# Patient Record
Sex: Male | Born: 1945 | Race: White | Hispanic: No | Smoking: Never smoker
Health system: Southern US, Community
[De-identification: ages and names within clinical notes are randomized; demographics above are authoritative.]

## PROBLEM LIST (undated history)

## (undated) DIAGNOSIS — E782 Mixed hyperlipidemia: Secondary | ICD-10-CM

## (undated) DIAGNOSIS — I10 Essential (primary) hypertension: Secondary | ICD-10-CM

## (undated) DIAGNOSIS — I272 Pulmonary hypertension, unspecified: Secondary | ICD-10-CM

## (undated) DIAGNOSIS — J45909 Unspecified asthma, uncomplicated: Secondary | ICD-10-CM

## (undated) DIAGNOSIS — G4733 Obstructive sleep apnea (adult) (pediatric): Secondary | ICD-10-CM

## (undated) DIAGNOSIS — I209 Angina pectoris, unspecified: Secondary | ICD-10-CM

## (undated) DIAGNOSIS — D869 Sarcoidosis, unspecified: Secondary | ICD-10-CM

## (undated) DIAGNOSIS — K5792 Diverticulitis of intestine, part unspecified, without perforation or abscess without bleeding: Secondary | ICD-10-CM

## (undated) DIAGNOSIS — Z789 Other specified health status: Secondary | ICD-10-CM

## (undated) DIAGNOSIS — K219 Gastro-esophageal reflux disease without esophagitis: Secondary | ICD-10-CM

## (undated) DIAGNOSIS — I2510 Atherosclerotic heart disease of native coronary artery without angina pectoris: Secondary | ICD-10-CM

## (undated) DIAGNOSIS — K5190 Ulcerative colitis, unspecified, without complications: Secondary | ICD-10-CM

## (undated) DIAGNOSIS — G2581 Restless legs syndrome: Secondary | ICD-10-CM

## (undated) DIAGNOSIS — Z87442 Personal history of urinary calculi: Secondary | ICD-10-CM

## (undated) DIAGNOSIS — I219 Acute myocardial infarction, unspecified: Secondary | ICD-10-CM

## (undated) DIAGNOSIS — M1990 Unspecified osteoarthritis, unspecified site: Secondary | ICD-10-CM

## (undated) DIAGNOSIS — I2699 Other pulmonary embolism without acute cor pulmonale: Secondary | ICD-10-CM

## (undated) HISTORY — DX: Other specified health status: Z78.9

## (undated) HISTORY — DX: Ulcerative colitis, unspecified, without complications: K51.90

## (undated) HISTORY — DX: Other pulmonary embolism without acute cor pulmonale: I26.99

## (undated) HISTORY — PX: APPENDECTOMY: SHX54

## (undated) HISTORY — DX: Sarcoidosis, unspecified: D86.9

## (undated) HISTORY — DX: Restless legs syndrome: G25.81

## (undated) HISTORY — DX: Obstructive sleep apnea (adult) (pediatric): G47.33

## (undated) HISTORY — PX: JOINT REPLACEMENT: SHX530

## (undated) HISTORY — DX: Atherosclerotic heart disease of native coronary artery without angina pectoris: I25.10

## (undated) HISTORY — PX: CARDIAC CATHETERIZATION: SHX172

## (undated) HISTORY — DX: Mixed hyperlipidemia: E78.2

## (undated) HISTORY — DX: Pulmonary hypertension, unspecified: I27.20

---

## 1968-08-21 HISTORY — PX: CHOLECYSTECTOMY: SHX55

## 1983-08-22 HISTORY — PX: OTHER SURGICAL HISTORY: SHX169

## 2003-08-22 HISTORY — PX: OTHER SURGICAL HISTORY: SHX169

## 2011-08-22 HISTORY — PX: LAPAROSCOPIC APPENDECTOMY: SUR753

## 2012-09-20 ENCOUNTER — Encounter: Payer: Self-pay | Admitting: Cardiology

## 2012-10-04 ENCOUNTER — Ambulatory Visit: Payer: Self-pay | Admitting: Cardiology

## 2012-10-10 ENCOUNTER — Encounter: Payer: Self-pay | Admitting: Cardiology

## 2012-10-10 ENCOUNTER — Encounter: Payer: Self-pay | Admitting: *Deleted

## 2012-10-10 ENCOUNTER — Ambulatory Visit (INDEPENDENT_AMBULATORY_CARE_PROVIDER_SITE_OTHER): Payer: Medicare Other | Admitting: Cardiology

## 2012-10-10 VITALS — BP 134/84 | HR 58 | Ht 72.0 in | Wt 224.0 lb

## 2012-10-10 DIAGNOSIS — I2510 Atherosclerotic heart disease of native coronary artery without angina pectoris: Secondary | ICD-10-CM | POA: Insufficient documentation

## 2012-10-10 DIAGNOSIS — E782 Mixed hyperlipidemia: Secondary | ICD-10-CM

## 2012-10-10 NOTE — Assessment & Plan Note (Signed)
History reviewed. He reports interval symptoms suggestive of progressive angina. ECG reviewed. Plan an exercise Myoview for ischemic evaluation on medical therapy. Can then determine next step.

## 2012-10-10 NOTE — Progress Notes (Signed)
Clinical Summary [name] is a 67 y.o.male referred for cardiology evaluation by Richard Webb. He was perviously followed by Richard Webb in [place], last seen July 2013. Cardiac history reviewed and noted below. He reports compliance with his medications, no nitroglycerine use. Works actively as a Theme park manager.  He reports intermittent, but more frequent and pronounced than in the last few years, exertional fatigue and breathlessness, particularly with inclines. Had limiting symptoms while shoveling snow last week.  ECG today shows sinus bradycardia, no significant ST changes compared to prior tracing from 10/11. Lipids reviewed from July 2013 with LDL 83, HDL 54, TG 79 and cholesterol 153.  He has not undergone any interval stress testing.  No Known Allergies  Current Outpatient Prescriptions  Medication Sig Dispense Refill  . acetaminophen (TYLENOL) 500 MG tablet Take 500 mg by mouth every 6 (six) hours as needed for pain.      Marland Kitchen ALPRAZolam (XANAX) 0.5 MG tablet Take 0.5 mg by mouth at bedtime as needed.      Marland Kitchen aspirin 81 MG tablet Take 81 mg by mouth daily.      . balsalazide (COLAZAL) 750 MG capsule Take 750 mg by mouth 3 (three) times daily.       . carbidopa-levodopa (SINEMET IR) 10-100 MG per tablet Take 1 tablet by mouth daily.       . clopidogrel (PLAVIX) 75 MG tablet Take 75 mg by mouth daily.      Marland Kitchen dicyclomine (BENTYL) 10 MG capsule Take 1 capsule by mouth 4 (four) times daily.      . diphenoxylate-atropine (LOMOTIL) 2.5-0.025 MG per tablet Take 1 tablet by mouth 4 (four) times daily as needed.      Marland Kitchen EPINEPHrine (EPIPEN JR) 0.15 MG/0.3ML injection Inject 0.15 mg into the muscle as needed.      . fluticasone (FLONASE) 50 MCG/ACT nasal spray Place 2 sprays into the nose daily.      . Fluticasone-Salmeterol (ADVAIR) 250-50 MCG/DOSE AEPB Inhale 1 puff into the lungs every 12 (twelve) hours.      . folic acid (FOLVITE) 712 MCG tablet Take 400 mcg by mouth daily.      Marland Kitchen gabapentin  (NEURONTIN) 300 MG capsule Take 300 mg by mouth 3 (three) times daily.      Marland Kitchen HYDROcodone-acetaminophen (VICODIN) 5-500 MG per tablet Take 1 tablet by mouth daily as needed.      Marland Kitchen ipratropium-albuterol (DUONEB) 0.5-2.5 (3) MG/3ML SOLN Take 3 mLs by nebulization 2 (two) times daily.      Marland Kitchen lisinopril (PRINIVIL,ZESTRIL) 10 MG tablet Take 10 mg by mouth daily.      . montelukast (SINGULAIR) 10 MG tablet Take 10 mg by mouth at bedtime.      . NON FORMULARY CPAP UAD      . simvastatin (ZOCOR) 40 MG tablet Take 40 mg by mouth every evening.      . zolpidem (AMBIEN) 10 MG tablet Take 0.5-1 tablets by mouth at bedtime.       No current facility-administered medications for this visit.    Past Medical History  Diagnosis Date  . Coronary atherosclerosis of native coronary artery     DES x 3 RCA and DES OM 10/11 - [place]  . Mixed hyperlipidemia   . Ulcerative colitis   . Obstructive sleep apnea   . Sarcoidosis   . Restless leg syndrome   . Pulmonary hypertension     PASP 50 mmHg    Past Surgical History  Procedure Laterality  Date  . Right total knee replacement  2005  . Cholecystectomy  1970  . Laparoscopic appendectomy  2013  . Ankle fracture repair  1985    Family History  Problem Relation Age of Onset  . Colon cancer Mother   . CAD Father     Social History Mr. Webb reports that he has never smoked. He does not have any smokeless tobacco history on file. Richard Webb reports that he does not drink alcohol.  Review of Systems No palpitations or syncope. No claudication. Recent cough, no fevers or chills. Otherwise negative.  Physical Examination Filed Vitals:   10/10/12 1431  BP: 134/84  Pulse: 58   Filed Weights   10/10/12 1431  Weight: 224 lb (101.606 kg)   No acute distress. HEENT: Conjunctiva and lids normal, oropharynx clear with moist mucosa. Neck: Supple, no elevated JVP or carotid bruits, no thyromegaly. Lungs: Clear to auscultation, nonlabored  breathing at rest. Cardiac: Regular rate and rhythm, no S3, soft systolic murmur, no pericardial rub. Abdomen: Soft, nontender, bowel sounds present, no guarding or rebound. Extremities: No pitting edema, distal pulses 2+. Skin: Warm and dry. Musculoskeletal: No kyphosis. Neuropsychiatric: Alert and oriented x3, affect grossly appropriate.   Problem List and Plan   Coronary atherosclerosis of native coronary artery History reviewed. He reports interval symptoms suggestive of progressive angina. ECG reviewed. Plan an exercise Myoview for ischemic evaluation on medical therapy. Can then determine next step.  Mixed hyperlipidemia Looks to be well controlled on statin therapy.    Richard Webb, M.D., F.A.C.C.

## 2012-10-10 NOTE — Assessment & Plan Note (Addendum)
Looks to be well controlled on statin therapy.

## 2012-10-10 NOTE — Patient Instructions (Addendum)
Your physician recommends that you schedule a follow-up appointment in: 6 months. You will receive a reminder letter in the mail in about 4 months reminding you to call and schedule your appointment. If you don't receive this letter, please contact our office. Your physician recommends that you continue on your current medications as directed. Please refer to the Current Medication list given to you today. Your physician has requested that you have en exercise stress myoview. For further information please visit [email or web address]. Please follow instruction sheet, as given.

## 2012-10-16 ENCOUNTER — Ambulatory Visit (HOSPITAL_COMMUNITY): Payer: Medicare Other | Attending: Cardiology | Admitting: Radiology

## 2012-10-16 VITALS — BP 123/78 | Ht 72.0 in | Wt 221.0 lb

## 2012-10-16 DIAGNOSIS — J45909 Unspecified asthma, uncomplicated: Secondary | ICD-10-CM | POA: Insufficient documentation

## 2012-10-16 DIAGNOSIS — R0789 Other chest pain: Secondary | ICD-10-CM | POA: Insufficient documentation

## 2012-10-16 DIAGNOSIS — R0989 Other specified symptoms and signs involving the circulatory and respiratory systems: Secondary | ICD-10-CM | POA: Insufficient documentation

## 2012-10-16 DIAGNOSIS — D869 Sarcoidosis, unspecified: Secondary | ICD-10-CM | POA: Insufficient documentation

## 2012-10-16 DIAGNOSIS — R0602 Shortness of breath: Secondary | ICD-10-CM | POA: Insufficient documentation

## 2012-10-16 DIAGNOSIS — I2510 Atherosclerotic heart disease of native coronary artery without angina pectoris: Secondary | ICD-10-CM

## 2012-10-16 DIAGNOSIS — I2789 Other specified pulmonary heart diseases: Secondary | ICD-10-CM | POA: Insufficient documentation

## 2012-10-16 DIAGNOSIS — E785 Hyperlipidemia, unspecified: Secondary | ICD-10-CM | POA: Insufficient documentation

## 2012-10-16 DIAGNOSIS — R0609 Other forms of dyspnea: Secondary | ICD-10-CM | POA: Insufficient documentation

## 2012-10-16 MED ORDER — TECHNETIUM TC 99M SESTAMIBI GENERIC - CARDIOLITE
30.0000 | Freq: Once | INTRAVENOUS | Status: AC | PRN
  Administered 2012-10-16: 30 via INTRAVENOUS

## 2012-10-16 MED ORDER — TECHNETIUM TC 99M SESTAMIBI GENERIC - CARDIOLITE
10.0000 | Freq: Once | INTRAVENOUS | Status: AC | PRN
  Administered 2012-10-16: 10 via INTRAVENOUS

## 2012-10-16 NOTE — Progress Notes (Signed)
[facility] [address] [place], [place] [address] [phone]    Cardiology Nuclear Med Study  Richard Webb is a 67 y.o. male     MRN : [id number]     DOB: Apr 06, 1946  Procedure Date: 10/16/2012  Nuclear Med Background Indication for Stress Test:  Evaluation for Ischemia and Stent Patency History:  Asthma and '11 Heart Cath-Stent-RCAx3/OMx1, Sarcoidois, Pulmonary HTN Cardiac Risk Factors: Lipids  Symptoms:  Chest Pain, DOE, Fatigue and SOB   Nuclear Pre-Procedure Caffeine/Decaff Intake:  None NPO After: 6:00pm   Lungs:  clear O2 Sat: 98% on room air. IV 0.9% NS with Angio Cath:  20g  IV Site: R Hand  IV Started by:  Annye Rusk, CNMT  Chest Size (in):  48 Cup Size: n/a  Height: 6' (1.829 m)  Weight:  221 lb (100.245 kg)  BMI:  Body mass index is 29.97 kg/(m^2). Tech Comments:  Took all am meds    Nuclear Med Study 1 or 2 day study: 1 day  Stress Test Type:  Stress  Reading MD: Darlin Coco, MD  Order Authorizing Provider:  Myles Gip, MD  Resting Radionuclide: Technetium 79mSestamibi  Resting Radionuclide Dose: 11.0 mCi   Stress Radionuclide:  Technetium 975mestamibi  Stress Radionuclide Dose: 33.0 mCi           Stress Protocol Rest HR: 56 Stress HR: 141  Rest BP: 123/78 Stress BP: 197/70  Exercise Time (min): 5:45 METS: 7.0   Predicted Max HR: 154 bpm % Max HR: 91.56 bpm Rate Pressure Product: 27777   Dose of Adenosine (mg):  n/a Dose of Lexiscan: n/a mg  Dose of Atropine (mg): n/a Dose of Dobutamine: n/a mcg/kg/min (at max HR)  Stress Test Technologist: SaPerrin MalteseEMT-P  Nuclear Technologist:  StCharlton AmorCNMT     Rest Procedure:  Myocardial perfusion imaging was performed at rest 45 minutes following the intravenous administration of Technetium 9933mstamibi. Rest ECG: NSR - Normal EKG  Stress Procedure:  The patient exercised on the treadmill utilizing the Bruce Protocol for 5:45 minutes. The patient  stopped due to sob, fatigue, and denied any chest pain.  Technetium 22m12mtamibi was injected at peak exercise and myocardial perfusion imaging was performed after a brief delay. Stress ECG: No significant change from baseline ECG  QPS Raw Data Images:  Normal; no motion artifact; normal heart/lung ratio. Stress Images:  Normal homogeneous uptake in all areas of the myocardium. Rest Images:  Normal homogeneous uptake in all areas of the myocardium. Subtraction (SDS):  No evidence of ischemia. Transient Ischemic Dilatation (Normal <1.22):  0.98 Lung/Heart Ratio (Normal <0.45):  0.19  Quantitative Gated Spect Images QGS EDV:  100 ml QGS ESV:  42 ml  Impression Exercise Capacity:  Fair exercise capacity. BP Response:  Normal blood pressure response. Clinical Symptoms:  No chest pain. ECG Impression:  No significant ST segment change suggestive of ischemia. Comparison with Prior Nuclear Study: No images to compare  Overall Impression:  Normal stress nuclear study.  LV Ejection Fraction: 58%.  LV Wall Motion:  NL LV Function; NL Wall Motion  [employer]

## 2012-10-18 ENCOUNTER — Ambulatory Visit: Payer: Self-pay | Admitting: Cardiology

## 2012-10-18 ENCOUNTER — Telehealth: Payer: Self-pay | Admitting: *Deleted

## 2012-10-18 NOTE — Telephone Encounter (Signed)
Patient informed. 

## 2012-10-18 NOTE — Telephone Encounter (Signed)
Message copied by Merlene Laughter on Fri Oct 18, 2012  4:24 PM ------      Message from: MCDOWELL, Aloha Gell      Created: Thu Oct 17, 2012  9:19 AM       Conclusions of study copied below. No evidence of ischemia, argues against any major progression in underlying CAD. Please let him know. Can continue observation on medical therapy for now, although if his symptoms progress, we should see him back and discuss other options.            Impression      Exercise Capacity:  Fair exercise capacity.      BP Response:  Normal blood pressure response.      Clinical Symptoms:  No chest pain.      ECG Impression:  No significant ST segment change suggestive of ischemia.      Comparison with Prior Nuclear Study: No images to compare            Overall Impression:  Normal stress nuclear study.            LV Ejection Fraction: 58%.  LV Wall Motion:  NL LV Function; NL Wall Motion            Darlin Coco       ------

## 2012-10-21 ENCOUNTER — Encounter: Payer: Self-pay | Admitting: Cardiology

## 2012-12-30 ENCOUNTER — Telehealth: Payer: Self-pay | Admitting: Cardiology

## 2012-12-30 NOTE — Telephone Encounter (Signed)
Patient saw orthopedic MD whom was Dr. Case in [place] for joint pain. Dr. Case requested patient notify his cardiologist to see if it was okay for him to start celebrex. Please advise.

## 2012-12-30 NOTE — Telephone Encounter (Signed)
PATIENT would like to know if he can start on celebrex

## 2012-12-30 NOTE — Telephone Encounter (Signed)
Chart reviewed. Pt cleared to proceed with treatment with Celebrex.

## 2012-12-31 NOTE — Telephone Encounter (Signed)
Patient informed. 

## 2013-03-07 ENCOUNTER — Encounter: Payer: Self-pay | Admitting: Cardiology

## 2013-03-07 ENCOUNTER — Ambulatory Visit (INDEPENDENT_AMBULATORY_CARE_PROVIDER_SITE_OTHER): Payer: Medicare Other | Admitting: Cardiology

## 2013-03-07 VITALS — BP 116/79 | HR 78 | Ht 72.0 in | Wt 222.0 lb

## 2013-03-07 DIAGNOSIS — I2510 Atherosclerotic heart disease of native coronary artery without angina pectoris: Secondary | ICD-10-CM

## 2013-03-07 DIAGNOSIS — E782 Mixed hyperlipidemia: Secondary | ICD-10-CM

## 2013-03-07 MED ORDER — NITROGLYCERIN 0.4 MG SL SUBL: 0.4000 mg | SUBLINGUAL_TABLET | SUBLINGUAL | Status: DC | PRN

## 2013-03-07 NOTE — Assessment & Plan Note (Signed)
Has had intolerance to statins. He is trying red yeast rice and will be following up with Dr. Manuella Ghazi soon.

## 2013-03-07 NOTE — Assessment & Plan Note (Signed)
Continues to experience dyspnea on exertion, some apparent angina symptoms. His Myoview from last year was reassuring. We discussed options today, plan will be to continue observation with closer office followup, prescription for sublingual nitroglycerin also given. Certainly if he notices an escalating pattern in his symptoms, would consider a followup cardiac catheterization. He will continue on DAPT with history of multiple stents in 2011.

## 2013-03-07 NOTE — Progress Notes (Signed)
Clinical Summary Mr. Bedwell is a 67 y.o.male last seen in February. He reports compliance with his medications, still works as actively as a Company secretary. He continues to describe dyspnea on exertion, perhaps some angina with increased levels of activity. We discussed this quite a bit today, he does not relate any definite progression in symptoms however. He has not tried nitroglycerin.  Exercise Myoview from February of this year demonstrated no abnormal ST segment changes, normal perfusion, LVEF 58%. We reviewed this.  He has not been able to tolerate statins, now taking red yeast rice. Followup scheduled with Dr. Manuella Ghazi.  He continues CPAP at nighttime. Still has daytime somnolence.   Allergies  Allergen Reactions  . Crestor (Rosuvastatin) Other (See Comments)    myalgia  . Simvastatin Other (See Comments)    myalgia    Current Outpatient Prescriptions  Medication Sig Dispense Refill  . acetaminophen (TYLENOL) 500 MG tablet Take 500 mg by mouth every 6 (six) hours as needed for pain.      Marland Kitchen ALPRAZolam (XANAX) 0.5 MG tablet Take 0.5 mg by mouth at bedtime as needed.      Marland Kitchen aspirin 81 MG tablet Take 81 mg by mouth daily.      . balsalazide (COLAZAL) 750 MG capsule Take 2,250 mg by mouth 3 (three) times daily.       . carbidopa-levodopa (SINEMET IR) 10-100 MG per tablet Take 1 tablet by mouth daily.       . clopidogrel (PLAVIX) 75 MG tablet Take 75 mg by mouth daily.      Marland Kitchen dicyclomine (BENTYL) 10 MG capsule Take 1 capsule by mouth 4 (four) times daily as needed.       . diphenoxylate-atropine (LOMOTIL) 2.5-0.025 MG per tablet Take 1 tablet by mouth 4 (four) times daily as needed.      Marland Kitchen EPINEPHrine (EPIPEN JR) 0.15 MG/0.3ML injection Inject 0.15 mg into the muscle as needed.      . fluticasone (FLONASE) 50 MCG/ACT nasal spray Place 2 sprays into the nose daily.      . Fluticasone-Salmeterol (ADVAIR) 250-50 MCG/DOSE AEPB Inhale 1 puff into the lungs every 12 (twelve) hours.      .  folic acid (FOLVITE) 081 MCG tablet Take 400 mcg by mouth daily.      Marland Kitchen gabapentin (NEURONTIN) 300 MG capsule Take 300 mg by mouth 3 (three) times daily.      Marland Kitchen HYDROcodone-acetaminophen (VICODIN) 5-500 MG per tablet Take 1 tablet by mouth daily as needed.      Marland Kitchen ipratropium-albuterol (DUONEB) 0.5-2.5 (3) MG/3ML SOLN Take 3 mLs by nebulization 2 (two) times daily.      Marland Kitchen levalbuterol (XOPENEX HFA) 45 MCG/ACT inhaler Inhale 2 puffs into the lungs every 6 (six) hours as needed for wheezing.      Marland Kitchen lisinopril (PRINIVIL,ZESTRIL) 10 MG tablet Take 10 mg by mouth daily.      . methocarbamol (ROBAXIN) 500 MG tablet Take 1 tablet by mouth 2 (two) times daily.      . montelukast (SINGULAIR) 10 MG tablet Take 10 mg by mouth at bedtime.      . NON FORMULARY CPAP UAD      . Red Yeast Rice 600 MG CAPS Take 1,200 mg by mouth daily.      Marland Kitchen zolpidem (AMBIEN) 10 MG tablet Take 10 mg by mouth at bedtime as needed.       . nitroGLYCERIN (NITROSTAT) 0.4 MG SL tablet Place 1 tablet (0.4 mg total) under  the tongue every 5 (five) minutes as needed for chest pain.  90 tablet  3   No current facility-administered medications for this visit.    Past Medical History  Diagnosis Date  . Coronary atherosclerosis of native coronary artery     DES x 3 RCA and DES OM 10/11 - [place]  . Mixed hyperlipidemia   . Ulcerative colitis   . Obstructive sleep apnea   . Sarcoidosis   . Restless leg syndrome   . Pulmonary hypertension     PASP 50 mmHg    Social History Mr. Blyden reports that he has never smoked. He has never used smokeless tobacco. Mr. Hyson reports that he does not drink alcohol.  Review of Systems No palpitations, dizziness, syncope. Occasional bruising, no major bleeding problems. He has been trying to lose some weight through diet. States he has lost about 6 or 8 pounds. Otherwise negative.  Physical Examination Filed Vitals:   03/07/13 1302  BP: 116/79  Pulse: 78   Filed Weights    03/07/13 1302  Weight: 222 lb (100.699 kg)    Comfortable at rest. HEENT: Conjunctiva and lids normal, oropharynx clear with moist mucosa.  Neck: Supple, no elevated JVP or carotid bruits, no thyromegaly.  Lungs: Clear to auscultation, nonlabored breathing at rest.  Cardiac: Regular rate and rhythm, no S3, soft systolic murmur, no pericardial rub.  Abdomen: Soft, nontender, bowel sounds present, no guarding or rebound.  Extremities: No pitting edema, distal pulses 2+.  Skin: Warm and dry.  Musculoskeletal: No kyphosis.  Neuropsychiatric: Alert and oriented x3, affect grossly appropriate.   Problem List and Plan   Coronary atherosclerosis of native coronary artery Continues to experience dyspnea on exertion, some apparent angina symptoms. His Myoview from last year was reassuring. We discussed options today, plan will be to continue observation with closer office followup, prescription for sublingual nitroglycerin also given. Certainly if he notices an escalating pattern in his symptoms, would consider a followup cardiac catheterization. He will continue on DAPT with history of multiple stents in 2011.  Mixed hyperlipidemia Has had intolerance to statins. He is trying red yeast rice and will be following up with Dr. Manuella Ghazi soon.    Satira Sark, M.D., F.A.C.C.

## 2013-03-07 NOTE — Patient Instructions (Addendum)
Your physician recommends that you schedule a follow-up appointment in: 3 months. You will receive a reminder letter in the mail in about 1-2 months reminding you to call and schedule your appointment. If you don't receive this letter, please contact our office. Your physician recommends that you continue on your current medications as directed. Please refer to the Current Medication list given to you today. Dissolve one under tongue for chest pain every 5 minutes up to 3 doses. If no relief, proceed to ED.

## 2013-03-26 ENCOUNTER — Other Ambulatory Visit: Payer: Self-pay

## 2013-06-26 ENCOUNTER — Other Ambulatory Visit: Payer: Self-pay

## 2014-04-16 ENCOUNTER — Encounter: Payer: Self-pay | Admitting: Cardiology

## 2014-04-16 ENCOUNTER — Ambulatory Visit (INDEPENDENT_AMBULATORY_CARE_PROVIDER_SITE_OTHER): Payer: Medicare Other | Admitting: Cardiology

## 2014-04-16 VITALS — BP 125/75 | HR 72 | Ht 72.0 in | Wt 211.0 lb

## 2014-04-16 DIAGNOSIS — I2510 Atherosclerotic heart disease of native coronary artery without angina pectoris: Secondary | ICD-10-CM

## 2014-04-16 DIAGNOSIS — E782 Mixed hyperlipidemia: Secondary | ICD-10-CM

## 2014-04-16 NOTE — Patient Instructions (Signed)
Your physician recommends that you schedule a follow-up appointment in: 6 months. You will receive a reminder letter in the mail in about 4 months reminding you to call and schedule your appointment. If you don't receive this letter, please contact our office. Your physician has recommended you make the following change in your medication:  Stop plavix. Continue all other medications the same.

## 2014-04-16 NOTE — Progress Notes (Signed)
Clinical Summary Richard Webb is a 68 y.o.male recently seen in July. He presents today to discuss his Plavix. He has been on this long-term with history of multiple stents placed back in 2011 in [place]. He is reporting problems with easy bruising and bleeding when he scratches his skin, stopped the Plavix and the symptoms improved. We discussed the pros and cons of dual antiplatelet therapy, he is certainly over one year out, has not had any acute event since then.  He has also had some episodes of intermittent dizziness, usually when standing, told that he was noted to be mildly orthostatic by Dr. Manuella Ghazi, and the dose of lisinopril was cut in half. He is starting to feel better. He has also been watching his diet and his weight has come down nicely. He is actually 10 pounds down from his last visit in July. Recent lipid panel showed cholesterol 148, triglycerides 101, HDL 40, and LDL 88.  Exercise Myoview from February of this year demonstrated no abnormal ST segment changes, normal perfusion, LVEF 58%.   Allergies  Allergen Reactions  . Crestor [Rosuvastatin] Other (See Comments)    myalgia  . Simvastatin Other (See Comments)    myalgia    Current Outpatient Prescriptions  Medication Sig Dispense Refill  . acetaminophen (TYLENOL) 500 MG tablet Take 500 mg by mouth every 6 (six) hours as needed for pain.      Marland Kitchen aspirin 81 MG tablet Take 81 mg by mouth daily.      . carbidopa-levodopa (SINEMET IR) 10-100 MG per tablet Take 1 tablet by mouth as needed.       . dicyclomine (BENTYL) 10 MG capsule Take 1 capsule by mouth 4 (four) times daily as needed.       . diphenoxylate-atropine (LOMOTIL) 2.5-0.025 MG per tablet Take 1 tablet by mouth as needed.       Marland Kitchen EPINEPHrine (EPIPEN JR) 0.15 MG/0.3ML injection Inject 0.15 mg into the muscle as needed.      . Fluticasone-Salmeterol (ADVAIR) 250-50 MCG/DOSE AEPB Inhale 1 puff into the lungs every 12 (twelve) hours.      Marland Kitchen  HYDROcodone-acetaminophen (VICODIN) 5-500 MG per tablet Take 1 tablet by mouth daily as needed.      Marland Kitchen ipratropium-albuterol (DUONEB) 0.5-2.5 (3) MG/3ML SOLN Take 3 mLs by nebulization 2 (two) times daily.      Marland Kitchen lisinopril (PRINIVIL,ZESTRIL) 10 MG tablet Take 10 mg by mouth daily.      . mesalamine (LIALDA) 1.2 G EC tablet Take 1.2 g by mouth 2 (two) times daily.      . methocarbamol (ROBAXIN) 500 MG tablet Take 1 tablet by mouth 2 (two) times daily.      . montelukast (SINGULAIR) 10 MG tablet Take 10 mg by mouth at bedtime.      . nitroGLYCERIN (NITROSTAT) 0.4 MG SL tablet Place 1 tablet (0.4 mg total) under the tongue every 5 (five) minutes as needed for chest pain.  90 tablet  3  . NON FORMULARY CPAP UAD      . temazepam (RESTORIL) 15 MG capsule Take 15 mg by mouth daily.      . Vitamin D, Ergocalciferol, (DRISDOL) 50000 UNITS CAPS capsule Take 50,000 Units by mouth every 7 (seven) days.      Marland Kitchen levalbuterol (XOPENEX HFA) 45 MCG/ACT inhaler Inhale 2 puffs into the lungs every 6 (six) hours as needed for wheezing.       No current facility-administered medications for this visit.  Past Medical History  Diagnosis Date  . Coronary atherosclerosis of native coronary artery     DES x 3 RCA and DES OM 10/11 - [place]  . Mixed hyperlipidemia   . Ulcerative colitis   . Obstructive sleep apnea   . Sarcoidosis   . Restless leg syndrome   . Pulmonary hypertension     PASP 50 mmHg    Social History Richard Webb reports that he has never smoked. He has never used smokeless tobacco. Richard Webb reports that he does not drink alcohol.  Review of Systems Other systems reviewed and negative except as outlined.  Physical Examination Filed Vitals:   04/16/14 0940  BP: 125/75  Pulse: 72   Filed Weights   04/16/14 0940  Weight: 211 lb (95.709 kg)    Comfortable at rest.  HEENT: Conjunctiva and lids normal, oropharynx clear with moist mucosa.  Neck: Supple, no elevated JVP or  carotid bruits, no thyromegaly.  Lungs: Clear to auscultation, nonlabored breathing at rest.  Cardiac: Regular rate and rhythm, no S3, soft systolic murmur, no pericardial rub.  Abdomen: Soft, nontender, bowel sounds present, no guarding or rebound.  Extremities: No pitting edema, distal pulses 2+.  Skin: Warm and dry.  Musculoskeletal: No kyphosis.  Neuropsychiatric: Alert and oriented x3, affect grossly appropriate.   Problem List and Plan   Coronary atherosclerosis of native coronary artery Plan to continue medical therapy and observation, low risk Cardiolite done earlier in the year. He actually feels better after losing weight in terms of his shortness of breath. No angina symptoms. Will stop Plavix at this time continue aspirin. No other changes made.  Mixed hyperlipidemia Recent lipid numbers reviewed, LDL 88.    Satira Sark, M.D., F.A.C.C.

## 2014-04-16 NOTE — Assessment & Plan Note (Signed)
Plan to continue medical therapy and observation, low risk Cardiolite done earlier in the year. He actually feels better after losing weight in terms of his shortness of breath. No angina symptoms. Will stop Plavix at this time continue aspirin. No other changes made.

## 2014-04-16 NOTE — Assessment & Plan Note (Signed)
Recent lipid numbers reviewed, LDL 88.

## 2014-11-27 ENCOUNTER — Ambulatory Visit: Payer: Medicare Other | Admitting: Cardiology

## 2014-12-07 ENCOUNTER — Encounter: Payer: Self-pay | Admitting: Cardiology

## 2014-12-07 ENCOUNTER — Ambulatory Visit (INDEPENDENT_AMBULATORY_CARE_PROVIDER_SITE_OTHER): Payer: Medicare Other | Admitting: Cardiology

## 2014-12-07 VITALS — BP 122/62 | HR 76 | Ht 72.0 in | Wt 214.0 lb

## 2014-12-07 DIAGNOSIS — E782 Mixed hyperlipidemia: Secondary | ICD-10-CM | POA: Diagnosis not present

## 2014-12-07 DIAGNOSIS — I2510 Atherosclerotic heart disease of native coronary artery without angina pectoris: Secondary | ICD-10-CM | POA: Diagnosis not present

## 2014-12-07 NOTE — Patient Instructions (Signed)
Continue all current medications. Your physician wants you to follow up in: 6 months.  You will receive a reminder letter in the mail one-two months in advance.  If you don't receive a letter, please call our office to schedule the follow up appointment   

## 2014-12-07 NOTE — Progress Notes (Signed)
Cardiology Office Note  Date: 12/07/2014   ID: Richard Webb, DOB 12-10-1945, MRN [id number]  PCP: [name], MD  Primary Cardiologist: Rozann Lesches, MD   Chief Complaint  Patient presents with  . Coronary Artery Disease  . Hyperlipidemia    History of Present Illness: Richard Webb is a 69 y.o. male last seen in August 2015. He presents for a routine follow-up visit. Interval history includes left knee replacement toward the end of last year. He has been able to ambulate more easily, although still has trouble with chronic back pain. He does not report any angina symptoms or nitroglycerin use. We reviewed his medications which are unchanged from a cardiac perspective. Ischemic testing from last year is noted below. Will be seeing Dr. Manuella Ghazi for a physical soon   Past Medical History  Diagnosis Date  . Coronary atherosclerosis of native coronary artery     DES x 3 RCA and DES OM 10/11 - [place]  . Mixed hyperlipidemia   . Ulcerative colitis   . Obstructive sleep apnea   . Sarcoidosis   . Restless leg syndrome   . Pulmonary hypertension     PASP 50 mmHg    Current Outpatient Prescriptions  Medication Sig Dispense Refill  . aspirin 81 MG tablet Take 81 mg by mouth daily.    . carbidopa-levodopa (SINEMET IR) 10-100 MG per tablet Take 1 tablet by mouth as needed.     . dicyclomine (BENTYL) 10 MG capsule Take 1 capsule by mouth 4 (four) times daily as needed.     . diphenoxylate-atropine (LOMOTIL) 2.5-0.025 MG per tablet Take 1 tablet by mouth as needed.     Marland Kitchen EPINEPHrine (EPIPEN JR) 0.15 MG/0.3ML injection Inject 0.15 mg into the muscle as needed.    . Fluticasone-Salmeterol (ADVAIR) 250-50 MCG/DOSE AEPB Inhale 1 puff into the lungs every 12 (twelve) hours.    Marland Kitchen HYDROcodone-acetaminophen (VICODIN) 5-500 MG per tablet Take 1 tablet by mouth daily as needed.    . Ibuprofen (ADVIL PO) Take 800 mg by mouth daily.    Marland Kitchen ipratropium-albuterol (DUONEB) 0.5-2.5 (3) MG/3ML  SOLN Take 3 mLs by nebulization 2 (two) times daily.    Marland Kitchen levalbuterol (XOPENEX HFA) 45 MCG/ACT inhaler Inhale 2 puffs into the lungs every 6 (six) hours as needed for wheezing.    Marland Kitchen lisinopril (PRINIVIL,ZESTRIL) 10 MG tablet Take 10 mg by mouth daily.    . mesalamine (LIALDA) 1.2 G EC tablet Take 1.2 g by mouth 2 (two) times daily.    . montelukast (SINGULAIR) 10 MG tablet Take 10 mg by mouth at bedtime.    . nitroGLYCERIN (NITROSTAT) 0.4 MG SL tablet Place 1 tablet (0.4 mg total) under the tongue every 5 (five) minutes as needed for chest pain. 90 tablet 3  . NON FORMULARY CPAP UAD    . Sulfamethoxazole-Trimethoprim (BACTRIM PO) Take 800 mg by mouth 2 (two) times daily.     . Tamsulosin HCl (FLOMAX PO) Take 0.4 mg by mouth daily.     . temazepam (RESTORIL) 15 MG capsule Take 15 mg by mouth daily.    . Vitamin D, Ergocalciferol, (DRISDOL) 50000 UNITS CAPS capsule Take 50,000 Units by mouth every 7 (seven) days.     No current facility-administered medications for this visit.    Allergies:  Crestor and Simvastatin   Social History: The patient  reports that he has never smoked. He has never used smokeless tobacco. He reports that he does not drink alcohol or use illicit drugs.  ROS:  Please see the history of present illness. Otherwise, complete review of systems is positive for eczema.  All other systems are reviewed and negative.   Physical Exam: VS:  BP 122/62 mmHg  Pulse 76  Ht 6' (1.829 m)  Wt 214 lb (97.07 kg)  BMI 29.02 kg/m2  SpO2 97%, BMI Body mass index is 29.02 kg/(m^2).  Wt Readings from Last 3 Encounters:  12/07/14 214 lb (97.07 kg)  04/16/14 211 lb (95.709 kg)  03/07/13 222 lb (100.699 kg)     Comfortable at rest.  HEENT: Conjunctiva and lids normal, oropharynx clear with moist mucosa.  Neck: Supple, no elevated JVP or carotid bruits, no thyromegaly.  Lungs: Clear to auscultation, nonlabored breathing at rest.  Cardiac: Regular rate and rhythm, no S3, soft  systolic murmur, no pericardial rub.  Abdomen: Soft, nontender, bowel sounds present, no guarding or rebound.  Extremities: No pitting edema, distal pulses 2+.  Skin: Warm and dry. Scattered eczema lesions. Musculoskeletal: No kyphosis.  Neuropsychiatric: Alert and oriented x3, affect grossly appropriate.  ECG: ECG is not ordered today.   Recent Labwork:  Lab work from last year showed cholesterol 148, triglycerides 101, HDL 40, and LDL 88.  Other Studies Reviewed Today:  Exercise Myoview from February 2015 demonstrated no abnormal ST segment changes, normal perfusion, LVEF 58%.  Assessment and Plan:  1. Symptomatically stable CAD status post DES to the RCA and OM as outlined above. He will continue medical therapy and observation for now.  2. Mixed hyperlipidemia, LDL 88 last year. He will be following up with Dr. Manuella Ghazi soon for repeat assessment.  Current medicines were reviewed with the patient today.   Disposition: FU with me in 6 months.   Signed, Satira Sark, MD, [facility] 12/07/2014 2:46 PM    [place] at [place], [place], [place] [address] Phone: [phone]; Fax: [phone]

## 2015-07-12 ENCOUNTER — Encounter: Payer: Self-pay | Admitting: Cardiology

## 2015-07-12 ENCOUNTER — Encounter: Payer: Self-pay | Admitting: *Deleted

## 2015-07-12 ENCOUNTER — Ambulatory Visit (INDEPENDENT_AMBULATORY_CARE_PROVIDER_SITE_OTHER): Payer: Medicare Other | Admitting: Cardiology

## 2015-07-12 VITALS — BP 138/90 | HR 61 | Ht 72.0 in | Wt 225.0 lb

## 2015-07-12 DIAGNOSIS — E782 Mixed hyperlipidemia: Secondary | ICD-10-CM

## 2015-07-12 DIAGNOSIS — I2510 Atherosclerotic heart disease of native coronary artery without angina pectoris: Secondary | ICD-10-CM | POA: Diagnosis not present

## 2015-07-12 NOTE — Patient Instructions (Signed)
Your physician recommends that you continue on your current medications as directed. Please refer to the Current Medication list given to you today. Your physician recommends that you schedule a follow-up appointment in: 6 months. You will receive a reminder letter in the mail in about 4 months reminding you to call and schedule your appointment. If you don't receive this letter, please contact our office. 

## 2015-07-12 NOTE — Progress Notes (Signed)
Cardiology Office Note  Date: 07/12/2015   ID: Richard Webb, DOB 1945/11/02, MRN [id number]  PCP: [name], MD  Primary Cardiologist: Rozann Lesches, MD   Chief Complaint  Patient presents with  . Coronary Artery Disease    History of Present Illness: Richard Webb is a 69 y.o. male last seen in April. He presents for a routine cardiac visit. Since last evaluation he does not report any angina symptoms or nitroglycerin requirement. He has been more short of breath, but states that he has gained about 10-15 pounds which could be contributing.  ECG today shows sinus rhythm with possible old inferior infarct pattern.  He indicates that he had lab work done recently with Dr. Manuella Ghazi, results to be requested. He is not on statin therapy with prior intolerances to Crestor and simvastatin. Previous LDL had been down in the 80s.  Last ischemic evaluation was in 2015, overall low risk as detailed below.   Past Medical History  Diagnosis Date  . Coronary atherosclerosis of native coronary artery     DES x 3 RCA and DES OM 10/11 - [place]  . Mixed hyperlipidemia   . Ulcerative colitis ([place])   . Obstructive sleep apnea   . Sarcoidosis ([place])   . Restless leg syndrome   . Pulmonary hypertension (HCC)     PASP 50 mmHg    Current Outpatient Prescriptions  Medication Sig Dispense Refill  . aspirin 81 MG tablet Take 81 mg by mouth daily.    . carbidopa-levodopa (SINEMET IR) 10-100 MG per tablet Take 1 tablet by mouth as needed.     . dicyclomine (BENTYL) 10 MG capsule Take 1 capsule by mouth 4 (four) times daily as needed.     . diphenoxylate-atropine (LOMOTIL) 2.5-0.025 MG per tablet Take 1 tablet by mouth as needed.     Marland Kitchen EPINEPHrine (EPIPEN JR) 0.15 MG/0.3ML injection Inject 0.15 mg into the muscle as needed.    . Fluticasone-Salmeterol (ADVAIR) 250-50 MCG/DOSE AEPB Inhale 1 puff into the lungs every 12 (twelve) hours.    Marland Kitchen HYDROcodone-acetaminophen (VICODIN) 5-500 MG per  tablet Take 1 tablet by mouth daily as needed.    . Ibuprofen (ADVIL PO) Take 800 mg by mouth daily.    Marland Kitchen ipratropium-albuterol (DUONEB) 0.5-2.5 (3) MG/3ML SOLN Take 3 mLs by nebulization 2 (two) times daily.    Marland Kitchen levalbuterol (XOPENEX HFA) 45 MCG/ACT inhaler Inhale 2 puffs into the lungs every 6 (six) hours as needed for wheezing.    Marland Kitchen losartan (COZAAR) 25 MG tablet Take 25 mg by mouth daily.    . meloxicam (MOBIC) 15 MG tablet Take 15 mg by mouth daily.    . mesalamine (LIALDA) 1.2 G EC tablet Take 1.2 g by mouth 2 (two) times daily.    . montelukast (SINGULAIR) 10 MG tablet Take 10 mg by mouth at bedtime.    . nitroGLYCERIN (NITROSTAT) 0.4 MG SL tablet Place 1 tablet (0.4 mg total) under the tongue every 5 (five) minutes as needed for chest pain. 90 tablet 3  . NON FORMULARY CPAP UAD    . omeprazole (PRILOSEC) 20 MG capsule Take 20 mg by mouth daily.    . solifenacin (VESICARE) 5 MG tablet Take 5 mg by mouth daily.    . Tamsulosin HCl (FLOMAX PO) Take 0.4 mg by mouth daily.     . temazepam (RESTORIL) 15 MG capsule Take 30 mg by mouth at bedtime as needed.     . Vitamin D, Ergocalciferol, (DRISDOL) 50000 UNITS  CAPS capsule Take 50,000 Units by mouth every 7 (seven) days.     No current facility-administered medications for this visit.    Allergies:  Crestor and Simvastatin   Social History: The patient  reports that he has never smoked. He has never used smokeless tobacco. He reports that he does not drink alcohol or use illicit drugs.   ROS:  Please see the history of present illness. Otherwise, complete review of systems is positive for chronic back pain, has had some steroid injections.  All other systems are reviewed and negative.   Physical Exam: VS:  BP 138/90 mmHg  Pulse 61  Ht 6' (1.829 m)  Wt 225 lb (102.059 kg)  BMI 30.51 kg/m2  SpO2 98%, BMI Body mass index is 30.51 kg/(m^2).  Wt Readings from Last 3 Encounters:  07/12/15 225 lb (102.059 kg)  12/07/14 214 lb (97.07  kg)  04/16/14 211 lb (95.709 kg)     Comfortable at rest.  HEENT: Conjunctiva and lids normal, oropharynx clear with moist mucosa.  Neck: Supple, no elevated JVP or carotid bruits, no thyromegaly.  Lungs: Clear to auscultation, nonlabored breathing at rest.  Cardiac: Regular rate and rhythm, no S3, soft systolic murmur, no pericardial rub.  Abdomen: Soft, nontender, bowel sounds present, no guarding or rebound.  Extremities: No pitting edema, distal pulses 2+.    ECG: ECG is ordered today.  Other Studies Reviewed Today:  Exercise Myoview from February 2015 demonstrated no abnormal ST segment changes, normal perfusion, LVEF 58%.  Assessment and Plan:  1. CAD status post previous DES interventions to the RCA and OM back in 2011, low risk ischemic testing noted from 2015. He does not report any angina symptoms or nitroglycerin use. Has been somewhat more short of breath with activity, but has gained 10-15 pounds as well. He will work on weight loss, continue medical therapy. If symptoms do not improve, we can evaluate further.  2. History of hyperlipidemia with statin intolerance, specifically Crestor and Zocor. Requesting was recent lab work from Dr. Brigitte Pulse for review.  Current medicines were reviewed with the patient today.   Orders Placed This Encounter  Procedures  . EKG 12-Lead    Disposition: FU with me in 6 months.   Signed, Satira Sark, MD, [facility] 07/12/2015 8:32 AM    [facility] at [place], [place], [place] [address] Phone: [phone]; Fax: [phone]

## 2015-08-25 DIAGNOSIS — M5416 Radiculopathy, lumbar region: Secondary | ICD-10-CM | POA: Diagnosis not present

## 2015-08-25 DIAGNOSIS — R202 Paresthesia of skin: Secondary | ICD-10-CM | POA: Diagnosis not present

## 2015-08-26 DIAGNOSIS — J45909 Unspecified asthma, uncomplicated: Secondary | ICD-10-CM | POA: Diagnosis not present

## 2015-08-26 DIAGNOSIS — D869 Sarcoidosis, unspecified: Secondary | ICD-10-CM | POA: Diagnosis not present

## 2015-08-26 DIAGNOSIS — G4733 Obstructive sleep apnea (adult) (pediatric): Secondary | ICD-10-CM | POA: Diagnosis not present

## 2015-08-27 DIAGNOSIS — R3915 Urgency of urination: Secondary | ICD-10-CM | POA: Diagnosis not present

## 2015-09-02 DIAGNOSIS — K5150 Left sided colitis without complications: Secondary | ICD-10-CM | POA: Diagnosis not present

## 2015-09-02 DIAGNOSIS — R102 Pelvic and perineal pain: Secondary | ICD-10-CM | POA: Diagnosis not present

## 2015-09-09 DIAGNOSIS — Z789 Other specified health status: Secondary | ICD-10-CM | POA: Diagnosis not present

## 2015-09-09 DIAGNOSIS — Z6830 Body mass index (BMI) 30.0-30.9, adult: Secondary | ICD-10-CM | POA: Diagnosis not present

## 2015-09-09 DIAGNOSIS — L508 Other urticaria: Secondary | ICD-10-CM | POA: Diagnosis not present

## 2015-09-16 DIAGNOSIS — H524 Presbyopia: Secondary | ICD-10-CM | POA: Diagnosis not present

## 2015-09-16 DIAGNOSIS — H2513 Age-related nuclear cataract, bilateral: Secondary | ICD-10-CM | POA: Diagnosis not present

## 2015-09-16 DIAGNOSIS — H5203 Hypermetropia, bilateral: Secondary | ICD-10-CM | POA: Diagnosis not present

## 2015-09-27 DIAGNOSIS — R3915 Urgency of urination: Secondary | ICD-10-CM | POA: Diagnosis not present

## 2015-10-28 DIAGNOSIS — M5416 Radiculopathy, lumbar region: Secondary | ICD-10-CM | POA: Diagnosis not present

## 2015-11-01 DIAGNOSIS — L409 Psoriasis, unspecified: Secondary | ICD-10-CM | POA: Diagnosis not present

## 2015-11-01 DIAGNOSIS — L281 Prurigo nodularis: Secondary | ICD-10-CM | POA: Diagnosis not present

## 2015-11-01 DIAGNOSIS — Z888 Allergy status to other drugs, medicaments and biological substances status: Secondary | ICD-10-CM | POA: Diagnosis not present

## 2015-11-01 DIAGNOSIS — Z79899 Other long term (current) drug therapy: Secondary | ICD-10-CM | POA: Diagnosis not present

## 2015-11-10 DIAGNOSIS — L281 Prurigo nodularis: Secondary | ICD-10-CM | POA: Diagnosis not present

## 2015-11-10 DIAGNOSIS — L409 Psoriasis, unspecified: Secondary | ICD-10-CM | POA: Diagnosis not present

## 2015-11-17 DIAGNOSIS — Z79899 Other long term (current) drug therapy: Secondary | ICD-10-CM | POA: Diagnosis not present

## 2015-11-26 DIAGNOSIS — I10 Essential (primary) hypertension: Secondary | ICD-10-CM | POA: Diagnosis not present

## 2015-11-26 DIAGNOSIS — I2510 Atherosclerotic heart disease of native coronary artery without angina pectoris: Secondary | ICD-10-CM | POA: Diagnosis not present

## 2015-12-15 DIAGNOSIS — G4700 Insomnia, unspecified: Secondary | ICD-10-CM | POA: Diagnosis not present

## 2015-12-15 DIAGNOSIS — L409 Psoriasis, unspecified: Secondary | ICD-10-CM | POA: Diagnosis not present

## 2015-12-15 DIAGNOSIS — I10 Essential (primary) hypertension: Secondary | ICD-10-CM | POA: Diagnosis not present

## 2015-12-22 DIAGNOSIS — I2510 Atherosclerotic heart disease of native coronary artery without angina pectoris: Secondary | ICD-10-CM | POA: Diagnosis not present

## 2015-12-22 DIAGNOSIS — I10 Essential (primary) hypertension: Secondary | ICD-10-CM | POA: Diagnosis not present

## 2015-12-29 DIAGNOSIS — M5416 Radiculopathy, lumbar region: Secondary | ICD-10-CM | POA: Diagnosis not present

## 2016-01-03 DIAGNOSIS — L281 Prurigo nodularis: Secondary | ICD-10-CM | POA: Diagnosis not present

## 2016-01-03 DIAGNOSIS — Z888 Allergy status to other drugs, medicaments and biological substances status: Secondary | ICD-10-CM | POA: Diagnosis not present

## 2016-01-03 DIAGNOSIS — L409 Psoriasis, unspecified: Secondary | ICD-10-CM | POA: Diagnosis not present

## 2016-01-03 DIAGNOSIS — Z79899 Other long term (current) drug therapy: Secondary | ICD-10-CM | POA: Diagnosis not present

## 2016-01-18 ENCOUNTER — Encounter: Payer: Self-pay | Admitting: Cardiology

## 2016-01-18 ENCOUNTER — Ambulatory Visit (INDEPENDENT_AMBULATORY_CARE_PROVIDER_SITE_OTHER): Payer: Medicare Other | Admitting: Cardiology

## 2016-01-18 VITALS — BP 132/82 | HR 63 | Ht 71.0 in | Wt 222.0 lb

## 2016-01-18 DIAGNOSIS — I25119 Atherosclerotic heart disease of native coronary artery with unspecified angina pectoris: Secondary | ICD-10-CM

## 2016-01-18 DIAGNOSIS — Z889 Allergy status to unspecified drugs, medicaments and biological substances status: Secondary | ICD-10-CM

## 2016-01-18 DIAGNOSIS — Z789 Other specified health status: Secondary | ICD-10-CM

## 2016-01-18 DIAGNOSIS — E785 Hyperlipidemia, unspecified: Secondary | ICD-10-CM | POA: Diagnosis not present

## 2016-01-18 DIAGNOSIS — I10 Essential (primary) hypertension: Secondary | ICD-10-CM | POA: Diagnosis not present

## 2016-01-18 DIAGNOSIS — I272 Other secondary pulmonary hypertension: Secondary | ICD-10-CM

## 2016-01-18 DIAGNOSIS — IMO0002 Reserved for concepts with insufficient information to code with codable children: Secondary | ICD-10-CM

## 2016-01-18 NOTE — Patient Instructions (Signed)
Your physician recommends that you continue on your current medications as directed. Please refer to the Current Medication list given to you today. Your physician recommends that you schedule a follow-up appointment in: 6 months. You will receive a reminder letter in the mail in about 4 months reminding you to call and schedule your appointment. If you don't receive this letter, please contact our office. 

## 2016-01-18 NOTE — Progress Notes (Signed)
Cardiology Office Note  Date: 01/18/2016   ID: Emrick Hensch, DOB [birth year], MRN [id number]  PCP: [name], MD  Primary Cardiologist: Rozann Lesches, MD   Chief Complaint  Patient presents with  . Coronary Artery Disease    History of Present Illness: Jefferey Lippmann is a 70 y.o. male last seen in November 2016. He presents for a routine follow-up visit. 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 Enjoys staying busy, does not plan to retire anytime soon. He reports stable exertional dyspnea, particularly in the heat and high humidity, sometimes going up steps briskly. He has had no chest tightness or nitroglycerin use.  I reviewed his recent lab work, outlined below.  He states that he has been diagnosed with psoriasis. Is now on methotrexate. He has also had trouble with back pain.  He has a history of statin intolerance, specifically Crestor and simvastatin previously. He has been able to tolerate Pravachol.  Blood pressure control is reasonable today. He has noted somewhat of an increased trend. Now on Cozaar instead of lisinopril. We discussed weight loss and salt restriction.  Last ischemic evaluation was within 2 years.  Past Medical History  Diagnosis Date  . Coronary atherosclerosis of native coronary artery     DES x 3 RCA and DES OM 10/11 - [place]  . Mixed hyperlipidemia   . Ulcerative colitis ([place])   . Obstructive sleep apnea   . Sarcoidosis ([place])   . Restless leg syndrome   . Pulmonary hypertension (HCC)     PASP 50 mmHg    Past Surgical History  Procedure Laterality Date  . Right total knee replacement  2005  . Cholecystectomy  1970  . Laparoscopic appendectomy  2013  . Ankle fracture repair  1985    Current Outpatient Prescriptions  Medication Sig Dispense Refill  . aspirin 81 MG tablet Take 81 mg by mouth daily.    . carbidopa-levodopa (SINEMET IR) 10-100 MG per tablet Take 1 tablet by mouth as needed.     . dicyclomine (BENTYL) 10 MG  capsule Take 1 capsule by mouth 4 (four) times daily as needed.     . diphenoxylate-atropine (LOMOTIL) 2.5-0.025 MG per tablet Take 1 tablet by mouth as needed.     Marland Kitchen EPINEPHrine (EPIPEN JR) 0.15 MG/0.3ML injection Inject 0.15 mg into the muscle as needed.    . Fluticasone-Salmeterol (ADVAIR) 250-50 MCG/DOSE AEPB Inhale 1 puff into the lungs every 12 (twelve) hours.    Marland Kitchen HYDROcodone-acetaminophen (VICODIN) 5-500 MG per tablet Take 1 tablet by mouth daily as needed.    . Ibuprofen (ADVIL PO) Take 800 mg by mouth daily.    Marland Kitchen ipratropium-albuterol (DUONEB) 0.5-2.5 (3) MG/3ML SOLN Take 3 mLs by nebulization 2 (two) times daily.    Marland Kitchen levalbuterol (XOPENEX HFA) 45 MCG/ACT inhaler Inhale 2 puffs into the lungs every 6 (six) hours as needed for wheezing.    Marland Kitchen losartan (COZAAR) 25 MG tablet Take 25 mg by mouth daily.    . meloxicam (MOBIC) 15 MG tablet Take 15 mg by mouth daily.    . mesalamine (LIALDA) 1.2 G EC tablet Take 1.2 g by mouth daily with breakfast.     . Methotrexate, PF, 25 MG/0.4ML SOAJ Inject 0.7 mLs into the skin.    . montelukast (SINGULAIR) 10 MG tablet Take 10 mg by mouth at bedtime.    . nitroGLYCERIN (NITROSTAT) 0.4 MG SL tablet Place 1 tablet (0.4 mg total) under the tongue every 5 (five) minutes as needed for  chest pain. 90 tablet 3  . NON FORMULARY CPAP UAD    . omeprazole (PRILOSEC) 20 MG capsule Take 20 mg by mouth daily.    . pravastatin (PRAVACHOL) 20 MG tablet Take 20 mg by mouth daily.    . solifenacin (VESICARE) 5 MG tablet Take 5 mg by mouth daily.    . Tamsulosin HCl (FLOMAX PO) Take 0.4 mg by mouth daily.     . temazepam (RESTORIL) 15 MG capsule Take 30 mg by mouth at bedtime as needed.     . Vitamin D, Ergocalciferol, (DRISDOL) 50000 UNITS CAPS capsule Take 50,000 Units by mouth every 7 (seven) days.     No current facility-administered medications for this visit.   Allergies:  Crestor and Simvastatin   Social History: The patient  reports that he has never  smoked. He has never used smokeless tobacco. He reports that he does not drink alcohol or use illicit drugs.   ROS:  Please see the history of present illness. Otherwise, complete review of systems is positive for back pain.  All other systems are reviewed and negative.   Physical Exam: VS:  BP 132/82 mmHg  Pulse 63  Ht 5' 11"  (1.803 m)  Wt 222 lb (100.699 kg)  BMI 30.98 kg/m2  SpO2 94%, BMI Body mass index is 30.98 kg/(m^2).  Wt Readings from Last 3 Encounters:  01/18/16 222 lb (100.699 kg)  07/12/15 225 lb (102.059 kg)  12/07/14 214 lb (97.07 kg)    Overweight male, appears comfortable at rest.  HEENT: Conjunctiva and lids normal, oropharynx clear.  Neck: Supple, no elevated JVP or carotid bruits, no thyromegaly.  Lungs: Clear to auscultation, nonlabored breathing at rest.  Cardiac: Regular rate and rhythm, no S3, soft systolic murmur, no pericardial rub.  Abdomen: Soft, nontender, bowel sounds present, no guarding or rebound.  Extremities: No pitting edema, distal pulses 2+.  Skin: Warm and dry. No active psoriatic lesions. Musculoskeletal: No kyphosis. Neuropsychiatric: Alert and oriented 3, affect appropriate.  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  Recent Labwork:  October 2016: BUN 18, creatinine 1.0, potassium 4.1, AST 14, ALT 14, TSH 1.29, cholesterol 163, triglycerides 110, HDL 45, LDL 96, hemoglobin 13.9, platelets 272 May 2017: BUN 20, creatinine 0.97, AST 15, ALT 18, Hgb 12, platelets 287  Other Studies Reviewed Today:  Exercise Myoview February 2015: No abnormal ST segment changes, normal perfusion, LVEF 58%.  Assessment and Plan:  1. Symptomatically stable CAD status post prior DES interventions to the RCA and obtuse marginal in 2011. Ischemic testing from February 2015 was low risk. He reports stable dyspnea on exertion, no angina to require nitroglycerin at this time.  We will continue observation. I did recommend weight loss.  2. Essential hypertension, no changes made present regimen. Cozaar could be further advanced if needed. We discussed salt restriction.  3. History of statin intolerance although has been able to stay on Pravachol. Continue management of hyperlipidemia.  4. History of moderate pulmonary hypertension based on prior assessment. Concurrent history of sleep apnea as well as sarcoidosis.  Current medicines were reviewed with the patient today.  Disposition: FU with me in 6 months.   Signed, Satira Sark, MD, [facility] 01/18/2016 8:57 AM    [facility] at [place], [place], [place] [address] Phone: [phone]; Fax: [phone]

## 2016-02-25 DIAGNOSIS — N411 Chronic prostatitis: Secondary | ICD-10-CM | POA: Diagnosis not present

## 2016-03-02 DIAGNOSIS — K5150 Left sided colitis without complications: Secondary | ICD-10-CM | POA: Diagnosis not present

## 2016-03-02 DIAGNOSIS — R1013 Epigastric pain: Secondary | ICD-10-CM | POA: Diagnosis not present

## 2016-03-06 DIAGNOSIS — K5150 Left sided colitis without complications: Secondary | ICD-10-CM | POA: Diagnosis not present

## 2016-03-13 DIAGNOSIS — Z79899 Other long term (current) drug therapy: Secondary | ICD-10-CM | POA: Diagnosis not present

## 2016-03-13 DIAGNOSIS — Z888 Allergy status to other drugs, medicaments and biological substances status: Secondary | ICD-10-CM | POA: Diagnosis not present

## 2016-03-13 DIAGNOSIS — Z5181 Encounter for therapeutic drug level monitoring: Secondary | ICD-10-CM | POA: Diagnosis not present

## 2016-03-13 DIAGNOSIS — L281 Prurigo nodularis: Secondary | ICD-10-CM | POA: Diagnosis not present

## 2016-03-13 DIAGNOSIS — L089 Local infection of the skin and subcutaneous tissue, unspecified: Secondary | ICD-10-CM | POA: Diagnosis not present

## 2016-03-13 DIAGNOSIS — L409 Psoriasis, unspecified: Secondary | ICD-10-CM | POA: Diagnosis not present

## 2016-03-20 DIAGNOSIS — L409 Psoriasis, unspecified: Secondary | ICD-10-CM | POA: Diagnosis not present

## 2016-03-20 DIAGNOSIS — K5190 Ulcerative colitis, unspecified, without complications: Secondary | ICD-10-CM | POA: Diagnosis not present

## 2016-03-20 DIAGNOSIS — L509 Urticaria, unspecified: Secondary | ICD-10-CM | POA: Diagnosis not present

## 2016-03-20 DIAGNOSIS — I10 Essential (primary) hypertension: Secondary | ICD-10-CM | POA: Diagnosis not present

## 2016-03-23 DIAGNOSIS — M5416 Radiculopathy, lumbar region: Secondary | ICD-10-CM | POA: Diagnosis not present

## 2016-03-27 DIAGNOSIS — N5203 Combined arterial insufficiency and corporo-venous occlusive erectile dysfunction: Secondary | ICD-10-CM | POA: Diagnosis not present

## 2016-03-27 DIAGNOSIS — R5383 Other fatigue: Secondary | ICD-10-CM | POA: Diagnosis not present

## 2016-03-27 DIAGNOSIS — R3915 Urgency of urination: Secondary | ICD-10-CM | POA: Diagnosis not present

## 2016-03-29 DIAGNOSIS — I2510 Atherosclerotic heart disease of native coronary artery without angina pectoris: Secondary | ICD-10-CM | POA: Diagnosis not present

## 2016-03-29 DIAGNOSIS — I10 Essential (primary) hypertension: Secondary | ICD-10-CM | POA: Diagnosis not present

## 2016-04-06 DIAGNOSIS — M5126 Other intervertebral disc displacement, lumbar region: Secondary | ICD-10-CM | POA: Diagnosis not present

## 2016-04-06 DIAGNOSIS — M4806 Spinal stenosis, lumbar region: Secondary | ICD-10-CM | POA: Diagnosis not present

## 2016-04-06 DIAGNOSIS — M5416 Radiculopathy, lumbar region: Secondary | ICD-10-CM | POA: Diagnosis not present

## 2016-04-06 DIAGNOSIS — M47817 Spondylosis without myelopathy or radiculopathy, lumbosacral region: Secondary | ICD-10-CM | POA: Diagnosis not present

## 2016-04-10 DIAGNOSIS — Z888 Allergy status to other drugs, medicaments and biological substances status: Secondary | ICD-10-CM | POA: Diagnosis not present

## 2016-04-10 DIAGNOSIS — L281 Prurigo nodularis: Secondary | ICD-10-CM | POA: Diagnosis not present

## 2016-04-10 DIAGNOSIS — L409 Psoriasis, unspecified: Secondary | ICD-10-CM | POA: Diagnosis not present

## 2016-04-10 DIAGNOSIS — Z79899 Other long term (current) drug therapy: Secondary | ICD-10-CM | POA: Diagnosis not present

## 2016-04-21 DIAGNOSIS — E7800 Pure hypercholesterolemia, unspecified: Secondary | ICD-10-CM | POA: Diagnosis not present

## 2016-04-21 DIAGNOSIS — I10 Essential (primary) hypertension: Secondary | ICD-10-CM | POA: Diagnosis not present

## 2016-04-21 DIAGNOSIS — G4700 Insomnia, unspecified: Secondary | ICD-10-CM | POA: Diagnosis not present

## 2016-05-01 DIAGNOSIS — I2510 Atherosclerotic heart disease of native coronary artery without angina pectoris: Secondary | ICD-10-CM | POA: Diagnosis not present

## 2016-05-01 DIAGNOSIS — I10 Essential (primary) hypertension: Secondary | ICD-10-CM | POA: Diagnosis not present

## 2016-05-04 DIAGNOSIS — R3915 Urgency of urination: Secondary | ICD-10-CM | POA: Diagnosis not present

## 2016-05-04 DIAGNOSIS — R3989 Other symptoms and signs involving the genitourinary system: Secondary | ICD-10-CM | POA: Diagnosis not present

## 2016-05-08 DIAGNOSIS — L409 Psoriasis, unspecified: Secondary | ICD-10-CM | POA: Diagnosis not present

## 2016-05-08 DIAGNOSIS — L281 Prurigo nodularis: Secondary | ICD-10-CM | POA: Diagnosis not present

## 2016-05-08 DIAGNOSIS — Z5181 Encounter for therapeutic drug level monitoring: Secondary | ICD-10-CM | POA: Diagnosis not present

## 2016-05-08 DIAGNOSIS — Z79899 Other long term (current) drug therapy: Secondary | ICD-10-CM | POA: Diagnosis not present

## 2016-05-08 DIAGNOSIS — Z888 Allergy status to other drugs, medicaments and biological substances status: Secondary | ICD-10-CM | POA: Diagnosis not present

## 2016-05-25 DIAGNOSIS — M5416 Radiculopathy, lumbar region: Secondary | ICD-10-CM | POA: Diagnosis not present

## 2016-05-29 DIAGNOSIS — R109 Unspecified abdominal pain: Secondary | ICD-10-CM | POA: Diagnosis not present

## 2016-05-29 DIAGNOSIS — R110 Nausea: Secondary | ICD-10-CM | POA: Diagnosis not present

## 2016-05-29 DIAGNOSIS — K5190 Ulcerative colitis, unspecified, without complications: Secondary | ICD-10-CM | POA: Diagnosis not present

## 2016-05-29 DIAGNOSIS — N200 Calculus of kidney: Secondary | ICD-10-CM | POA: Diagnosis not present

## 2016-05-29 DIAGNOSIS — K5730 Diverticulosis of large intestine without perforation or abscess without bleeding: Secondary | ICD-10-CM | POA: Diagnosis not present

## 2016-05-29 DIAGNOSIS — R42 Dizziness and giddiness: Secondary | ICD-10-CM | POA: Diagnosis not present

## 2016-05-29 DIAGNOSIS — N281 Cyst of kidney, acquired: Secondary | ICD-10-CM | POA: Diagnosis not present

## 2016-05-29 DIAGNOSIS — Z299 Encounter for prophylactic measures, unspecified: Secondary | ICD-10-CM | POA: Diagnosis not present

## 2016-05-29 DIAGNOSIS — I10 Essential (primary) hypertension: Secondary | ICD-10-CM | POA: Diagnosis not present

## 2016-05-29 DIAGNOSIS — R140 Abdominal distension (gaseous): Secondary | ICD-10-CM | POA: Diagnosis not present

## 2016-05-29 DIAGNOSIS — I2510 Atherosclerotic heart disease of native coronary artery without angina pectoris: Secondary | ICD-10-CM | POA: Diagnosis not present

## 2016-05-29 DIAGNOSIS — R1012 Left upper quadrant pain: Secondary | ICD-10-CM | POA: Diagnosis not present

## 2016-05-30 DIAGNOSIS — K5792 Diverticulitis of intestine, part unspecified, without perforation or abscess without bleeding: Secondary | ICD-10-CM | POA: Diagnosis not present

## 2016-05-30 DIAGNOSIS — I2510 Atherosclerotic heart disease of native coronary artery without angina pectoris: Secondary | ICD-10-CM | POA: Diagnosis not present

## 2016-05-30 DIAGNOSIS — L299 Pruritus, unspecified: Secondary | ICD-10-CM | POA: Diagnosis not present

## 2016-05-30 DIAGNOSIS — I10 Essential (primary) hypertension: Secondary | ICD-10-CM | POA: Diagnosis not present

## 2016-06-19 DIAGNOSIS — I2510 Atherosclerotic heart disease of native coronary artery without angina pectoris: Secondary | ICD-10-CM | POA: Diagnosis not present

## 2016-06-19 DIAGNOSIS — I10 Essential (primary) hypertension: Secondary | ICD-10-CM | POA: Diagnosis not present

## 2016-06-20 DIAGNOSIS — M5416 Radiculopathy, lumbar region: Secondary | ICD-10-CM | POA: Diagnosis not present

## 2016-06-20 DIAGNOSIS — M545 Low back pain: Secondary | ICD-10-CM | POA: Diagnosis not present

## 2016-06-23 DIAGNOSIS — Z6830 Body mass index (BMI) 30.0-30.9, adult: Secondary | ICD-10-CM | POA: Diagnosis not present

## 2016-06-23 DIAGNOSIS — Z0000 Encounter for general adult medical examination without abnormal findings: Secondary | ICD-10-CM | POA: Diagnosis not present

## 2016-06-23 DIAGNOSIS — Z125 Encounter for screening for malignant neoplasm of prostate: Secondary | ICD-10-CM | POA: Diagnosis not present

## 2016-06-23 DIAGNOSIS — Z7189 Other specified counseling: Secondary | ICD-10-CM | POA: Diagnosis not present

## 2016-06-23 DIAGNOSIS — Z79899 Other long term (current) drug therapy: Secondary | ICD-10-CM | POA: Diagnosis not present

## 2016-06-23 DIAGNOSIS — R5383 Other fatigue: Secondary | ICD-10-CM | POA: Diagnosis not present

## 2016-06-23 DIAGNOSIS — Z299 Encounter for prophylactic measures, unspecified: Secondary | ICD-10-CM | POA: Diagnosis not present

## 2016-06-23 DIAGNOSIS — E7800 Pure hypercholesterolemia, unspecified: Secondary | ICD-10-CM | POA: Diagnosis not present

## 2016-06-23 DIAGNOSIS — Z1389 Encounter for screening for other disorder: Secondary | ICD-10-CM | POA: Diagnosis not present

## 2016-06-23 DIAGNOSIS — Z1211 Encounter for screening for malignant neoplasm of colon: Secondary | ICD-10-CM | POA: Diagnosis not present

## 2016-06-30 DIAGNOSIS — Z23 Encounter for immunization: Secondary | ICD-10-CM | POA: Diagnosis not present

## 2016-08-09 DIAGNOSIS — I10 Essential (primary) hypertension: Secondary | ICD-10-CM | POA: Diagnosis not present

## 2016-08-09 DIAGNOSIS — Z789 Other specified health status: Secondary | ICD-10-CM | POA: Diagnosis not present

## 2016-08-09 DIAGNOSIS — G4700 Insomnia, unspecified: Secondary | ICD-10-CM | POA: Diagnosis not present

## 2016-08-09 DIAGNOSIS — J0100 Acute maxillary sinusitis, unspecified: Secondary | ICD-10-CM | POA: Diagnosis not present

## 2016-08-09 DIAGNOSIS — Z299 Encounter for prophylactic measures, unspecified: Secondary | ICD-10-CM | POA: Diagnosis not present

## 2016-08-09 DIAGNOSIS — Z6831 Body mass index (BMI) 31.0-31.9, adult: Secondary | ICD-10-CM | POA: Diagnosis not present

## 2016-08-31 DIAGNOSIS — G4733 Obstructive sleep apnea (adult) (pediatric): Secondary | ICD-10-CM | POA: Diagnosis not present

## 2016-08-31 DIAGNOSIS — J45909 Unspecified asthma, uncomplicated: Secondary | ICD-10-CM | POA: Diagnosis not present

## 2016-08-31 DIAGNOSIS — D869 Sarcoidosis, unspecified: Secondary | ICD-10-CM | POA: Diagnosis not present

## 2016-08-31 DIAGNOSIS — J309 Allergic rhinitis, unspecified: Secondary | ICD-10-CM | POA: Diagnosis not present

## 2016-09-11 DIAGNOSIS — R140 Abdominal distension (gaseous): Secondary | ICD-10-CM | POA: Diagnosis not present

## 2016-09-11 DIAGNOSIS — K5150 Left sided colitis without complications: Secondary | ICD-10-CM | POA: Diagnosis not present

## 2016-09-11 DIAGNOSIS — R1310 Dysphagia, unspecified: Secondary | ICD-10-CM | POA: Diagnosis not present

## 2016-09-18 DIAGNOSIS — M5416 Radiculopathy, lumbar region: Secondary | ICD-10-CM | POA: Diagnosis not present

## 2016-09-19 DIAGNOSIS — J45909 Unspecified asthma, uncomplicated: Secondary | ICD-10-CM | POA: Diagnosis not present

## 2016-09-19 DIAGNOSIS — D869 Sarcoidosis, unspecified: Secondary | ICD-10-CM | POA: Diagnosis not present

## 2016-09-19 DIAGNOSIS — R0602 Shortness of breath: Secondary | ICD-10-CM | POA: Diagnosis not present

## 2016-09-27 DIAGNOSIS — G4733 Obstructive sleep apnea (adult) (pediatric): Secondary | ICD-10-CM | POA: Diagnosis not present

## 2016-09-27 DIAGNOSIS — K621 Rectal polyp: Secondary | ICD-10-CM | POA: Diagnosis not present

## 2016-09-27 DIAGNOSIS — K529 Noninfective gastroenteritis and colitis, unspecified: Secondary | ICD-10-CM | POA: Diagnosis not present

## 2016-09-27 DIAGNOSIS — K635 Polyp of colon: Secondary | ICD-10-CM | POA: Diagnosis not present

## 2016-09-27 DIAGNOSIS — R140 Abdominal distension (gaseous): Secondary | ICD-10-CM | POA: Diagnosis not present

## 2016-09-27 DIAGNOSIS — K5150 Left sided colitis without complications: Secondary | ICD-10-CM | POA: Diagnosis not present

## 2016-09-27 DIAGNOSIS — M1990 Unspecified osteoarthritis, unspecified site: Secondary | ICD-10-CM | POA: Diagnosis not present

## 2016-09-27 DIAGNOSIS — G2581 Restless legs syndrome: Secondary | ICD-10-CM | POA: Diagnosis not present

## 2016-09-27 DIAGNOSIS — I10 Essential (primary) hypertension: Secondary | ICD-10-CM | POA: Diagnosis not present

## 2016-09-27 DIAGNOSIS — R1310 Dysphagia, unspecified: Secondary | ICD-10-CM | POA: Diagnosis not present

## 2016-09-27 DIAGNOSIS — Z955 Presence of coronary angioplasty implant and graft: Secondary | ICD-10-CM | POA: Diagnosis not present

## 2016-09-27 DIAGNOSIS — K209 Esophagitis, unspecified: Secondary | ICD-10-CM | POA: Diagnosis not present

## 2016-09-27 DIAGNOSIS — I2510 Atherosclerotic heart disease of native coronary artery without angina pectoris: Secondary | ICD-10-CM | POA: Diagnosis not present

## 2016-09-27 DIAGNOSIS — Z79899 Other long term (current) drug therapy: Secondary | ICD-10-CM | POA: Diagnosis not present

## 2016-09-27 DIAGNOSIS — K5730 Diverticulosis of large intestine without perforation or abscess without bleeding: Secondary | ICD-10-CM | POA: Diagnosis not present

## 2016-09-27 DIAGNOSIS — Z7982 Long term (current) use of aspirin: Secondary | ICD-10-CM | POA: Diagnosis not present

## 2016-09-27 DIAGNOSIS — K222 Esophageal obstruction: Secondary | ICD-10-CM | POA: Diagnosis not present

## 2016-09-27 DIAGNOSIS — K449 Diaphragmatic hernia without obstruction or gangrene: Secondary | ICD-10-CM | POA: Diagnosis not present

## 2016-09-27 DIAGNOSIS — J45909 Unspecified asthma, uncomplicated: Secondary | ICD-10-CM | POA: Diagnosis not present

## 2016-10-02 DIAGNOSIS — Z79899 Other long term (current) drug therapy: Secondary | ICD-10-CM | POA: Diagnosis not present

## 2016-10-02 DIAGNOSIS — Z5181 Encounter for therapeutic drug level monitoring: Secondary | ICD-10-CM | POA: Diagnosis not present

## 2016-10-02 DIAGNOSIS — L281 Prurigo nodularis: Secondary | ICD-10-CM | POA: Diagnosis not present

## 2016-10-02 DIAGNOSIS — L089 Local infection of the skin and subcutaneous tissue, unspecified: Secondary | ICD-10-CM | POA: Diagnosis not present

## 2016-10-02 DIAGNOSIS — L409 Psoriasis, unspecified: Secondary | ICD-10-CM | POA: Diagnosis not present

## 2016-10-09 DIAGNOSIS — M5416 Radiculopathy, lumbar region: Secondary | ICD-10-CM | POA: Diagnosis not present

## 2016-10-09 DIAGNOSIS — M5410 Radiculopathy, site unspecified: Secondary | ICD-10-CM | POA: Diagnosis not present

## 2016-10-09 DIAGNOSIS — M545 Low back pain: Secondary | ICD-10-CM | POA: Diagnosis not present

## 2016-10-12 DIAGNOSIS — I10 Essential (primary) hypertension: Secondary | ICD-10-CM | POA: Diagnosis not present

## 2016-10-12 DIAGNOSIS — I2510 Atherosclerotic heart disease of native coronary artery without angina pectoris: Secondary | ICD-10-CM | POA: Diagnosis not present

## 2016-11-06 DIAGNOSIS — H5203 Hypermetropia, bilateral: Secondary | ICD-10-CM | POA: Diagnosis not present

## 2016-11-06 DIAGNOSIS — H2513 Age-related nuclear cataract, bilateral: Secondary | ICD-10-CM | POA: Diagnosis not present

## 2016-11-06 DIAGNOSIS — H524 Presbyopia: Secondary | ICD-10-CM | POA: Diagnosis not present

## 2016-11-08 DIAGNOSIS — I2510 Atherosclerotic heart disease of native coronary artery without angina pectoris: Secondary | ICD-10-CM | POA: Diagnosis not present

## 2016-11-08 DIAGNOSIS — K449 Diaphragmatic hernia without obstruction or gangrene: Secondary | ICD-10-CM | POA: Diagnosis not present

## 2016-11-08 DIAGNOSIS — G4700 Insomnia, unspecified: Secondary | ICD-10-CM | POA: Diagnosis not present

## 2016-11-08 DIAGNOSIS — H269 Unspecified cataract: Secondary | ICD-10-CM | POA: Diagnosis not present

## 2016-11-08 DIAGNOSIS — Z789 Other specified health status: Secondary | ICD-10-CM | POA: Diagnosis not present

## 2016-11-08 DIAGNOSIS — Z299 Encounter for prophylactic measures, unspecified: Secondary | ICD-10-CM | POA: Diagnosis not present

## 2016-11-08 DIAGNOSIS — K5190 Ulcerative colitis, unspecified, without complications: Secondary | ICD-10-CM | POA: Diagnosis not present

## 2016-11-08 DIAGNOSIS — Z6831 Body mass index (BMI) 31.0-31.9, adult: Secondary | ICD-10-CM | POA: Diagnosis not present

## 2016-11-08 DIAGNOSIS — E7800 Pure hypercholesterolemia, unspecified: Secondary | ICD-10-CM | POA: Diagnosis not present

## 2016-11-08 DIAGNOSIS — J45909 Unspecified asthma, uncomplicated: Secondary | ICD-10-CM | POA: Diagnosis not present

## 2016-11-08 DIAGNOSIS — R350 Frequency of micturition: Secondary | ICD-10-CM | POA: Diagnosis not present

## 2016-11-08 DIAGNOSIS — I10 Essential (primary) hypertension: Secondary | ICD-10-CM | POA: Diagnosis not present

## 2016-11-30 ENCOUNTER — Encounter: Payer: Self-pay | Admitting: *Deleted

## 2016-11-30 NOTE — Progress Notes (Signed)
Cardiology Office Note  Date: 12/01/2016   ID: Richard Webb, DOB Apr 13, 1946, MRN [id number]  PCP: [name], MD  Primary Cardiologist: Rozann Lesches, MD   Chief Complaint  Patient presents with  . Coronary Artery Disease    History of Present Illness: Richard Webb is a 71 y.o. male last seen in May 2017. He presents for a routine follow-up visit. 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 He does state that he gets tired more than he used to. Also describes exertional angina mainly with lifting and other more strenuous outdoor activity. He does go to the gym a few days a week, does not report angina on the treadmill. He has not had use any nitroglycerin.  I reviewed his current cardiac medications. Regimen includes aspirin, Cozaar, Pravachol, and as needed nitroglycerin.  He had lab work done back in November, results outlined below.  His last ischemic evaluation was in 2015. We discussed obtaining a follow-up study in light of his angina symptoms. I reviewed his ECG today which showed sinus bradycardia.  Past Medical History:  Diagnosis Date  . Coronary atherosclerosis of native coronary artery    DES x 3 RCA and DES OM 10/11 - [place]  . Mixed hyperlipidemia   . Obstructive sleep apnea   . Pulmonary hypertension    PASP 50 mmHg  . Restless leg syndrome   . Sarcoidosis ([place])   . Ulcerative colitis [facility])     Past Surgical History:  Procedure Laterality Date  . Ankle fracture repair  1985  . CHOLECYSTECTOMY  1970  . LAPAROSCOPIC APPENDECTOMY  2013  . Right total knee replacement  2005    Current Outpatient Prescriptions  Medication Sig Dispense Refill  . aspirin 81 MG tablet Take 81 mg by mouth daily.    . budesonide-formoterol (SYMBICORT) 160-4.5 MCG/ACT inhaler Inhale 2 puffs into the lungs 2 (two) times daily.    . carbidopa-levodopa (SINEMET IR) 10-100 MG  per tablet Take 1 tablet by mouth as needed.     . dicyclomine (BENTYL) 10 MG capsule Take 1 capsule by mouth 4 (four) times daily as needed.     . diphenoxylate-atropine (LOMOTIL) 2.5-0.025 MG per tablet Take 1 tablet by mouth as needed.     Marland Kitchen EPINEPHrine (EPIPEN JR) 0.15 MG/0.3ML injection Inject 0.15 mg into the muscle as needed.    Marland Kitchen HYDROcodone-acetaminophen (VICODIN) 5-500 MG per tablet Take 1 tablet by mouth daily as needed.    . Ibuprofen (ADVIL PO) Take 800 mg by mouth daily.    Marland Kitchen ipratropium-albuterol (DUONEB) 0.5-2.5 (3) MG/3ML SOLN Take 3 mLs by nebulization 2 (two) times daily.    Marland Kitchen levalbuterol (XOPENEX HFA) 45 MCG/ACT inhaler Inhale 2 puffs into the lungs every 6 (six) hours as needed for wheezing.    Marland Kitchen losartan (COZAAR) 25 MG tablet Take 25 mg by mouth daily.    . meloxicam (MOBIC) 15 MG tablet Take 15 mg by mouth daily.    . mesalamine (LIALDA) 1.2 G EC tablet Take 1.2 g by mouth daily with breakfast.     . montelukast (SINGULAIR) 10 MG tablet Take 10 mg by mouth at bedtime.    . nitroGLYCERIN (NITROSTAT) 0.4 MG SL tablet Place 1 tablet (0.4 mg total) under the tongue every 5 (five) minutes as needed for chest pain. 90 tablet 3  . NON FORMULARY CPAP UAD    . omeprazole (  PRILOSEC) 20 MG capsule Take 20 mg by mouth daily.    . pravastatin (PRAVACHOL) 20 MG tablet Take 20 mg by mouth daily.    . solifenacin (VESICARE) 5 MG tablet Take 5 mg by mouth daily.    . Tamsulosin HCl (FLOMAX PO) Take 0.4 mg by mouth daily.     . temazepam (RESTORIL) 15 MG capsule Take 30 mg by mouth at bedtime as needed.     . Vitamin D, Ergocalciferol, (DRISDOL) 50000 UNITS CAPS capsule Take 50,000 Units by mouth every 7 (seven) days.     No current facility-administered medications for this visit.    Allergies:  Crestor [rosuvastatin] and Simvastatin   Social History: The patient  reports that he has never smoked. He has never used smokeless tobacco. He reports that he does not drink alcohol or use  drugs.   ROS:  Please see the history of present illness. Otherwise, complete review of systems is positive for lower back pain.  All other systems are reviewed and negative.   Physical Exam: VS:  BP 122/80   Pulse 64   Ht 5' 11"  (1.803 m)   Wt 222 lb (100.7 kg)   SpO2 95%   BMI 30.96 kg/m , BMI Body mass index is 30.96 kg/m.  Wt Readings from Last 3 Encounters:  12/01/16 222 lb (100.7 kg)  01/18/16 222 lb (100.7 kg)  07/12/15 225 lb (102.1 kg)    Overweight male, appears comfortable at rest.  HEENT: Conjunctiva and lids normal, oropharynx clear.  Neck: Supple, no elevated JVP or carotid bruits, no thyromegaly.  Lungs: Clear to auscultation, nonlabored breathing at rest.  Cardiac: Regular rate and rhythm, no S3, soft systolic murmur, no pericardial rub.  Abdomen: Soft, nontender, bowel sounds present, no guarding or rebound.  Extremities: No pitting edema, distal pulses 2+.  Skin: Warm and dry. Musculoskeletal: No kyphosis. Neuropsychiatric: Alert and oriented 3, affect appropriate.  ECG: I personally reviewed the tracing from 07/12/2015 which showed sinus rhythm with old inferior infarct pattern.  Recent Labwork:  May 2017: BUN 20, creatinine 0.97, AST 15, ALT 18, Hgb 12, platelets 287 November 2017: Hemoglobin 13.2, platelets 299, cholesterol 178, triglycerides 104, HDL 49, LDL 108  Other Studies Reviewed Today:  Exercise Myoview February 2015: No abnormal ST segment changes, normal perfusion, LVEF 58%.  Assessment and Plan:  1. CAD with exertional angina symptoms on medical therapy. He is status post DES 3 to the RCA and DES to the OM in 2011 in [place]. Heart rate and blood pressure well controlled today. We will obtain a follow-up Lexiscan Myoview to reassess ischemic burden.  2. Hyperlipidemia, on Pravachol. Recent LDL 108.  3. Obstructive sleep apnea and restless leg syndrome. He continues to follow with Dr. Manuella Ghazi.  4. Essential hypertension, on  Cozaar. Systolic blood pressure 532Y today.  Current medicines were reviewed with the patient today.   Orders Placed This Encounter  Procedures  . NM Myocar Multi W/Spect W/Wall Motion / EF  . Myocardial Perfusion Imaging  . EKG 12-Lead    Disposition: Call with test results.  Signed, Satira Sark, MD, [facility] 12/01/2016 9:57 AM    [facility] at [place], [place], [place] [address] Phone: [phone]; Fax: [phone]

## 2016-12-01 ENCOUNTER — Ambulatory Visit (INDEPENDENT_AMBULATORY_CARE_PROVIDER_SITE_OTHER): Payer: Medicare Other | Admitting: Cardiology

## 2016-12-01 ENCOUNTER — Encounter: Payer: Self-pay | Admitting: *Deleted

## 2016-12-01 ENCOUNTER — Encounter: Payer: Self-pay | Admitting: Cardiology

## 2016-12-01 VITALS — BP 122/80 | HR 64 | Ht 71.0 in | Wt 222.0 lb

## 2016-12-01 DIAGNOSIS — I25119 Atherosclerotic heart disease of native coronary artery with unspecified angina pectoris: Secondary | ICD-10-CM | POA: Diagnosis not present

## 2016-12-01 DIAGNOSIS — E782 Mixed hyperlipidemia: Secondary | ICD-10-CM | POA: Diagnosis not present

## 2016-12-01 DIAGNOSIS — I209 Angina pectoris, unspecified: Secondary | ICD-10-CM | POA: Diagnosis not present

## 2016-12-01 DIAGNOSIS — G4733 Obstructive sleep apnea (adult) (pediatric): Secondary | ICD-10-CM | POA: Diagnosis not present

## 2016-12-01 DIAGNOSIS — I10 Essential (primary) hypertension: Secondary | ICD-10-CM

## 2016-12-01 NOTE — Patient Instructions (Signed)
Your physician wants you to follow-up in: [place] will receive a reminder letter in the mail two months in advance. If you don't receive a letter, please call our office to schedule the follow-up appointment.  Your physician recommends that you continue on your current medications as directed. Please refer to the Current Medication list given to you today.  Your physician has requested that you have en exercise stress myoview. For further information please visit [email or web address]. Please follow instruction sheet, as given.  Thank you for choosing [facility]!!

## 2016-12-05 DIAGNOSIS — G4700 Insomnia, unspecified: Secondary | ICD-10-CM | POA: Diagnosis not present

## 2016-12-05 DIAGNOSIS — I10 Essential (primary) hypertension: Secondary | ICD-10-CM | POA: Diagnosis not present

## 2016-12-05 DIAGNOSIS — K5190 Ulcerative colitis, unspecified, without complications: Secondary | ICD-10-CM | POA: Diagnosis not present

## 2016-12-05 DIAGNOSIS — D869 Sarcoidosis, unspecified: Secondary | ICD-10-CM | POA: Diagnosis not present

## 2016-12-05 DIAGNOSIS — Z299 Encounter for prophylactic measures, unspecified: Secondary | ICD-10-CM | POA: Diagnosis not present

## 2016-12-05 DIAGNOSIS — K449 Diaphragmatic hernia without obstruction or gangrene: Secondary | ICD-10-CM | POA: Diagnosis not present

## 2016-12-05 DIAGNOSIS — E7800 Pure hypercholesterolemia, unspecified: Secondary | ICD-10-CM | POA: Diagnosis not present

## 2016-12-05 DIAGNOSIS — M67441 Ganglion, right hand: Secondary | ICD-10-CM | POA: Diagnosis not present

## 2016-12-05 DIAGNOSIS — Z6830 Body mass index (BMI) 30.0-30.9, adult: Secondary | ICD-10-CM | POA: Diagnosis not present

## 2016-12-05 DIAGNOSIS — L409 Psoriasis, unspecified: Secondary | ICD-10-CM | POA: Diagnosis not present

## 2016-12-05 DIAGNOSIS — N400 Enlarged prostate without lower urinary tract symptoms: Secondary | ICD-10-CM | POA: Diagnosis not present

## 2016-12-05 DIAGNOSIS — J45909 Unspecified asthma, uncomplicated: Secondary | ICD-10-CM | POA: Diagnosis not present

## 2016-12-11 DIAGNOSIS — M5416 Radiculopathy, lumbar region: Secondary | ICD-10-CM | POA: Diagnosis not present

## 2016-12-11 DIAGNOSIS — M79604 Pain in right leg: Secondary | ICD-10-CM | POA: Diagnosis not present

## 2016-12-14 ENCOUNTER — Encounter (HOSPITAL_COMMUNITY): Payer: Medicare Other

## 2016-12-14 ENCOUNTER — Other Ambulatory Visit (HOSPITAL_COMMUNITY): Payer: Medicare Other

## 2016-12-18 ENCOUNTER — Encounter (HOSPITAL_COMMUNITY): Payer: Self-pay

## 2016-12-18 ENCOUNTER — Encounter (HOSPITAL_BASED_OUTPATIENT_CLINIC_OR_DEPARTMENT_OTHER)
Admission: RE | Admit: 2016-12-18 | Discharge: 2016-12-18 | Disposition: A | Discharge status: Home / Self Care | Payer: Medicare Other | Source: Ambulatory Visit | Attending: Cardiology | Admitting: Cardiology

## 2016-12-18 ENCOUNTER — Encounter (HOSPITAL_COMMUNITY)
Admission: RE | Admit: 2016-12-18 | Discharge: 2016-12-18 | Disposition: A | Discharge status: Home / Self Care | Payer: Medicare Other | Source: Ambulatory Visit | Attending: Cardiology | Admitting: Cardiology

## 2016-12-18 DIAGNOSIS — I25119 Atherosclerotic heart disease of native coronary artery with unspecified angina pectoris: Secondary | ICD-10-CM | POA: Insufficient documentation

## 2016-12-18 LAB — NM MYOCAR MULTI W/SPECT W/WALL MOTION / EF
CHL CUP MPHR: 150 {beats}/min
CHL CUP NUCLEAR SSS: 0
CSEPED: 5 min
CSEPHR: 100 %
CSEPPHR: 150 {beats}/min
Estimated workload: 7 METS
Exercise duration (sec): 27 s
LV sys vol: 39 mL
LVDIAVOL: 96 mL (ref 62–150)
RATE: 0.39
RPE: 17
Rest HR: 61 {beats}/min
SDS: 0
SRS: 0
TID: 1.04

## 2016-12-18 MED ORDER — SODIUM CHLORIDE 0.9% FLUSH
INTRAVENOUS | Status: AC
  Filled 2016-12-18: qty 10

## 2016-12-18 MED ORDER — TECHNETIUM TC 99M TETROFOSMIN IV KIT
30.0000 | PACK | Freq: Once | INTRAVENOUS | Status: AC | PRN
  Administered 2016-12-18: 32 via INTRAVENOUS

## 2016-12-18 MED ORDER — TECHNETIUM TC 99M TETROFOSMIN IV KIT
10.0000 | PACK | Freq: Once | INTRAVENOUS | Status: AC | PRN
  Administered 2016-12-18: 10.8 via INTRAVENOUS

## 2016-12-18 MED ORDER — REGADENOSON 0.4 MG/5ML IV SOLN
INTRAVENOUS | Status: AC
  Filled 2016-12-18: qty 5

## 2016-12-19 ENCOUNTER — Telehealth: Payer: Self-pay

## 2016-12-19 NOTE — Telephone Encounter (Signed)
-----   Message from Satira Sark, MD sent at 12/18/2016  4:07 PM EDT ----- Results reviewed. Stress testing was low risk without significant ischemic burden. Suggest that we continue medical therapy and observation for now. A copy of this test should be forwarded to [facility], MD.

## 2016-12-19 NOTE — Telephone Encounter (Signed)
-----   Message from Satira Sark, MD sent at 12/18/2016  4:07 PM EDT ----- Results reviewed. Stress testing was low risk without significant ischemic burden. Suggest that we continue medical therapy and observation for now. A copy of this test should be forwarded to [facility], MD.

## 2016-12-19 NOTE — Telephone Encounter (Signed)
Patient notified. Routed to PCP and to patient

## 2016-12-20 DIAGNOSIS — M5416 Radiculopathy, lumbar region: Secondary | ICD-10-CM | POA: Diagnosis not present

## 2017-01-09 DIAGNOSIS — R140 Abdominal distension (gaseous): Secondary | ICD-10-CM | POA: Diagnosis not present

## 2017-01-09 DIAGNOSIS — K5150 Left sided colitis without complications: Secondary | ICD-10-CM | POA: Diagnosis not present

## 2017-01-09 DIAGNOSIS — E785 Hyperlipidemia, unspecified: Secondary | ICD-10-CM | POA: Diagnosis not present

## 2017-01-09 DIAGNOSIS — R12 Heartburn: Secondary | ICD-10-CM | POA: Diagnosis not present

## 2017-01-10 DIAGNOSIS — H35373 Puckering of macula, bilateral: Secondary | ICD-10-CM | POA: Diagnosis not present

## 2017-01-10 DIAGNOSIS — H524 Presbyopia: Secondary | ICD-10-CM | POA: Diagnosis not present

## 2017-01-10 DIAGNOSIS — H2513 Age-related nuclear cataract, bilateral: Secondary | ICD-10-CM | POA: Diagnosis not present

## 2017-01-10 DIAGNOSIS — H5203 Hypermetropia, bilateral: Secondary | ICD-10-CM | POA: Diagnosis not present

## 2017-01-16 DIAGNOSIS — M545 Low back pain: Secondary | ICD-10-CM | POA: Diagnosis not present

## 2017-01-17 DIAGNOSIS — G4733 Obstructive sleep apnea (adult) (pediatric): Secondary | ICD-10-CM | POA: Diagnosis not present

## 2017-01-17 DIAGNOSIS — D869 Sarcoidosis, unspecified: Secondary | ICD-10-CM | POA: Diagnosis not present

## 2017-01-17 DIAGNOSIS — J45909 Unspecified asthma, uncomplicated: Secondary | ICD-10-CM | POA: Diagnosis not present

## 2017-01-17 DIAGNOSIS — Z9181 History of falling: Secondary | ICD-10-CM | POA: Diagnosis not present

## 2017-01-17 DIAGNOSIS — J309 Allergic rhinitis, unspecified: Secondary | ICD-10-CM | POA: Diagnosis not present

## 2017-01-17 DIAGNOSIS — R911 Solitary pulmonary nodule: Secondary | ICD-10-CM | POA: Diagnosis not present

## 2017-01-24 DIAGNOSIS — E785 Hyperlipidemia, unspecified: Secondary | ICD-10-CM | POA: Diagnosis not present

## 2017-01-24 DIAGNOSIS — K5150 Left sided colitis without complications: Secondary | ICD-10-CM | POA: Diagnosis not present

## 2017-01-26 DIAGNOSIS — H35411 Lattice degeneration of retina, right eye: Secondary | ICD-10-CM | POA: Diagnosis not present

## 2017-01-26 DIAGNOSIS — H35373 Puckering of macula, bilateral: Secondary | ICD-10-CM | POA: Diagnosis not present

## 2017-01-26 DIAGNOSIS — H35422 Microcystoid degeneration of retina, left eye: Secondary | ICD-10-CM | POA: Diagnosis not present

## 2017-01-26 DIAGNOSIS — H43813 Vitreous degeneration, bilateral: Secondary | ICD-10-CM | POA: Diagnosis not present

## 2017-01-29 DIAGNOSIS — Z955 Presence of coronary angioplasty implant and graft: Secondary | ICD-10-CM | POA: Diagnosis not present

## 2017-01-29 DIAGNOSIS — I10 Essential (primary) hypertension: Secondary | ICD-10-CM | POA: Diagnosis not present

## 2017-01-29 DIAGNOSIS — E538 Deficiency of other specified B group vitamins: Secondary | ICD-10-CM | POA: Diagnosis not present

## 2017-01-29 DIAGNOSIS — R0602 Shortness of breath: Secondary | ICD-10-CM | POA: Diagnosis not present

## 2017-01-29 DIAGNOSIS — Z5181 Encounter for therapeutic drug level monitoring: Secondary | ICD-10-CM | POA: Diagnosis not present

## 2017-01-29 DIAGNOSIS — L281 Prurigo nodularis: Secondary | ICD-10-CM | POA: Diagnosis not present

## 2017-01-29 DIAGNOSIS — J479 Bronchiectasis, uncomplicated: Secondary | ICD-10-CM | POA: Diagnosis not present

## 2017-01-29 DIAGNOSIS — Z6829 Body mass index (BMI) 29.0-29.9, adult: Secondary | ICD-10-CM | POA: Diagnosis not present

## 2017-01-29 DIAGNOSIS — Z881 Allergy status to other antibiotic agents status: Secondary | ICD-10-CM | POA: Diagnosis not present

## 2017-01-29 DIAGNOSIS — I2510 Atherosclerotic heart disease of native coronary artery without angina pectoris: Secondary | ICD-10-CM | POA: Diagnosis not present

## 2017-01-29 DIAGNOSIS — Z79899 Other long term (current) drug therapy: Secondary | ICD-10-CM | POA: Diagnosis not present

## 2017-01-29 DIAGNOSIS — Z299 Encounter for prophylactic measures, unspecified: Secondary | ICD-10-CM | POA: Diagnosis not present

## 2017-01-29 DIAGNOSIS — E7800 Pure hypercholesterolemia, unspecified: Secondary | ICD-10-CM | POA: Diagnosis not present

## 2017-01-29 DIAGNOSIS — N400 Enlarged prostate without lower urinary tract symptoms: Secondary | ICD-10-CM | POA: Diagnosis not present

## 2017-01-29 DIAGNOSIS — G2581 Restless legs syndrome: Secondary | ICD-10-CM | POA: Diagnosis not present

## 2017-01-29 DIAGNOSIS — L409 Psoriasis, unspecified: Secondary | ICD-10-CM | POA: Diagnosis not present

## 2017-01-29 DIAGNOSIS — K5190 Ulcerative colitis, unspecified, without complications: Secondary | ICD-10-CM | POA: Diagnosis not present

## 2017-02-23 ENCOUNTER — Ambulatory Visit (INDEPENDENT_AMBULATORY_CARE_PROVIDER_SITE_OTHER): Payer: Medicare Other | Admitting: Urology

## 2017-02-23 DIAGNOSIS — N401 Enlarged prostate with lower urinary tract symptoms: Secondary | ICD-10-CM | POA: Diagnosis not present

## 2017-02-23 DIAGNOSIS — R350 Frequency of micturition: Secondary | ICD-10-CM | POA: Diagnosis not present

## 2017-02-23 DIAGNOSIS — N3941 Urge incontinence: Secondary | ICD-10-CM

## 2017-02-26 DIAGNOSIS — D869 Sarcoidosis, unspecified: Secondary | ICD-10-CM | POA: Diagnosis not present

## 2017-03-06 DIAGNOSIS — N419 Inflammatory disease of prostate, unspecified: Secondary | ICD-10-CM | POA: Diagnosis not present

## 2017-03-06 DIAGNOSIS — J479 Bronchiectasis, uncomplicated: Secondary | ICD-10-CM | POA: Diagnosis not present

## 2017-03-06 DIAGNOSIS — Z299 Encounter for prophylactic measures, unspecified: Secondary | ICD-10-CM | POA: Diagnosis not present

## 2017-03-06 DIAGNOSIS — I10 Essential (primary) hypertension: Secondary | ICD-10-CM | POA: Diagnosis not present

## 2017-03-06 DIAGNOSIS — G4700 Insomnia, unspecified: Secondary | ICD-10-CM | POA: Diagnosis not present

## 2017-03-06 DIAGNOSIS — R350 Frequency of micturition: Secondary | ICD-10-CM | POA: Diagnosis not present

## 2017-03-06 DIAGNOSIS — Z6829 Body mass index (BMI) 29.0-29.9, adult: Secondary | ICD-10-CM | POA: Diagnosis not present

## 2017-03-06 DIAGNOSIS — E7800 Pure hypercholesterolemia, unspecified: Secondary | ICD-10-CM | POA: Diagnosis not present

## 2017-03-06 DIAGNOSIS — Z713 Dietary counseling and surveillance: Secondary | ICD-10-CM | POA: Diagnosis not present

## 2017-03-22 DIAGNOSIS — J479 Bronchiectasis, uncomplicated: Secondary | ICD-10-CM | POA: Diagnosis not present

## 2017-03-22 DIAGNOSIS — J45909 Unspecified asthma, uncomplicated: Secondary | ICD-10-CM | POA: Diagnosis not present

## 2017-03-22 DIAGNOSIS — I2510 Atherosclerotic heart disease of native coronary artery without angina pectoris: Secondary | ICD-10-CM | POA: Diagnosis not present

## 2017-03-22 DIAGNOSIS — Z299 Encounter for prophylactic measures, unspecified: Secondary | ICD-10-CM | POA: Diagnosis not present

## 2017-03-22 DIAGNOSIS — E538 Deficiency of other specified B group vitamins: Secondary | ICD-10-CM | POA: Diagnosis not present

## 2017-03-22 DIAGNOSIS — M25512 Pain in left shoulder: Secondary | ICD-10-CM | POA: Diagnosis not present

## 2017-03-22 DIAGNOSIS — L409 Psoriasis, unspecified: Secondary | ICD-10-CM | POA: Diagnosis not present

## 2017-03-22 DIAGNOSIS — I10 Essential (primary) hypertension: Secondary | ICD-10-CM | POA: Diagnosis not present

## 2017-03-22 DIAGNOSIS — N400 Enlarged prostate without lower urinary tract symptoms: Secondary | ICD-10-CM | POA: Diagnosis not present

## 2017-03-22 DIAGNOSIS — E7800 Pure hypercholesterolemia, unspecified: Secondary | ICD-10-CM | POA: Diagnosis not present

## 2017-03-22 DIAGNOSIS — K5190 Ulcerative colitis, unspecified, without complications: Secondary | ICD-10-CM | POA: Diagnosis not present

## 2017-03-22 DIAGNOSIS — Z6829 Body mass index (BMI) 29.0-29.9, adult: Secondary | ICD-10-CM | POA: Diagnosis not present

## 2017-03-27 DIAGNOSIS — I10 Essential (primary) hypertension: Secondary | ICD-10-CM | POA: Diagnosis not present

## 2017-03-27 DIAGNOSIS — I2510 Atherosclerotic heart disease of native coronary artery without angina pectoris: Secondary | ICD-10-CM | POA: Diagnosis not present

## 2017-04-06 ENCOUNTER — Ambulatory Visit (INDEPENDENT_AMBULATORY_CARE_PROVIDER_SITE_OTHER): Payer: Medicare Other | Admitting: Urology

## 2017-04-06 DIAGNOSIS — N3941 Urge incontinence: Secondary | ICD-10-CM

## 2017-04-06 DIAGNOSIS — N401 Enlarged prostate with lower urinary tract symptoms: Secondary | ICD-10-CM | POA: Diagnosis not present

## 2017-04-06 DIAGNOSIS — R350 Frequency of micturition: Secondary | ICD-10-CM | POA: Diagnosis not present

## 2017-04-12 DIAGNOSIS — H2513 Age-related nuclear cataract, bilateral: Secondary | ICD-10-CM | POA: Diagnosis not present

## 2017-04-12 DIAGNOSIS — H35373 Puckering of macula, bilateral: Secondary | ICD-10-CM | POA: Diagnosis not present

## 2017-04-12 DIAGNOSIS — H524 Presbyopia: Secondary | ICD-10-CM | POA: Diagnosis not present

## 2017-04-24 DIAGNOSIS — L400 Psoriasis vulgaris: Secondary | ICD-10-CM | POA: Diagnosis not present

## 2017-04-24 DIAGNOSIS — L281 Prurigo nodularis: Secondary | ICD-10-CM | POA: Diagnosis not present

## 2017-04-24 DIAGNOSIS — L089 Local infection of the skin and subcutaneous tissue, unspecified: Secondary | ICD-10-CM | POA: Diagnosis not present

## 2017-05-01 DIAGNOSIS — M5417 Radiculopathy, lumbosacral region: Secondary | ICD-10-CM | POA: Diagnosis not present

## 2017-05-01 DIAGNOSIS — M5136 Other intervertebral disc degeneration, lumbar region: Secondary | ICD-10-CM | POA: Diagnosis not present

## 2017-05-01 DIAGNOSIS — M545 Low back pain: Secondary | ICD-10-CM | POA: Diagnosis not present

## 2017-05-03 DIAGNOSIS — M5417 Radiculopathy, lumbosacral region: Secondary | ICD-10-CM | POA: Diagnosis not present

## 2017-05-03 DIAGNOSIS — M5136 Other intervertebral disc degeneration, lumbar region: Secondary | ICD-10-CM | POA: Diagnosis not present

## 2017-05-06 DIAGNOSIS — M4696 Unspecified inflammatory spondylopathy, lumbar region: Secondary | ICD-10-CM | POA: Diagnosis not present

## 2017-05-06 DIAGNOSIS — M5136 Other intervertebral disc degeneration, lumbar region: Secondary | ICD-10-CM | POA: Diagnosis not present

## 2017-05-06 DIAGNOSIS — M48061 Spinal stenosis, lumbar region without neurogenic claudication: Secondary | ICD-10-CM | POA: Diagnosis not present

## 2017-05-06 DIAGNOSIS — M4686 Other specified inflammatory spondylopathies, lumbar region: Secondary | ICD-10-CM | POA: Diagnosis not present

## 2017-05-06 DIAGNOSIS — M4807 Spinal stenosis, lumbosacral region: Secondary | ICD-10-CM | POA: Diagnosis not present

## 2017-05-06 DIAGNOSIS — M9983 Other biomechanical lesions of lumbar region: Secondary | ICD-10-CM | POA: Diagnosis not present

## 2017-05-06 DIAGNOSIS — M5126 Other intervertebral disc displacement, lumbar region: Secondary | ICD-10-CM | POA: Diagnosis not present

## 2017-05-06 DIAGNOSIS — M9982 Other biomechanical lesions of thoracic region: Secondary | ICD-10-CM | POA: Diagnosis not present

## 2017-05-06 DIAGNOSIS — M2578 Osteophyte, vertebrae: Secondary | ICD-10-CM | POA: Diagnosis not present

## 2017-05-06 DIAGNOSIS — M2548 Effusion, other site: Secondary | ICD-10-CM | POA: Diagnosis not present

## 2017-05-07 DIAGNOSIS — M5416 Radiculopathy, lumbar region: Secondary | ICD-10-CM | POA: Diagnosis not present

## 2017-05-10 DIAGNOSIS — M5416 Radiculopathy, lumbar region: Secondary | ICD-10-CM | POA: Diagnosis not present

## 2017-05-10 DIAGNOSIS — M545 Low back pain: Secondary | ICD-10-CM | POA: Diagnosis not present

## 2017-05-10 DIAGNOSIS — M5410 Radiculopathy, site unspecified: Secondary | ICD-10-CM | POA: Diagnosis not present

## 2017-05-15 DIAGNOSIS — M79644 Pain in right finger(s): Secondary | ICD-10-CM | POA: Diagnosis not present

## 2017-05-15 DIAGNOSIS — M20012 Mallet finger of left finger(s): Secondary | ICD-10-CM | POA: Diagnosis not present

## 2017-05-15 DIAGNOSIS — M25649 Stiffness of unspecified hand, not elsewhere classified: Secondary | ICD-10-CM | POA: Diagnosis not present

## 2017-05-15 DIAGNOSIS — M25642 Stiffness of left hand, not elsewhere classified: Secondary | ICD-10-CM | POA: Diagnosis not present

## 2017-05-22 DIAGNOSIS — M20012 Mallet finger of left finger(s): Secondary | ICD-10-CM | POA: Diagnosis not present

## 2017-05-28 DIAGNOSIS — N3941 Urge incontinence: Secondary | ICD-10-CM | POA: Diagnosis not present

## 2017-05-28 DIAGNOSIS — N401 Enlarged prostate with lower urinary tract symptoms: Secondary | ICD-10-CM | POA: Diagnosis not present

## 2017-05-28 DIAGNOSIS — R350 Frequency of micturition: Secondary | ICD-10-CM | POA: Diagnosis not present

## 2017-05-29 DIAGNOSIS — Z23 Encounter for immunization: Secondary | ICD-10-CM | POA: Diagnosis not present

## 2017-05-30 DIAGNOSIS — M519 Unspecified thoracic, thoracolumbar and lumbosacral intervertebral disc disorder: Secondary | ICD-10-CM | POA: Diagnosis not present

## 2017-05-30 DIAGNOSIS — M545 Low back pain: Secondary | ICD-10-CM | POA: Diagnosis not present

## 2017-05-30 DIAGNOSIS — M79606 Pain in leg, unspecified: Secondary | ICD-10-CM | POA: Diagnosis not present

## 2017-06-05 DIAGNOSIS — M25642 Stiffness of left hand, not elsewhere classified: Secondary | ICD-10-CM | POA: Diagnosis not present

## 2017-06-05 DIAGNOSIS — M5137 Other intervertebral disc degeneration, lumbosacral region: Secondary | ICD-10-CM | POA: Diagnosis not present

## 2017-06-05 DIAGNOSIS — M20012 Mallet finger of left finger(s): Secondary | ICD-10-CM | POA: Diagnosis not present

## 2017-06-05 DIAGNOSIS — M9905 Segmental and somatic dysfunction of pelvic region: Secondary | ICD-10-CM | POA: Diagnosis not present

## 2017-06-06 DIAGNOSIS — M5137 Other intervertebral disc degeneration, lumbosacral region: Secondary | ICD-10-CM | POA: Diagnosis not present

## 2017-06-06 DIAGNOSIS — M9905 Segmental and somatic dysfunction of pelvic region: Secondary | ICD-10-CM | POA: Diagnosis not present

## 2017-06-07 DIAGNOSIS — M5137 Other intervertebral disc degeneration, lumbosacral region: Secondary | ICD-10-CM | POA: Diagnosis not present

## 2017-06-07 DIAGNOSIS — M9905 Segmental and somatic dysfunction of pelvic region: Secondary | ICD-10-CM | POA: Diagnosis not present

## 2017-06-11 DIAGNOSIS — J309 Allergic rhinitis, unspecified: Secondary | ICD-10-CM | POA: Diagnosis not present

## 2017-06-11 DIAGNOSIS — G4733 Obstructive sleep apnea (adult) (pediatric): Secondary | ICD-10-CM | POA: Diagnosis not present

## 2017-06-11 DIAGNOSIS — K5150 Left sided colitis without complications: Secondary | ICD-10-CM | POA: Diagnosis not present

## 2017-06-11 DIAGNOSIS — D869 Sarcoidosis, unspecified: Secondary | ICD-10-CM | POA: Diagnosis not present

## 2017-06-11 DIAGNOSIS — M9905 Segmental and somatic dysfunction of pelvic region: Secondary | ICD-10-CM | POA: Diagnosis not present

## 2017-06-11 DIAGNOSIS — J479 Bronchiectasis, uncomplicated: Secondary | ICD-10-CM | POA: Diagnosis not present

## 2017-06-11 DIAGNOSIS — R9389 Abnormal findings on diagnostic imaging of other specified body structures: Secondary | ICD-10-CM | POA: Diagnosis not present

## 2017-06-11 DIAGNOSIS — I25811 Atherosclerosis of native coronary artery of transplanted heart without angina pectoris: Secondary | ICD-10-CM | POA: Diagnosis not present

## 2017-06-11 DIAGNOSIS — R0602 Shortness of breath: Secondary | ICD-10-CM | POA: Diagnosis not present

## 2017-06-11 DIAGNOSIS — J449 Chronic obstructive pulmonary disease, unspecified: Secondary | ICD-10-CM | POA: Diagnosis not present

## 2017-06-11 DIAGNOSIS — M5137 Other intervertebral disc degeneration, lumbosacral region: Secondary | ICD-10-CM | POA: Diagnosis not present

## 2017-06-13 DIAGNOSIS — M9905 Segmental and somatic dysfunction of pelvic region: Secondary | ICD-10-CM | POA: Diagnosis not present

## 2017-06-13 DIAGNOSIS — M5137 Other intervertebral disc degeneration, lumbosacral region: Secondary | ICD-10-CM | POA: Diagnosis not present

## 2017-06-14 DIAGNOSIS — M9905 Segmental and somatic dysfunction of pelvic region: Secondary | ICD-10-CM | POA: Diagnosis not present

## 2017-06-14 DIAGNOSIS — M5137 Other intervertebral disc degeneration, lumbosacral region: Secondary | ICD-10-CM | POA: Diagnosis not present

## 2017-06-18 DIAGNOSIS — M9905 Segmental and somatic dysfunction of pelvic region: Secondary | ICD-10-CM | POA: Diagnosis not present

## 2017-06-18 DIAGNOSIS — M5137 Other intervertebral disc degeneration, lumbosacral region: Secondary | ICD-10-CM | POA: Diagnosis not present

## 2017-06-20 DIAGNOSIS — M9905 Segmental and somatic dysfunction of pelvic region: Secondary | ICD-10-CM | POA: Diagnosis not present

## 2017-06-20 DIAGNOSIS — M5137 Other intervertebral disc degeneration, lumbosacral region: Secondary | ICD-10-CM | POA: Diagnosis not present

## 2017-06-21 DIAGNOSIS — M545 Low back pain: Secondary | ICD-10-CM | POA: Diagnosis not present

## 2017-06-21 DIAGNOSIS — M5416 Radiculopathy, lumbar region: Secondary | ICD-10-CM | POA: Diagnosis not present

## 2017-06-25 DIAGNOSIS — M5137 Other intervertebral disc degeneration, lumbosacral region: Secondary | ICD-10-CM | POA: Diagnosis not present

## 2017-06-25 DIAGNOSIS — M9905 Segmental and somatic dysfunction of pelvic region: Secondary | ICD-10-CM | POA: Diagnosis not present

## 2017-06-26 DIAGNOSIS — Z6829 Body mass index (BMI) 29.0-29.9, adult: Secondary | ICD-10-CM | POA: Diagnosis not present

## 2017-06-26 DIAGNOSIS — Z79899 Other long term (current) drug therapy: Secondary | ICD-10-CM | POA: Diagnosis not present

## 2017-06-26 DIAGNOSIS — K5190 Ulcerative colitis, unspecified, without complications: Secondary | ICD-10-CM | POA: Diagnosis not present

## 2017-06-26 DIAGNOSIS — I10 Essential (primary) hypertension: Secondary | ICD-10-CM | POA: Diagnosis not present

## 2017-06-26 DIAGNOSIS — E7800 Pure hypercholesterolemia, unspecified: Secondary | ICD-10-CM | POA: Diagnosis not present

## 2017-06-26 DIAGNOSIS — Z1331 Encounter for screening for depression: Secondary | ICD-10-CM | POA: Diagnosis not present

## 2017-06-26 DIAGNOSIS — Z125 Encounter for screening for malignant neoplasm of prostate: Secondary | ICD-10-CM | POA: Diagnosis not present

## 2017-06-26 DIAGNOSIS — Z1339 Encounter for screening examination for other mental health and behavioral disorders: Secondary | ICD-10-CM | POA: Diagnosis not present

## 2017-06-26 DIAGNOSIS — Z299 Encounter for prophylactic measures, unspecified: Secondary | ICD-10-CM | POA: Diagnosis not present

## 2017-06-26 DIAGNOSIS — Z0000 Encounter for general adult medical examination without abnormal findings: Secondary | ICD-10-CM | POA: Diagnosis not present

## 2017-06-26 DIAGNOSIS — Z7189 Other specified counseling: Secondary | ICD-10-CM | POA: Diagnosis not present

## 2017-06-26 DIAGNOSIS — R5383 Other fatigue: Secondary | ICD-10-CM | POA: Diagnosis not present

## 2017-06-26 DIAGNOSIS — I2510 Atherosclerotic heart disease of native coronary artery without angina pectoris: Secondary | ICD-10-CM | POA: Diagnosis not present

## 2017-06-26 DIAGNOSIS — Z1211 Encounter for screening for malignant neoplasm of colon: Secondary | ICD-10-CM | POA: Diagnosis not present

## 2017-06-27 DIAGNOSIS — M5137 Other intervertebral disc degeneration, lumbosacral region: Secondary | ICD-10-CM | POA: Diagnosis not present

## 2017-06-27 DIAGNOSIS — M9905 Segmental and somatic dysfunction of pelvic region: Secondary | ICD-10-CM | POA: Diagnosis not present

## 2017-06-29 DIAGNOSIS — I10 Essential (primary) hypertension: Secondary | ICD-10-CM | POA: Diagnosis not present

## 2017-06-29 DIAGNOSIS — I2510 Atherosclerotic heart disease of native coronary artery without angina pectoris: Secondary | ICD-10-CM | POA: Diagnosis not present

## 2017-07-05 DIAGNOSIS — M5416 Radiculopathy, lumbar region: Secondary | ICD-10-CM | POA: Diagnosis not present

## 2017-07-10 DIAGNOSIS — M20012 Mallet finger of left finger(s): Secondary | ICD-10-CM | POA: Diagnosis not present

## 2017-07-20 ENCOUNTER — Ambulatory Visit (INDEPENDENT_AMBULATORY_CARE_PROVIDER_SITE_OTHER): Payer: Medicare Other | Admitting: Urology

## 2017-07-20 DIAGNOSIS — N401 Enlarged prostate with lower urinary tract symptoms: Secondary | ICD-10-CM | POA: Diagnosis not present

## 2017-07-24 ENCOUNTER — Other Ambulatory Visit: Payer: Self-pay | Admitting: Urology

## 2017-07-24 DIAGNOSIS — N401 Enlarged prostate with lower urinary tract symptoms: Secondary | ICD-10-CM

## 2017-07-27 ENCOUNTER — Other Ambulatory Visit: Payer: Self-pay | Admitting: Urology

## 2017-08-01 ENCOUNTER — Ambulatory Visit (HOSPITAL_COMMUNITY)
Admission: RE | Admit: 2017-08-01 | Discharge: 2017-08-01 | Disposition: A | Discharge status: Home / Self Care | Payer: Medicare Other | Source: Ambulatory Visit | Attending: Urology | Admitting: Urology

## 2017-08-01 DIAGNOSIS — N401 Enlarged prostate with lower urinary tract symptoms: Secondary | ICD-10-CM

## 2017-08-01 DIAGNOSIS — Z029 Encounter for administrative examinations, unspecified: Secondary | ICD-10-CM | POA: Diagnosis present

## 2017-08-01 MED ORDER — GENTAMICIN SULFATE 40 MG/ML IJ SOLN
INTRAMUSCULAR | Status: AC
  Filled 2017-08-01: qty 2

## 2017-08-01 MED ORDER — GENTAMICIN SULFATE 40 MG/ML IJ SOLN: 80.0000 mg | Freq: Once | INTRAMUSCULAR | Status: DC

## 2017-08-01 MED ORDER — LIDOCAINE HCL (PF) 2 % IJ SOLN
INTRAMUSCULAR | Status: AC
  Filled 2017-08-01: qty 10

## 2017-08-16 NOTE — Patient Instructions (Signed)
Richard Webb  08/16/2017     @PREFPERIOPPHARMACY @   Your procedure is scheduled on 08/27/2017   Report to [facility] at  700   A.M.  Call this number if you have problems the morning of surgery:  [phone]   Remember:  Do not eat food or drink liquids after midnight.  Take these medicines the morning of surgery with A SIP OF WATER  Bentyl, hydrocodone, losartan, mobic, prilosec, sulfasalazine. Use your inhaler before you come and bring your rescue inhaler if you have one.   Do not wear jewelry, make-up or nail polish.  Do not wear lotions, powders, or perfumes, or deodorant.  Do not shave 48 hours prior to surgery.  Men may shave face and neck.  Do not bring valuables to the hospital.  [facility] is not responsible for any belongings or valuables.  Contacts, dentures or bridgework may not be worn into surgery.  Leave your suitcase in the car.  After surgery it may be brought to your room.  For patients admitted to the hospital, discharge time will be determined by your treatment team.  Patients discharged the day of surgery will not be allowed to drive home.   Name and phone number of your driver:   family Special instructions:  None  Please read over the following fact sheets that you were given. Anesthesia Post-op Instructions and Care and Recovery After Surgery       Cystoscopy Cystoscopy is a procedure that is used to help diagnose and sometimes treat conditions that affect that lower urinary tract. The lower urinary tract includes the bladder and the tube that drains urine from the bladder out of the body (urethra). Cystoscopy is performed with a thin, tube-shaped instrument with a light and camera at the end (cystoscope). The cystoscope may be hard (rigid) or flexible, depending on the goal of the procedure.The cystoscope is inserted through the urethra, into the bladder. Cystoscopy may be recommended if you have:  Urinary tractinfections that  keep coming back (recurring).  Blood in the urine (hematuria).  Loss of bladder control (urinary incontinence) or an overactive bladder.  Unusual cells found in a urine sample.  A blockage in the urethra.  Painful urination.  An abnormality in the bladder found during an intravenous pyelogram (IVP) or CT scan.  Cystoscopy may also be done to remove a sample of tissue to be examined under a microscope (biopsy). Tell a health care provider about:  Any allergies you have.  All medicines you are taking, including vitamins, herbs, eye drops, creams, and over-the-counter medicines.  Any problems you or family members have had with anesthetic medicines.  Any blood disorders you have.  Any surgeries you have had.  Any medical conditions you have.  Whether you are pregnant or may be pregnant. What are the risks? Generally, this is a safe procedure. However, problems may occur, including:  Infection.  Bleeding.  Allergic reactions to medicines.  Damage to other structures or organs.  What happens before the procedure?  Ask your health care provider about: ? Changing or stopping your regular medicines. This is especially important if you are taking diabetes medicines or blood thinners. ? Taking medicines such as aspirin and ibuprofen. These medicines can thin your blood. Do not take these medicines before your procedure if your health care provider instructs you not to.  Follow instructions from your health care provider about eating or drinking restrictions.  You may  be given antibiotic medicine to help prevent infection.  You may have an exam or testing, such as X-rays of the bladder, urethra, or kidneys.  You may have urine tests to check for signs of infection.  Plan to have someone take you home after the procedure. What happens during the procedure?  To reduce your risk of infection,your health care team will wash or sanitize their hands.  You will be given one  or more of the following: ? A medicine to help you relax (sedative). ? A medicine to numb the area (local anesthetic).  The area around the opening of your urethra will be cleaned.  The cystoscope will be passed through your urethra into your bladder.  Germ-free (sterile)fluid will flow through the cystoscope to fill your bladder. The fluid will stretch your bladder so that your surgeon can clearly examine your bladder walls.  The cystoscope will be removed and your bladder will be emptied. The procedure may vary among health care providers and hospitals. What happens after the procedure?  You may have some soreness or pain in your abdomen and urethra. Medicines will be available to help you.  You may have some blood in your urine.  Do not drive for 24 hours if you received a sedative. This information is not intended to replace advice given to you by your health care provider. Make sure you discuss any questions you have with your health care provider. Document Released: 08/04/2000 Document Revised: 12/16/2015 Document Reviewed: 06/24/2015 Elsevier Interactive Patient Education  2018 [employer].  Cystoscopy, Care After Refer to this sheet in the next few weeks. These instructions provide you with information about caring for yourself after your procedure. Your health care provider may also give you more specific instructions. Your treatment has been planned according to current medical practices, but problems sometimes occur. Call your health care provider if you have any problems or questions after your procedure. What can I expect after the procedure? After the procedure, it is common to have:  Mild pain when you urinate. Pain should stop within a few minutes after you urinate. This may last for up to 1 week.  A small amount of blood in your urine for several days.  Feeling like you need to urinate but producing only a small amount of urine.  Follow these instructions at  home:  Medicines  Take over-the-counter and prescription medicines only as told by your health care provider.  If you were prescribed an antibiotic medicine, take it as told by your health care provider. Do not stop taking the antibiotic even if you start to feel better. General instructions   Return to your normal activities as told by your health care provider. Ask your health care provider what activities are safe for you.  Do not drive for 24 hours if you received a sedative.  Watch for any blood in your urine. If the amount of blood in your urine increases, call your health care provider.  Follow instructions from your health care provider about eating or drinking restrictions.  If a tissue sample was removed for testing (biopsy) during your procedure, it is your responsibility to get your test results. Ask your health care provider or the department performing the test when your results will be ready.  Drink enough fluid to keep your urine clear or pale yellow.  Keep all follow-up visits as told by your health care provider. This is important. Contact a health care provider if:  You have pain that  gets worse or does not get better with medicine, especially pain when you urinate.  You have difficulty urinating. Get help right away if:  You have more blood in your urine.  You have blood clots in your urine.  You have abdominal pain.  You have a fever or chills.  You are unable to urinate. This information is not intended to replace advice given to you by your health care provider. Make sure you discuss any questions you have with your health care provider. Document Released: 02/24/2005 Document Revised: 01/13/2016 Document Reviewed: 06/24/2015 Elsevier Interactive Patient Education  2018 [employer] Anesthesia, Adult General anesthesia is the use of medicines to make a person "go to sleep" (be unconscious) for a medical procedure. General anesthesia is  often recommended when a procedure:  Is long.  Requires you to be still or in an unusual position.  Is major and can cause you to lose blood.  Is impossible to do without general anesthesia.  The medicines used for general anesthesia are called general anesthetics. In addition to making you sleep, the medicines:  Prevent pain.  Control your blood pressure.  Relax your muscles.  Tell a health care provider about:  Any allergies you have.  All medicines you are taking, including vitamins, herbs, eye drops, creams, and over-the-counter medicines.  Any problems you or family members have had with anesthetic medicines.  Types of anesthetics you have had in the past.  Any bleeding disorders you have.  Any surgeries you have had.  Any medical conditions you have.  Any history of heart or lung conditions, such as heart failure, sleep apnea, or chronic obstructive pulmonary disease (COPD).  Whether you are pregnant or may be pregnant.  Whether you use tobacco, alcohol, marijuana, or street drugs.  Any history of Armed forces logistics/support/administrative officer.  Any history of depression or anxiety. What are the risks? Generally, this is a safe procedure. However, problems may occur, including:  Allergic reaction to anesthetics.  Lung and heart problems.  Inhaling food or liquids from your stomach into your lungs (aspiration).  Injury to nerves.  Waking up during your procedure and being unable to move (rare).  Extreme agitation or a state of mental confusion (delirium) when you wake up from the anesthetic.  Air in the bloodstream, which can lead to stroke.  These problems are more likely to develop if you are having a major surgery or if you have an advanced medical condition. You can prevent some of these complications by answering all of your health care provider's questions thoroughly and by following all pre-procedure instructions. General anesthesia can cause side effects,  including:  Nausea or vomiting  A sore throat from the breathing tube.  Feeling cold or shivery.  Feeling tired, washed out, or achy.  Sleepiness or drowsiness.  Confusion or agitation.  What happens before the procedure? Staying hydrated Follow instructions from your health care provider about hydration, which may include:  Up to 2 hours before the procedure - you may continue to drink clear liquids, such as water, clear fruit juice, black coffee, and plain tea.  Eating and drinking restrictions Follow instructions from your health care provider about eating and drinking, which may include:  8 hours before the procedure - stop eating heavy meals or foods such as meat, fried foods, or fatty foods.  6 hours before the procedure - stop eating light meals or foods, such as toast or cereal.  6 hours before the procedure - stop drinking milk  or drinks that contain milk.  2 hours before the procedure - stop drinking clear liquids.  Medicines  Ask your health care provider about: ? Changing or stopping your regular medicines. This is especially important if you are taking diabetes medicines or blood thinners. ? Taking medicines such as aspirin and ibuprofen. These medicines can thin your blood. Do not take these medicines before your procedure if your health care provider instructs you not to. ? Taking new dietary supplements or medicines. Do not take these during the week before your procedure unless your health care provider approves them.  If you are told to take a medicine or to continue taking a medicine on the day of the procedure, take the medicine with sips of water. General instructions   Ask if you will be going home the same day, the following day, or after a longer hospital stay. ? Plan to have someone take you home. ? Plan to have someone stay with you for the first 24 hours after you leave the hospital or clinic.  For 3-6 weeks before the procedure, try not to use  any tobacco products, such as cigarettes, chewing tobacco, and e-cigarettes.  You may brush your teeth on the morning of the procedure, but make sure to spit out the toothpaste. What happens during the procedure?  You will be given anesthetics through a mask and through an IV tube in one of your veins.  You may receive medicine to help you relax (sedative).  As soon as you are asleep, a breathing tube may be used to help you breathe.  An anesthesia specialist will stay with you throughout the procedure. He or she will help keep you comfortable and safe by continuing to give you medicines and adjusting the amount of medicine that you get. He or she will also watch your blood pressure, pulse, and oxygen levels to make sure that the anesthetics do not cause any problems.  If a breathing tube was used to help you breathe, it will be removed before you wake up. The procedure may vary among health care providers and hospitals. What happens after the procedure?  You will wake up, often slowly, after the procedure is complete, usually in a recovery area.  Your blood pressure, heart rate, breathing rate, and blood oxygen level will be monitored until the medicines you were given have worn off.  You may be given medicine to help you calm down if you feel anxious or agitated.  If you will be going home the same day, your health care provider may check to make sure you can stand, drink, and urinate.  Your health care providers will treat your pain and side effects before you go home.  Do not drive for 24 hours if you received a sedative.  You may: ? Feel nauseous and vomit. ? Have a sore throat. ? Have mental slowness. ? Feel cold or shivery. ? Feel sleepy. ? Feel tired. ? Feel sore or achy, even in parts of your body where you did not have surgery. This information is not intended to replace advice given to you by your health care provider. Make sure you discuss any questions you have with  your health care provider. Document Released: 11/14/2007 Document Revised: 01/18/2016 Document Reviewed: 07/22/2015 Elsevier Interactive Patient Education  2018 [place] Anesthesia, Adult, Care After These instructions provide you with information about caring for yourself after your procedure. Your health care provider may also give you more specific instructions. Your treatment  has been planned according to current medical practices, but problems sometimes occur. Call your health care provider if you have any problems or questions after your procedure. What can I expect after the procedure? After the procedure, it is common to have:  Vomiting.  A sore throat.  Mental slowness.  It is common to feel:  Nauseous.  Cold or shivery.  Sleepy.  Tired.  Sore or achy, even in parts of your body where you did not have surgery.  Follow these instructions at home: For at least 24 hours after the procedure:  Do not: ? Participate in activities where you could fall or become injured. ? Drive. ? Use heavy machinery. ? Drink alcohol. ? Take sleeping pills or medicines that cause drowsiness. ? Make important decisions or sign legal documents. ? Take care of children on your own.  Rest. Eating and drinking  If you vomit, drink water, juice, or soup when you can drink without vomiting.  Drink enough fluid to keep your urine clear or pale yellow.  Make sure you have little or no nausea before eating solid foods.  Follow the diet recommended by your health care provider. General instructions  Have a responsible adult stay with you until you are awake and alert.  Return to your normal activities as told by your health care provider. Ask your health care provider what activities are safe for you.  Take over-the-counter and prescription medicines only as told by your health care provider.  If you smoke, do not smoke without supervision.  Keep all follow-up visits as  told by your health care provider. This is important. Contact a health care provider if:  You continue to have nausea or vomiting at home, and medicines are not helpful.  You cannot drink fluids or start eating again.  You cannot urinate after 8-12 hours.  You develop a skin rash.  You have fever.  You have increasing redness at the site of your procedure. Get help right away if:  You have difficulty breathing.  You have chest pain.  You have unexpected bleeding.  You feel that you are having a life-threatening or urgent problem. This information is not intended to replace advice given to you by your health care provider. Make sure you discuss any questions you have with your health care provider. Document Released: 11/13/2000 Document Revised: 01/10/2016 Document Reviewed: 07/22/2015 Elsevier Interactive Patient Education  [employer].

## 2017-08-20 DIAGNOSIS — K5150 Left sided colitis without complications: Secondary | ICD-10-CM | POA: Diagnosis not present

## 2017-08-20 DIAGNOSIS — R140 Abdominal distension (gaseous): Secondary | ICD-10-CM | POA: Diagnosis not present

## 2017-08-20 DIAGNOSIS — R142 Eructation: Secondary | ICD-10-CM | POA: Diagnosis not present

## 2017-08-22 DIAGNOSIS — M20012 Mallet finger of left finger(s): Secondary | ICD-10-CM | POA: Diagnosis not present

## 2017-08-23 ENCOUNTER — Encounter (HOSPITAL_COMMUNITY)
Admission: RE | Admit: 2017-08-23 | Discharge: 2017-08-23 | Disposition: A | Discharge status: Home / Self Care | Payer: Medicare Other | Source: Ambulatory Visit | Attending: Urology | Admitting: Urology

## 2017-08-23 ENCOUNTER — Other Ambulatory Visit: Payer: Self-pay

## 2017-08-23 ENCOUNTER — Encounter (HOSPITAL_COMMUNITY): Payer: Self-pay

## 2017-08-23 DIAGNOSIS — Z01812 Encounter for preprocedural laboratory examination: Secondary | ICD-10-CM | POA: Insufficient documentation

## 2017-08-23 HISTORY — DX: Gastro-esophageal reflux disease without esophagitis: K21.9

## 2017-08-23 HISTORY — DX: Unspecified osteoarthritis, unspecified site: M19.90

## 2017-08-23 HISTORY — DX: Personal history of urinary calculi: Z87.442

## 2017-08-23 HISTORY — DX: Unspecified asthma, uncomplicated: J45.909

## 2017-08-23 LAB — CBC WITH DIFFERENTIAL/PLATELET
Basophils Absolute: 0 10*3/uL (ref 0.0–0.1)
Basophils Relative: 0 %
EOS ABS: 0.3 10*3/uL (ref 0.0–0.7)
EOS PCT: 4 %
HCT: 41.2 % (ref 39.0–52.0)
HEMOGLOBIN: 13 g/dL (ref 13.0–17.0)
LYMPHS ABS: 1.4 10*3/uL (ref 0.7–4.0)
Lymphocytes Relative: 19 %
MCH: 27.4 pg (ref 26.0–34.0)
MCHC: 31.6 g/dL (ref 30.0–36.0)
MCV: 86.9 fL (ref 78.0–100.0)
MONO ABS: 0.8 10*3/uL (ref 0.1–1.0)
MONOS PCT: 10 %
NEUTROS PCT: 67 %
Neutro Abs: 4.9 10*3/uL (ref 1.7–7.7)
Platelets: 242 10*3/uL (ref 150–400)
RBC: 4.74 MIL/uL (ref 4.22–5.81)
RDW: 15.9 % — AB (ref 11.5–15.5)
WBC: 7.4 10*3/uL (ref 4.0–10.5)

## 2017-08-23 LAB — BASIC METABOLIC PANEL
ANION GAP: 10 (ref 5–15)
BUN: 9 mg/dL (ref 6–20)
CO2: 24 mmol/L (ref 22–32)
Calcium: 9.3 mg/dL (ref 8.9–10.3)
Chloride: 108 mmol/L (ref 101–111)
Creatinine, Ser: 0.93 mg/dL (ref 0.61–1.24)
GLUCOSE: 109 mg/dL — AB (ref 65–99)
POTASSIUM: 3.6 mmol/L (ref 3.5–5.1)
Sodium: 142 mmol/L (ref 135–145)

## 2017-08-27 ENCOUNTER — Ambulatory Visit (HOSPITAL_COMMUNITY)
Admission: RE | Admit: 2017-08-27 | Discharge: 2017-08-27 | Disposition: A | Discharge status: Home / Self Care | Payer: Medicare Other | Source: Ambulatory Visit | Attending: Urology | Admitting: Urology

## 2017-08-27 ENCOUNTER — Encounter (HOSPITAL_COMMUNITY): Payer: Self-pay

## 2017-08-27 ENCOUNTER — Encounter (HOSPITAL_COMMUNITY)
Admission: RE | Disposition: A | Discharge status: Home / Self Care | Payer: Self-pay | Source: Ambulatory Visit | Attending: Urology

## 2017-08-27 ENCOUNTER — Ambulatory Visit (HOSPITAL_COMMUNITY): Payer: Medicare Other | Admitting: Anesthesiology

## 2017-08-27 DIAGNOSIS — K279 Peptic ulcer, site unspecified, unspecified as acute or chronic, without hemorrhage or perforation: Secondary | ICD-10-CM | POA: Diagnosis not present

## 2017-08-27 DIAGNOSIS — Z96651 Presence of right artificial knee joint: Secondary | ICD-10-CM | POA: Insufficient documentation

## 2017-08-27 DIAGNOSIS — G4730 Sleep apnea, unspecified: Secondary | ICD-10-CM | POA: Insufficient documentation

## 2017-08-27 DIAGNOSIS — N138 Other obstructive and reflux uropathy: Secondary | ICD-10-CM | POA: Diagnosis not present

## 2017-08-27 DIAGNOSIS — N401 Enlarged prostate with lower urinary tract symptoms: Secondary | ICD-10-CM | POA: Diagnosis not present

## 2017-08-27 DIAGNOSIS — G4733 Obstructive sleep apnea (adult) (pediatric): Secondary | ICD-10-CM | POA: Diagnosis not present

## 2017-08-27 DIAGNOSIS — G2581 Restless legs syndrome: Secondary | ICD-10-CM | POA: Diagnosis not present

## 2017-08-27 DIAGNOSIS — Z8719 Personal history of other diseases of the digestive system: Secondary | ICD-10-CM | POA: Diagnosis not present

## 2017-08-27 DIAGNOSIS — Z888 Allergy status to other drugs, medicaments and biological substances status: Secondary | ICD-10-CM | POA: Diagnosis not present

## 2017-08-27 DIAGNOSIS — I2510 Atherosclerotic heart disease of native coronary artery without angina pectoris: Secondary | ICD-10-CM | POA: Diagnosis not present

## 2017-08-27 DIAGNOSIS — K219 Gastro-esophageal reflux disease without esophagitis: Secondary | ICD-10-CM | POA: Diagnosis not present

## 2017-08-27 DIAGNOSIS — E782 Mixed hyperlipidemia: Secondary | ICD-10-CM | POA: Diagnosis not present

## 2017-08-27 DIAGNOSIS — D869 Sarcoidosis, unspecified: Secondary | ICD-10-CM | POA: Diagnosis not present

## 2017-08-27 DIAGNOSIS — Z955 Presence of coronary angioplasty implant and graft: Secondary | ICD-10-CM | POA: Diagnosis not present

## 2017-08-27 DIAGNOSIS — N400 Enlarged prostate without lower urinary tract symptoms: Secondary | ICD-10-CM | POA: Diagnosis not present

## 2017-08-27 HISTORY — PX: CYSTOSCOPY WITH INSERTION OF UROLIFT: SHX6678

## 2017-08-27 SURGERY — CYSTOSCOPY WITH INSERTION OF UROLIFT
Anesthesia: General

## 2017-08-27 MED ORDER — ONDANSETRON HCL 4 MG/2ML IJ SOLN
INTRAMUSCULAR | Status: AC
  Filled 2017-08-27: qty 2

## 2017-08-27 MED ORDER — LIDOCAINE HCL (CARDIAC) 10 MG/ML IV SOLN
INTRAVENOUS | Status: DC | PRN
  Administered 2017-08-27: 40 mg via INTRAVENOUS

## 2017-08-27 MED ORDER — PROPOFOL 10 MG/ML IV BOLUS
INTRAVENOUS | Status: DC | PRN
  Administered 2017-08-27: 30 mg via INTRAVENOUS
  Administered 2017-08-27: 160 mg via INTRAVENOUS

## 2017-08-27 MED ORDER — LACTATED RINGERS IV SOLN
INTRAVENOUS | Status: DC
  Administered 2017-08-27: 08:00:00 via INTRAVENOUS

## 2017-08-27 MED ORDER — ONDANSETRON HCL 4 MG/2ML IJ SOLN
4.0000 mg | Freq: Once | INTRAMUSCULAR | Status: AC
  Administered 2017-08-27: 4 mg via INTRAVENOUS

## 2017-08-27 MED ORDER — CEFAZOLIN SODIUM-DEXTROSE 2-4 GM/100ML-% IV SOLN
INTRAVENOUS | Status: AC
  Filled 2017-08-27: qty 100

## 2017-08-27 MED ORDER — MIDAZOLAM HCL 2 MG/2ML IJ SOLN
1.0000 mg | INTRAMUSCULAR | Status: AC
  Administered 2017-08-27 (×2): 2 mg via INTRAVENOUS
  Filled 2017-08-27: qty 2

## 2017-08-27 MED ORDER — FENTANYL CITRATE (PF) 250 MCG/5ML IJ SOLN
INTRAMUSCULAR | Status: AC
  Filled 2017-08-27: qty 5

## 2017-08-27 MED ORDER — FENTANYL CITRATE (PF) 100 MCG/2ML IJ SOLN
INTRAMUSCULAR | Status: DC | PRN
  Administered 2017-08-27: 50 ug via INTRAVENOUS
  Administered 2017-08-27: 25 ug via INTRAVENOUS
  Administered 2017-08-27 (×3): 50 ug via INTRAVENOUS

## 2017-08-27 MED ORDER — SUCCINYLCHOLINE CHLORIDE 20 MG/ML IJ SOLN
INTRAMUSCULAR | Status: AC
  Filled 2017-08-27: qty 2

## 2017-08-27 MED ORDER — CEFAZOLIN SODIUM-DEXTROSE 2-4 GM/100ML-% IV SOLN
2.0000 g | INTRAVENOUS | Status: AC
  Administered 2017-08-27: 2 g via INTRAVENOUS

## 2017-08-27 MED ORDER — MIDAZOLAM HCL 2 MG/2ML IJ SOLN
INTRAMUSCULAR | Status: AC
  Filled 2017-08-27: qty 2

## 2017-08-27 MED ORDER — TRAMADOL HCL 50 MG PO TABS: 50.0000 mg | ORAL_TABLET | Freq: Four times a day (QID) | ORAL | 0 refills | Status: DC | PRN

## 2017-08-27 MED ORDER — STERILE WATER FOR IRRIGATION IR SOLN
Status: DC | PRN
  Administered 2017-08-27: 3000 mL
  Administered 2017-08-27: 500 mL

## 2017-08-27 MED ORDER — FENTANYL CITRATE (PF) 100 MCG/2ML IJ SOLN: 25.0000 ug | INTRAMUSCULAR | Status: DC | PRN

## 2017-08-27 SURGICAL SUPPLY — 17 items
BAG DRAIN URO TABLE W/ADPT NS (DRAPE) ×3 IMPLANT
BAG HAMPER (MISCELLANEOUS) ×3 IMPLANT
BAG URINE DRAINAGE (UROLOGICAL SUPPLIES) ×3 IMPLANT
CLOTH BEACON ORANGE TIMEOUT ST (SAFETY) ×3 IMPLANT
GLOVE BIO SURGEON STRL SZ8 (GLOVE) ×3 IMPLANT
GLOVE BIOGEL PI IND STRL 7.0 (GLOVE) ×2 IMPLANT
GLOVE BIOGEL PI INDICATOR 7.0 (GLOVE) ×4
GLOVE ECLIPSE 6.5 STRL STRAW (GLOVE) ×3 IMPLANT
GOWN STRL REUS W/TWL LRG LVL3 (GOWN DISPOSABLE) ×3 IMPLANT
GOWN STRL REUS W/TWL XL LVL3 (GOWN DISPOSABLE) ×3 IMPLANT
IV NS IRRIG 3000ML ARTHROMATIC (IV SOLUTION) ×3 IMPLANT
KIT ROOM TURNOVER AP CYSTO (KITS) ×3 IMPLANT
MANIFOLD NEPTUNE II (INSTRUMENTS) ×3 IMPLANT
PACK CYSTO (CUSTOM PROCEDURE TRAY) ×3 IMPLANT
PAD ARMBOARD 7.5X6 YLW CONV (MISCELLANEOUS) ×3 IMPLANT
SYSTEM UROLIFT (Male Continence) ×15 IMPLANT
TRAY FOLEY CATH SILVER 16FR (SET/KITS/TRAYS/PACK) ×3 IMPLANT

## 2017-08-27 NOTE — Transfer of Care (Signed)
Immediate Anesthesia Transfer of Care Note  Patient: Richard Webb  Procedure(s) Performed: CYSTOSCOPY WITH INSERTION OF UROLIFT (N/A )  Patient Location: PACU  Anesthesia Type:General  Level of Consciousness: awake and alert   Airway & Oxygen Therapy: Patient Spontanous Breathing and Patient connected to nasal cannula oxygen  Post-op Assessment: Report given to RN  Post vital signs: Reviewed and stable  Last Vitals:  Vitals:   08/27/17 0900 08/27/17 0915  BP: (!) 141/87 (!) 132/93  Pulse:    Resp: (!) 21 17  Temp:    SpO2: 91% 94%    Last Pain:  Vitals:   08/27/17 0734  TempSrc: Oral         Complications: No apparent anesthesia complications

## 2017-08-27 NOTE — Anesthesia Postprocedure Evaluation (Signed)
Anesthesia Post Note  Patient: Richard Webb  Procedure(s) Performed: CYSTOSCOPY WITH INSERTION OF UROLIFT (N/A )  Patient location during evaluation: PACU Anesthesia Type: General Level of consciousness: awake and alert and oriented Pain management: pain level controlled Vital Signs Assessment: post-procedure vital signs reviewed and stable Respiratory status: spontaneous breathing Cardiovascular status: blood pressure returned to baseline Postop Assessment: no apparent nausea or vomiting Anesthetic complications: no Comments: Late entry     Last Vitals:  Vitals:   08/27/17 1100 08/27/17 1113  BP: (!) 145/90 139/77  Pulse: 62 63  Resp: 11   Temp:  36.4 C  SpO2: 97% 94%    Last Pain:  Vitals:   08/27/17 1113  TempSrc: Oral  PainSc: 6                  Treylan Mcclintock

## 2017-08-27 NOTE — Discharge Instructions (Signed)
Indwelling Urinary Catheter Care, Adult  Take good care of your catheter to keep it working and to prevent problems.  How to wear your catheter  Attach your catheter to your leg with tape (adhesive tape) or a leg strap. Make sure it is not too tight. If you use tape, remove any bits of tape that are already on the catheter.  How to wear a drainage bag  You should have:   A large overnight bag.   A small leg bag.    Overnight Bag  You may wear the overnight bag at any time. Always keep the bag below the level of your bladder but off the floor. When you sleep, put a clean plastic bag in a wastebasket. Then hang the bag inside the wastebasket.  Leg Bag  Never wear the leg bag at night. Always wear the leg bag below your knee. Keep the leg bag secure with a leg strap or tape.  How to care for your skin   Clean the skin around the catheter at least once every day.   Shower every day. Do not take baths.   Put creams, lotions, or ointments on your genital area only as told by your doctor.   Do not use powders, sprays, or lotions on your genital area.  How to clean your catheter and your skin  1. Wash your hands with soap and water.  2. Wet a washcloth in warm water and gentle (mild) soap.  3. Use the washcloth to clean the skin where the catheter enters your body. Clean downward and wipe away from the catheter in small circles. Do not wipe toward the catheter.  4. Pat the area dry with a clean towel. Make sure to clean off all soap.  How to care for your drainage bags  Empty your drainage bag when it is ?- full or at least 2-3 times a day. Replace your drainage bag once a month or sooner if it starts to smell bad or look dirty. Do not clean your drainage bag unless told by your doctor.  Emptying a drainage bag    Supplies Needed   Rubbing alcohol.   Gauze pad or cotton ball.   Tape or a leg strap.    Steps  1. Wash your hands with soap and water.  2. Separate (detach) the bag from your leg.  3. Hold the bag over  the toilet or a clean container. Keep the bag below your hips and bladder. This stops pee (urine) from going back into the tube.  4. Open the pour spout at the bottom of the bag.  5. Empty the pee into the toilet or container. Do not let the pour spout touch any surface.  6. Put rubbing alcohol on a gauze pad or cotton ball.  7. Use the gauze pad or cotton ball to clean the pour spout.  8. Close the pour spout.  9. Attach the bag to your leg with tape or a leg strap.  10. Wash your hands.    Changing a drainage bag  Supplies Needed   Alcohol wipes.   A clean drainage bag.   Adhesive tape or a leg strap.    Steps  1. Wash your hands with soap and water.  2. Separate the dirty bag from your leg.  3. Pinch the rubber catheter with your fingers so that pee does not spill out.  4. Separate the catheter tube from the drainage tube where these tubes connect (at the   connection valve). Do not let the tubes touch any surface.  5. Clean the end of the catheter tube with an alcohol wipe. Use a different alcohol wipe to clean the end of the drainage tube.  6. Connect the catheter tube to the drainage tube of the clean bag.  7. Attach the new bag to the leg with adhesive tape or a leg strap.  8. Wash your hands.    How to prevent infection and other problems   Never pull on your catheter or try to remove it. Pulling can damage tissue in your body.   Always wash your hands before and after touching your catheter.   If a leg strap gets wet, replace it with a dry one.   Drink enough fluids to keep your pee clear or pale yellow, or as told by your doctor.   Do not let the drainage bag or tubing touch the floor.   Wear cotton underwear.   If you are male, wipe from front to back after you poop (have a bowel movement).   Check on the catheter often to make sure it works and the tubing is not twisted.  Get help if:   Your pee is cloudy.   Your pee smells unusually bad.   Your pee is not draining into the bag.   Your  tube gets clogged.   Your catheter starts to leak.   Your bladder feels full.  Get help right away if:   You have redness, swelling, or pain where the catheter enters your body.   You have fluid, pus, or a bad smell coming from the area where the catheter enters your body.   The area where the catheter enters your body feels warm.   You have a fever.   You have pain in your:  ? Stomach (abdomen).  ? Legs.  ? Lower back.  ? Bladder.   You see blood fill the catheter.   Your pee is pink or red.   You feel sick to your stomach (nauseous).   You throw up (vomit).   You have chills.   Your catheter gets pulled out.  This information is not intended to replace advice given to you by your health care provider. Make sure you discuss any questions you have with your health care provider.  Document Released: 12/02/2012 Document Revised: 07/05/2016 Document Reviewed: 01/20/2014  Elsevier Interactive Patient Education  2018 [employer]

## 2017-08-27 NOTE — Op Note (Signed)
   PREOPERATIVE DIAGNOSIS: Benign prostatic hypertrophy with bladder outlet obstruction.  POSTOPERATIVE DIAGNOSIS: Benign prostatic hypertrophy with bladder outlet obstruction.  PROCEDURE: Cystoscopy with implantation of UroLift devices, 4 implants.  SURGEON: Nicolette Bang, M.D.  ANESTHESIA: General  ANTIBIOTICS: ancef  SPECIMEN: None.  DRAINS: A 16-French Foley catheter.  BLOOD LOSS: Minimal.  COMPLICATIONS: None.  INDICATIONS:The Patient is an 72 year old male with BPH and bladder outlet obstruction. He has failed medical therapy and has elected UroLift for definitive treatment.  FINDINGS OF PROCEDURE: He was taken to the operating room where a genral anesthetic was induced. He was placed in lithotomy position and was fitted with PAS hose. His perineum and genitalia were prepped with chlorhexidine, and he was draped in usual sterile fashion.  Cystoscopy was performed using the UroLift scope and 0 degree lens. Examination revealed a normal urethra. The external sphincter was intact. Prostatic urethra was approximately 4 cm in length with lateral lobe enlargement. There was also little bit of bladder neck elevation. Inspection of bladder revealed mild-to-moderate trabeculation with no tumors, stones, or inflammation. No cellules or diverticula were noted. Ureteral orifices were in their normal anatomic position effluxing clear urine.  After initial cystoscopy, the visual obturator was replaced with the first UroLift device. This was turned to the 9 o'clock position and pulled back to the veru and then slightly advanced. Pressure was then applied to the right lateral lobe and the UroLift device was deployed.  The second UroLift device was then inserted and applied to the left lateral lobe at 3 o'clock and deployed in the mid prostatic urethra. After this, there was still some apparent obstruction closer to the bladder neck. So a second level of  UroLift your left device was applied between the mid urethra and the proximal urethra providing further patency to the prostatic urethra. At this point, there was mild bleeding but the patient did have a spinal anesthetic. So it was thought that a Foley catheter was indicated. The scope was removed and a 16-French Foley catheter was inserted without difficulty. The balloon was filled with 10 mL sterile fluid, and the catheter was placed to straight drainage.  COMPLICATIONS: None   CONDITION: Stable, extubated, transferred to PACU  PLAN: The patient will be discharged home and followup in 2 days for a voiding trial.

## 2017-08-27 NOTE — Anesthesia Preprocedure Evaluation (Signed)
Anesthesia Evaluation  Patient identified by MRN, date of birth, ID band Patient awake    Reviewed: Allergy & Precautions, NPO status , Patient's Chart, lab work & pertinent test results  Airway Mallampati: I  TM Distance: >3 FB Neck ROM: Full    Dental  (+) Teeth Intact   Pulmonary asthma , sleep apnea ,    breath sounds clear to auscultation       Cardiovascular + CAD and + Cardiac Stents   Rhythm:Regular Rate:Normal     Neuro/Psych    GI/Hepatic PUD, GERD  ,  Endo/Other    Renal/GU      Musculoskeletal   Abdominal   Peds  Hematology   Anesthesia Other Findings   Reproductive/Obstetrics                             Anesthesia Physical Anesthesia Plan  ASA: III  Anesthesia Plan: General   Post-op Pain Management:    Induction: Intravenous  PONV Risk Score and Plan:   Airway Management Planned: LMA  Additional Equipment:   Intra-op Plan:   Post-operative Plan: Extubation in OR  Informed Consent: I have reviewed the patients History and Physical, chart, labs and discussed the procedure including the risks, benefits and alternatives for the proposed anesthesia with the patient or authorized representative who has indicated his/her understanding and acceptance.     Plan Discussed with:   Anesthesia Plan Comments:         Anesthesia Quick Evaluation

## 2017-08-27 NOTE — Anesthesia Procedure Notes (Signed)
Procedure Name: LMA Insertion Date/Time: 08/27/2017 9:31 AM Performed by: Ollen Bowl, CRNA Pre-anesthesia Checklist: Patient identified, Patient being monitored, Emergency Drugs available, Timeout performed and Suction available Patient Re-evaluated:Patient Re-evaluated prior to induction Oxygen Delivery Method: Circle System Utilized Preoxygenation: Pre-oxygenation with 100% oxygen Induction Type: IV induction Ventilation: Mask ventilation without difficulty LMA: LMA inserted LMA Size: 4.0 Number of attempts: 1 Placement Confirmation: positive ETCO2 and breath sounds checked- equal and bilateral

## 2017-08-27 NOTE — H&P (Signed)
Urology Admission H&P  Chief Complaint: Urinary urgency  History of Present Illness: Richard Webb is a 72yo with a hx of BPH who has failed medical therapy. He underwent UDS which showed an obstructive pattern. He has refractory urinary urgency and frequency.  Past Medical History:  Diagnosis Date  . Arthritis   . Asthma   . Coronary atherosclerosis of native coronary artery    DES x 3 RCA and DES OM 10/11 - [place]  . GERD (gastroesophageal reflux disease)   . History of kidney stones   . Mixed hyperlipidemia   . Obstructive sleep apnea   . Pulmonary hypertension (HCC)    PASP 50 mmHg  . Restless leg syndrome   . Sarcoidosis   . Ulcerative colitis [facility])    Past Surgical History:  Procedure Laterality Date  . Ankle fracture repair Left 1985  . CARDIAC CATHETERIZATION     stents x4  . CHOLECYSTECTOMY  1970  . LAPAROSCOPIC APPENDECTOMY  2013  . Right total knee replacement Bilateral 2005    Home Medications:  Current Facility-Administered Medications  Medication Dose Route Frequency Provider Last Rate Last Dose  . ceFAZolin (ANCEF) IVPB 2g/100 mL premix  2 g Intravenous 30 min Pre-Op McKenzie, Candee Furbish, MD      . lactated ringers infusion   Intravenous Continuous Lerry Liner, MD 75 mL/hr at 08/27/17 8921     Allergies:  Allergies  Allergen Reactions  . Crestor [Rosuvastatin] Other (See Comments)    myalgia  . Simvastatin Other (See Comments)    myalgia    Family History  Problem Relation Age of Onset  . Colon cancer Mother   . CAD Father    Social History:  reports that  has never smoked. he has never used smokeless tobacco. He reports that he does not drink alcohol or use drugs.  Review of Systems  Genitourinary: Positive for frequency and urgency.  All other systems reviewed and are negative.   Physical Exam:  Vital signs in last 24 hours: Temp:  [98.1 F (36.7 C)] 98.1 F (36.7 C) (01/07 0734) Pulse Rate:  [72] 72 (01/07 0734) Resp:  [12-22] 16  (01/07 0845) BP: (129-145)/(79-95) 138/79 (01/07 0845) SpO2:  [93 %-98 %] 97 % (01/07 0845) Physical Exam  Constitutional: He is oriented to person, place, and time. He appears well-developed and well-nourished.  HENT:  Head: Normocephalic and atraumatic.  Eyes: EOM are normal. Pupils are equal, round, and reactive to light.  Neck: Normal range of motion. No thyromegaly present.  Cardiovascular: Normal rate and regular rhythm.  Respiratory: Effort normal. No respiratory distress.  GI: Soft. He exhibits no distension.  Musculoskeletal: Normal range of motion. He exhibits no edema.  Neurological: He is alert and oriented to person, place, and time.  Skin: Skin is warm and dry.  Psychiatric: He has a normal mood and affect. His behavior is normal. Judgment and thought content normal.    Laboratory Data:  No results found for this or any previous visit (from the past 24 hour(s)). No results found for this or any previous visit (from the past 240 hour(s)). Creatinine: Recent Labs    08/23/17 1440  CREATININE 0.93   Baseline Creatinine: 0.9  Impression/Assessment:  71yo with BPH refractory to medical therapy  Plan:  The risks/benefits/alternatives to Urolift implantation was explained to the patient and he understands and wishes to proceed with surgery  Richard Webb 08/27/2017, 8:57 AM

## 2017-08-28 ENCOUNTER — Encounter (HOSPITAL_COMMUNITY): Payer: Self-pay | Admitting: Urology

## 2017-08-29 ENCOUNTER — Encounter (HOSPITAL_COMMUNITY): Payer: Self-pay | Admitting: Urology

## 2017-08-29 ENCOUNTER — Ambulatory Visit (INDEPENDENT_AMBULATORY_CARE_PROVIDER_SITE_OTHER): Payer: Medicare Other | Admitting: Urology

## 2017-08-29 DIAGNOSIS — N401 Enlarged prostate with lower urinary tract symptoms: Secondary | ICD-10-CM | POA: Diagnosis not present

## 2017-08-30 ENCOUNTER — Emergency Department (HOSPITAL_COMMUNITY)
Admission: EM | Admit: 2017-08-30 | Discharge: 2017-08-31 | Disposition: A | Discharge status: Home / Self Care | Payer: Medicare Other | Attending: Emergency Medicine | Admitting: Emergency Medicine

## 2017-08-30 ENCOUNTER — Other Ambulatory Visit: Payer: Self-pay

## 2017-08-30 ENCOUNTER — Encounter (HOSPITAL_COMMUNITY): Payer: Self-pay | Admitting: Adult Health

## 2017-08-30 DIAGNOSIS — R3914 Feeling of incomplete bladder emptying: Secondary | ICD-10-CM | POA: Diagnosis not present

## 2017-08-30 DIAGNOSIS — R319 Hematuria, unspecified: Secondary | ICD-10-CM

## 2017-08-30 DIAGNOSIS — R339 Retention of urine, unspecified: Secondary | ICD-10-CM | POA: Diagnosis not present

## 2017-08-30 DIAGNOSIS — I270 Primary pulmonary hypertension: Secondary | ICD-10-CM | POA: Diagnosis not present

## 2017-08-30 DIAGNOSIS — Z79899 Other long term (current) drug therapy: Secondary | ICD-10-CM | POA: Insufficient documentation

## 2017-08-30 DIAGNOSIS — R350 Frequency of micturition: Secondary | ICD-10-CM | POA: Diagnosis not present

## 2017-08-30 DIAGNOSIS — I2510 Atherosclerotic heart disease of native coronary artery without angina pectoris: Secondary | ICD-10-CM | POA: Insufficient documentation

## 2017-08-30 DIAGNOSIS — R300 Dysuria: Secondary | ICD-10-CM | POA: Diagnosis not present

## 2017-08-30 DIAGNOSIS — Z7982 Long term (current) use of aspirin: Secondary | ICD-10-CM | POA: Diagnosis not present

## 2017-08-30 DIAGNOSIS — J45909 Unspecified asthma, uncomplicated: Secondary | ICD-10-CM | POA: Insufficient documentation

## 2017-08-30 NOTE — ED Provider Notes (Signed)
[facility] EMERGENCY DEPARTMENT Provider Note   CSN: [id number] Arrival date & time: 08/30/17  2231     History   Chief Complaint Chief Complaint  Patient presents with  . Hematuria    HPI Richard Webb is a 72 y.o. male.  Patient presents with hematuria, clots from his urethra, dysuria since this evening.  Patient underwent cystoscopy with urolift placement on January 7 by Dr. Alyson Ingles.  Catheter was removed today in the urology office.  Patient was given a shot of antibiotics and started on Bactrim for possible infection.  He states since his office visit he has been having difficulty with urination only able to get out a few clots and having significant pain when he tries to urinate.  He called the urologist and was told to come to the ED.  Denies fever, chills, nausea or vomiting.  No testicular pain.  He is on aspirin but no other anticoagulants.   The history is provided by the patient.  Hematuria  Pertinent negatives include no chest pain, no abdominal pain, no headaches and no shortness of breath.    Past Medical History:  Diagnosis Date  . Arthritis   . Asthma   . Coronary atherosclerosis of native coronary artery    DES x 3 RCA and DES OM 10/11 - [place]  . GERD (gastroesophageal reflux disease)   . History of kidney stones   . Mixed hyperlipidemia   . Obstructive sleep apnea   . Pulmonary hypertension (HCC)    PASP 50 mmHg  . Restless leg syndrome   . Sarcoidosis   . Ulcerative colitis [facility])     Patient Active Problem List   Diagnosis Date Noted  . Coronary atherosclerosis of native coronary artery 10/10/2012  . Mixed hyperlipidemia 10/10/2012    Past Surgical History:  Procedure Laterality Date  . Ankle fracture repair Left 1985  . CARDIAC CATHETERIZATION     stents x4  . CHOLECYSTECTOMY  1970  . CYSTOSCOPY WITH INSERTION OF UROLIFT N/A 08/27/2017   Procedure: CYSTOSCOPY WITH INSERTION OF UROLIFT;  Surgeon: Cleon Gustin, MD;  Location: [facility];  Service: Urology;  Laterality: N/A;  . LAPAROSCOPIC APPENDECTOMY  2013  . Right total knee replacement Bilateral 2005       Home Medications    Prior to Admission medications   Medication Sig Start Date End Date Taking? Authorizing Provider  aspirin 81 MG tablet Take 81 mg by mouth daily.    [provider]  budesonide-formoterol (SYMBICORT) 160-4.5 MCG/ACT inhaler Inhale 2 puffs into the lungs 2 (two) times daily.    [provider]  carbidopa-levodopa (SINEMET IR) 10-100 MG per tablet Take 1-2 tablets by mouth at bedtime.     [provider]  Cyanocobalamin (B-12) 1000 MCG/ML KIT Inject 1,000 mcg as directed every 30 (thirty) days.    [provider]  dicyclomine (BENTYL) 10 MG capsule Take 1 capsule by mouth 4 (four) times daily as needed.  10/04/12   [provider]  EPINEPHrine (EPIPEN JR) 0.15 MG/0.3ML injection Inject 0.15 mg into the muscle as needed.    [provider]  HYDROcodone-acetaminophen (NORCO/VICODIN) 5-325 MG tablet Take 1 tablet by mouth every 8 (eight) hours as needed for pain.  07/04/12   [provider]  losartan (COZAAR) 25 MG tablet Take 25 mg by mouth daily.    [provider]  meloxicam (MOBIC) 7.5 MG tablet Take 7.5 mg by mouth daily.     [provider]  montelukast (SINGULAIR) 10 MG tablet Take 10 mg by mouth at bedtime.    [provider]  nitroGLYCERIN (NITROSTAT) 0.4 MG SL tablet Place 1 tablet (0.4 mg total) under the tongue every 5 (five) minutes as needed for chest pain. 03/07/13   Satira Sark, MD  NON FORMULARY CPAP UAD    [provider]  omeprazole (PRILOSEC) 20 MG capsule Take 20 mg by mouth daily.    [provider]  sulfaSALAzine (AZULFIDINE) 500 MG tablet Take 1,000 mg by mouth 2 (two) times daily.    [provider]  temazepam (RESTORIL) 30 MG capsule Take 30 mg by mouth at bedtime as needed for sleep.     [provider]  traMADol (ULTRAM) 50 MG tablet Take 1 tablet (50 mg total) by mouth every 6 (six) hours as needed. 08/27/17 08/27/18  Cleon Gustin, MD  Vitamin D, Ergocalciferol, (DRISDOL) 50000 UNITS CAPS capsule Take 50,000 Units by mouth every 7 (seven) days. fridays    [provider]    Family History Family History  Problem Relation Age of Onset  . Colon cancer Mother   . CAD Father     Social History Social History   Tobacco Use  . Smoking status: Never Smoker  . Smokeless tobacco: Never Used  Substance Use Topics  . Alcohol use: No  . Drug use: No     Allergies   Crestor [rosuvastatin] and Simvastatin   Review of Systems Review of Systems  Constitutional: Negative for activity change, appetite change and fever.  Respiratory: Negative for cough, chest tightness and shortness of breath.   Cardiovascular: Negative for chest pain.  Gastrointestinal: Negative for abdominal pain, nausea and vomiting.  Genitourinary: Positive for decreased urine volume, difficulty urinating, dysuria, frequency, hematuria and urgency. Negative for flank pain, penile pain, penile swelling, scrotal swelling and testicular pain.  Musculoskeletal: Negative for arthralgias and myalgias.  Skin: Negative for rash.  Neurological: Negative for dizziness, tremors, light-headedness and headaches.   all other systems are negative except as noted in the HPI and PMH.     Physical Exam Updated Vital Signs BP (!) 171/82   Pulse 81   Resp (!) 22   SpO2 98%   Physical Exam  Constitutional: He is oriented to person, place, and time. He appears well-developed and well-nourished. No distress.  HENT:  Head: Normocephalic and atraumatic.  Mouth/Throat: Oropharynx is clear and moist. No oropharyngeal exudate.  Eyes: Conjunctivae and EOM are normal. Pupils are equal, round, and reactive to light.  Neck: Normal range of motion. Neck supple.  No meningismus.  Cardiovascular: Normal rate,  regular rhythm, normal heart sounds and intact distal pulses.  No murmur heard. Pulmonary/Chest: Effort normal and breath sounds normal. No respiratory distress.  Abdominal: Soft. There is no tenderness. There is no rebound and no guarding.  Genitourinary:  Genitourinary Comments: Blood at urethral meatus. No testicular tenderness  Musculoskeletal: Normal range of motion. He exhibits no edema or tenderness.  Neurological: He is alert and oriented to person, place, and time. No cranial nerve deficit. He exhibits normal muscle tone. Coordination normal.   5/5 strength throughout. CN 2-12 intact.Equal grip strength.   Skin: Skin is warm.  Psychiatric: He has a normal mood and affect. His behavior is normal.  Nursing note and vitals reviewed.    ED Treatments / Results  Labs (all labs ordered are listed, but only abnormal results are displayed) Labs Reviewed  URINALYSIS, ROUTINE W REFLEX MICROSCOPIC - Abnormal;  Notable for the following components:      Result Value   Color, Urine AMBER (*)    APPearance CLOUDY (*)    Hgb urine dipstick LARGE (*)    Protein, ur 100 (*)    All other components within normal limits  URINE CULTURE    EKG  EKG Interpretation None       Radiology No results found.  Procedures Procedures (including critical care time)  Medications Ordered in ED Medications - No data to display   Initial Impression / Assessment and Plan / ED Course  I have reviewed the triage vital signs and the nursing notes.  Pertinent labs & imaging results that were available during my care of the patient were reviewed by me and considered in my medical decision making (see chart for details).    Patient presents with difficulty urinating, hematuria and dysuria since this afternoon.  Status post cystoscopy with urolift procedure on January 7 by Dr. Alyson Ingles  Bladder scan 285 mL.  Patient able to void minimal bloody urine.   Discussed with Dr. Karsten Ro of urology who  agrees with replacement of Foley catheter.  No concerns given recent procedure.  Foley placed by nursing staff with drainage of ~300 cc of dark urine. Irrigated clear.  UA and culture sent.   UA with hematuria.  Patient is already taking Bactrim from his urologist.  Will discharge with Foley catheter in place.  Follow-up with urology.  Return precautions discussed. Final Clinical Impressions(s) / ED Diagnoses   Final diagnoses:  Urinary retention  Hematuria, unspecified type    ED Discharge Orders    None       Hang Ammon, Annie Main, MD 08/31/17 [phone]

## 2017-08-30 NOTE — ED Triage Notes (Signed)
Presents post urolift by Dr. Alyson Ingles on Monday, had catheter removed yesterday. Last night began having urinary retention and hematuria, went back to urology and was told he had an infection. This evening passing very large clots at a time and called his uroiogist. Was told to come here for further evaluation

## 2017-08-30 NOTE — ED Notes (Signed)
ED Provider at bedside. 

## 2017-08-30 NOTE — ED Notes (Signed)
Bladder Scan performed after Pt attempted to void. 258m residual urine in the Pt's bladder was measured.

## 2017-08-30 NOTE — ED Notes (Signed)
Pt is pain free at the moment but reports pain becomes sever when he attempts to urinate and describes it as "the head of my penis feels like its about to explode when I try to push and urinate."

## 2017-08-31 DIAGNOSIS — R339 Retention of urine, unspecified: Secondary | ICD-10-CM | POA: Diagnosis not present

## 2017-08-31 LAB — URINALYSIS, ROUTINE W REFLEX MICROSCOPIC
Bacteria, UA: NONE SEEN
Bilirubin Urine: NEGATIVE
Glucose, UA: NEGATIVE mg/dL
Ketones, ur: NEGATIVE mg/dL
Leukocytes, UA: NEGATIVE
Nitrite: NEGATIVE
Protein, ur: 100 mg/dL — AB
Specific Gravity, Urine: 1.018 (ref 1.005–1.030)
Squamous Epithelial / LPF: NONE SEEN
pH: 5 (ref 5.0–8.0)

## 2017-08-31 NOTE — Discharge Instructions (Signed)
Keep the Foley in place until you follow-up with the urologist.  Continue to take the antibiotics as prescribed.  Return to the ED if you develop worsening symptoms.

## 2017-09-01 LAB — URINE CULTURE: Culture: NO GROWTH

## 2017-09-05 ENCOUNTER — Ambulatory Visit (INDEPENDENT_AMBULATORY_CARE_PROVIDER_SITE_OTHER): Payer: Medicare Other | Admitting: Urology

## 2017-09-05 DIAGNOSIS — N401 Enlarged prostate with lower urinary tract symptoms: Secondary | ICD-10-CM | POA: Diagnosis not present

## 2017-09-06 DIAGNOSIS — K5150 Left sided colitis without complications: Secondary | ICD-10-CM | POA: Diagnosis not present

## 2017-09-07 ENCOUNTER — Ambulatory Visit (INDEPENDENT_AMBULATORY_CARE_PROVIDER_SITE_OTHER): Payer: Medicare Other | Admitting: Urology

## 2017-09-07 DIAGNOSIS — N401 Enlarged prostate with lower urinary tract symptoms: Secondary | ICD-10-CM

## 2017-09-12 DIAGNOSIS — M5416 Radiculopathy, lumbar region: Secondary | ICD-10-CM | POA: Diagnosis not present

## 2017-09-14 DIAGNOSIS — K5150 Left sided colitis without complications: Secondary | ICD-10-CM | POA: Diagnosis not present

## 2017-09-19 DIAGNOSIS — M545 Low back pain: Secondary | ICD-10-CM | POA: Diagnosis not present

## 2017-09-19 DIAGNOSIS — M5410 Radiculopathy, site unspecified: Secondary | ICD-10-CM | POA: Diagnosis not present

## 2017-09-19 DIAGNOSIS — M5416 Radiculopathy, lumbar region: Secondary | ICD-10-CM | POA: Diagnosis not present

## 2017-09-26 DIAGNOSIS — J479 Bronchiectasis, uncomplicated: Secondary | ICD-10-CM | POA: Diagnosis not present

## 2017-09-26 DIAGNOSIS — E559 Vitamin D deficiency, unspecified: Secondary | ICD-10-CM | POA: Diagnosis not present

## 2017-09-26 DIAGNOSIS — G4700 Insomnia, unspecified: Secondary | ICD-10-CM | POA: Diagnosis not present

## 2017-09-26 DIAGNOSIS — E538 Deficiency of other specified B group vitamins: Secondary | ICD-10-CM | POA: Diagnosis not present

## 2017-09-26 DIAGNOSIS — Z6829 Body mass index (BMI) 29.0-29.9, adult: Secondary | ICD-10-CM | POA: Diagnosis not present

## 2017-09-26 DIAGNOSIS — Z789 Other specified health status: Secondary | ICD-10-CM | POA: Diagnosis not present

## 2017-09-26 DIAGNOSIS — I10 Essential (primary) hypertension: Secondary | ICD-10-CM | POA: Diagnosis not present

## 2017-09-26 DIAGNOSIS — Z299 Encounter for prophylactic measures, unspecified: Secondary | ICD-10-CM | POA: Diagnosis not present

## 2017-09-26 DIAGNOSIS — K5190 Ulcerative colitis, unspecified, without complications: Secondary | ICD-10-CM | POA: Diagnosis not present

## 2017-10-11 DIAGNOSIS — Z6829 Body mass index (BMI) 29.0-29.9, adult: Secondary | ICD-10-CM | POA: Diagnosis not present

## 2017-10-11 DIAGNOSIS — I10 Essential (primary) hypertension: Secondary | ICD-10-CM | POA: Diagnosis not present

## 2017-10-11 DIAGNOSIS — K5190 Ulcerative colitis, unspecified, without complications: Secondary | ICD-10-CM | POA: Diagnosis not present

## 2017-10-11 DIAGNOSIS — J479 Bronchiectasis, uncomplicated: Secondary | ICD-10-CM | POA: Diagnosis not present

## 2017-10-11 DIAGNOSIS — M722 Plantar fascial fibromatosis: Secondary | ICD-10-CM | POA: Diagnosis not present

## 2017-10-11 DIAGNOSIS — I2510 Atherosclerotic heart disease of native coronary artery without angina pectoris: Secondary | ICD-10-CM | POA: Diagnosis not present

## 2017-10-11 DIAGNOSIS — Z299 Encounter for prophylactic measures, unspecified: Secondary | ICD-10-CM | POA: Diagnosis not present

## 2017-10-12 ENCOUNTER — Ambulatory Visit (INDEPENDENT_AMBULATORY_CARE_PROVIDER_SITE_OTHER): Payer: Medicare Other | Admitting: Urology

## 2017-10-12 DIAGNOSIS — N401 Enlarged prostate with lower urinary tract symptoms: Secondary | ICD-10-CM

## 2017-10-17 ENCOUNTER — Encounter: Payer: Self-pay | Admitting: Physician Assistant

## 2017-10-17 ENCOUNTER — Telehealth: Payer: Self-pay | Admitting: *Deleted

## 2017-10-17 DIAGNOSIS — I2510 Atherosclerotic heart disease of native coronary artery without angina pectoris: Secondary | ICD-10-CM | POA: Diagnosis not present

## 2017-10-17 DIAGNOSIS — Z6830 Body mass index (BMI) 30.0-30.9, adult: Secondary | ICD-10-CM | POA: Diagnosis not present

## 2017-10-17 DIAGNOSIS — M79671 Pain in right foot: Secondary | ICD-10-CM | POA: Diagnosis not present

## 2017-10-17 DIAGNOSIS — M5410 Radiculopathy, site unspecified: Secondary | ICD-10-CM | POA: Diagnosis not present

## 2017-10-17 DIAGNOSIS — I10 Essential (primary) hypertension: Secondary | ICD-10-CM | POA: Diagnosis not present

## 2017-10-17 DIAGNOSIS — E7800 Pure hypercholesterolemia, unspecified: Secondary | ICD-10-CM | POA: Diagnosis not present

## 2017-10-17 DIAGNOSIS — Z299 Encounter for prophylactic measures, unspecified: Secondary | ICD-10-CM | POA: Diagnosis not present

## 2017-10-17 NOTE — Telephone Encounter (Signed)
Patient calling with c/o SOB, not new but is worsening.  Noticing more difficultly with walking up stairs.  BP also elevated 180's/90.  Stated that he also feels disoriented.  Does already have OV with his pmd this evening.  Patient is overdue to see Dr. Domenic Polite, last seen April of last year - was due in October.  OV scheduled for tomorrow in [place] office with Melina Copa.  Encouraged to still keep the OV with pmd in light of him feeling disoriented, may need to look at non cardiac issues as well.  Patient verbalized understanding.

## 2017-10-17 NOTE — Progress Notes (Signed)
Cardiology Office Note    Date:  10/18/2017  ID:  Richard Webb, DOB 04/26/1946, MRN [id number] PCP:  [name], MD  Cardiologist:  Dr. 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   Cath in 2011 resulted in PCI as above, with residual 50-70% stenosis in the distal Cx in AV groove, 70% distal LAD beyond apex, 70% small D1. Last echo in 2014 was technically difficult; mild LVH, EF 50%, possible diastolic dysfunction, AV sclerosis without stenosis. He has history of exertional angina with higher levels of activity. Nuclear stress test 11/2016 was normal, continued medical therapy was recommended. Last labs 08/2017 showed K 3.6, Cr 0.93, glucose 109, Hgb 13.0, Plt 242. Last lipids in 2017 showed Tchol 178, HDL 49, LDL 108. He believes he is on Pravastatin 52m now but had prior myalgias with Crestor and simvastatin.  For the last several months he has noticed a sensation of fatigue and decreased exercise tolerance with dyspnea on exertion. He has history of asthma and prior "lung damage" from sarcoidosis and follows with pulm as well. He's been told he has a decreased lung capacity. If he does normal levels of activity, he feels fine. But if he pushes himself (as was the case in 2018 prompting nuc), he develops exertional dyspnea. He and his wife feel this is a little bit more noticeable recently with less strenuous activity. He has started taking the elevator at church instead of the stairs. Coinciding with this he has also noticed his BP starting to run higher, in the 150 range in the doctors office and some readings up to 190 at home. He  saw his family doctor yesterday and losartan was increased to 510mdaily which he took yesterday. His BP at home was in the 150 range this AM. It was 124/86 by nurse check and 140/95 by my personal recheck. He denies any chest pain like prior angina. He is compliant with CPAP but has not had his information downloaded recently. Wife (nurse) thinks his ankles are swollen but he doesn't think so.   Past Medical History:  Diagnosis Date  . Arthritis   . Asthma   . Coronary atherosclerosis of native coronary artery    a. DES x 3 RCA and DES mCx 10/11 - [place] - ith residual 50-70% stenosis in the distal Cx in AV groove, 70% distal LAD beyond apex, 70% small D1.  . Marland KitchenERD (gastroesophageal reflux disease)   . History of kidney stones   . Mixed hyperlipidemia   . Obstructive sleep apnea   . Pulmonary hypertension (HCC)    PASP 50 mmHg  . Restless leg syndrome   . Sarcoidosis   . Statin intolerance   . [facility]    Past Surgical History:  Procedure Laterality Date  . Ankle fracture repair Left 1985  . CARDIAC CATHETERIZATION     stents x4  . CHOLECYSTECTOMY  1970  . CYSTOSCOPY WITH INSERTION OF UROLIFT N/A 08/27/2017   Procedure: CYSTOSCOPY WITH INSERTION OF UROLIFT;  Surgeon: McCleon GustinMD;  Location: [facility];  Service: Urology;  Laterality: N/A;  . LAPAROSCOPIC APPENDECTOMY  2013  .  Right total knee replacement Bilateral 2005    Current Medications: Current Meds  Medication Sig  . aspirin 81 MG tablet Take 81 mg by mouth daily.  . carbidopa-levodopa (SINEMET IR) 10-100 MG per tablet Take 1-2 tablets by mouth at bedtime.   . Cyanocobalamin (B-12) 1000 MCG/ML KIT Inject 1,000 mcg as directed every 30 (thirty) days.  Marland Kitchen dicyclomine (BENTYL) 10 MG capsule Take 1 capsule by mouth 4 (four) times daily as needed.   Marland Kitchen EPINEPHrine (EPIPEN JR) 0.15 MG/0.3ML injection Inject 0.15 mg into the muscle as needed.  Marland Kitchen HYDROcodone-acetaminophen (NORCO/VICODIN) 5-325 MG tablet  Take 1 tablet by mouth every 8 (eight) hours as needed for pain.   Marland Kitchen losartan (COZAAR) 25 MG tablet Take 25 mg by mouth daily. - now taking 70m  . meloxicam (MOBIC) 7.5 MG tablet Take 7.5 mg by mouth daily.   . montelukast (SINGULAIR) 10 MG tablet Take 10 mg by mouth at bedtime.  . nitroGLYCERIN (NITROSTAT) 0.4 MG SL tablet Place 1 tablet (0.4 mg total) under the tongue every 5 (five) minutes as needed for chest pain.  . NON FORMULARY CPAP UAD  . omeprazole (PRILOSEC) 20 MG capsule Take 20 mg by mouth daily.  .Marland KitchensulfaSALAzine (AZULFIDINE) 500 MG tablet Take 1,000 mg by mouth 2 (two) times daily.  . temazepam (RESTORIL) 30 MG capsule Take 30 mg by mouth at bedtime as needed for sleep.   . Vitamin D, Ergocalciferol, (DRISDOL) 50000 UNITS CAPS capsule Take 50,000 Units by mouth every 7 (seven) days. fridays  + Pravastatin - they think 187m Allergies:   Crestor [rosuvastatin] and Simvastatin   Social History   Socioeconomic History  . Marital status: Married    Spouse name: None  . Number of children: None  . Years of education: None  . Highest education level: None  Social Needs  . Financial resource strain: None  . Food insecurity - worry: None  . Food insecurity - inability: None  . Transportation needs - medical: None  . Transportation needs - non-medical: None  Occupational History  . Occupation: Pastor  Tobacco Use  . Smoking status: Never Smoker  . Smokeless tobacco: Never Used  Substance and Sexual Activity  . Alcohol use: No  . Drug use: No  . Sexual activity: Yes    Birth control/protection: None  Other Topics Concern  . None  Social History Narrative  . None     Family History:  Family History  Problem Relation Age of Onset  . Colon cancer Mother   . CAD Father     ROS:   Please see the history of present illness. No orthopnea, PND. + generalized fatigue. All other systems are reviewed and otherwise negative.    PHYSICAL EXAM:   VS:  BP 124/86    Pulse 74   Ht 5' 11.5" (1.816 m)   Wt 219 lb (99.3 kg)   SpO2 97%   BMI 30.12 kg/m   BMI: Body mass index is 30.12 kg/m. GEN: Well nourished, well developed overweight WM, in no acute distress  HEENT: normocephalic, atraumatic Neck: no JVD, carotid bruits, or masses Cardiac: RRR; no murmurs, rubs, or gallops, no edema  Respiratory:  clear to auscultation bilaterally, normal work of breathing GI: soft, nontender, nondistended, + BS MS: no deformity or atrophy  Skin: warm and dry, no rash Neuro:  Alert and Oriented x 3, Strength and sensation are intact, follows commands Psych: euthymic mood, full affect  Wt Readings from Last 3  Encounters:  10/18/17 219 lb (99.3 kg)  08/23/17 220 lb (99.8 kg)  12/01/16 222 lb (100.7 kg)      Studies/Labs Reviewed:   EKG:  EKG was ordered today and personally reviewed by me and demonstrates NSR 74bpm no acute ST-T changes  Recent Labs: 08/23/2017: BUN 9; Creatinine, Ser 0.93; Hemoglobin 13.0; Platelets 242; Potassium 3.6; Sodium 142   Lipid Panel No results found for: CHOL, TRIG, HDL, CHOLHDL, VLDL, LDLCALC, LDLDIRECT  Additional studies/ records that were reviewed today include: Summarized above.    ASSESSMENT & PLAN:   1. Shortness of breath - etiology not totally clear, may be related to higher BP superimposed on known diffuse CAD and decreased pulmonary reserve. Would prefer to avoid beta blockers for now given his generalized fatigue and h/o asthma. Will try addition of low dose amlodipine. Will arrange 2D echo given cardiac history as well as reported history of pulmonary HTN. Update his labs including BNP, CBC, BMET. ER precautions reviewed. Continue ASA. 2. HTN - add amlodipine and follow. Also discussed following up for his CPAP given his recent fatigue and BP spikes. Check TSH. Continue losartan 21m daily. 3. CAD - as above, update echo. If symptoms persist despite BP control and workup otherwise unrevealing, may need to consider  updating ischemic workup. 4. Hyperlipidemia - he is now on pravastatin 116mwhich I do not suspect is strong enough for his CAD. Last LDL in 2017 was too high. Will have him get fasting lipid profile the day he comes for his echo. If LDL remains poorly controlled, will refer to pharmD clinic for PCSK9 therapy. He would be amenable to this contingent on cost.  Disposition: F/u with Dr. McDowell/[facility] in 2-3 weeks.  Medication Adjustments/Labs and Tests Ordered: Current medicines are reviewed at length with the patient today.  Concerns regarding medicines are outlined above. Medication changes, Labs and Tests ordered today are summarized above and listed in the Patient Instructions accessible in Encounters.   Signed, DaCharlie PitterPA-C  10/18/2017 3:36 PM    [place] [id number]: [phone] [phone]

## 2017-10-18 ENCOUNTER — Encounter: Payer: Self-pay | Admitting: Physician Assistant

## 2017-10-18 ENCOUNTER — Ambulatory Visit (INDEPENDENT_AMBULATORY_CARE_PROVIDER_SITE_OTHER): Payer: Medicare Other | Admitting: Physician Assistant

## 2017-10-18 VITALS — BP 140/95 | HR 74 | Ht 71.5 in | Wt 219.0 lb

## 2017-10-18 DIAGNOSIS — I10 Essential (primary) hypertension: Secondary | ICD-10-CM

## 2017-10-18 DIAGNOSIS — E782 Mixed hyperlipidemia: Secondary | ICD-10-CM | POA: Diagnosis not present

## 2017-10-18 DIAGNOSIS — H524 Presbyopia: Secondary | ICD-10-CM | POA: Diagnosis not present

## 2017-10-18 DIAGNOSIS — H5203 Hypermetropia, bilateral: Secondary | ICD-10-CM | POA: Diagnosis not present

## 2017-10-18 DIAGNOSIS — R0602 Shortness of breath: Secondary | ICD-10-CM | POA: Diagnosis not present

## 2017-10-18 DIAGNOSIS — H2513 Age-related nuclear cataract, bilateral: Secondary | ICD-10-CM | POA: Diagnosis not present

## 2017-10-18 DIAGNOSIS — I2510 Atherosclerotic heart disease of native coronary artery without angina pectoris: Secondary | ICD-10-CM

## 2017-10-18 DIAGNOSIS — H35373 Puckering of macula, bilateral: Secondary | ICD-10-CM | POA: Diagnosis not present

## 2017-10-18 MED ORDER — AMLODIPINE BESYLATE 2.5 MG PO TABS: 2.5000 mg | ORAL_TABLET | Freq: Every day | ORAL | 3 refills | Status: DC

## 2017-10-18 NOTE — Patient Instructions (Signed)
Medication Instructions:  Your physician has recommended you make the following change in your medication: Start Amlodipine 2.5 mg Daily    Labwork: Your physician recommends that you return for lab work Today  Your physician recommends that you return for lab work in: Fasting Day of Echo    Testing/Procedures: Your physician has requested that you have an echocardiogram. Echocardiography is a painless test that uses sound waves to create images of your heart. It provides your doctor with information about the size and shape of your heart and how well your heart's chambers and valves are working. This procedure takes approximately one hour. There are no restrictions for this procedure.    Follow-Up: Your physician recommends that you schedule a follow-up appointment with Dr. Domenic Polite   Any Other Special Instructions Will Be Listed Below (If Applicable).     If you need a refill on your cardiac medications before your next appointment, please call your pharmacy.  Thank you for choosing [facility]!

## 2017-10-19 LAB — CBC WITH DIFFERENTIAL/PLATELET
BASOS PCT: 0.5 %
Basophils Absolute: 42 cells/uL (ref 0–200)
EOS PCT: 4 %
Eosinophils Absolute: 332 cells/uL (ref 15–500)
HCT: 41.5 % (ref 38.5–50.0)
Hemoglobin: 14.1 g/dL (ref 13.2–17.1)
Lymphs Abs: 1710 cells/uL (ref 850–3900)
MCH: 27.9 pg (ref 27.0–33.0)
MCHC: 34 g/dL (ref 32.0–36.0)
MCV: 82.2 fL (ref 80.0–100.0)
MPV: 9.9 fL (ref 7.5–12.5)
Monocytes Relative: 9.1 %
Neutro Abs: 5461 cells/uL (ref 1500–7800)
Neutrophils Relative %: 65.8 %
Platelets: 282 10*3/uL (ref 140–400)
RBC: 5.05 10*6/uL (ref 4.20–5.80)
RDW: 14.9 % (ref 11.0–15.0)
Total Lymphocyte: 20.6 %
WBC: 8.3 10*3/uL (ref 3.8–10.8)
WBCMIX: 755 {cells}/uL (ref 200–950)

## 2017-10-19 LAB — BASIC METABOLIC PANEL
BUN: 13 mg/dL (ref 7–25)
CO2: 28 mmol/L (ref 20–32)
Calcium: 9.9 mg/dL (ref 8.6–10.3)
Chloride: 104 mmol/L (ref 98–110)
Creat: 0.86 mg/dL (ref 0.70–1.18)
Glucose, Bld: 98 mg/dL (ref 65–139)
POTASSIUM: 4.4 mmol/L (ref 3.5–5.3)
Sodium: 140 mmol/L (ref 135–146)

## 2017-10-19 LAB — TSH: TSH: 1.38 m[IU]/L (ref 0.40–4.50)

## 2017-10-19 LAB — BRAIN NATRIURETIC PEPTIDE: BRAIN NATRIURETIC PEPTIDE: 10 pg/mL (ref <100)

## 2017-10-23 ENCOUNTER — Telehealth: Payer: Self-pay

## 2017-10-23 ENCOUNTER — Other Ambulatory Visit (HOSPITAL_COMMUNITY)
Admission: RE | Admit: 2017-10-23 | Discharge: 2017-10-23 | Disposition: A | Discharge status: Home / Self Care | Payer: Medicare Other | Source: Ambulatory Visit | Attending: Physician Assistant | Admitting: Physician Assistant

## 2017-10-23 ENCOUNTER — Ambulatory Visit (HOSPITAL_COMMUNITY)
Admission: RE | Admit: 2017-10-23 | Discharge: 2017-10-23 | Disposition: A | Discharge status: Home / Self Care | Payer: Medicare Other | Source: Ambulatory Visit | Attending: Physician Assistant | Admitting: Physician Assistant

## 2017-10-23 DIAGNOSIS — E785 Hyperlipidemia, unspecified: Secondary | ICD-10-CM | POA: Insufficient documentation

## 2017-10-23 DIAGNOSIS — I2510 Atherosclerotic heart disease of native coronary artery without angina pectoris: Secondary | ICD-10-CM | POA: Insufficient documentation

## 2017-10-23 DIAGNOSIS — I272 Pulmonary hypertension, unspecified: Secondary | ICD-10-CM | POA: Insufficient documentation

## 2017-10-23 DIAGNOSIS — E782 Mixed hyperlipidemia: Secondary | ICD-10-CM | POA: Diagnosis not present

## 2017-10-23 DIAGNOSIS — R0602 Shortness of breath: Secondary | ICD-10-CM | POA: Diagnosis not present

## 2017-10-23 DIAGNOSIS — K219 Gastro-esophageal reflux disease without esophagitis: Secondary | ICD-10-CM | POA: Diagnosis not present

## 2017-10-23 LAB — LIPID PANEL
CHOL/HDL RATIO: 3.6 ratio
Cholesterol: 167 mg/dL (ref 0–200)
HDL: 46 mg/dL (ref >40)
LDL Cholesterol: 104 mg/dL — ABNORMAL HIGH (ref 0–99)
Triglycerides: 87 mg/dL (ref <150)
VLDL: 17 mg/dL (ref 0–40)

## 2017-10-23 NOTE — Telephone Encounter (Signed)
Pt has been scheduled in lipid clinic tomorrow 3/6.

## 2017-10-23 NOTE — Progress Notes (Signed)
*  PRELIMINARY RESULTS* Echocardiogram 2D Echocardiogram has been performed.  Leavy Cella 10/23/2017, 10:05 AM

## 2017-10-23 NOTE — Telephone Encounter (Signed)
-----   Message from Charlie Pitter, PA-C sent at 10/23/2017  9:47 AM EST ----- LDL is too high given his history of CAD. Should be <70. May qualify for PCSK 9 inhibitors given his prior history of statin intolerance - he is tolerating pravastatin but it is not strong enough to lower his LDL to goal. We had talked about referring him to pharmD lipid clinic at last [place] and he and his wife were very agreeable - please refer to pharmacy clinic in [place] to discuss. The pharmD may try to titrate pravastatin first, but will be able to outline a plan for whether or not the PCSK9's need to be considered. Await echo. Dayna Dunn PA-C

## 2017-10-23 NOTE — Telephone Encounter (Signed)
I sent staff message to Megan supple at Lipid clinic with [facility] message. I left message for patient to call back

## 2017-10-24 ENCOUNTER — Other Ambulatory Visit: Payer: Self-pay

## 2017-10-24 ENCOUNTER — Ambulatory Visit (INDEPENDENT_AMBULATORY_CARE_PROVIDER_SITE_OTHER): Payer: Medicare Other | Admitting: Pharmacist

## 2017-10-24 DIAGNOSIS — I2510 Atherosclerotic heart disease of native coronary artery without angina pectoris: Secondary | ICD-10-CM

## 2017-10-24 DIAGNOSIS — E782 Mixed hyperlipidemia: Secondary | ICD-10-CM

## 2017-10-24 MED ORDER — EZETIMIBE 10 MG PO TABS: 10.0000 mg | ORAL_TABLET | Freq: Every day | ORAL | 3 refills | Status: DC

## 2017-10-24 NOTE — Patient Instructions (Signed)
START ezetimibe (Zetia) 40m daily  Call [phone] you have any issues  We will check your cholesterol panel in 2-3 months    Cholesterol Cholesterol is a fat. Your body needs a small amount of cholesterol. Cholesterol (plaque) may build up in your blood vessels (arteries). That makes you more likely to have a heart attack or stroke. You cannot feel your cholesterol level. Having a blood test is the only way to find out if your level is high. Keep your test results. Work with your doctor to keep your cholesterol at a good level. What do the results mean?  Total cholesterol is how much cholesterol is in your blood.  LDL is bad cholesterol. This is the type that can build up. Try to have low LDL.  HDL is good cholesterol. It cleans your blood vessels and carries LDL away. Try to have high HDL.  Triglycerides are fat that the body can store or burn for energy. What are good levels of cholesterol?  Total cholesterol below 200.  LDL below 100 is good for people who have health risks. LDL below 70 is good for people who have very high risks.  HDL above 40 is good. It is best to have HDL of 60 or higher.  Triglycerides below 150. How can I lower my cholesterol? Diet Follow your diet program as told by your doctor.  Choose fish, white meat chicken, or t[place]that is roasted or baked. Try not to eat red meat, fried foods, sausage, or lunch meats.  Eat lots of fresh fruits and vegetables.  Choose whole grains, beans, pasta, potatoes, and cereals.  Choose olive oil, corn oil, or canola oil. Only use small amounts.  Try not to eat butter, mayonnaise, shortening, or palm kernel oils.  Try not to eat foods with trans fats.  Choose low-fat or nonfat dairy foods. ? Drink skim or nonfat milk. ? Eat low-fat or nonfat yogurt and cheeses. ? Try not to drink whole milk or cream. ? Try not to eat ice cream, egg yolks, or full-fat cheeses.  Healthy desserts include angel food cake,  ginger snaps, animal crackers, hard candy, popsicles, and low-fat or nonfat frozen yogurt. Try not to eat pastries, cakes, pies, and cookies.  Exercise Follow your exercise program as told by your doctor.  Be more active. Try gardening, walking, and taking the stairs.  Ask your doctor about ways that you can be more active.  Medicine  Take over-the-counter and prescription medicines only as told by your doctor. This information is not intended to replace advice given to you by your health care provider. Make sure you discuss any questions you have with your health care provider. Document Released: 11/03/2008 Document Revised: 03/08/2016 Document Reviewed: 02/17/2016 Elsevier Interactive Patient Education  2[employer]

## 2017-10-24 NOTE — Progress Notes (Signed)
Patient ID: Richard Webb                 DOB: [birth year]                    MRN: [id number]     HPI: Richard Webb is a 72 y.o. male patient of Dr. Domenic Webb, referred by Richard Copa, PA that presents today for lipid evaluation.  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   He presents today for cholesterol discussion. He occasionally does skip doses of his pravastatin due to muscle aching. He states that he takes it for a few days then will miss a day or two then go back to taking it. He states that this has kept the muscle aching at [place] and has done better for his cholesterol than before.   Risk Factors: CAD LDL Goal: <70  Current Medications: pravastatin 62m daily  Intolerances: rosuvastatin (myalgia), simvastatin 450mdaily (myalgia), niaspan (burning)  Diet: Mostly eats from home. Eats mostly baked and does eats a lot of vegetables. Does have a sweet tooth. Drinks mostly crystal light tea and water. Does have 3-4 cups of coffee with creamer and sweetnlow.   Exercise: No formal exercise. Off and on recently. He is active though around the house.   Family History: father with CAD may have had XOL, mother with colon cancer  Social History: denies tobacco products and alcohol  Labs: 10/23/17: TC 167, TG 87, HDL 46, LDL 104 (pravastatin 2010most days)  Past Medical History:  Diagnosis Date  . Arthritis   . Asthma   . Coronary atherosclerosis of native coronary artery    a. DES x 3 RCA and DES mCx 10/11 - [place] - ith residual 50-70% stenosis in the distal Cx in AV groove, 70% distal LAD beyond apex, 70% small D1.  . GMarland KitchenRD (gastroesophageal reflux disease)   . History of kidney stones   . Mixed hyperlipidemia   . Obstructive sleep apnea   . Pulmonary hypertension (HCC)    PASP 50  mmHg  . Restless leg syndrome   . Sarcoidosis   . Statin intolerance   . Ulcerative colitis ([facility]   Current Outpatient Medications on File Prior to Visit  Medication Sig Dispense Refill  . amLODipine (NORVASC) 2.5 MG tablet Take 1 tablet (2.5 mg total) by mouth daily. 90 tablet 3  . aspirin 81 MG tablet Take 81 mg by mouth daily.    . carbidopa-levodopa (SINEMET IR) 10-100 MG per tablet Take 1-2 tablets by mouth at bedtime.     . Cyanocobalamin (B-12) 1000 MCG/ML KIT Inject 1,000 mcg as directed every 30 (thirty) days.    . dMarland Kitchencyclomine (BENTYL) 10 MG capsule Take 1 capsule by mouth 4 (four) times daily as needed.     . EMarland KitchenINEPHrine (EPIPEN JR) 0.15 MG/0.3ML injection Inject 0.15 mg into the muscle as needed.    . HMarland KitchenDROcodone-acetaminophen (NORCO/VICODIN) 5-325 MG tablet Take 1 tablet by mouth every 8 (eight) hours as needed for pain.     . lMarland Kitchensartan (COZAAR) 50 MG tablet Take 50 mg by mouth daily.    . meloxicam (MOBIC) 7.5 MG tablet Take 7.5 mg by mouth daily.     . montelukast (SINGULAIR) 10 MG tablet Take 10 mg by mouth at bedtime.    .Marland Kitchen  nitroGLYCERIN (NITROSTAT) 0.4 MG SL tablet Place 1 tablet (0.4 mg total) under the tongue every 5 (five) minutes as needed for chest pain. 90 tablet 3  . NON FORMULARY CPAP UAD    . omeprazole (PRILOSEC) 20 MG capsule Take 20 mg by mouth daily.    . pravastatin (PRAVACHOL) 20 MG tablet every day    . sulfaSALAzine (AZULFIDINE) 500 MG tablet Take 1,000 mg by mouth 2 (two) times daily.    . temazepam (RESTORIL) 30 MG capsule Take 30 mg by mouth at bedtime as needed for sleep.     . Vitamin D, Ergocalciferol, (DRISDOL) 50000 UNITS CAPS capsule Take 50,000 Units by mouth every 7 (seven) days. fridays     No current facility-administered medications on file prior to visit.     Allergies  Allergen Reactions  . Statins     Myalgias  . Crestor [Rosuvastatin] Other (See Comments)    myalgia  . Simvastatin Other (See Comments)    myalgia     Assessment/Plan: Hyperlipidemia: LDL not at goal <70. Discussed increasing pravastatin, but will defer this at this time due to muscle aches. He will continue to take pravastatin 94m as much as tolerated. Discussed zetia vs. PCSK9i therapy. He would like to try Zetia 180mdaily first. If not able to tolerate or LDL not at goal will pursue PCSK9i therapy. Will repeat labs in 2-3 months. [place] results fax[place] to our office. Lab order mailed to patient.   Thank you,  KeLelan PonsAuPatterson HammersmithPhBisbeeroup Webb  10/24/2017 7:22 AM

## 2017-11-05 DIAGNOSIS — H2513 Age-related nuclear cataract, bilateral: Secondary | ICD-10-CM | POA: Diagnosis not present

## 2017-11-05 DIAGNOSIS — I10 Essential (primary) hypertension: Secondary | ICD-10-CM | POA: Diagnosis not present

## 2017-11-05 DIAGNOSIS — H2511 Age-related nuclear cataract, right eye: Secondary | ICD-10-CM | POA: Diagnosis not present

## 2017-11-05 DIAGNOSIS — I2510 Atherosclerotic heart disease of native coronary artery without angina pectoris: Secondary | ICD-10-CM | POA: Diagnosis not present

## 2017-11-14 NOTE — Progress Notes (Signed)
Cardiology Office Note    Date:  11/15/2017   ID:  Richard Webb, DOB 03/22/1946, MRN [id number]  PCP:  [name], MD  Cardiologist: Rozann Lesches, MD    Chief Complaint  Patient presents with  . Follow-up    1 month visit    History of Present Illness:    Richard Webb is a 72 y.o. male with past medical history of CAD (s/p DESx3 to RCA and DES to mid-LCx in 05/2010, low-risk NST in 11/2016), HTN, HLD, OSA, Pulmonary HTN, Pulmonary sarcoidosis, and asthma who presents to the office today for one-month follow-up.  He was recently examined by Richard Ku, PA-C on 10/18/2017 and reported worsening fatigue and decreased exercise intolerance over the past several months. At the time of his visit, he noted Losartan was recently increased to 50 mg daily by his PCP due to elevated blood pressure. Labs were checked at that time and showed BNP of 10, TSH 1.38, and Hgb at 14.1. Repeat FLP was obtained and showed total cholesterol of 167, HDL 46, and LDL 104. Was referred to the [facility] due to his statin intolerances and it was recommended he try Zetia 52m daily initially and if not at goal within 2-3 months, plan for initiation of PCSK9 inhibitor therapy. A repeat echocardiogram was obtained on 10/23/2017 and showed a preserved EF of 544-01% grade 1 diastolic dysfunction, and no regional wall motion abnormalities. No significant valve abnormalities were identified.  In talking with the patient today, he reports overall significant improvement in his fatigue and dyspnea since his last office visit. He has been going to the gym multiple times per week and working out for an hour at a time without any anginal symptoms. No recent chest pain. Denies any orthopnea, PND, lower extremity edema, or palpitations.  He has been following his blood pressure closely at home and reports it has been in the 130's to 140's/80's.  Initially checked and at 128/84 during today's visit, rechecked personally and  at 132/84.   Past Medical History:  Diagnosis Date  . Arthritis   . Asthma   . Coronary atherosclerosis of native coronary artery    a. DES x 3 RCA and DES mCx 10/11 - [place] - with residual 50-70% stenosis in the distal Cx in AV groove, 70% distal LAD beyond apex, 70% small D1.  .Marland KitchenGERD (gastroesophageal reflux disease)   . History of kidney stones   . Mixed hyperlipidemia   . Obstructive sleep apnea   . Pulmonary hypertension (HCC)    PASP 50 mmHg  . Restless leg syndrome   . Sarcoidosis   . Statin intolerance   . Ulcerative colitis ([facility]     Past Surgical History:  Procedure Laterality Date  . Ankle fracture repair Left 1985  . CARDIAC CATHETERIZATION     stents x4  . CHOLECYSTECTOMY  1970  . CYSTOSCOPY WITH INSERTION OF UROLIFT N/A 08/27/2017   Procedure: CYSTOSCOPY WITH INSERTION OF UROLIFT;  Surgeon: Richard Gustin MD;  Location: [facility];  Service: Urology;  Laterality: N/A;  . LAPAROSCOPIC APPENDECTOMY  2013  . Right total knee replacement Bilateral 2005    Current Medications: Outpatient Medications Prior to Visit  Medication Sig Dispense Refill  . amLODipine (NORVASC) 2.5 MG tablet Take 1 tablet (2.5 mg total) by mouth daily. 90 tablet 3  . aspirin 81 MG tablet Take 81 mg by mouth daily.    . carbidopa-levodopa (SINEMET IR) 10-100 MG per tablet Take 1-2 tablets by  mouth at bedtime.     . Cyanocobalamin (B-12) 1000 MCG/ML KIT Inject 1,000 mcg as directed every 30 (thirty) days.    Marland Kitchen dicyclomine (BENTYL) 10 MG capsule Take 1 capsule by mouth 4 (four) times daily as needed.     Marland Kitchen EPINEPHrine (EPIPEN JR) 0.15 MG/0.3ML injection Inject 0.15 mg into the muscle as needed.    . ezetimibe (ZETIA) 10 MG tablet Take 1 tablet (10 mg total) by mouth daily. 90 tablet 3  . HYDROcodone-acetaminophen (NORCO/VICODIN) 5-325 MG tablet Take 1 tablet by mouth every 8 (eight) hours as needed for pain.     Marland Kitchen losartan (COZAAR) 50 MG tablet Take 50 mg by mouth daily.    . meloxicam  (MOBIC) 7.5 MG tablet Take 7.5 mg by mouth daily.     . montelukast (SINGULAIR) 10 MG tablet Take 10 mg by mouth at bedtime.    . nitroGLYCERIN (NITROSTAT) 0.4 MG SL tablet Place 1 tablet (0.4 mg total) under the tongue every 5 (five) minutes as needed for chest pain. 90 tablet 3  . NON FORMULARY CPAP UAD    . pravastatin (PRAVACHOL) 20 MG tablet every day    . sulfaSALAzine (AZULFIDINE) 500 MG tablet Take 1,000 mg by mouth 2 (two) times daily.    . temazepam (RESTORIL) 30 MG capsule Take 30 mg by mouth at bedtime as needed for sleep.     . Vitamin D, Ergocalciferol, (DRISDOL) 50000 UNITS CAPS capsule Take 50,000 Units by mouth every 7 (seven) days. fridays    . omeprazole (PRILOSEC) 20 MG capsule Take 20 mg by mouth daily.     No facility-administered medications prior to visit.      Allergies:   Statins; Crestor [rosuvastatin]; and Simvastatin   Social History   Socioeconomic History  . Marital status: Married    Spouse name: Not on file  . Number of children: Not on file  . Years of education: Not on file  . Highest education level: Not on file  Occupational History  . Occupation: Theme park manager  Social Needs  . Financial resource strain: Not on file  . Food insecurity:    Worry: Not on file    Inability: Not on file  . Transportation needs:    Medical: Not on file    Non-medical: Not on file  Tobacco Use  . Smoking status: Never Smoker  . Smokeless tobacco: Never Used  Substance and Sexual Activity  . Alcohol use: No  . Drug use: No  . Sexual activity: Yes    Birth control/protection: None  Lifestyle  . Physical activity:    Days per week: Not on file    Minutes per session: Not on file  . Stress: Not on file  Relationships  . Social connections:    Talks on phone: Not on file    Gets together: Not on file    Attends religious service: Not on file    Active member of club or organization: Not on file    Attends meetings of clubs or organizations: Not on file     Relationship status: Not on file  Other Topics Concern  . Not on file  Social History Narrative  . Not on file     Family History:  The patient's family history includes CAD in his father; Colon cancer in his mother.   Review of Systems:   Please see the history of present illness.     General:  No chills, fever, night sweats or weight changes.  Cardiovascular:  No chest pain, edema, orthopnea, palpitations, paroxysmal nocturnal dyspnea. Positive for dyspnea on exertion (improved).  Dermatological: No rash, lesions/masses Respiratory: No cough, dyspnea Urologic: No hematuria, dysuria Abdominal:   No nausea, vomiting, diarrhea, bright red blood per rectum, melena, or hematemesis Neurologic:  No visual changes, wkns, changes in mental status. All other systems reviewed and are otherwise negative except as noted above.   Physical Exam:    VS:  BP 128/84   Pulse 83   Ht 5' 11.5" (1.816 m)   Wt 217 lb (98.4 kg)   SpO2 98%   BMI 29.84 kg/m    General: Well developed, well nourished Caucasian male appearing in no acute distress. Head: Normocephalic, atraumatic, sclera non-icteric, no xanthomas, nares are without discharge.  Neck: No carotid bruits. JVD not elevated.  Lungs: Respirations regular and unlabored, without wheezes or rales.  Heart: Regular rate and rhythm. No S3 or S4.  No murmur, no rubs, or gallops appreciated. Abdomen: Soft, non-tender, non-distended with normoactive bowel sounds. No hepatomegaly. No rebound/guarding. No obvious abdominal masses. Msk:  Strength and tone appear normal for age. No joint deformities or effusions. Extremities: No clubbing or cyanosis. No lower extremity edema.  Distal pedal pulses are 2+ bilaterally. Neuro: Alert and oriented X 3. Moves all extremities spontaneously. No focal deficits noted. Psych:  Responds to questions appropriately with a normal affect. Skin: No rashes or lesions noted  Wt Readings from Last 3 Encounters:  11/15/17  217 lb (98.4 kg)  10/18/17 219 lb (99.3 kg)  08/23/17 220 lb (99.8 kg)     Studies/Labs Reviewed:   EKG:  EKG is not ordered today.   Recent Labs: 10/18/2017: Brain Natriuretic Peptide 10; BUN 13; Creat 0.86; Hemoglobin 14.1; Platelets 282; Potassium 4.4; Sodium 140; TSH 1.38   Lipid Panel    Component Value Date/Time   CHOL 167 10/23/2017 0857   TRIG 87 10/23/2017 0857   HDL 46 10/23/2017 0857   CHOLHDL 3.6 10/23/2017 0857   VLDL 17 10/23/2017 0857   LDLCALC 104 (H) 10/23/2017 0857    Additional studies/ records that were reviewed today include:   Echocardiogram: 10/23/2017 Study Conclusions  - Left ventricle: The cavity size was normal. Wall thickness was   increased increased in a pattern of mild to moderate LVH.   Systolic function was normal. The estimated ejection fraction was   in the range of 55% to 60%. Wall motion was normal; there were no   regional wall motion abnormalities. Doppler parameters are   consistent with abnormal left ventricular relaxation (grade 1   diastolic dysfunction). - Aortic valve: Mildly calcified annulus. Trileaflet; mildly   thickened leaflets. Valve area (VTI): 2.54 cm^2. Valve area   (Vmax): 2.26 cm^2. - Mitral valve: Mildly calcified annulus. Normal thickness leaflets   . - Technically adequate study.  NST: 11/2016  Blood pressure demonstrated a hypertensive response to exercise.  The study is normal. No myocardial ischemia or scar.  This is a low risk study.  Nuclear stress EF: 59%.  Assessment:    1. Coronary artery disease involving native coronary artery of native heart with other form of angina pectoris ([place])   2. Essential hypertension   3. Mixed hyperlipidemia   4. Statin intolerance   5. OSA (obstructive sleep apnea)      Plan:   In order of problems listed above:  1. CAD - s/p DESx3 to RCA and DES to mid-LCx in 05/2010 with low-risk NST in 11/2016. Was recently evaluated  for worsening fatigue and decreased  exercise intolerance in the setting of elevated BP. Labs showed BNP of 10, TSH 1.38, and Hgb at 14.1. Repeat echo showed a preserved EF of 55-60%, mild to moderate LVH, grade 1 diastolic dysfunction, and no regional wall motion abnormalities.  - We reviewed his echocardiogram report in detail today. Since being started on Amlodipine and having improvement in his blood pressure readings, his respiratory status has significantly improved.  He has been exercising multiple times per week without any recurrent symptoms.  - Will continue with current medical therapy at this time including ASA, Amlodipine, and statin therapy. Not on BB given history of asthma. If he develops recurrent symptoms, would have a low-threshold to pursue a repeat stress test.   2. HTN - BP initially at 128/84 during today's visit, rechecked and at 132/84. - would continue Amlodipine 2.40m daily and Losartan 527mdaily. I encouraged him to follow BP at home and if consistently > 140/90, should call with recent numbers as we can further titrate Amlodipine to 7m34maily.   3. HLD/Statin Intolerance -Recent FLP on 10/23/2017 showed total cholesterol 167, triglycerides 87, HDL 46, and LDL 104.  Not at goal of LDL less than 70 and he was referred to the Lipid Clinic as he had been intolerant to higher-intensity statins in the past. He has been continued on Pravastatin 20 mg daily and started on Zetia 10 mg daily.  Will plan for a repeat FLP and LFT's in 2 months and consideration of PCSK9 inhibitor if not at goal.   4. OSA - Continued compliance with CPAP encouraged.    Medication Adjustments/Labs and Tests Ordered: Current medicines are reviewed at length with the patient today.  Concerns regarding medicines are outlined above.  Medication changes, Labs and Tests ordered today are listed in the Patient Instructions below. Patient Instructions  Medication Instructions:  Your physician recommends that you continue on your current  medications as directed. Please refer to the Current Medication list given to you today.  Labwork: NONE   Testing/Procedures: NONE   Follow-Up: Your physician recommends that you schedule a follow-up appointment in: May or June with Dr. McDDomenic Polite Any Other Special Instructions Will Be Listed Below (If Applicable).  Your physician has requested that you regularly monitor and record your blood pressure readings at home. Please use the same machine at the same time of day to check your readings and record them to bring to your follow-up visit.  Call if Blood Pressure is consistently above 140/90   If you need a refill on your cardiac medications before your next appointment, please call your pharmacy. Thank you for choosing [facility] .  Weston BrassriErma HeritageA-C  11/15/2017 2:06 PM    [id number] [id number]: [phone]

## 2017-11-15 ENCOUNTER — Ambulatory Visit (INDEPENDENT_AMBULATORY_CARE_PROVIDER_SITE_OTHER): Payer: Medicare Other | Admitting: Student

## 2017-11-15 ENCOUNTER — Encounter: Payer: Self-pay | Admitting: Student

## 2017-11-15 VITALS — BP 128/84 | HR 83 | Ht 71.5 in | Wt 217.0 lb

## 2017-11-15 DIAGNOSIS — I2510 Atherosclerotic heart disease of native coronary artery without angina pectoris: Secondary | ICD-10-CM

## 2017-11-15 DIAGNOSIS — Z789 Other specified health status: Secondary | ICD-10-CM | POA: Diagnosis not present

## 2017-11-15 DIAGNOSIS — I10 Essential (primary) hypertension: Secondary | ICD-10-CM | POA: Diagnosis not present

## 2017-11-15 DIAGNOSIS — I25118 Atherosclerotic heart disease of native coronary artery with other forms of angina pectoris: Secondary | ICD-10-CM

## 2017-11-15 DIAGNOSIS — E782 Mixed hyperlipidemia: Secondary | ICD-10-CM | POA: Diagnosis not present

## 2017-11-15 DIAGNOSIS — G4733 Obstructive sleep apnea (adult) (pediatric): Secondary | ICD-10-CM

## 2017-11-15 NOTE — Patient Instructions (Signed)
Medication Instructions:  Your physician recommends that you continue on your current medications as directed. Please refer to the Current Medication list given to you today.   Labwork: NONE   Testing/Procedures: NONE   Follow-Up: Your physician recommends that you schedule a follow-up appointment in: May or June with Dr. Domenic Polite.    Any Other Special Instructions Will Be Listed Below (If Applicable).  Your physician has requested that you regularly monitor and record your blood pressure readings at home. Please use the same machine at the same time of day to check your readings and record them to bring to your follow-up visit.  Call if Blood Pressure is above 140/90    If you need a refill on your cardiac medications before your next appointment, please call your pharmacy. Thank you for choosing [facility]!   Marland Kitchen

## 2017-11-19 DIAGNOSIS — H2511 Age-related nuclear cataract, right eye: Secondary | ICD-10-CM | POA: Diagnosis not present

## 2017-11-19 DIAGNOSIS — H25811 Combined forms of age-related cataract, right eye: Secondary | ICD-10-CM | POA: Diagnosis not present

## 2017-11-23 DIAGNOSIS — L409 Psoriasis, unspecified: Secondary | ICD-10-CM | POA: Diagnosis not present

## 2017-11-23 DIAGNOSIS — M542 Cervicalgia: Secondary | ICD-10-CM | POA: Diagnosis not present

## 2017-11-23 DIAGNOSIS — Z299 Encounter for prophylactic measures, unspecified: Secondary | ICD-10-CM | POA: Diagnosis not present

## 2017-11-23 DIAGNOSIS — M1990 Unspecified osteoarthritis, unspecified site: Secondary | ICD-10-CM | POA: Diagnosis not present

## 2017-11-23 DIAGNOSIS — K5190 Ulcerative colitis, unspecified, without complications: Secondary | ICD-10-CM | POA: Diagnosis not present

## 2017-11-23 DIAGNOSIS — I10 Essential (primary) hypertension: Secondary | ICD-10-CM | POA: Diagnosis not present

## 2017-11-23 DIAGNOSIS — Z6829 Body mass index (BMI) 29.0-29.9, adult: Secondary | ICD-10-CM | POA: Diagnosis not present

## 2017-11-26 DIAGNOSIS — L281 Prurigo nodularis: Secondary | ICD-10-CM | POA: Diagnosis not present

## 2017-11-26 DIAGNOSIS — H2512 Age-related nuclear cataract, left eye: Secondary | ICD-10-CM | POA: Diagnosis not present

## 2017-11-26 DIAGNOSIS — L400 Psoriasis vulgaris: Secondary | ICD-10-CM | POA: Diagnosis not present

## 2017-11-26 DIAGNOSIS — L089 Local infection of the skin and subcutaneous tissue, unspecified: Secondary | ICD-10-CM | POA: Diagnosis not present

## 2017-12-10 DIAGNOSIS — H2512 Age-related nuclear cataract, left eye: Secondary | ICD-10-CM | POA: Diagnosis not present

## 2017-12-10 DIAGNOSIS — H25812 Combined forms of age-related cataract, left eye: Secondary | ICD-10-CM | POA: Diagnosis not present

## 2017-12-18 DIAGNOSIS — H35373 Puckering of macula, bilateral: Secondary | ICD-10-CM | POA: Diagnosis not present

## 2017-12-18 DIAGNOSIS — H35411 Lattice degeneration of retina, right eye: Secondary | ICD-10-CM | POA: Diagnosis not present

## 2017-12-18 DIAGNOSIS — H43813 Vitreous degeneration, bilateral: Secondary | ICD-10-CM | POA: Diagnosis not present

## 2017-12-18 DIAGNOSIS — H35422 Microcystoid degeneration of retina, left eye: Secondary | ICD-10-CM | POA: Diagnosis not present

## 2017-12-20 DIAGNOSIS — E782 Mixed hyperlipidemia: Secondary | ICD-10-CM | POA: Diagnosis not present

## 2017-12-24 ENCOUNTER — Ambulatory Visit: Payer: Medicare Other

## 2017-12-24 DIAGNOSIS — M5416 Radiculopathy, lumbar region: Secondary | ICD-10-CM | POA: Diagnosis not present

## 2017-12-25 NOTE — H&P (View-Only) (Signed)
Cardiology Office Note  Date: 12/27/2017   ID: Richard Webb, DOB June 12, 1946, MRN [id number]  PCP: [name], MD  Primary Cardiologist: Rozann Lesches, MD   Chief Complaint  Patient presents with  . Coronary Artery Disease  . Dyspnea on exertion    History of Present Illness: Richard Webb is a 72 y.o. male last seen by Ms. Strader PA-C in March.  He presents with his wife today for a follow-up visit.  I reviewed his interval medication adjustments and testing.  He states that he has had progressive dyspnea on exertion over a period of months, also exertional chest tightness.  His wife mentions that she has noticed him having limitations with exertion as well.  He reports compliance with his medications.  He tries to stay active, still works as a Theme park manager.  Follow-up echocardiogram in March revealed LVEF 55 to 60% with grade 1 diastolic dysfunction.  Myoview from last April was overall low risk without significant ischemia.  Cardiac history is reviewed below.  He underwent placement of DES x3 to the RCA and DES to the circumflex in 2011 in [place].  He did have residual left system disease at that time that was managed medically.  Today we discussed proceeding to a diagnostic cardiac catheterization in light of his progressive symptoms on medical therapy.  After reviewing the risks and benefits, he is in agreement to proceed.  Past Medical History:  Diagnosis Date  . Arthritis   . Asthma   . Coronary atherosclerosis of native coronary artery    a. DES x 3 RCA and DES mCx 10/11 - [place] - with residual 50-70% stenosis in the distal Cx in AV groove, 70% distal LAD beyond apex, 70% small D1.  Marland Kitchen GERD (gastroesophageal reflux disease)   . History of kidney stones   . Mixed hyperlipidemia   . Obstructive sleep apnea   . Pulmonary hypertension (HCC)    PASP 50 mmHg  . Restless leg syndrome   . Sarcoidosis   . Statin intolerance   . Ulcerative colitis [facility])     Past  Surgical History:  Procedure Laterality Date  . Ankle fracture repair Left 1985  . CARDIAC CATHETERIZATION     stents x4  . CHOLECYSTECTOMY  1970  . CYSTOSCOPY WITH INSERTION OF UROLIFT N/A 08/27/2017   Procedure: CYSTOSCOPY WITH INSERTION OF UROLIFT;  Surgeon: Cleon Gustin, MD;  Location: [facility];  Service: Urology;  Laterality: N/A;  . LAPAROSCOPIC APPENDECTOMY  2013  . Right total knee replacement Bilateral 2005    Current Outpatient Medications  Medication Sig Dispense Refill  . amLODipine (NORVASC) 2.5 MG tablet Take 1 tablet (2.5 mg total) by mouth daily. 90 tablet 3  . aspirin 81 MG tablet Take 81 mg by mouth daily.    Marland Kitchen BREO ELLIPTA 200-25 MCG/INH AEPB Inhale 1 Inhaler into the lungs daily.  3  . carbidopa-levodopa (SINEMET IR) 10-100 MG per tablet Take 1-2 tablets by mouth at bedtime.     . cetirizine (ZYRTEC) 10 MG tablet Take 10 mg by mouth daily.    . Cyanocobalamin (B-12) 1000 MCG/ML KIT Inject 1,000 mcg as directed every 30 (thirty) days.    Marland Kitchen dicyclomine (BENTYL) 10 MG capsule Take 1 capsule by mouth 4 (four) times daily as needed.     Marland Kitchen EPINEPHrine (EPIPEN JR) 0.15 MG/0.3ML injection Inject 0.15 mg into the muscle as needed.    . ezetimibe (ZETIA) 10 MG tablet Take 1 tablet (10 mg total) by mouth  daily. 90 tablet 3  . gabapentin (NEURONTIN) 300 MG capsule Take 1 capsule by mouth 3 (three) times daily.    Marland Kitchen HYDROcodone-acetaminophen (NORCO/VICODIN) 5-325 MG tablet Take 1 tablet by mouth every 8 (eight) hours as needed for pain.     Marland Kitchen losartan (COZAAR) 50 MG tablet Take 50 mg by mouth daily.    . meloxicam (MOBIC) 7.5 MG tablet Take 7.5 mg by mouth daily.     . montelukast (SINGULAIR) 10 MG tablet Take 10 mg by mouth at bedtime.    . nitroGLYCERIN (NITROSTAT) 0.4 MG SL tablet Place 1 tablet (0.4 mg total) under the tongue every 5 (five) minutes as needed for chest pain. 90 tablet 3  . NON FORMULARY CPAP UAD    . pravastatin (PRAVACHOL) 20 MG tablet Take 20 mg by mouth  daily.    Marland Kitchen sulfaSALAzine (AZULFIDINE) 500 MG tablet Take 1,000 mg by mouth 2 (two) times daily.    . temazepam (RESTORIL) 30 MG capsule Take 30 mg by mouth at bedtime as needed for sleep.     . Vitamin D, Ergocalciferol, (DRISDOL) 50000 UNITS CAPS capsule Take 50,000 Units by mouth every 7 (seven) days. fridays     No current facility-administered medications for this visit.    Allergies:  Statins; Crestor [rosuvastatin]; and Simvastatin   Social History: The patient  reports that he has never smoked. He has never used smokeless tobacco. He reports that he does not drink alcohol or use drugs.   Family History: The patient's family history includes CAD in his father; Colon cancer in his mother.   ROS:  Please see the history of present illness. Otherwise, complete review of systems is positive for dyspnea on exertion and fatigue.  All other systems are reviewed and negative.   Physical Exam: VS:  BP 138/77   Pulse 67   Ht 5' 11"  (1.803 m)   Wt 222 lb (100.7 kg)   BMI 30.96 kg/m , BMI Body mass index is 30.96 kg/m.  Wt Readings from Last 3 Encounters:  12/27/17 222 lb (100.7 kg)  11/15/17 217 lb (98.4 kg)  10/18/17 219 lb (99.3 kg)    General: Patient appears comfortable at rest. HEENT: Conjunctiva and lids normal, oropharynx clear. Neck: Supple, no elevated JVP or carotid bruits, no thyromegaly. Lungs: Clear to auscultation, nonlabored breathing at rest. Cardiac: Regular rate and rhythm, no S3, soft systolic murmur, no pericardial rub. Abdomen: Soft, nontender, bowel sounds present, no guarding or rebound. Extremities: No pitting edema, distal pulses 2+. Skin: Warm and dry. Musculoskeletal: No kyphosis. Neuropsychiatric: Alert and oriented x3, affect grossly appropriate.  ECG: I personally reviewed the tracing from 10/10/2017 which showed normal sinus rhythm.  Recent Labwork: 10/18/2017: Brain Natriuretic Peptide 10; BUN 13; Creat 0.86; Hemoglobin 14.1; Platelets 282;  Potassium 4.4; Sodium 140; TSH 1.38     Component Value Date/Time   CHOL 167 10/23/2017 0857   TRIG 87 10/23/2017 0857   HDL 46 10/23/2017 0857   CHOLHDL 3.6 10/23/2017 0857   VLDL 17 10/23/2017 0857   LDLCALC 104 (H) 10/23/2017 0857    Other Studies Reviewed Today:  Echocardiogram 10/23/2017: Study Conclusions  - Left ventricle: The cavity size was normal. Wall thickness was   increased increased in a pattern of mild to moderate LVH.   Systolic function was normal. The estimated ejection fraction was   in the range of 55% to 60%. Wall motion was normal; there were no   regional wall motion abnormalities. Doppler parameters  are   consistent with abnormal left ventricular relaxation (grade 1   diastolic dysfunction). - Aortic valve: Mildly calcified annulus. Trileaflet; mildly   thickened leaflets. Valve area (VTI): 2.54 cm^2. Valve area   (Vmax): 2.26 cm^2. - Mitral valve: Mildly calcified annulus. Normal thickness leaflets. - Technically adequate study.  Exercise Myoview 12/18/2016:  Blood pressure demonstrated a hypertensive response to exercise.  The study is normal. No myocardial ischemia or scar.  This is a low risk study.  Nuclear stress EF: 59%.  Assessment and Plan:  1.  Dyspnea on exertion and accelerating angina symptoms on medical therapy.  Myoview from April of last year demonstrated no active ischemia.  We have discussed proceeding to a diagnostic cardiac catheterization in light of his progressive symptoms.  After reviewing the risks and benefits, he is in agreement to proceed.  2.  CAD status post DES x3 to the RCA and DES to the obtuse marginal in 2011 in [place].  He did have moderate residual left system disease that was managed medically.  No changes made to current medical regimen pending angiography.  3.  Essential hypertension, continue with current regimen.  4.  Mixed hyperlipidemia, on Zetia and Pravachol.  Recent LDL 104.  If not able to tolerate  higher dose Pravachol, may need to consider PCSK9 inhibitor.  He has had other statin intolerances in the past.  Current medicines were reviewed with the patient today.  Disposition: Follow-up after procedure.  Signed, Satira Sark, MD, [facility] 12/27/2017 12:38 PM    [facility] [place], [place], [place] [address] Phone: [phone]; Fax: [phone]

## 2017-12-25 NOTE — Progress Notes (Signed)
Cardiology Office Note  Date: 12/27/2017   ID: Richard Webb, DOB 1945-11-20, MRN [id number]  PCP: [name], MD  Primary Cardiologist: Rozann Lesches, MD   Chief Complaint  Patient presents with  . Coronary Artery Disease  . Dyspnea on exertion    History of Present Illness: Richard Webb is a 72 y.o. male last seen by Ms. Strader PA-C in March.  He presents with his wife today for a follow-up visit.  I reviewed his interval medication adjustments and testing.  He states that he has had progressive dyspnea on exertion over a period of months, also exertional chest tightness.  His wife mentions that she has noticed him having limitations with exertion as well.  He reports compliance with his medications.  He tries to stay active, still works as a Theme park manager.  Follow-up echocardiogram in March revealed LVEF 55 to 60% with grade 1 diastolic dysfunction.  Myoview from last April was overall low risk without significant ischemia.  Cardiac history is reviewed below.  He underwent placement of DES x3 to the RCA and DES to the circumflex in 2011 in [place].  He did have residual left system disease at that time that was managed medically.  Today we discussed proceeding to a diagnostic cardiac catheterization in light of his progressive symptoms on medical therapy.  After reviewing the risks and benefits, he is in agreement to proceed.  Past Medical History:  Diagnosis Date  . Arthritis   . Asthma   . Coronary atherosclerosis of native coronary artery    a. DES x 3 RCA and DES mCx 10/11 - [place] - with residual 50-70% stenosis in the distal Cx in AV groove, 70% distal LAD beyond apex, 70% small D1.  Marland Kitchen GERD (gastroesophageal reflux disease)   . History of kidney stones   . Mixed hyperlipidemia   . Obstructive sleep apnea   . Pulmonary hypertension (HCC)    PASP 50 mmHg  . Restless leg syndrome   . Sarcoidosis   . Statin intolerance   . Ulcerative colitis [facility])     Past  Surgical History:  Procedure Laterality Date  . Ankle fracture repair Left 1985  . CARDIAC CATHETERIZATION     stents x4  . CHOLECYSTECTOMY  1970  . CYSTOSCOPY WITH INSERTION OF UROLIFT N/A 08/27/2017   Procedure: CYSTOSCOPY WITH INSERTION OF UROLIFT;  Surgeon: Cleon Gustin, MD;  Location: [facility];  Service: Urology;  Laterality: N/A;  . LAPAROSCOPIC APPENDECTOMY  2013  . Right total knee replacement Bilateral 2005    Current Outpatient Medications  Medication Sig Dispense Refill  . amLODipine (NORVASC) 2.5 MG tablet Take 1 tablet (2.5 mg total) by mouth daily. 90 tablet 3  . aspirin 81 MG tablet Take 81 mg by mouth daily.    Marland Kitchen BREO ELLIPTA 200-25 MCG/INH AEPB Inhale 1 Inhaler into the lungs daily.  3  . carbidopa-levodopa (SINEMET IR) 10-100 MG per tablet Take 1-2 tablets by mouth at bedtime.     . cetirizine (ZYRTEC) 10 MG tablet Take 10 mg by mouth daily.    . Cyanocobalamin (B-12) 1000 MCG/ML KIT Inject 1,000 mcg as directed every 30 (thirty) days.    Marland Kitchen dicyclomine (BENTYL) 10 MG capsule Take 1 capsule by mouth 4 (four) times daily as needed.     Marland Kitchen EPINEPHrine (EPIPEN JR) 0.15 MG/0.3ML injection Inject 0.15 mg into the muscle as needed.    . ezetimibe (ZETIA) 10 MG tablet Take 1 tablet (10 mg total) by mouth  daily. 90 tablet 3  . gabapentin (NEURONTIN) 300 MG capsule Take 1 capsule by mouth 3 (three) times daily.    Marland Kitchen HYDROcodone-acetaminophen (NORCO/VICODIN) 5-325 MG tablet Take 1 tablet by mouth every 8 (eight) hours as needed for pain.     Marland Kitchen losartan (COZAAR) 50 MG tablet Take 50 mg by mouth daily.    . meloxicam (MOBIC) 7.5 MG tablet Take 7.5 mg by mouth daily.     . montelukast (SINGULAIR) 10 MG tablet Take 10 mg by mouth at bedtime.    . nitroGLYCERIN (NITROSTAT) 0.4 MG SL tablet Place 1 tablet (0.4 mg total) under the tongue every 5 (five) minutes as needed for chest pain. 90 tablet 3  . NON FORMULARY CPAP UAD    . pravastatin (PRAVACHOL) 20 MG tablet Take 20 mg by mouth  daily.    Marland Kitchen sulfaSALAzine (AZULFIDINE) 500 MG tablet Take 1,000 mg by mouth 2 (two) times daily.    . temazepam (RESTORIL) 30 MG capsule Take 30 mg by mouth at bedtime as needed for sleep.     . Vitamin D, Ergocalciferol, (DRISDOL) 50000 UNITS CAPS capsule Take 50,000 Units by mouth every 7 (seven) days. fridays     No current facility-administered medications for this visit.    Allergies:  Statins; Crestor [rosuvastatin]; and Simvastatin   Social History: The patient  reports that he has never smoked. He has never used smokeless tobacco. He reports that he does not drink alcohol or use drugs.   Family History: The patient's family history includes CAD in his father; Colon cancer in his mother.   ROS:  Please see the history of present illness. Otherwise, complete review of systems is positive for dyspnea on exertion and fatigue.  All other systems are reviewed and negative.   Physical Exam: VS:  BP 138/77   Pulse 67   Ht 5' 11"  (1.803 m)   Wt 222 lb (100.7 kg)   BMI 30.96 kg/m , BMI Body mass index is 30.96 kg/m.  Wt Readings from Last 3 Encounters:  12/27/17 222 lb (100.7 kg)  11/15/17 217 lb (98.4 kg)  10/18/17 219 lb (99.3 kg)    General: Patient appears comfortable at rest. HEENT: Conjunctiva and lids normal, oropharynx clear. Neck: Supple, no elevated JVP or carotid bruits, no thyromegaly. Lungs: Clear to auscultation, nonlabored breathing at rest. Cardiac: Regular rate and rhythm, no S3, soft systolic murmur, no pericardial rub. Abdomen: Soft, nontender, bowel sounds present, no guarding or rebound. Extremities: No pitting edema, distal pulses 2+. Skin: Warm and dry. Musculoskeletal: No kyphosis. Neuropsychiatric: Alert and oriented x3, affect grossly appropriate.  ECG: I personally reviewed the tracing from 10/10/2017 which showed normal sinus rhythm.  Recent Labwork: 10/18/2017: Brain Natriuretic Peptide 10; BUN 13; Creat 0.86; Hemoglobin 14.1; Platelets 282;  Potassium 4.4; Sodium 140; TSH 1.38     Component Value Date/Time   CHOL 167 10/23/2017 0857   TRIG 87 10/23/2017 0857   HDL 46 10/23/2017 0857   CHOLHDL 3.6 10/23/2017 0857   VLDL 17 10/23/2017 0857   LDLCALC 104 (H) 10/23/2017 0857    Other Studies Reviewed Today:  Echocardiogram 10/23/2017: Study Conclusions  - Left ventricle: The cavity size was normal. Wall thickness was   increased increased in a pattern of mild to moderate LVH.   Systolic function was normal. The estimated ejection fraction was   in the range of 55% to 60%. Wall motion was normal; there were no   regional wall motion abnormalities. Doppler parameters  are   consistent with abnormal left ventricular relaxation (grade 1   diastolic dysfunction). - Aortic valve: Mildly calcified annulus. Trileaflet; mildly   thickened leaflets. Valve area (VTI): 2.54 cm^2. Valve area   (Vmax): 2.26 cm^2. - Mitral valve: Mildly calcified annulus. Normal thickness leaflets. - Technically adequate study.  Exercise Myoview 12/18/2016:  Blood pressure demonstrated a hypertensive response to exercise.  The study is normal. No myocardial ischemia or scar.  This is a low risk study.  Nuclear stress EF: 59%.  Assessment and Plan:  1.  Dyspnea on exertion and accelerating angina symptoms on medical therapy.  Myoview from April of last year demonstrated no active ischemia.  We have discussed proceeding to a diagnostic cardiac catheterization in light of his progressive symptoms.  After reviewing the risks and benefits, he is in agreement to proceed.  2.  CAD status post DES x3 to the RCA and DES to the obtuse marginal in 2011 in [place].  He did have moderate residual left system disease that was managed medically.  No changes made to current medical regimen pending angiography.  3.  Essential hypertension, continue with current regimen.  4.  Mixed hyperlipidemia, on Zetia and Pravachol.  Recent LDL 104.  If not able to tolerate  higher dose Pravachol, may need to consider PCSK9 inhibitor.  He has had other statin intolerances in the past.  Current medicines were reviewed with the patient today.  Disposition: Follow-up after procedure.  Signed, Satira Sark, MD, [facility] 12/27/2017 12:38 PM    [place] at [place], [place], [place] [address] Phone: [phone]; Fax: [phone]

## 2017-12-27 ENCOUNTER — Other Ambulatory Visit: Payer: Self-pay | Admitting: Cardiology

## 2017-12-27 ENCOUNTER — Encounter: Payer: Self-pay | Admitting: Cardiology

## 2017-12-27 ENCOUNTER — Ambulatory Visit (INDEPENDENT_AMBULATORY_CARE_PROVIDER_SITE_OTHER): Payer: Medicare Other | Admitting: Cardiology

## 2017-12-27 ENCOUNTER — Telehealth: Payer: Self-pay | Admitting: Cardiology

## 2017-12-27 VITALS — BP 138/77 | HR 67 | Ht 71.0 in | Wt 222.0 lb

## 2017-12-27 DIAGNOSIS — I10 Essential (primary) hypertension: Secondary | ICD-10-CM | POA: Diagnosis not present

## 2017-12-27 DIAGNOSIS — R0609 Other forms of dyspnea: Secondary | ICD-10-CM | POA: Diagnosis not present

## 2017-12-27 DIAGNOSIS — I25119 Atherosclerotic heart disease of native coronary artery with unspecified angina pectoris: Secondary | ICD-10-CM

## 2017-12-27 DIAGNOSIS — E782 Mixed hyperlipidemia: Secondary | ICD-10-CM

## 2017-12-27 DIAGNOSIS — I200 Unstable angina: Secondary | ICD-10-CM | POA: Diagnosis not present

## 2017-12-27 DIAGNOSIS — I2510 Atherosclerotic heart disease of native coronary artery without angina pectoris: Secondary | ICD-10-CM | POA: Diagnosis not present

## 2017-12-27 MED ORDER — NITROGLYCERIN 0.4 MG SL SUBL: 0.4000 mg | SUBLINGUAL_TABLET | SUBLINGUAL | 3 refills | Status: DC | PRN

## 2017-12-27 NOTE — Patient Instructions (Signed)
Medication Instructions:  Your physician recommends that you continue on your current medications as directed. Please refer to the Current Medication list given to you today.  Labwork: NONE  Testing/Procedures:   [place] [address] Dept: [phone] Loc: [phone]  [place]  12/27/2017  You are scheduled for a Cardiac Catheterization on Wednesday, May 15 with Dr. [name].  1. Please arrive at the [facility] (Main Entrance A) at [facility] [address]: [address] [place], [place] [address] at 11:00 AM (two hours before your procedure to ensure your preparation). Free valet parking service is available.   Special note: Every effort is made to have your procedure done on time. Please understand that emergencies sometimes delay scheduled procedures.  2. Diet: Do not eat or drink anything after midnight prior to your procedure except sips of water to take medications.  3. Labs: Your labs will be performed at the hospital after you arrive for your procedure.  4. Medication instructions in preparation for your procedure:   On the morning of your procedure, take your Aspirin and any morning medicines NOT listed above.  You may use sips of water.  5. Plan for one night stay--bring personal belongings. 6. Bring a current list of your medications and current insurance cards. 7. You MUST have a responsible person to drive you home. 8. Someone MUST be with you the first 24 hours after you arrive home or your discharge will be delayed. 9. Please wear clothes that are easy to get on and off and wear slip-on shoes.  Thank you for allowing [place] to care for you!   -- [facility]  Follow-Up: Your physician recommends that you schedule a follow-up appointment in: [place]  Any Other Special Instructions  Will Be Listed Below (If Applicable).  If you need a refill on your cardiac medications before your next appointment, please call your pharmacy.

## 2017-12-27 NOTE — Telephone Encounter (Signed)
Patient forgot to ask about if appointment is still need with pharmacist on Jan 17, 2018 at [facility]

## 2017-12-27 NOTE — Telephone Encounter (Signed)
Patient notified that he needed to keep this appointment.

## 2017-12-27 NOTE — Telephone Encounter (Signed)
L heart cath Richard Webb 5/15 at 1:00

## 2017-12-27 NOTE — Telephone Encounter (Signed)
LMTCB

## 2017-12-31 ENCOUNTER — Ambulatory Visit: Payer: Medicare Other

## 2018-01-01 ENCOUNTER — Telehealth: Payer: Self-pay | Admitting: *Deleted

## 2018-01-01 NOTE — Telephone Encounter (Signed)
Pt contacted pre-catheterization scheduled at [facility] for: Wednesday Jan 02, 2018 1 PM Verified arrival time and place: [place] Entrance A at: 11 AM  No solid food after midnight prior to cath, clear liquids until 5 AM day of procedure. Verified allergies in Epic Verified no diabetes medications.  AM meds can be  taken pre-cath with sip of water including: ASA 81 mg  Confirmed patient has responsible person to drive home post procedure and observe patient for 24 hours: yes

## 2018-01-02 ENCOUNTER — Encounter (HOSPITAL_COMMUNITY): Payer: Self-pay | Admitting: Cardiovascular Disease

## 2018-01-02 ENCOUNTER — Ambulatory Visit (HOSPITAL_COMMUNITY)
Admission: RE | Admit: 2018-01-02 | Discharge: 2018-01-02 | Disposition: A | Discharge status: Home / Self Care | Payer: Medicare Other | Source: Ambulatory Visit | Attending: Cardiovascular Disease | Admitting: Cardiovascular Disease

## 2018-01-02 ENCOUNTER — Encounter (HOSPITAL_COMMUNITY)
Admission: RE | Disposition: A | Discharge status: Home / Self Care | Payer: Self-pay | Source: Ambulatory Visit | Attending: Cardiovascular Disease

## 2018-01-02 DIAGNOSIS — E782 Mixed hyperlipidemia: Secondary | ICD-10-CM | POA: Diagnosis not present

## 2018-01-02 DIAGNOSIS — D869 Sarcoidosis, unspecified: Secondary | ICD-10-CM | POA: Diagnosis not present

## 2018-01-02 DIAGNOSIS — Z96653 Presence of artificial knee joint, bilateral: Secondary | ICD-10-CM | POA: Insufficient documentation

## 2018-01-02 DIAGNOSIS — Z888 Allergy status to other drugs, medicaments and biological substances status: Secondary | ICD-10-CM | POA: Insufficient documentation

## 2018-01-02 DIAGNOSIS — Z9049 Acquired absence of other specified parts of digestive tract: Secondary | ICD-10-CM | POA: Diagnosis not present

## 2018-01-02 DIAGNOSIS — G2581 Restless legs syndrome: Secondary | ICD-10-CM | POA: Diagnosis not present

## 2018-01-02 DIAGNOSIS — I10 Essential (primary) hypertension: Secondary | ICD-10-CM | POA: Insufficient documentation

## 2018-01-02 DIAGNOSIS — G4733 Obstructive sleep apnea (adult) (pediatric): Secondary | ICD-10-CM | POA: Insufficient documentation

## 2018-01-02 DIAGNOSIS — Z955 Presence of coronary angioplasty implant and graft: Secondary | ICD-10-CM | POA: Diagnosis not present

## 2018-01-02 DIAGNOSIS — K219 Gastro-esophageal reflux disease without esophagitis: Secondary | ICD-10-CM | POA: Diagnosis not present

## 2018-01-02 DIAGNOSIS — Z9889 Other specified postprocedural states: Secondary | ICD-10-CM | POA: Diagnosis not present

## 2018-01-02 DIAGNOSIS — R0609 Other forms of dyspnea: Secondary | ICD-10-CM | POA: Diagnosis not present

## 2018-01-02 DIAGNOSIS — I200 Unstable angina: Secondary | ICD-10-CM

## 2018-01-02 DIAGNOSIS — Z79899 Other long term (current) drug therapy: Secondary | ICD-10-CM | POA: Diagnosis not present

## 2018-01-02 DIAGNOSIS — Z8249 Family history of ischemic heart disease and other diseases of the circulatory system: Secondary | ICD-10-CM | POA: Diagnosis not present

## 2018-01-02 DIAGNOSIS — J45909 Unspecified asthma, uncomplicated: Secondary | ICD-10-CM | POA: Diagnosis not present

## 2018-01-02 DIAGNOSIS — M1990 Unspecified osteoarthritis, unspecified site: Secondary | ICD-10-CM | POA: Insufficient documentation

## 2018-01-02 DIAGNOSIS — I25110 Atherosclerotic heart disease of native coronary artery with unstable angina pectoris: Secondary | ICD-10-CM

## 2018-01-02 DIAGNOSIS — Z7982 Long term (current) use of aspirin: Secondary | ICD-10-CM | POA: Diagnosis not present

## 2018-01-02 DIAGNOSIS — I272 Pulmonary hypertension, unspecified: Secondary | ICD-10-CM | POA: Diagnosis not present

## 2018-01-02 DIAGNOSIS — Z87442 Personal history of urinary calculi: Secondary | ICD-10-CM | POA: Insufficient documentation

## 2018-01-02 HISTORY — PX: LEFT HEART CATH AND CORONARY ANGIOGRAPHY: CATH118249

## 2018-01-02 LAB — BASIC METABOLIC PANEL
ANION GAP: 8 (ref 5–15)
BUN: 9 mg/dL (ref 6–20)
CALCIUM: 9.3 mg/dL (ref 8.9–10.3)
CO2: 26 mmol/L (ref 22–32)
Chloride: 106 mmol/L (ref 101–111)
Creatinine, Ser: 0.83 mg/dL (ref 0.61–1.24)
GFR calc Af Amer: >60 mL/min (ref >60)
GLUCOSE: 99 mg/dL (ref 65–99)
Potassium: 3.9 mmol/L (ref 3.5–5.1)
SODIUM: 140 mmol/L (ref 135–145)

## 2018-01-02 LAB — CBC
HCT: 41.8 % (ref 39.0–52.0)
HEMOGLOBIN: 13 g/dL (ref 13.0–17.0)
MCH: 27 pg (ref 26.0–34.0)
MCHC: 31.1 g/dL (ref 30.0–36.0)
MCV: 86.9 fL (ref 78.0–100.0)
Platelets: 287 10*3/uL (ref 150–400)
RBC: 4.81 MIL/uL (ref 4.22–5.81)
RDW: 15.8 % — ABNORMAL HIGH (ref 11.5–15.5)
WBC: 8.1 10*3/uL (ref 4.0–10.5)

## 2018-01-02 SURGERY — LEFT HEART CATH AND CORONARY ANGIOGRAPHY
Anesthesia: LOCAL

## 2018-01-02 MED ORDER — HEPARIN (PORCINE) IN NACL 1000-0.9 UT/500ML-% IV SOLN
INTRAVENOUS | Status: AC
  Filled 2018-01-02: qty 1000

## 2018-01-02 MED ORDER — PRAVASTATIN SODIUM 40 MG PO TABS: 40.0000 mg | ORAL_TABLET | Freq: Every day | ORAL | 2 refills | Status: DC

## 2018-01-02 MED ORDER — ASPIRIN 81 MG PO CHEW: 81.0000 mg | CHEWABLE_TABLET | Freq: Every day | ORAL | Status: DC

## 2018-01-02 MED ORDER — PRAVASTATIN SODIUM 40 MG PO TABS: 40.0000 mg | ORAL_TABLET | Freq: Every day | ORAL | Status: DC

## 2018-01-02 MED ORDER — ASPIRIN 81 MG PO CHEW: 81.0000 mg | CHEWABLE_TABLET | ORAL | Status: DC

## 2018-01-02 MED ORDER — SODIUM CHLORIDE 0.9 % WEIGHT BASED INFUSION
3.0000 mL/kg/h | INTRAVENOUS | Status: AC
  Administered 2018-01-02: 3 mL/kg/h via INTRAVENOUS

## 2018-01-02 MED ORDER — IOPAMIDOL (ISOVUE-370) INJECTION 76%
INTRAVENOUS | Status: DC | PRN
  Administered 2018-01-02: 100 mL via INTRA_ARTERIAL

## 2018-01-02 MED ORDER — SODIUM CHLORIDE 0.9 % IV SOLN: INTRAVENOUS | Status: DC

## 2018-01-02 MED ORDER — HEPARIN SODIUM (PORCINE) 1000 UNIT/ML IJ SOLN
INTRAMUSCULAR | Status: AC
  Filled 2018-01-02: qty 1

## 2018-01-02 MED ORDER — HEPARIN (PORCINE) IN NACL 2-0.9 UNITS/ML
INTRAMUSCULAR | Status: AC | PRN
  Administered 2018-01-02 (×2): 500 mL

## 2018-01-02 MED ORDER — SODIUM CHLORIDE 0.9% FLUSH: 3.0000 mL | INTRAVENOUS | Status: DC | PRN

## 2018-01-02 MED ORDER — VERAPAMIL HCL 2.5 MG/ML IV SOLN
INTRAVENOUS | Status: AC
  Filled 2018-01-02: qty 2

## 2018-01-02 MED ORDER — MIDAZOLAM HCL 2 MG/2ML IJ SOLN
INTRAMUSCULAR | Status: AC
  Filled 2018-01-02: qty 2

## 2018-01-02 MED ORDER — FENTANYL CITRATE (PF) 100 MCG/2ML IJ SOLN
INTRAMUSCULAR | Status: DC | PRN
  Administered 2018-01-02: 50 ug via INTRAVENOUS

## 2018-01-02 MED ORDER — HEPARIN SODIUM (PORCINE) 1000 UNIT/ML IJ SOLN
INTRAMUSCULAR | Status: DC | PRN
  Administered 2018-01-02: 5000 [IU] via INTRAVENOUS

## 2018-01-02 MED ORDER — SODIUM CHLORIDE 0.9 % IV SOLN: 250.0000 mL | INTRAVENOUS | Status: DC | PRN

## 2018-01-02 MED ORDER — SODIUM CHLORIDE 0.9% FLUSH: 3.0000 mL | Freq: Two times a day (BID) | INTRAVENOUS | Status: DC

## 2018-01-02 MED ORDER — METOPROLOL TARTRATE 25 MG PO TABS: 25.0000 mg | ORAL_TABLET | Freq: Two times a day (BID) | ORAL | 2 refills | Status: DC

## 2018-01-02 MED ORDER — IOPAMIDOL (ISOVUE-370) INJECTION 76%
INTRAVENOUS | Status: AC
  Filled 2018-01-02: qty 150

## 2018-01-02 MED ORDER — FENTANYL CITRATE (PF) 100 MCG/2ML IJ SOLN
INTRAMUSCULAR | Status: AC
  Filled 2018-01-02: qty 2

## 2018-01-02 MED ORDER — IOPAMIDOL (ISOVUE-370) INJECTION 76%
INTRAVENOUS | Status: AC
  Filled 2018-01-02: qty 100

## 2018-01-02 MED ORDER — METOPROLOL TARTRATE 25 MG PO TABS: 25.0000 mg | ORAL_TABLET | Freq: Two times a day (BID) | ORAL | Status: DC

## 2018-01-02 MED ORDER — DIAZEPAM 5 MG PO TABS: 5.0000 mg | ORAL_TABLET | ORAL | Status: DC | PRN

## 2018-01-02 MED ORDER — MIDAZOLAM HCL 2 MG/2ML IJ SOLN
INTRAMUSCULAR | Status: DC | PRN
  Administered 2018-01-02: 2 mg via INTRAVENOUS

## 2018-01-02 MED ORDER — ONDANSETRON HCL 4 MG/2ML IJ SOLN: 4.0000 mg | Freq: Four times a day (QID) | INTRAMUSCULAR | Status: DC | PRN

## 2018-01-02 MED ORDER — SODIUM CHLORIDE 0.9 % WEIGHT BASED INFUSION: 1.0000 mL/kg/h | INTRAVENOUS | Status: DC

## 2018-01-02 MED ORDER — ISOSORBIDE MONONITRATE ER 30 MG PO TB24: 30.0000 mg | ORAL_TABLET | Freq: Every day | ORAL | Status: DC

## 2018-01-02 MED ORDER — LIDOCAINE HCL (PF) 1 % IJ SOLN
INTRAMUSCULAR | Status: AC
  Filled 2018-01-02: qty 30

## 2018-01-02 MED ORDER — ISOSORBIDE MONONITRATE ER 30 MG PO TB24: 30.0000 mg | ORAL_TABLET | Freq: Every day | ORAL | 2 refills | Status: DC

## 2018-01-02 MED ORDER — IOPAMIDOL (ISOVUE-370) INJECTION 76%
INTRAVENOUS | Status: AC
  Filled 2018-01-02: qty 50

## 2018-01-02 MED ORDER — ACETAMINOPHEN 325 MG PO TABS: 650.0000 mg | ORAL_TABLET | ORAL | Status: DC | PRN

## 2018-01-02 MED ORDER — VERAPAMIL HCL 2.5 MG/ML IV SOLN
INTRAVENOUS | Status: DC | PRN
  Administered 2018-01-02: 10 mL via INTRA_ARTERIAL

## 2018-01-02 MED ORDER — LIDOCAINE HCL (PF) 1 % IJ SOLN
INTRAMUSCULAR | Status: DC | PRN
  Administered 2018-01-02: 2 mL

## 2018-01-02 SURGICAL SUPPLY — 12 items
CATH INFINITI 5FR ANG PIGTAIL (CATHETERS) ×2 IMPLANT
CATH OPTITORQUE TIG 4.0 5F (CATHETERS) ×2 IMPLANT
DEVICE RAD COMP TR BAND LRG (VASCULAR PRODUCTS) ×2 IMPLANT
GUIDEWIRE INQWIRE 1.5J.035X260 (WIRE) ×1 IMPLANT
INQWIRE 1.5J .035X260CM (WIRE) ×2
KIT HEART LEFT (KITS) ×2 IMPLANT
NEEDLE PERC 21GX4CM (NEEDLE) ×2 IMPLANT
PACK CARDIAC CATHETERIZATION (CUSTOM PROCEDURE TRAY) ×2 IMPLANT
SHEATH RAIN RADIAL 21G 6FR (SHEATH) ×2 IMPLANT
SYR MEDRAD MARK V 150ML (SYRINGE) ×2 IMPLANT
TRANSDUCER W/STOPCOCK (MISCELLANEOUS) ×2 IMPLANT
TUBING CIL FLEX 10 FLL-RA (TUBING) ×2 IMPLANT

## 2018-01-02 NOTE — Interval H&P Note (Signed)
Cath Lab Visit (complete for each Cath Lab visit)  Clinical Evaluation Leading to the Procedure:   ACS: No.  Non-ACS:    Anginal Classification: CCS III  Anti-ischemic medical therapy: Minimal Therapy (1 class of medications)  Non-Invasive Test Results: No non-invasive testing performed  Prior CABG: No previous CABG      History and Physical Interval Note:  01/02/2018 12:42 PM  Richard Webb  has presented today for surgery, with the diagnosis of accelerating angina  The various methods of treatment have been discussed with the patient and family. After consideration of risks, benefits and other options for treatment, the patient has consented to  Procedure(s): LEFT HEART CATH AND CORONARY ANGIOGRAPHY (N/A) as a surgical intervention .  The patient's history has been reviewed, patient examined, no change in status, stable for surgery.  I have reviewed the patient's chart and labs.  Questions were answered to the patient's satisfaction.     [name]

## 2018-01-02 NOTE — Discharge Instructions (Signed)
°  DRINK PLENTY OF FLUIDS FOR THE NEXT 2-3 DAYS TO KEEP HYDRATED.   Radial Site Care Refer to this sheet in the next few weeks. These instructions provide you with information about caring for yourself after your procedure. Your health care provider may also give you more specific instructions. Your treatment has been planned according to current medical practices, but problems sometimes occur. Call your health care provider if you have any problems or questions after your procedure. What can I expect after the procedure? After your procedure, it is typical to have the following:  Bruising at the radial site that usually fades within 1-2 weeks.  Blood collecting in the tissue (hematoma) that may be painful to the touch. It should usually decrease in size and tenderness within 1-2 weeks.  Follow these instructions at home:  Take medicines only as directed by your health care provider.  You may shower 24-48 hours after the procedure or as directed by your health care provider. Remove the bandage (dressing) and gently wash the site with plain soap and water. Pat the area dry with a clean towel. Do not rub the site, because this may cause bleeding.  Do not take baths, swim, or use a hot tub until your health care provider approves.  Check your insertion site every day for redness, swelling, or drainage.  Do not apply powder or lotion to the site.  Do not flex or bend the affected arm for 24 hours or as directed by your health care provider.  Do not push or pull heavy objects with the affected arm for 24 hours or as directed by your health care provider.  Do not lift over 10 lb (4.5 kg) for 5 days after your procedure or as directed by your health care provider.  Ask your health care provider when it is okay to: ? Return to work or school. ? Resume usual physical activities or sports. ? Resume sexual activity.  Do not drive home if you are discharged the same day as the procedure. Have  someone else drive you.  You may drive 24 hours after the procedure unless otherwise instructed by your health care provider.  Do not operate machinery or power tools for 24 hours after the procedure.  If your procedure was done as an outpatient procedure, which means that you went home the same day as your procedure, a responsible adult should be with you for the first 24 hours after you arrive home.  Keep all follow-up visits as directed by your health care provider. This is important. Contact a health care provider if:  You have a fever.  You have chills.  You have increased bleeding from the radial site. Hold pressure on the site. Get help right away if:  You have unusual pain at the radial site.  You have redness, warmth, or swelling at the radial site.  You have drainage (other than a small amount of blood on the dressing) from the radial site.  The radial site is bleeding, and the bleeding does not stop after 30 minutes of holding steady pressure on the site.  Your arm or hand becomes pale, cool, tingly, or numb. This information is not intended to replace advice given to you by your health care provider. Make sure you discuss any questions you have with your health care provider. Document Released: 09/09/2010 Document Revised: 01/13/2016 Document Reviewed: 02/23/2014 Elsevier Interactive Patient Education  2018 [employer].

## 2018-01-03 MED FILL — Heparin Sod (Porcine)-NaCl IV Soln 1000 Unit/500ML-0.9%: INTRAVENOUS | Qty: 1000 | Status: AC

## 2018-01-04 ENCOUNTER — Telehealth: Payer: Self-pay | Admitting: Cardiology

## 2018-01-04 DIAGNOSIS — Z6830 Body mass index (BMI) 30.0-30.9, adult: Secondary | ICD-10-CM | POA: Diagnosis not present

## 2018-01-04 DIAGNOSIS — J479 Bronchiectasis, uncomplicated: Secondary | ICD-10-CM | POA: Diagnosis not present

## 2018-01-04 DIAGNOSIS — E7800 Pure hypercholesterolemia, unspecified: Secondary | ICD-10-CM | POA: Diagnosis not present

## 2018-01-04 DIAGNOSIS — K5190 Ulcerative colitis, unspecified, without complications: Secondary | ICD-10-CM | POA: Diagnosis not present

## 2018-01-04 DIAGNOSIS — I10 Essential (primary) hypertension: Secondary | ICD-10-CM | POA: Diagnosis not present

## 2018-01-04 DIAGNOSIS — I2510 Atherosclerotic heart disease of native coronary artery without angina pectoris: Secondary | ICD-10-CM | POA: Diagnosis not present

## 2018-01-04 DIAGNOSIS — L409 Psoriasis, unspecified: Secondary | ICD-10-CM | POA: Diagnosis not present

## 2018-01-04 DIAGNOSIS — Z299 Encounter for prophylactic measures, unspecified: Secondary | ICD-10-CM | POA: Diagnosis not present

## 2018-01-04 DIAGNOSIS — G4700 Insomnia, unspecified: Secondary | ICD-10-CM | POA: Diagnosis not present

## 2018-01-04 NOTE — Telephone Encounter (Signed)
Discussed cath results with patient.  No new medications were started upon discharge from heart cath.  Post cath visit has been scheduled for 01/24/2018 with Bernerd Pho, [place] in our [place] office.  Patient concerned that his appointment is not with MD.  Recommendations were made, but nothing new given per patient.  Please review cath note to see if you feel anything new needs to be started prior to his follow up.

## 2018-01-04 NOTE — Telephone Encounter (Signed)
Patient asking for someone to go over his Cath Results.

## 2018-01-06 NOTE — Telephone Encounter (Signed)
I reviewed the cardiac catheterization report, Dr. Claiborne Billings did his procedure but made no medication changes. Would suggest increasing Norvasc to 5 mg daily and also see if he can increase Pravachol to 40 mg daily. Next could consider addition of low dose Imdur if needed. [place] is excellent and would suggest he keep visit scheduled on June 6.

## 2018-01-07 MED ORDER — AMLODIPINE BESYLATE 5 MG PO TABS: 5.0000 mg | ORAL_TABLET | Freq: Every day | ORAL | 3 refills | Status: DC

## 2018-01-07 MED ORDER — PRAVASTATIN SODIUM 40 MG PO TABS: 40.0000 mg | ORAL_TABLET | Freq: Every day | ORAL | 2 refills | Status: DC

## 2018-01-07 NOTE — Telephone Encounter (Signed)
Wife Vaughan Basta) notified.  Please clarify the Pravastatin dose.  We already have him doing 9m daily & confirmed that dose with wife as well.

## 2018-01-07 NOTE — Telephone Encounter (Signed)
Wife Vaughan Basta) notified.  She did clarify that he in fact was on the 44m, was mistaken earlier and did double check on his bottle.  Will send new prescriptions for the Norvasc 543mdaily & the Pravastatin 4037maily to [facility]

## 2018-01-07 NOTE — Telephone Encounter (Signed)
Sorry, my last note indicated Pravachol at 20 mg daily.  If he is actually taking it at 40 mg daily, would increase to 80 mg daily.

## 2018-01-16 NOTE — Progress Notes (Deleted)
Patient ID: Richard Webb                 DOB: 08-10-1946                    MRN: [id number]     HPI: Richard Webb is a 72 y.o. male patient of Dr. Domenic Polite, referred by Melina Copa, PA that presents today for lipid evaluation.  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 At his most recent OV pravastatin 67m was continued due to muscle aches at increased dose. Ezetimibe was added to regimen for additional LDL lowering.   He presents today for cholesterol follow up.   Risk Factors: CAD Goal: LDL <70, non HDL <100  Current Medications: pravastatin 278mdaily, ezetimibe 1024maily  Intolerances: rosuvastatin (myalgia), simvastatin 16m29mily (myalgia), niaspan (burning)  Diet: Mostly eats from home. Eats mostly baked and does eats a lot of vegetables. Does have a sweet tooth. Drinks mostly crystal light tea and water. Does have 3-4 cups of coffee with creamer and sweetnlow.   Exercise: No formal exercise. Off and on recently. He is active though around the house.   Family History: father with CAD may have had XOL, mother with colon cancer  Social History: denies tobacco products and alcohol  Labs: 12/20/17:  TC 162, TG 81, HDL 48, LDL 98, nonHDL 114 (pravastatin 20mg65mt days, ezetimibe 10mg 39my) 10/23/17: TC 167, TG 87, HDL 46, LDL 104 (pravastatin 20mg m70mdays)  Past Medical History:  Diagnosis Date  . Arthritis   . Asthma   . Coronary atherosclerosis of native coronary artery    a. DES x 3 RCA and DES mCx 10/11 - [place] - with residual 50-70% stenosis in the distal Cx in AV groove, 70% distal LAD beyond apex, 70% small D1.  . GERD Marland Kitchengastroesophageal reflux disease)   . History of kidney stones   . Mixed hyperlipidemia   . Obstructive sleep apnea   . Pulmonary hypertension (HCC)      PASP 50 mmHg  . Restless leg syndrome   . Sarcoidosis   . Statin intolerance   . Ulcerative colitis (HCC)   [place] on [place] Prior to Visit  Medication Sig Dispense Refill  . amLODipine (NORVASC) 5 MG tablet Take 1 tablet (5 mg total) by mouth daily. 90 tablet 3  . aspirin 81 MG tablet Take 81 mg by mouth daily.    . BREO Marland KitchenLLIPTA 200-25 MCG/INH AEPB Inhale 1 Inhaler into the lungs daily.  3  . carbidopa-levodopa (SINEMET IR) 10-100 MG per tablet Take 1 tablet by mouth at bedtime as needed (restless legs).     . cetirizine (ZYRTEC) 10 MG tablet Take 10 mg by mouth daily.    . Cyanocobalamin (B-12) 1000 MCG/ML KIT Inject 1,000 mcg as directed every 30 (thirty) days.    . dicycMarland Kitchenomine (BENTYL) 10 MG capsule Take 10 mg by mouth 2 (two) times daily as needed for spasms.     . EPINEMarland KitchenHrine (EPIPEN JR) 0.15 MG/0.3ML injection Inject 0.15 mg into the muscle as needed for anaphylaxis.     . ezetiMarland Kitchenibe (ZETIA) 10 MG tablet Take 1 tablet (10 mg total) by mouth daily. 90 tablet 3  . HYDROcodone-acetaminophen (NORCO/VICODIN) 5-325 MG tablet Take 1 tablet by mouth every 8 (eight)  hours as needed for moderate pain.     . hydrocortisone cream 1 % Apply 1 application topically 2 (two) times daily as needed for itching (psoriasis).    . isosorbide mononitrate (IMDUR) 30 MG 24 hr tablet Take 1 tablet (30 mg total) by mouth daily. 30 tablet 2  . losartan (COZAAR) 50 MG tablet Take 50 mg by mouth daily.    . meloxicam (MOBIC) 7.5 MG tablet Take 7.5 mg by mouth daily.     . metoprolol tartrate (LOPRESSOR) 25 MG tablet Take 1 tablet (25 mg total) by mouth 2 (two) times daily. 60 tablet 2  . montelukast (SINGULAIR) 10 MG tablet Take 10 mg by mouth at bedtime.    . nitroGLYCERIN (NITROSTAT) 0.4 MG SL tablet Place 1 tablet (0.4 mg total) under the tongue every 5 (five) minutes as needed for chest pain. 90 tablet 3  . NON FORMULARY CPAP UAD    . oxymetazoline (AFRIN) 0.05 % nasal spray Place  1 spray into both nostrils at bedtime.    . pravastatin (PRAVACHOL) 40 MG tablet Take 1 tablet (40 mg total) by mouth daily at 6 PM. 30 tablet 2  . sulfaSALAzine (AZULFIDINE) 500 MG tablet Take 1,000 mg by mouth 2 (two) times daily.    . temazepam (RESTORIL) 30 MG capsule Take 30 mg by mouth at bedtime as needed for sleep.     . Vitamin D, Ergocalciferol, (DRISDOL) 50000 UNITS CAPS capsule Take 50,000 Units by mouth every 7 (seven) days. fridays     No current facility-administered medications on file prior to visit.     Allergies  Allergen Reactions  . Bee Venom Anaphylaxis  . Statins     Myalgias - Simvastatin and Rosuvastatin    Assessment/Plan: Hyperlipidemia: LDL not at goal <70 and only slight improved with addition of ezetimibe. Add PCSK9i therapy.   Thank you,  Lelan Pons. Patterson Hammersmith, [facility]  01/16/2018 7:54 AM

## 2018-01-17 ENCOUNTER — Ambulatory Visit: Payer: Medicare Other

## 2018-01-18 DIAGNOSIS — H3581 Retinal edema: Secondary | ICD-10-CM | POA: Diagnosis not present

## 2018-01-18 DIAGNOSIS — H35411 Lattice degeneration of retina, right eye: Secondary | ICD-10-CM | POA: Diagnosis not present

## 2018-01-18 DIAGNOSIS — H35373 Puckering of macula, bilateral: Secondary | ICD-10-CM | POA: Diagnosis not present

## 2018-01-18 DIAGNOSIS — H59032 Cystoid macular edema following cataract surgery, left eye: Secondary | ICD-10-CM | POA: Diagnosis not present

## 2018-01-22 NOTE — Progress Notes (Signed)
Cardiology Office Note    Date:  01/24/2018   ID:  Richard Webb, DOB [birth year], MRN [id number]  PCP:  [name], MD  Cardiologist: Rozann Lesches, MD    Chief Complaint  Patient presents with  . Follow-up    s/p cardiac catheterization    History of Present Illness:    Richard Webb is a 72 y.o. male with past medical history of CAD (s/p DESx3 to RCA and DES to mid-LCx in 05/2010, low-risk NST in 11/2016), HTN, HLD, OSA, Pulmonary HTN, Pulmonary sarcoidosis, and asthma  presents to the office today for follow-up from his recent cardiac catheterization.  He was last examined by Dr. Domenic Polite on 12/27/2017 and reported progressive dyspnea on exertion over the past several months with associated chest discomfort.  It was recommended he undergo a repeat cardiac catheterization for definitive evaluation.  This was performed on 01/02/2018 and showed multivessel CAD with 55% proximal LAD stenosis, 70% mid LAD stenosis, 30% distal LAD stenosis, 70% mid RA CA, 95% acute marginal, 60% post Atrio stenosis, 50% Ost 3rd Mrg, and 70% 2nd Diag stenosis. With his diffuse multivessel CAD, aggressive medical therapy was recommended. Lopressor 37m BID was added to his medication regimen at that time of cath. He called the office several days later and Amlodipine was increased to 5 mg daily and Pravachol was increased to 80 mg daily.  In talking with the patient today, he reports having episodes of dyspnea on exertion but does feel like this has improved since his recent medication adjustments. However, he did experience worsening fatigue with Lopressor. Denies any specific episodes of exertional chest discomfort. No recent orthopnea, PND, lower extremity edema, or palpitations.  He thought that he had previously been on Pravachol 40 mg daily but had actually been on 20 mg daily and this was increased to 40 mg daily approximately 2 weeks ago. He reports overall tolerating this well and denies any noted  side effects.  Past Medical History:  Diagnosis Date  . Arthritis   . Asthma   . Coronary atherosclerosis of native coronary artery    a. DES x 3 RCA and DES mCx 10/11 - [place] - with residual 50-70% stenosis in the distal Cx in AV groove, 70% distal LAD beyond apex, 70% small D1. b. 12/2017: similar results with multivessel CAD. Patent stents. Medical management recommended.   .Marland KitchenGERD (gastroesophageal reflux disease)   . History of kidney stones   . Mixed hyperlipidemia   . Obstructive sleep apnea   . Pulmonary hypertension (HCC)    PASP 50 mmHg  . Restless leg syndrome   . Sarcoidosis   . Statin intolerance   . Ulcerative colitis ([facility]     Past Surgical History:  Procedure Laterality Date  . Ankle fracture repair Left 1985  . CARDIAC CATHETERIZATION     stents x4  . CHOLECYSTECTOMY  1970  . CYSTOSCOPY WITH INSERTION OF UROLIFT N/A 08/27/2017   Procedure: CYSTOSCOPY WITH INSERTION OF UROLIFT;  Surgeon: MCleon Gustin MD;  Location: [facility];  Service: Urology;  Laterality: N/A;  . LAPAROSCOPIC APPENDECTOMY  2013  . LEFT HEART CATH AND CORONARY ANGIOGRAPHY N/A 01/02/2018   Procedure: LEFT HEART CATH AND CORONARY ANGIOGRAPHY;  Surgeon: KTroy Sine MD;  [facility];  Service: Cardiovascular;  Laterality: N/A;  . Right total knee replacement Bilateral 2005    Current Medications: Outpatient Medications Prior to Visit  Medication Sig Dispense Refill  . amLODipine (NORVASC) 5 MG tablet Take  1 tablet (5 mg total) by mouth daily. 90 tablet 3  . aspirin 81 MG tablet Take 81 mg by mouth daily.    Marland Kitchen BREO ELLIPTA 200-25 MCG/INH AEPB Inhale 1 Inhaler into the lungs daily.  3  . carbidopa-levodopa (SINEMET IR) 10-100 MG per tablet Take 1 tablet by mouth at bedtime as needed (restless legs).     . cetirizine (ZYRTEC) 10 MG tablet Take 10 mg by mouth daily.    . Cyanocobalamin (B-12) 1000 MCG/ML KIT Inject 1,000 mcg as directed every 30 (thirty) days.    Marland Kitchen  dicyclomine (BENTYL) 10 MG capsule Take 10 mg by mouth 2 (two) times daily as needed for spasms.     Marland Kitchen EPINEPHrine (EPIPEN JR) 0.15 MG/0.3ML injection Inject 0.15 mg into the muscle as needed for anaphylaxis.     Marland Kitchen ezetimibe (ZETIA) 10 MG tablet Take 1 tablet (10 mg total) by mouth daily. 90 tablet 3  . HYDROcodone-acetaminophen (NORCO/VICODIN) 5-325 MG tablet Take 1 tablet by mouth every 8 (eight) hours as needed for moderate pain.     . hydrocortisone cream 1 % Apply 1 application topically 2 (two) times daily as needed for itching (psoriasis).    . isosorbide mononitrate (IMDUR) 30 MG 24 hr tablet Take 1 tablet (30 mg total) by mouth daily. 30 tablet 2  . losartan (COZAAR) 50 MG tablet Take 50 mg by mouth daily.    . meloxicam (MOBIC) 7.5 MG tablet Take 7.5 mg by mouth daily.     . metoprolol tartrate (LOPRESSOR) 25 MG tablet Take 12.5 mg by mouth 2 (two) times daily.    . montelukast (SINGULAIR) 10 MG tablet Take 10 mg by mouth at bedtime.    . nitroGLYCERIN (NITROSTAT) 0.4 MG SL tablet Place 1 tablet (0.4 mg total) under the tongue every 5 (five) minutes as needed for chest pain. 90 tablet 3  . NON FORMULARY CPAP UAD    . oxymetazoline (AFRIN) 0.05 % nasal spray Place 1 spray into both nostrils at bedtime.    . pravastatin (PRAVACHOL) 40 MG tablet Take 1 tablet (40 mg total) by mouth daily at 6 PM. 30 tablet 2  . sulfaSALAzine (AZULFIDINE) 500 MG tablet Take 1,000 mg by mouth 2 (two) times daily.    . temazepam (RESTORIL) 30 MG capsule Take 30 mg by mouth at bedtime as needed for sleep.     . Vitamin D, Ergocalciferol, (DRISDOL) 50000 UNITS CAPS capsule Take 50,000 Units by mouth every 7 (seven) days. fridays    . metoprolol tartrate (LOPRESSOR) 25 MG tablet Take 1 tablet (25 mg total) by mouth 2 (two) times daily. 60 tablet 2   No facility-administered medications prior to visit.      Allergies:   Bee venom and Statins   Social History   Socioeconomic History  . Marital status:  Married    Spouse name: Not on file  . Number of children: Not on file  . Years of education: Not on file  . Highest education level: Not on file  Occupational History  . Occupation: Theme park manager  Social Needs  . Financial resource strain: Not on file  . Food insecurity:    Worry: Not on file    Inability: Not on file  . Transportation needs:    Medical: Not on file    Non-medical: Not on file  Tobacco Use  . Smoking status: Never Smoker  . Smokeless tobacco: Never Used  Substance and Sexual Activity  . Alcohol use: No  .  Drug use: No  . Sexual activity: Yes    Birth control/protection: None  Lifestyle  . Physical activity:    Days per week: Not on file    Minutes per session: Not on file  . Stress: Not on file  Relationships  . Social connections:    Talks on phone: Not on file    Gets together: Not on file    Attends religious service: Not on file    Active member of club or organization: Not on file    Attends meetings of clubs or organizations: Not on file    Relationship status: Not on file  Other Topics Concern  . Not on file  Social History Narrative  . Not on file     Family History:  The patient's family history includes CAD in his father; Colon cancer in his mother.   Review of Systems:   Please see the history of present illness.     General:  No chills, fever, night sweats or weight changes. Positive for fatigue.  Cardiovascular:  No chest pain, edema, orthopnea, palpitations, paroxysmal nocturnal dyspnea. Positive for dyspnea on exertion.  Dermatological: No rash, lesions/masses Respiratory: No cough, dyspnea Urologic: No hematuria, dysuria Abdominal:   No nausea, vomiting, diarrhea, bright red blood per rectum, melena, or hematemesis Neurologic:  No visual changes, wkns, changes in mental status. All other systems reviewed and are otherwise negative except as noted above.   Physical Exam:    VS:  BP 124/72   Pulse 62   Ht 5' 11.5" (1.816 m)   Wt  219 lb (99.3 kg)   SpO2 97%   BMI 30.12 kg/m    General: Well developed, well nourished Caucasian male appearing in no acute distress. Head: Normocephalic, atraumatic, sclera non-icteric, no xanthomas, nares are without discharge.  Neck: No carotid bruits. JVD not elevated.  Lungs: Respirations regular and unlabored, without wheezes or rales.  Heart: Regular rate and rhythm. No S3 or S4.  No murmur, no rubs, or gallops appreciated. Abdomen: Soft, non-tender, non-distended with normoactive bowel sounds. No hepatomegaly. No rebound/guarding. No obvious abdominal masses. Msk:  Strength and tone appear normal for age. No joint deformities or effusions. Extremities: No clubbing or cyanosis. No lower extremity edema.  Distal pedal pulses are 2+ bilaterally. Radial cath site stable without ecchymosis or evidence of a hematoma.  Neuro: Alert and oriented X 3. Moves all extremities spontaneously. No focal deficits noted. Psych:  Responds to questions appropriately with a normal affect. Skin: No rashes or lesions noted  Wt Readings from Last 3 Encounters:  01/24/18 219 lb (99.3 kg)  01/02/18 220 lb (99.8 kg)  12/27/17 222 lb (100.7 kg)     Studies/Labs Reviewed:   EKG:  EKG is not ordered today.   Recent Labs: 10/18/2017: Brain Natriuretic Peptide 10; TSH 1.38 01/02/2018: BUN 9; Creatinine, Ser 0.83; Hemoglobin 13.0; Platelets 287; Potassium 3.9; Sodium 140   Lipid Panel    Component Value Date/Time   CHOL 167 10/23/2017 0857   TRIG 87 10/23/2017 0857   HDL 46 10/23/2017 0857   CHOLHDL 3.6 10/23/2017 0857   VLDL 17 10/23/2017 0857   LDLCALC 104 (H) 10/23/2017 0857    Additional studies/ records that were reviewed today include:   Echocardiogram: 10/23/2017 Study Conclusions  - Left ventricle: The cavity size was normal. Wall thickness was   increased increased in a pattern of mild to moderate LVH.   Systolic function was normal. The estimated ejection fraction was  in the range  of 55% to 60%. Wall motion was normal; there were no   regional wall motion abnormalities. Doppler parameters are   consistent with abnormal left ventricular relaxation (grade 1   diastolic dysfunction). - Aortic valve: Mildly calcified annulus. Trileaflet; mildly   thickened leaflets. Valve area (VTI): 2.54 cm^2. Valve area   (Vmax): 2.26 cm^2. - Mitral valve: Mildly calcified annulus. Normal thickness leaflets   . - Technically adequate study.  Cardiac Catheterization: 01/02/2018  Previously placed Dist RCA stent (unknown type) is widely patent.  Previously placed Ost RCA to Prox RCA stent (unknown type) is widely patent.  Prox RCA to Mid RCA lesion is 60% stenosed.  Mid RCA lesion is 70% stenosed.  Acute Mrg lesion is 95% stenosed.  Post Atrio lesion is 60% stenosed.  Ost RPDA lesion is 70% stenosed.  Previously placed Prox Cx stent (unknown type) is widely patent.  Mid Cx to Dist Cx lesion is 60% stenosed.  Ost 3rd Mrg to 3rd Mrg lesion is 50% stenosed.  Ost 2nd Diag to 2nd Diag lesion is 70% stenosed.  Mid LAD lesion is 70% stenosed.  Dist LAD lesion is 30% stenosed.  Prox LAD lesion is 55% stenosed.  The left ventricular ejection fraction is greater than 65% by visual estimate.  There is hyperdynamic left ventricular systolic function.  LV end diastolic pressure is normal.   Hyperdynamic LV function with a "spade-like ventricle "with an EF of at least 75%.  Multivessel CAD with 55% proximal LAD stenosis after the first small first diagonal vessel, with diffuse 70% bifurcation stenoses involving the mid LAD and second diagonal vessel and 30% distal apical diffuse stenoses; patent left circumflex stent in the AV groove between the first small marginal branch and second large marginal vessel.  There is diffuse 50 to 60% stenoses in the distal AV groove circumflex and bifurcating OM 3 vessel; large dominant RCA with patent proximal stent, diffuse 60 to 70% mid  stenoses with 95% ostial stenosis and a small RV marginal branch and patent distal RCA stent proximal to the PDA takeoff with 60 to 70% stenoses at the origin of the PDA and continuation branch beyond the PDA vessel.  RECOMMENDATION: In light of the patient's diffuse multivessel disease, recommend an initial more aggressive medical therapy trial.  The patient has only been on low-dose amlodipine and losartan for blood pressure control.  Would add beta-blocker, nitrates, titrate amlodipine, and consider Ranexa if necessary.  In addition, recommend aggressive lipid-lowering therapy.  The patient has only been on low-dose pravastatin 20 mg and Zetia 10 mg.  Apparently there has been intolerability to Crestor and Zocor.  Would increase pravastatin or switch to atorvastatin but strongly consider PCSK9 inhibition and aim to dramatically lower LDL cholesterol and induce plaque regression.   Assessment:    1. Coronary artery disease involving native coronary artery of native heart with angina pectoris ([place])   2. Dyspnea on exertion   3. Essential hypertension   4. Mixed hyperlipidemia   5. Statin intolerance      Plan:   In order of problems listed above:  1. CAD/ Dyspnea on Exertion - the patient has known multivessel CAD and is s/p DESx3 to RCA and DES to mid-LCx in 05/2010. Due to his worsening dyspnea on exertion, he recently underwent a repeat catheterization which showed multivessel CAD with 55% proximal LAD stenosis, 70% mid LAD stenosis, 30% distal LAD stenosis, 70% mid RA CA, 95% acute marginal, 60% post Atrio stenosis,  50% Ost 3rd Mrg, and 70% 2nd Diag stenosis. With his diffuse CAD, aggressive medical therapy was recommended.  - He continues to experience dyspnea on exertion but has noticed some improvement with further titration of Amlodipine and the addition of Lopressor. -Will continue on ASA, Amlodipine, Imdur, and Lopressor. He is currently tolerating Zetia and Pravachol 40 mg daily  at this time. Will send a note to the lipid clinic as outlined below to further address possible initiation of PCSK-9 Inhibitor therapy.   2. HTN - BP is well-controlled at 124/72 during today's visit. - Continue Amlodipine 5 mg daily, Imdur 30 mg daily, Losartan 50 mg daily, and Lopressor. He had been taking Lopressor 25 mg once daily instead of BID and has experienced fatigue with this. I recommended he divide the dosing to 12.5 mg twice daily. If fatigue continues to be an issue, could consider switching to Toprol-XL.  3. HLD/Statin Intolerance - Recent labs in 12/2017 showed that his LDL had declined from 104 to 98 at that time. Still not at goal of < 70. Pravachol was recently increased to 40 mg daily approximately 3 weeks ago. Even with this recent adjustment, I am concerned that he will not reach goal LDL and with his multivessel CAD, he would benefit from additional lipid-lowering medications. Will send a note to the [facility] today for clarification to see if he would be able to start PCSK-9 inhibitor therapy soon or if we need to wait for an additional repeat FLP in 02/2018.   Medication Adjustments/Labs and Tests Ordered: Current medicines are reviewed at length with the patient today.  Concerns regarding medicines are outlined above.  Medication changes, Labs and Tests ordered today are listed in the Patient Instructions below. Patient Instructions  Medication Instructions:  Your physician recommends that you continue on your current medications as directed. Please refer to the Current Medication list given to you today.  Labwork: NONE   Testing/Procedures: NONE   Follow-Up: Your physician recommends that you schedule a follow-up appointment in: 3 Months   Any Other Special Instructions Will Be Listed Below (If Applicable).  If you need a refill on your cardiac medications before your next appointment, please call your pharmacy.  Thank you for choosing [facility]!    Signed, Erma Heritage, PA-C  01/24/2018 3:58 PM    [facility] [address] [place], [place] [address] Phone: [phone]

## 2018-01-24 ENCOUNTER — Encounter: Payer: Self-pay | Admitting: Student

## 2018-01-24 ENCOUNTER — Ambulatory Visit (INDEPENDENT_AMBULATORY_CARE_PROVIDER_SITE_OTHER): Payer: Medicare Other | Admitting: Student

## 2018-01-24 VITALS — BP 124/72 | HR 62 | Ht 71.5 in | Wt 219.0 lb

## 2018-01-24 DIAGNOSIS — R0609 Other forms of dyspnea: Secondary | ICD-10-CM | POA: Diagnosis not present

## 2018-01-24 DIAGNOSIS — I10 Essential (primary) hypertension: Secondary | ICD-10-CM | POA: Diagnosis not present

## 2018-01-24 DIAGNOSIS — Z789 Other specified health status: Secondary | ICD-10-CM

## 2018-01-24 DIAGNOSIS — E782 Mixed hyperlipidemia: Secondary | ICD-10-CM

## 2018-01-24 DIAGNOSIS — I200 Unstable angina: Secondary | ICD-10-CM

## 2018-01-24 DIAGNOSIS — I25119 Atherosclerotic heart disease of native coronary artery with unspecified angina pectoris: Secondary | ICD-10-CM | POA: Diagnosis not present

## 2018-01-24 NOTE — Patient Instructions (Signed)
Medication Instructions:  Your physician recommends that you continue on your current medications as directed. Please refer to the Current Medication list given to you today.   Labwork: NONE   Testing/Procedures: NONE   Follow-Up: Your physician recommends that you schedule a follow-up appointment in: 3 Months    Any Other Special Instructions Will Be Listed Below (If Applicable).     If you need a refill on your cardiac medications before your next appointment, please call your pharmacy.  Thank you for choosing [facility]!

## 2018-01-25 DIAGNOSIS — Z6830 Body mass index (BMI) 30.0-30.9, adult: Secondary | ICD-10-CM | POA: Diagnosis not present

## 2018-01-25 DIAGNOSIS — L409 Psoriasis, unspecified: Secondary | ICD-10-CM | POA: Diagnosis not present

## 2018-01-25 DIAGNOSIS — Z713 Dietary counseling and surveillance: Secondary | ICD-10-CM | POA: Diagnosis not present

## 2018-01-25 DIAGNOSIS — I10 Essential (primary) hypertension: Secondary | ICD-10-CM | POA: Diagnosis not present

## 2018-01-25 DIAGNOSIS — Z299 Encounter for prophylactic measures, unspecified: Secondary | ICD-10-CM | POA: Diagnosis not present

## 2018-01-30 DIAGNOSIS — I25811 Atherosclerosis of native coronary artery of transplanted heart without angina pectoris: Secondary | ICD-10-CM | POA: Diagnosis not present

## 2018-01-30 DIAGNOSIS — G4733 Obstructive sleep apnea (adult) (pediatric): Secondary | ICD-10-CM | POA: Diagnosis not present

## 2018-01-30 DIAGNOSIS — J309 Allergic rhinitis, unspecified: Secondary | ICD-10-CM | POA: Diagnosis not present

## 2018-01-30 DIAGNOSIS — R9389 Abnormal findings on diagnostic imaging of other specified body structures: Secondary | ICD-10-CM | POA: Diagnosis not present

## 2018-01-30 DIAGNOSIS — D869 Sarcoidosis, unspecified: Secondary | ICD-10-CM | POA: Diagnosis not present

## 2018-01-30 DIAGNOSIS — J45909 Unspecified asthma, uncomplicated: Secondary | ICD-10-CM | POA: Diagnosis not present

## 2018-01-30 DIAGNOSIS — K5150 Left sided colitis without complications: Secondary | ICD-10-CM | POA: Diagnosis not present

## 2018-01-30 DIAGNOSIS — J449 Chronic obstructive pulmonary disease, unspecified: Secondary | ICD-10-CM | POA: Diagnosis not present

## 2018-02-01 ENCOUNTER — Telehealth: Payer: Self-pay | Admitting: *Deleted

## 2018-02-01 DIAGNOSIS — E782 Mixed hyperlipidemia: Secondary | ICD-10-CM

## 2018-02-01 NOTE — Telephone Encounter (Signed)
-----   Message from Erma Heritage, [place] sent at 01/29/2018  7:01 PM EDT ----- Regarding: FW: [place],   This patient will need a repeat FLP and LFT's in 4 weeks and wishes to have them drawn in [place]. I imagine he will need printed orders at that time to take to [facility]. I already called and reviewed with him that his [facility] appointment has been cancelled and we will await his lab results.   Thanks for your help!  Best,  [place]   ----- Message ----- From: Erskine Emery, [facility] Sent: 01/25/2018   4:58 PM To: Erma Heritage, PA-C Subject: RE: [place],  Yes we will need his LDL after dose increase. Would recommend checking and then he can be scheduled with the lipid clinic if needed for discussion/initiation of PCSK9i therapy after recheck. I will cancel his visit with [place]. Please let me know if you need me to order labs. Also once results available be sure to send them my way if he needs PCSK9i therapy.  Thanks for he heads up! Georgina Peer    ----- Message ----- From: Erma Heritage, PA-C Sent: 01/24/2018   4:00 PM To: Erskine Emery, [facility] Subject: Lipid Clinic                                   Hi Georgina Peer,   I hope you are doing well! You previously evaluated this patient in the lipid clinic a few months ago and he was continued on Pravachol at that time with Zetia being added to his medication regimen. In the interim, he had a repeat catheterization which showed multivessel CAD and the addition of PCSK9 inhibitor therapy was recommended.  Dr. Domenic Polite recently titrated his Pravachol from 20 mg daily to 40 mg daily 2 weeks ago. I wanted to double check with you to see if he would need to stay on this for 6 more weeks to see how his LDL responds or if he would be a candidate for PCSK9 therapy at this time?  He has a scheduled visit with you all on 02/05/2018 but lives in [place], therefore he wanted me to check to see  if he just needs repeat labs and these can be obtained nearby or if being started on injections at that time, then he would keep the appointment?  Thank you for your help!  [place],  [place]

## 2018-02-05 ENCOUNTER — Ambulatory Visit: Payer: Medicare Other

## 2018-02-19 DIAGNOSIS — K5900 Constipation, unspecified: Secondary | ICD-10-CM | POA: Diagnosis not present

## 2018-02-19 DIAGNOSIS — L290 Pruritus ani: Secondary | ICD-10-CM | POA: Diagnosis not present

## 2018-02-19 DIAGNOSIS — K5150 Left sided colitis without complications: Secondary | ICD-10-CM | POA: Diagnosis not present

## 2018-02-20 DIAGNOSIS — K5150 Left sided colitis without complications: Secondary | ICD-10-CM | POA: Diagnosis not present

## 2018-02-20 DIAGNOSIS — E782 Mixed hyperlipidemia: Secondary | ICD-10-CM | POA: Diagnosis not present

## 2018-02-26 ENCOUNTER — Encounter: Payer: Self-pay | Admitting: *Deleted

## 2018-02-26 ENCOUNTER — Telehealth: Payer: Self-pay | Admitting: Cardiology

## 2018-02-26 NOTE — Telephone Encounter (Signed)
Patient called requesting recent labs done at [facility]. Patient is requesting a copy of labs ,

## 2018-02-26 NOTE — Telephone Encounter (Signed)
Call returned to patient.  Stated that he had labs done at [facility] last Wednesday or Thursday.  Does not look like we have received yet as not available in Epic.  Will send request to [facility] for these labs.  Patient informed.

## 2018-02-27 NOTE — Telephone Encounter (Signed)
Labs received & scanned into Epic.  Will fwd message for provider to review labs.

## 2018-02-28 ENCOUNTER — Telehealth: Payer: Self-pay

## 2018-02-28 NOTE — Telephone Encounter (Signed)
Informed wife that he would need to come by office to sign release if he would like copy of the labs.  She verbalized understanding.

## 2018-02-28 NOTE — Telephone Encounter (Signed)
Patient contacted office stating he is very weak, exhausted and just feels lifeless. 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 Patient reports he has been very forgetful as well and his mind is not clear. Patient states this has been going on for about 2 weeks. He reports some shortness of breath. Patient denies any chest pain. Patient is concerned this could be due to medications. Will forward to provider for recommendations.

## 2018-02-28 NOTE — Telephone Encounter (Signed)
Notes recorded by Laurine Blazer, LPN on 5/83/0940 at 7:68 AM EDT Wife Vaughan Basta) notified. Copy to pmd. ------  Notes recorded by Satira Sark, MD on 02/28/2018 at 8:37 AM EDT Results reviewed. Renal function and hemoglobin normal. LFTs normal. Sed rate and CRP within normal range. Lipid numbers look better overall, total cholesterol 128 and LDL down to 70. Would continue with current plan. A copy of this test should be forwarded to [name], MD.

## 2018-02-28 NOTE — Telephone Encounter (Signed)
I looked at the lab results and forwarded recommendations under result review.

## 2018-02-28 NOTE — Telephone Encounter (Signed)
Symptoms that he is describing could be due to many things, not all of which would necessarily be cardiac in etiology.  If he is noticed a definite change, I would have him follow-up with his PCP to look into things further.  If there are questions about whether his cardiac regimen needs to be modified, we can certainly assist with this.  Only recent change was an increase in Pravachol.

## 2018-02-28 NOTE — Telephone Encounter (Signed)
Patient notified and verbalized understanding. 

## 2018-03-01 DIAGNOSIS — H43813 Vitreous degeneration, bilateral: Secondary | ICD-10-CM | POA: Diagnosis not present

## 2018-03-01 DIAGNOSIS — H35373 Puckering of macula, bilateral: Secondary | ICD-10-CM | POA: Diagnosis not present

## 2018-03-01 DIAGNOSIS — H59032 Cystoid macular edema following cataract surgery, left eye: Secondary | ICD-10-CM | POA: Diagnosis not present

## 2018-03-01 DIAGNOSIS — H3581 Retinal edema: Secondary | ICD-10-CM | POA: Diagnosis not present

## 2018-03-20 DIAGNOSIS — I10 Essential (primary) hypertension: Secondary | ICD-10-CM | POA: Diagnosis not present

## 2018-03-20 DIAGNOSIS — I2510 Atherosclerotic heart disease of native coronary artery without angina pectoris: Secondary | ICD-10-CM | POA: Diagnosis not present

## 2018-03-26 DIAGNOSIS — J479 Bronchiectasis, uncomplicated: Secondary | ICD-10-CM | POA: Diagnosis not present

## 2018-03-26 DIAGNOSIS — Z299 Encounter for prophylactic measures, unspecified: Secondary | ICD-10-CM | POA: Diagnosis not present

## 2018-03-26 DIAGNOSIS — Z6831 Body mass index (BMI) 31.0-31.9, adult: Secondary | ICD-10-CM | POA: Diagnosis not present

## 2018-03-26 DIAGNOSIS — G4700 Insomnia, unspecified: Secondary | ICD-10-CM | POA: Diagnosis not present

## 2018-03-26 DIAGNOSIS — I10 Essential (primary) hypertension: Secondary | ICD-10-CM | POA: Diagnosis not present

## 2018-03-26 DIAGNOSIS — L409 Psoriasis, unspecified: Secondary | ICD-10-CM | POA: Diagnosis not present

## 2018-03-26 DIAGNOSIS — I2510 Atherosclerotic heart disease of native coronary artery without angina pectoris: Secondary | ICD-10-CM | POA: Diagnosis not present

## 2018-04-02 DIAGNOSIS — H35373 Puckering of macula, bilateral: Secondary | ICD-10-CM | POA: Diagnosis not present

## 2018-04-02 DIAGNOSIS — H35422 Microcystoid degeneration of retina, left eye: Secondary | ICD-10-CM | POA: Diagnosis not present

## 2018-04-02 DIAGNOSIS — H35411 Lattice degeneration of retina, right eye: Secondary | ICD-10-CM | POA: Diagnosis not present

## 2018-04-02 DIAGNOSIS — H43813 Vitreous degeneration, bilateral: Secondary | ICD-10-CM | POA: Diagnosis not present

## 2018-04-17 ENCOUNTER — Other Ambulatory Visit: Payer: Self-pay | Admitting: Cardiology

## 2018-04-30 DIAGNOSIS — L281 Prurigo nodularis: Secondary | ICD-10-CM | POA: Diagnosis not present

## 2018-04-30 DIAGNOSIS — Z79899 Other long term (current) drug therapy: Secondary | ICD-10-CM | POA: Diagnosis not present

## 2018-04-30 DIAGNOSIS — L299 Pruritus, unspecified: Secondary | ICD-10-CM | POA: Diagnosis not present

## 2018-04-30 NOTE — Progress Notes (Signed)
Cardiology Office Note  Date: 05/01/2018   ID: Richard Webb, DOB [birth year], MRN [id number]  PCP: [name], MD  Primary Cardiologist: Rozann Lesches, MD   Chief Complaint  Patient presents with  . Coronary Artery Disease    History of Present Illness: Richard Webb is a 72 y.o. male last seen by Ms. Strader PA-C in June.  He presents for a routine visit.  Overall doing reasonably well in terms of angina control on current regimen. At the last visit Lopressor was reduced to 12.5 mg BID.  He still has episodes where he feels weak with lack of energy, wobbliness in his legs.  He cannot identify any specific trigger for this.  It does not seem to be associated with any medications that he is taking, but he is worried that it could be a side effect.  He brought up the possibility of pravastatin since the dose has been increased to 40 mg daily.  Fortunately, his lipid numbers did look better however in July.  Past Medical History:  Diagnosis Date  . Arthritis   . Asthma   . Coronary atherosclerosis of native coronary artery    a. DES x 3 RCA and DES mCx 10/11 - [place] - with residual 50-70% stenosis in the distal Cx in AV groove, 70% distal LAD beyond apex, 70% small D1. b. 12/2017: similar results with multivessel CAD. Patent stents. Medical management recommended.   Marland Kitchen GERD (gastroesophageal reflux disease)   . History of kidney stones   . Mixed hyperlipidemia   . Obstructive sleep apnea   . Pulmonary hypertension (HCC)    PASP 50 mmHg  . Restless leg syndrome   . Sarcoidosis   . Statin intolerance   . Ulcerative colitis [facility])     Past Surgical History:  Procedure Laterality Date  . Ankle fracture repair Left 1985  . CARDIAC CATHETERIZATION     stents x4  . CHOLECYSTECTOMY  1970  . CYSTOSCOPY WITH INSERTION OF UROLIFT N/A 08/27/2017   Procedure: CYSTOSCOPY WITH INSERTION OF UROLIFT;  Surgeon: Cleon Gustin, MD;  Location: [facility];  Service: Urology;   Laterality: N/A;  . LAPAROSCOPIC APPENDECTOMY  2013  . LEFT HEART CATH AND CORONARY ANGIOGRAPHY N/A 01/02/2018   Procedure: LEFT HEART CATH AND CORONARY ANGIOGRAPHY;  Surgeon: Troy Sine, MD;  Location: [facility];  Service: Cardiovascular;  Laterality: N/A;  . Right total knee replacement Bilateral 2005    Current Outpatient Medications  Medication Sig Dispense Refill  . aspirin 81 MG tablet Take 81 mg by mouth daily.    Marland Kitchen BREO ELLIPTA 200-25 MCG/INH AEPB Inhale 1 Inhaler into the lungs daily.  3  . carbidopa-levodopa (SINEMET IR) 10-100 MG per tablet Take 1 tablet by mouth at bedtime as needed (restless legs).     . cetirizine (ZYRTEC) 10 MG tablet Take 10 mg by mouth daily.    . Cyanocobalamin (B-12) 1000 MCG/ML KIT Inject 1,000 mcg as directed every 30 (thirty) days.    Marland Kitchen dicyclomine (BENTYL) 10 MG capsule Take 10 mg by mouth 2 (two) times daily as needed for spasms.     Marland Kitchen EPINEPHrine (EPIPEN JR) 0.15 MG/0.3ML injection Inject 0.15 mg into the muscle as needed for anaphylaxis.     Marland Kitchen gabapentin (NEURONTIN) 300 MG capsule Take 300 mg by mouth 4 (four) times daily.    Marland Kitchen HYDROcodone-acetaminophen (NORCO/VICODIN) 5-325 MG tablet Take 1 tablet by mouth every 8 (eight) hours as needed for moderate pain.     Marland Kitchen  hydrocortisone cream 1 % Apply 1 application topically 2 (two) times daily as needed for itching (psoriasis).    . isosorbide mononitrate (IMDUR) 30 MG 24 hr tablet TAKE 1 TABLET BY MOUTH DAILY 30 tablet 3  . losartan (COZAAR) 50 MG tablet Take 50 mg by mouth daily.    . meloxicam (MOBIC) 7.5 MG tablet Take 7.5 mg by mouth daily.     . metoprolol tartrate (LOPRESSOR) 25 MG tablet Take 12.5 mg by mouth 2 (two) times daily.    . montelukast (SINGULAIR) 10 MG tablet Take 10 mg by mouth at bedtime.    . nitroGLYCERIN (NITROSTAT) 0.4 MG SL tablet Place 1 tablet (0.4 mg total) under the tongue every 5 (five) minutes as needed for chest pain. 90 tablet 3  . NON FORMULARY CPAP UAD      . oxymetazoline (AFRIN) 0.05 % nasal spray Place 1 spray into both nostrils at bedtime.    . pravastatin (PRAVACHOL) 40 MG tablet Take 1 tablet (40 mg total) by mouth daily at 6 PM. 05/01/18 HOLD FOR 3-4 WEEKS-CALL OFFICE WITH UPDATE AFTERWARDS 30 tablet 2  . sulfaSALAzine (AZULFIDINE) 500 MG tablet Take 1,000 mg by mouth 2 (two) times daily.    . temazepam (RESTORIL) 30 MG capsule Take 30 mg by mouth at bedtime as needed for sleep.     . Vitamin D, Ergocalciferol, (DRISDOL) 50000 UNITS CAPS capsule Take 50,000 Units by mouth every 7 (seven) days. fridays    . amLODipine (NORVASC) 5 MG tablet Take 1 tablet (5 mg total) by mouth daily. 90 tablet 3  . ezetimibe (ZETIA) 10 MG tablet Take 1 tablet (10 mg total) by mouth daily. 90 tablet 3   No current facility-administered medications for this visit.    Allergies:  Bee venom and Statins   Social History: The patient  reports that he has never smoked. He has never used smokeless tobacco. He reports that he does not drink alcohol or use drugs.   ROS:  Please see the history of present illness. Otherwise, complete review of systems is positive for none.  All other systems are reviewed and negative.   Physical Exam: VS:  BP 130/80   Pulse 83   Ht 5' 11"  (1.803 m)   Wt 226 lb (102.5 kg)   SpO2 98%   BMI 31.52 kg/m , BMI Body mass index is 31.52 kg/m.  Wt Readings from Last 3 Encounters:  05/01/18 226 lb (102.5 kg)  01/24/18 219 lb (99.3 kg)  01/02/18 220 lb (99.8 kg)    General: Patient appears comfortable at rest. HEENT: Conjunctiva and lids normal, oropharynx clear. Neck: Supple, no elevated JVP or carotid bruits, no thyromegaly. Lungs: Clear to auscultation, nonlabored breathing at rest. Cardiac: Regular rate and rhythm, no S3, soft systolic murmur. Abdomen: Soft, nontender, bowel sounds present. Extremities: No pitting edema, distal pulses 2+.  ECG: I personally reviewed the tracing from 10/10/2017 which showed normal sinus  rhythm.  Recent Labwork: 10/18/2017: Brain Natriuretic Peptide 10; TSH 1.38 01/02/2018: BUN 9; Creatinine, Ser 0.83; Hemoglobin 13.0; Platelets 287; Potassium 3.9; Sodium 140     Component Value Date/Time   CHOL 167 10/23/2017 0857   TRIG 87 10/23/2017 0857   HDL 46 10/23/2017 0857   CHOLHDL 3.6 10/23/2017 0857   VLDL 17 10/23/2017 0857   LDLCALC 104 (H) 10/23/2017 0857  July 2019: BUN 16, creatinine 0.98, potassium 4.4, AST 21.6, ALT 19, hemoglobin 12.8, platelets 220, ESR 15, cholesterol 128, triglycerides 71, HDL 44,  LDL 70, CRP 2.82   Other Studies Reviewed Today:  Echocardiogram 10/23/2017: Study Conclusions  - Left ventricle: The cavity size was normal. Wall thickness was increased increased in a pattern of mild to moderate LVH. Systolic function was normal. The estimated ejection fraction was in the range of 55% to 60%. Wall motion was normal; there were no regional wall motion abnormalities. Doppler parameters are consistent with abnormal left ventricular relaxation (grade 1 diastolic dysfunction). - Aortic valve: Mildly calcified annulus. Trileaflet; mildly thickened leaflets. Valve area (VTI): 2.54 cm^2. Valve area (Vmax): 2.26 cm^2. - Mitral valve: Mildly calcified annulus. Normal thickness leaflets. - Technically adequate study.  Cardiac catheterization 01/02/2018:  Previously placed Dist RCA stent (unknown type) is widely patent.  Previously placed Ost RCA to Prox RCA stent (unknown type) is widely patent.  Prox RCA to Mid RCA lesion is 60% stenosed.  Mid RCA lesion is 70% stenosed.  Acute Mrg lesion is 95% stenosed.  Post Atrio lesion is 60% stenosed.  Ost RPDA lesion is 70% stenosed.  Previously placed Prox Cx stent (unknown type) is widely patent.  Mid Cx to Dist Cx lesion is 60% stenosed.  Ost 3rd Mrg to 3rd Mrg lesion is 50% stenosed.  Ost 2nd Diag to 2nd Diag lesion is 70% stenosed.  Mid LAD lesion is 70% stenosed.  Dist LAD  lesion is 30% stenosed.  Prox LAD lesion is 55% stenosed.  The left ventricular ejection fraction is greater than 65% by visual estimate.  There is hyperdynamic left ventricular systolic function.  LV end diastolic pressure is normal.   Hyperdynamic LV function with a "spade-like ventricle "with an EF of at least 75%.  Multivessel CAD with 55% proximal LAD stenosis after the first small first diagonal vessel, with diffuse 70% bifurcation stenoses involving the mid LAD and second diagonal vessel and 30% distal apical diffuse stenoses; patent left circumflex stent in the AV groove between the first small marginal branch and second large marginal vessel.  There is diffuse 50 to 60% stenoses in the distal AV groove circumflex and bifurcating OM 3 vessel; large dominant RCA with patent proximal stent, diffuse 60 to 70% mid stenoses with 95% ostial stenosis and a small RV marginal branch and patent distal RCA stent proximal to the PDA takeoff with 60 to 70% stenoses at the origin of the PDA and continuation branch beyond the PDA vessel.  Assessment and Plan:  1.  Multivessel CAD status post DES x3 to the RCA and DES to the obtuse marginal.  Most recent cardiac catheterization as outlined above with plan for medical therapy at this point.  He has reasonable control of angina at this time.  No changes made to current antianginal regimen.  2.  Episodic weakness, overall and also in his legs to some degree.  I asked him to check blood pressure and heart rate during the spells, but it does not seem to be associated with hypotension or bradycardia.  He has had statin intolerance before, I will have him hold Pravachol temporarily to see if this might be contributing.  3.  Mixed hyperlipidemia.  On Zetia and Pravachol 40 mg daily, his LDL has come down to 70.  Depending on the above, still need may need referral to lipid clinic to discuss PCSK9 inhibitor.  4.  Essential hypertension, blood pressure is  adequately controlled today.  Current medicines were reviewed with the patient today.  Disposition: Follow-up in 6 months, sooner if needed.  Signed, Satira Sark, MD, [facility]  05/01/2018 1:29 PM    [facility] at [place], [place], [place] [address] Phone: [phone]; Fax: [phone]

## 2018-05-01 ENCOUNTER — Ambulatory Visit (INDEPENDENT_AMBULATORY_CARE_PROVIDER_SITE_OTHER): Payer: Medicare Other | Admitting: Cardiology

## 2018-05-01 ENCOUNTER — Encounter: Payer: Self-pay | Admitting: Cardiology

## 2018-05-01 VITALS — BP 130/80 | HR 83 | Ht 71.0 in | Wt 226.0 lb

## 2018-05-01 DIAGNOSIS — Z789 Other specified health status: Secondary | ICD-10-CM | POA: Diagnosis not present

## 2018-05-01 DIAGNOSIS — I25119 Atherosclerotic heart disease of native coronary artery with unspecified angina pectoris: Secondary | ICD-10-CM

## 2018-05-01 DIAGNOSIS — E782 Mixed hyperlipidemia: Secondary | ICD-10-CM | POA: Diagnosis not present

## 2018-05-01 DIAGNOSIS — I10 Essential (primary) hypertension: Secondary | ICD-10-CM | POA: Diagnosis not present

## 2018-05-01 DIAGNOSIS — I200 Unstable angina: Secondary | ICD-10-CM

## 2018-05-01 MED ORDER — PRAVASTATIN SODIUM 40 MG PO TABS: 40.0000 mg | ORAL_TABLET | Freq: Every day | ORAL | 2 refills | Status: DC

## 2018-05-01 NOTE — Patient Instructions (Addendum)
Medication Instructions:   Your physician has recommended you make the following change in your medication:   Please hold your pravastatin for 3-4 weeks to see if your symptoms improve. Please call our office afterwards with an update on your symptoms.  Continue all other medications the same.  Labwork:  NONE  Testing/Procedures:  NONE  Follow-Up:  Your physician recommends that you schedule a follow-up appointment in: 6 months. You will receive a reminder letter in the mail in about 4 months reminding you to call and schedule your appointment. If you don't receive this letter, please contact our office.  Any Other Special Instructions Will Be Listed Below (If Applicable).  If you need a refill on your cardiac medications before your next appointment, please call your pharmacy.

## 2018-05-06 DIAGNOSIS — M5416 Radiculopathy, lumbar region: Secondary | ICD-10-CM | POA: Diagnosis not present

## 2018-05-08 DIAGNOSIS — Z6832 Body mass index (BMI) 32.0-32.9, adult: Secondary | ICD-10-CM | POA: Diagnosis not present

## 2018-05-08 DIAGNOSIS — G4733 Obstructive sleep apnea (adult) (pediatric): Secondary | ICD-10-CM | POA: Diagnosis not present

## 2018-05-08 DIAGNOSIS — I2510 Atherosclerotic heart disease of native coronary artery without angina pectoris: Secondary | ICD-10-CM | POA: Diagnosis not present

## 2018-05-08 DIAGNOSIS — Z299 Encounter for prophylactic measures, unspecified: Secondary | ICD-10-CM | POA: Diagnosis not present

## 2018-05-08 DIAGNOSIS — E7800 Pure hypercholesterolemia, unspecified: Secondary | ICD-10-CM | POA: Diagnosis not present

## 2018-05-08 DIAGNOSIS — M791 Myalgia, unspecified site: Secondary | ICD-10-CM | POA: Diagnosis not present

## 2018-05-08 DIAGNOSIS — R5383 Other fatigue: Secondary | ICD-10-CM | POA: Diagnosis not present

## 2018-05-09 DIAGNOSIS — D649 Anemia, unspecified: Secondary | ICD-10-CM | POA: Diagnosis not present

## 2018-05-09 DIAGNOSIS — E7800 Pure hypercholesterolemia, unspecified: Secondary | ICD-10-CM | POA: Diagnosis not present

## 2018-05-09 DIAGNOSIS — R5383 Other fatigue: Secondary | ICD-10-CM | POA: Diagnosis not present

## 2018-05-09 DIAGNOSIS — I2510 Atherosclerotic heart disease of native coronary artery without angina pectoris: Secondary | ICD-10-CM | POA: Diagnosis not present

## 2018-05-09 DIAGNOSIS — I10 Essential (primary) hypertension: Secondary | ICD-10-CM | POA: Diagnosis not present

## 2018-05-28 DIAGNOSIS — Z23 Encounter for immunization: Secondary | ICD-10-CM | POA: Diagnosis not present

## 2018-05-28 DIAGNOSIS — E7800 Pure hypercholesterolemia, unspecified: Secondary | ICD-10-CM | POA: Diagnosis not present

## 2018-05-28 DIAGNOSIS — L409 Psoriasis, unspecified: Secondary | ICD-10-CM | POA: Diagnosis not present

## 2018-05-28 DIAGNOSIS — I10 Essential (primary) hypertension: Secondary | ICD-10-CM | POA: Diagnosis not present

## 2018-05-28 DIAGNOSIS — Z299 Encounter for prophylactic measures, unspecified: Secondary | ICD-10-CM | POA: Diagnosis not present

## 2018-05-28 DIAGNOSIS — Z6831 Body mass index (BMI) 31.0-31.9, adult: Secondary | ICD-10-CM | POA: Diagnosis not present

## 2018-05-30 DIAGNOSIS — L281 Prurigo nodularis: Secondary | ICD-10-CM | POA: Diagnosis not present

## 2018-05-30 DIAGNOSIS — L400 Psoriasis vulgaris: Secondary | ICD-10-CM | POA: Diagnosis not present

## 2018-05-30 DIAGNOSIS — Z79899 Other long term (current) drug therapy: Secondary | ICD-10-CM | POA: Diagnosis not present

## 2018-05-30 DIAGNOSIS — L4050 Arthropathic psoriasis, unspecified: Secondary | ICD-10-CM | POA: Diagnosis not present

## 2018-05-30 DIAGNOSIS — L299 Pruritus, unspecified: Secondary | ICD-10-CM | POA: Diagnosis not present

## 2018-06-05 DIAGNOSIS — L409 Psoriasis, unspecified: Secondary | ICD-10-CM | POA: Diagnosis not present

## 2018-06-05 DIAGNOSIS — Z299 Encounter for prophylactic measures, unspecified: Secondary | ICD-10-CM | POA: Diagnosis not present

## 2018-06-05 DIAGNOSIS — I10 Essential (primary) hypertension: Secondary | ICD-10-CM | POA: Diagnosis not present

## 2018-06-05 DIAGNOSIS — M1812 Unilateral primary osteoarthritis of first carpometacarpal joint, left hand: Secondary | ICD-10-CM | POA: Diagnosis not present

## 2018-06-05 DIAGNOSIS — M659 Synovitis and tenosynovitis, unspecified: Secondary | ICD-10-CM | POA: Diagnosis not present

## 2018-06-05 DIAGNOSIS — M436 Torticollis: Secondary | ICD-10-CM | POA: Diagnosis not present

## 2018-06-05 DIAGNOSIS — M19042 Primary osteoarthritis, left hand: Secondary | ICD-10-CM | POA: Diagnosis not present

## 2018-06-05 DIAGNOSIS — M180 Bilateral primary osteoarthritis of first carpometacarpal joints: Secondary | ICD-10-CM | POA: Diagnosis not present

## 2018-06-05 DIAGNOSIS — Z6830 Body mass index (BMI) 30.0-30.9, adult: Secondary | ICD-10-CM | POA: Diagnosis not present

## 2018-06-05 DIAGNOSIS — M25512 Pain in left shoulder: Secondary | ICD-10-CM | POA: Diagnosis not present

## 2018-06-05 DIAGNOSIS — M79642 Pain in left hand: Secondary | ICD-10-CM | POA: Diagnosis not present

## 2018-06-05 DIAGNOSIS — M25519 Pain in unspecified shoulder: Secondary | ICD-10-CM | POA: Diagnosis not present

## 2018-06-05 DIAGNOSIS — M11241 Other chondrocalcinosis, right hand: Secondary | ICD-10-CM | POA: Diagnosis not present

## 2018-06-05 DIAGNOSIS — M1811 Unilateral primary osteoarthritis of first carpometacarpal joint, right hand: Secondary | ICD-10-CM | POA: Diagnosis not present

## 2018-06-17 DIAGNOSIS — M5410 Radiculopathy, site unspecified: Secondary | ICD-10-CM | POA: Diagnosis not present

## 2018-06-17 DIAGNOSIS — M545 Low back pain: Secondary | ICD-10-CM | POA: Diagnosis not present

## 2018-06-17 DIAGNOSIS — M5116 Intervertebral disc disorders with radiculopathy, lumbar region: Secondary | ICD-10-CM | POA: Diagnosis not present

## 2018-06-28 DIAGNOSIS — Z1331 Encounter for screening for depression: Secondary | ICD-10-CM | POA: Diagnosis not present

## 2018-06-28 DIAGNOSIS — E7800 Pure hypercholesterolemia, unspecified: Secondary | ICD-10-CM | POA: Diagnosis not present

## 2018-06-28 DIAGNOSIS — I10 Essential (primary) hypertension: Secondary | ICD-10-CM | POA: Diagnosis not present

## 2018-06-28 DIAGNOSIS — Z1211 Encounter for screening for malignant neoplasm of colon: Secondary | ICD-10-CM | POA: Diagnosis not present

## 2018-06-28 DIAGNOSIS — J45909 Unspecified asthma, uncomplicated: Secondary | ICD-10-CM | POA: Diagnosis not present

## 2018-06-28 DIAGNOSIS — Z299 Encounter for prophylactic measures, unspecified: Secondary | ICD-10-CM | POA: Diagnosis not present

## 2018-06-28 DIAGNOSIS — Z7189 Other specified counseling: Secondary | ICD-10-CM | POA: Diagnosis not present

## 2018-06-28 DIAGNOSIS — Z6831 Body mass index (BMI) 31.0-31.9, adult: Secondary | ICD-10-CM | POA: Diagnosis not present

## 2018-06-28 DIAGNOSIS — Z1339 Encounter for screening examination for other mental health and behavioral disorders: Secondary | ICD-10-CM | POA: Diagnosis not present

## 2018-06-28 DIAGNOSIS — Z0000 Encounter for general adult medical examination without abnormal findings: Secondary | ICD-10-CM | POA: Diagnosis not present

## 2018-07-11 DIAGNOSIS — L400 Psoriasis vulgaris: Secondary | ICD-10-CM | POA: Diagnosis not present

## 2018-07-11 DIAGNOSIS — Z79899 Other long term (current) drug therapy: Secondary | ICD-10-CM | POA: Diagnosis not present

## 2018-07-11 DIAGNOSIS — B029 Zoster without complications: Secondary | ICD-10-CM | POA: Diagnosis not present

## 2018-07-11 DIAGNOSIS — L4050 Arthropathic psoriasis, unspecified: Secondary | ICD-10-CM | POA: Diagnosis not present

## 2018-07-26 DIAGNOSIS — Z6832 Body mass index (BMI) 32.0-32.9, adult: Secondary | ICD-10-CM | POA: Diagnosis not present

## 2018-07-26 DIAGNOSIS — I10 Essential (primary) hypertension: Secondary | ICD-10-CM | POA: Diagnosis not present

## 2018-07-26 DIAGNOSIS — I2510 Atherosclerotic heart disease of native coronary artery without angina pectoris: Secondary | ICD-10-CM | POA: Diagnosis not present

## 2018-07-26 DIAGNOSIS — L0390 Cellulitis, unspecified: Secondary | ICD-10-CM | POA: Diagnosis not present

## 2018-07-26 DIAGNOSIS — Z299 Encounter for prophylactic measures, unspecified: Secondary | ICD-10-CM | POA: Diagnosis not present

## 2018-07-29 DIAGNOSIS — M65312 Trigger thumb, left thumb: Secondary | ICD-10-CM | POA: Diagnosis not present

## 2018-07-29 DIAGNOSIS — M65322 Trigger finger, left index finger: Secondary | ICD-10-CM | POA: Diagnosis not present

## 2018-08-05 DIAGNOSIS — K5150 Left sided colitis without complications: Secondary | ICD-10-CM | POA: Diagnosis not present

## 2018-08-05 DIAGNOSIS — R0602 Shortness of breath: Secondary | ICD-10-CM | POA: Diagnosis not present

## 2018-08-05 DIAGNOSIS — R5381 Other malaise: Secondary | ICD-10-CM | POA: Diagnosis not present

## 2018-08-05 DIAGNOSIS — D869 Sarcoidosis, unspecified: Secondary | ICD-10-CM | POA: Diagnosis not present

## 2018-08-09 DIAGNOSIS — M5416 Radiculopathy, lumbar region: Secondary | ICD-10-CM | POA: Diagnosis not present

## 2018-08-22 ENCOUNTER — Other Ambulatory Visit: Payer: Self-pay | Admitting: Cardiology

## 2018-08-29 DIAGNOSIS — L400 Psoriasis vulgaris: Secondary | ICD-10-CM | POA: Diagnosis not present

## 2018-08-29 DIAGNOSIS — L299 Pruritus, unspecified: Secondary | ICD-10-CM | POA: Diagnosis not present

## 2018-08-29 DIAGNOSIS — L281 Prurigo nodularis: Secondary | ICD-10-CM | POA: Diagnosis not present

## 2018-08-29 DIAGNOSIS — Z79899 Other long term (current) drug therapy: Secondary | ICD-10-CM | POA: Diagnosis not present

## 2018-08-29 DIAGNOSIS — L4050 Arthropathic psoriasis, unspecified: Secondary | ICD-10-CM | POA: Diagnosis not present

## 2018-09-06 DIAGNOSIS — M65352 Trigger finger, left little finger: Secondary | ICD-10-CM | POA: Diagnosis not present

## 2018-09-06 DIAGNOSIS — M65322 Trigger finger, left index finger: Secondary | ICD-10-CM | POA: Diagnosis not present

## 2018-09-06 DIAGNOSIS — M65312 Trigger thumb, left thumb: Secondary | ICD-10-CM | POA: Diagnosis not present

## 2018-09-12 DIAGNOSIS — L281 Prurigo nodularis: Secondary | ICD-10-CM | POA: Diagnosis not present

## 2018-09-12 DIAGNOSIS — L400 Psoriasis vulgaris: Secondary | ICD-10-CM | POA: Diagnosis not present

## 2018-09-12 DIAGNOSIS — L299 Pruritus, unspecified: Secondary | ICD-10-CM | POA: Diagnosis not present

## 2018-09-12 DIAGNOSIS — Z79899 Other long term (current) drug therapy: Secondary | ICD-10-CM | POA: Diagnosis not present

## 2018-09-12 DIAGNOSIS — L4050 Arthropathic psoriasis, unspecified: Secondary | ICD-10-CM | POA: Diagnosis not present

## 2018-09-16 DIAGNOSIS — I2510 Atherosclerotic heart disease of native coronary artery without angina pectoris: Secondary | ICD-10-CM | POA: Diagnosis not present

## 2018-09-16 DIAGNOSIS — I10 Essential (primary) hypertension: Secondary | ICD-10-CM | POA: Diagnosis not present

## 2018-09-25 DIAGNOSIS — M67919 Unspecified disorder of synovium and tendon, unspecified shoulder: Secondary | ICD-10-CM | POA: Diagnosis not present

## 2018-09-25 DIAGNOSIS — M25512 Pain in left shoulder: Secondary | ICD-10-CM | POA: Diagnosis not present

## 2018-09-25 DIAGNOSIS — Z6831 Body mass index (BMI) 31.0-31.9, adult: Secondary | ICD-10-CM | POA: Diagnosis not present

## 2018-09-25 DIAGNOSIS — M19012 Primary osteoarthritis, left shoulder: Secondary | ICD-10-CM | POA: Diagnosis not present

## 2018-09-25 DIAGNOSIS — M719 Bursopathy, unspecified: Secondary | ICD-10-CM | POA: Diagnosis not present

## 2018-09-30 DIAGNOSIS — L4050 Arthropathic psoriasis, unspecified: Secondary | ICD-10-CM | POA: Diagnosis not present

## 2018-09-30 DIAGNOSIS — Z6832 Body mass index (BMI) 32.0-32.9, adult: Secondary | ICD-10-CM | POA: Diagnosis not present

## 2018-09-30 DIAGNOSIS — J019 Acute sinusitis, unspecified: Secondary | ICD-10-CM | POA: Diagnosis not present

## 2018-09-30 DIAGNOSIS — Z299 Encounter for prophylactic measures, unspecified: Secondary | ICD-10-CM | POA: Diagnosis not present

## 2018-09-30 DIAGNOSIS — I10 Essential (primary) hypertension: Secondary | ICD-10-CM | POA: Diagnosis not present

## 2018-09-30 DIAGNOSIS — G4733 Obstructive sleep apnea (adult) (pediatric): Secondary | ICD-10-CM | POA: Diagnosis not present

## 2018-09-30 DIAGNOSIS — Z789 Other specified health status: Secondary | ICD-10-CM | POA: Diagnosis not present

## 2018-09-30 DIAGNOSIS — L409 Psoriasis, unspecified: Secondary | ICD-10-CM | POA: Diagnosis not present

## 2018-10-15 DIAGNOSIS — R159 Full incontinence of feces: Secondary | ICD-10-CM | POA: Diagnosis not present

## 2018-10-15 DIAGNOSIS — K5150 Left sided colitis without complications: Secondary | ICD-10-CM | POA: Diagnosis not present

## 2018-10-15 DIAGNOSIS — K6289 Other specified diseases of anus and rectum: Secondary | ICD-10-CM | POA: Diagnosis not present

## 2018-10-15 DIAGNOSIS — L290 Pruritus ani: Secondary | ICD-10-CM | POA: Diagnosis not present

## 2018-10-17 DIAGNOSIS — L299 Pruritus, unspecified: Secondary | ICD-10-CM | POA: Diagnosis not present

## 2018-10-17 DIAGNOSIS — L281 Prurigo nodularis: Secondary | ICD-10-CM | POA: Diagnosis not present

## 2018-10-17 DIAGNOSIS — D485 Neoplasm of uncertain behavior of skin: Secondary | ICD-10-CM | POA: Diagnosis not present

## 2018-10-17 DIAGNOSIS — L308 Other specified dermatitis: Secondary | ICD-10-CM | POA: Diagnosis not present

## 2018-10-17 DIAGNOSIS — L209 Atopic dermatitis, unspecified: Secondary | ICD-10-CM | POA: Diagnosis not present

## 2018-10-27 ENCOUNTER — Other Ambulatory Visit: Payer: Self-pay | Admitting: Physician Assistant

## 2018-10-27 ENCOUNTER — Other Ambulatory Visit: Payer: Self-pay | Admitting: Cardiology

## 2018-10-30 ENCOUNTER — Other Ambulatory Visit: Payer: Self-pay | Admitting: Cardiology

## 2018-10-30 MED ORDER — AMLODIPINE BESYLATE 5 MG PO TABS: 5.0000 mg | ORAL_TABLET | Freq: Every day | ORAL | 0 refills | Status: DC

## 2018-10-30 NOTE — Telephone Encounter (Signed)
Medication sent to pharmacy  

## 2018-10-30 NOTE — Telephone Encounter (Signed)
New Message           *STAT* If patient is at the pharmacy, call can be transferred to refill team.   1. Which medications need to be refilled? (please list name of each medication and dose if known) Amlodpine2.5  2. Which pharmacy/location (including street and city if local pharmacy) is medication to be sent to?[place]  3. Do they need a 30 day or 90 day supply? [place]

## 2018-10-31 DIAGNOSIS — L299 Pruritus, unspecified: Secondary | ICD-10-CM | POA: Diagnosis not present

## 2018-10-31 DIAGNOSIS — L209 Atopic dermatitis, unspecified: Secondary | ICD-10-CM | POA: Diagnosis not present

## 2018-10-31 DIAGNOSIS — Z79899 Other long term (current) drug therapy: Secondary | ICD-10-CM | POA: Diagnosis not present

## 2018-10-31 DIAGNOSIS — L281 Prurigo nodularis: Secondary | ICD-10-CM | POA: Diagnosis not present

## 2018-11-05 ENCOUNTER — Other Ambulatory Visit (HOSPITAL_COMMUNITY): Payer: Self-pay | Admitting: Cardiology

## 2018-11-12 DIAGNOSIS — Z87442 Personal history of urinary calculi: Secondary | ICD-10-CM | POA: Diagnosis not present

## 2018-11-12 DIAGNOSIS — I10 Essential (primary) hypertension: Secondary | ICD-10-CM | POA: Diagnosis not present

## 2018-11-12 DIAGNOSIS — K5790 Diverticulosis of intestine, part unspecified, without perforation or abscess without bleeding: Secondary | ICD-10-CM | POA: Diagnosis not present

## 2018-11-12 DIAGNOSIS — N281 Cyst of kidney, acquired: Secondary | ICD-10-CM | POA: Diagnosis not present

## 2018-11-12 DIAGNOSIS — R0602 Shortness of breath: Secondary | ICD-10-CM | POA: Diagnosis not present

## 2018-11-12 DIAGNOSIS — K2900 Acute gastritis without bleeding: Secondary | ICD-10-CM | POA: Diagnosis not present

## 2018-11-12 DIAGNOSIS — K6389 Other specified diseases of intestine: Secondary | ICD-10-CM | POA: Diagnosis not present

## 2018-11-12 DIAGNOSIS — K5792 Diverticulitis of intestine, part unspecified, without perforation or abscess without bleeding: Secondary | ICD-10-CM | POA: Diagnosis not present

## 2018-11-14 DIAGNOSIS — R10816 Epigastric abdominal tenderness: Secondary | ICD-10-CM | POA: Diagnosis not present

## 2018-11-14 DIAGNOSIS — R10811 Right upper quadrant abdominal tenderness: Secondary | ICD-10-CM | POA: Diagnosis not present

## 2018-11-14 DIAGNOSIS — R142 Eructation: Secondary | ICD-10-CM | POA: Diagnosis not present

## 2018-11-14 DIAGNOSIS — R1010 Upper abdominal pain, unspecified: Secondary | ICD-10-CM | POA: Diagnosis not present

## 2018-11-21 DIAGNOSIS — R400 Somnolence: Secondary | ICD-10-CM | POA: Diagnosis not present

## 2018-11-21 DIAGNOSIS — I10 Essential (primary) hypertension: Secondary | ICD-10-CM | POA: Diagnosis not present

## 2018-11-21 DIAGNOSIS — G4733 Obstructive sleep apnea (adult) (pediatric): Secondary | ICD-10-CM | POA: Diagnosis not present

## 2018-11-21 DIAGNOSIS — J449 Chronic obstructive pulmonary disease, unspecified: Secondary | ICD-10-CM | POA: Diagnosis not present

## 2018-12-05 ENCOUNTER — Encounter: Payer: Self-pay | Admitting: *Deleted

## 2018-12-05 ENCOUNTER — Telehealth: Payer: Self-pay | Admitting: *Deleted

## 2018-12-05 NOTE — Telephone Encounter (Signed)
Called to offer virtual visit. Spoke with wife who says that patient does not want to do a virtual visit and wishes to wait to be seen in the office. Advised that appointment would be in July or August 2020 and wife says that would be okay.     Primary Cardiologist:  Rozann Lesches, MD   Patient contacted.  History reviewed.  No symptoms to suggest any unstable cardiac conditions.  Based on discussion, with current pandemic situation, we will be postponing this appointment for [name] with a plan for f/u in July 2020 or sooner if feasible/necessary.  If symptoms change, he has been instructed to contact our office.    [name], RN  12/05/2018 9:03 AM         .

## 2018-12-05 NOTE — Telephone Encounter (Signed)
This encounter was created in error - please disregard.

## 2018-12-09 ENCOUNTER — Ambulatory Visit: Payer: Medicare Other | Admitting: Cardiology

## 2018-12-10 DIAGNOSIS — M65322 Trigger finger, left index finger: Secondary | ICD-10-CM | POA: Diagnosis not present

## 2018-12-10 DIAGNOSIS — M65352 Trigger finger, left little finger: Secondary | ICD-10-CM | POA: Diagnosis not present

## 2018-12-20 DIAGNOSIS — R2681 Unsteadiness on feet: Secondary | ICD-10-CM | POA: Diagnosis not present

## 2018-12-20 DIAGNOSIS — M5412 Radiculopathy, cervical region: Secondary | ICD-10-CM | POA: Diagnosis not present

## 2018-12-20 DIAGNOSIS — M5416 Radiculopathy, lumbar region: Secondary | ICD-10-CM | POA: Diagnosis not present

## 2018-12-25 DIAGNOSIS — M5021 Other cervical disc displacement,  high cervical region: Secondary | ICD-10-CM | POA: Diagnosis not present

## 2018-12-25 DIAGNOSIS — M5136 Other intervertebral disc degeneration, lumbar region: Secondary | ICD-10-CM | POA: Diagnosis not present

## 2018-12-25 DIAGNOSIS — M5126 Other intervertebral disc displacement, lumbar region: Secondary | ICD-10-CM | POA: Diagnosis not present

## 2018-12-25 DIAGNOSIS — M50322 Other cervical disc degeneration at C5-C6 level: Secondary | ICD-10-CM | POA: Diagnosis not present

## 2018-12-25 DIAGNOSIS — M5127 Other intervertebral disc displacement, lumbosacral region: Secondary | ICD-10-CM | POA: Diagnosis not present

## 2018-12-25 DIAGNOSIS — M5412 Radiculopathy, cervical region: Secondary | ICD-10-CM | POA: Diagnosis not present

## 2018-12-25 DIAGNOSIS — R2681 Unsteadiness on feet: Secondary | ICD-10-CM | POA: Diagnosis not present

## 2018-12-30 DIAGNOSIS — Z79899 Other long term (current) drug therapy: Secondary | ICD-10-CM | POA: Diagnosis not present

## 2018-12-30 DIAGNOSIS — L209 Atopic dermatitis, unspecified: Secondary | ICD-10-CM | POA: Diagnosis not present

## 2018-12-30 DIAGNOSIS — L281 Prurigo nodularis: Secondary | ICD-10-CM | POA: Diagnosis not present

## 2018-12-30 DIAGNOSIS — L299 Pruritus, unspecified: Secondary | ICD-10-CM | POA: Diagnosis not present

## 2019-01-01 DIAGNOSIS — I10 Essential (primary) hypertension: Secondary | ICD-10-CM | POA: Diagnosis not present

## 2019-01-01 DIAGNOSIS — Z299 Encounter for prophylactic measures, unspecified: Secondary | ICD-10-CM | POA: Diagnosis not present

## 2019-01-01 DIAGNOSIS — M545 Low back pain: Secondary | ICD-10-CM | POA: Diagnosis not present

## 2019-01-01 DIAGNOSIS — G4733 Obstructive sleep apnea (adult) (pediatric): Secondary | ICD-10-CM | POA: Diagnosis not present

## 2019-01-01 DIAGNOSIS — Z6832 Body mass index (BMI) 32.0-32.9, adult: Secondary | ICD-10-CM | POA: Diagnosis not present

## 2019-01-01 DIAGNOSIS — G4700 Insomnia, unspecified: Secondary | ICD-10-CM | POA: Diagnosis not present

## 2019-01-01 DIAGNOSIS — M542 Cervicalgia: Secondary | ICD-10-CM | POA: Diagnosis not present

## 2019-01-16 DIAGNOSIS — G4733 Obstructive sleep apnea (adult) (pediatric): Secondary | ICD-10-CM | POA: Diagnosis not present

## 2019-01-16 DIAGNOSIS — J069 Acute upper respiratory infection, unspecified: Secondary | ICD-10-CM | POA: Diagnosis not present

## 2019-01-16 DIAGNOSIS — Z6832 Body mass index (BMI) 32.0-32.9, adult: Secondary | ICD-10-CM | POA: Diagnosis not present

## 2019-01-16 DIAGNOSIS — Z789 Other specified health status: Secondary | ICD-10-CM | POA: Diagnosis not present

## 2019-01-16 DIAGNOSIS — Z299 Encounter for prophylactic measures, unspecified: Secondary | ICD-10-CM | POA: Diagnosis not present

## 2019-01-16 DIAGNOSIS — J45909 Unspecified asthma, uncomplicated: Secondary | ICD-10-CM | POA: Diagnosis not present

## 2019-01-29 ENCOUNTER — Other Ambulatory Visit: Payer: Self-pay | Admitting: Cardiology

## 2019-01-31 DIAGNOSIS — M5412 Radiculopathy, cervical region: Secondary | ICD-10-CM | POA: Diagnosis not present

## 2019-01-31 DIAGNOSIS — M5416 Radiculopathy, lumbar region: Secondary | ICD-10-CM | POA: Diagnosis not present

## 2019-02-03 DIAGNOSIS — L409 Psoriasis, unspecified: Secondary | ICD-10-CM | POA: Diagnosis not present

## 2019-02-03 DIAGNOSIS — J4550 Severe persistent asthma, uncomplicated: Secondary | ICD-10-CM | POA: Diagnosis not present

## 2019-02-03 DIAGNOSIS — D869 Sarcoidosis, unspecified: Secondary | ICD-10-CM | POA: Diagnosis not present

## 2019-02-03 DIAGNOSIS — G4733 Obstructive sleep apnea (adult) (pediatric): Secondary | ICD-10-CM | POA: Diagnosis not present

## 2019-02-07 ENCOUNTER — Other Ambulatory Visit: Payer: Self-pay | Admitting: Cardiology

## 2019-02-10 DIAGNOSIS — K5150 Left sided colitis without complications: Secondary | ICD-10-CM | POA: Diagnosis not present

## 2019-02-10 DIAGNOSIS — R1010 Upper abdominal pain, unspecified: Secondary | ICD-10-CM | POA: Diagnosis not present

## 2019-02-10 DIAGNOSIS — K5732 Diverticulitis of large intestine without perforation or abscess without bleeding: Secondary | ICD-10-CM | POA: Diagnosis not present

## 2019-02-26 DIAGNOSIS — I2510 Atherosclerotic heart disease of native coronary artery without angina pectoris: Secondary | ICD-10-CM | POA: Diagnosis not present

## 2019-02-26 DIAGNOSIS — I10 Essential (primary) hypertension: Secondary | ICD-10-CM | POA: Diagnosis not present

## 2019-03-11 ENCOUNTER — Encounter: Payer: Self-pay | Admitting: Cardiology

## 2019-03-11 ENCOUNTER — Other Ambulatory Visit: Payer: Self-pay

## 2019-03-11 ENCOUNTER — Ambulatory Visit (INDEPENDENT_AMBULATORY_CARE_PROVIDER_SITE_OTHER): Payer: Medicare Other | Admitting: Cardiology

## 2019-03-11 VITALS — BP 134/70 | HR 62 | Temp 99.6°F | Ht 71.0 in | Wt 229.2 lb

## 2019-03-11 DIAGNOSIS — I10 Essential (primary) hypertension: Secondary | ICD-10-CM | POA: Diagnosis not present

## 2019-03-11 DIAGNOSIS — I25119 Atherosclerotic heart disease of native coronary artery with unspecified angina pectoris: Secondary | ICD-10-CM | POA: Diagnosis not present

## 2019-03-11 DIAGNOSIS — E782 Mixed hyperlipidemia: Secondary | ICD-10-CM | POA: Diagnosis not present

## 2019-03-11 NOTE — Addendum Note (Signed)
Addended by: Julian Hy T on: 03/11/2019 04:57 PM   Modules accepted: Orders

## 2019-03-11 NOTE — Patient Instructions (Addendum)

## 2019-03-11 NOTE — Progress Notes (Signed)
Cardiology Office Note  Date: 03/11/2019   ID: Richard Webb, DOB Aug 31, 1945, MRN [id number]  PCP:  [name], MD  Cardiologist:  Rozann Lesches, MD Electrophysiologist:  None   Chief Complaint  Patient presents with  . Coronary Artery Disease    History of Present Illness: Richard Webb is a 73 y.o. male last seen in September 2019.  He presents for a routine follow-up visit.  Since last assessment he does not report any progressive angina symptoms and has chronic stable dyspnea on exertion.  He is still working full-time as a Scientist, water quality.  I reviewed his medications which are outlined below.  He reports no intolerances.  He is due for follow-up with PCP and lab work.  I personally reviewed his ECG today which shows sinus rhythm with prolonged PR interval.  He states that previously noted weakness and fatigue has improved since he stopped gabapentin.  Past Medical History:  Diagnosis Date  . Arthritis   . Asthma   . Coronary atherosclerosis of native coronary artery    a. DES x 3 RCA and DES mCx 10/11 - [place] - with residual 50-70% stenosis in the distal Cx in AV groove, 70% distal LAD beyond apex, 70% small D1. b. 12/2017: similar results with multivessel CAD. Patent stents. Medical management recommended.   Marland Kitchen GERD (gastroesophageal reflux disease)   . History of kidney stones   . Mixed hyperlipidemia   . Obstructive sleep apnea   . Pulmonary hypertension (HCC)    PASP 50 mmHg  . Restless leg syndrome   . Sarcoidosis   . Statin intolerance   . Ulcerative colitis [facility])     Past Surgical History:  Procedure Laterality Date  . Ankle fracture repair Left 1985  . CARDIAC CATHETERIZATION     stents x4  . CHOLECYSTECTOMY  1970  . CYSTOSCOPY WITH INSERTION OF UROLIFT N/A 08/27/2017   Procedure: CYSTOSCOPY WITH INSERTION OF UROLIFT;  Surgeon: Cleon Gustin, MD;  Location: [facility];  Service: Urology;  Laterality: N/A;  . LAPAROSCOPIC APPENDECTOMY  2013   . LEFT HEART CATH AND CORONARY ANGIOGRAPHY N/A 01/02/2018   Procedure: LEFT HEART CATH AND CORONARY ANGIOGRAPHY;  Surgeon: Troy Sine, MD;  Location: [place] [facility];  Service: Cardiovascular;  Laterality: N/A;  . Right total knee replacement Bilateral 2005    Current Outpatient Medications  Medication Sig Dispense Refill  . amLODipine (NORVASC) 5 MG tablet TAKE 1 TABLET(5 MG) BY MOUTH DAILY 90 tablet 3  . aspirin 81 MG tablet Take 81 mg by mouth daily.    Marland Kitchen BREO ELLIPTA 200-25 MCG/INH AEPB Inhale 1 Inhaler into the lungs daily.  3  . carbidopa-levodopa (SINEMET IR) 10-100 MG per tablet Take 1 tablet by mouth at bedtime as needed (restless legs).     . cetirizine (ZYRTEC) 10 MG tablet Take 10 mg by mouth daily.    . Cyanocobalamin (B-12) 1000 MCG/ML KIT Inject 1,000 mcg as directed every 30 (thirty) days.    Marland Kitchen dicyclomine (BENTYL) 10 MG capsule Take 10 mg by mouth 2 (two) times daily as needed for spasms.     . Dupilumab (DUPIXENT [place]) Inject into the skin every 14 (fourteen) days.    Marland Kitchen EPINEPHrine (EPIPEN JR) 0.15 MG/0.3ML injection Inject 0.15 mg into the muscle as needed for anaphylaxis.     Marland Kitchen ezetimibe (ZETIA) 10 MG tablet TAKE 1 TABLET(10 MG) BY MOUTH DAILY 90 tablet 0  . HYDROcodone-acetaminophen (NORCO/VICODIN) 5-325 MG tablet Take 1 tablet  by mouth every 8 (eight) hours as needed for moderate pain.     . hydrocortisone cream 1 % Apply 1 application topically 2 (two) times daily as needed for itching (psoriasis).    Marland Kitchen losartan (COZAAR) 50 MG tablet Take 50 mg by mouth daily.    . meloxicam (MOBIC) 15 MG tablet Take 15 mg by mouth daily.    . metoprolol tartrate (LOPRESSOR) 25 MG tablet Take half tablet (12.77m) by mouth twice daily. 90 tablet 1  . montelukast (SINGULAIR) 10 MG tablet Take 10 mg by mouth at bedtime.    . nitroGLYCERIN (NITROSTAT) 0.4 MG SL tablet Place 1 tablet (0.4 mg total) under the tongue every 5 (five) minutes as needed for chest pain. 90 tablet 3  . NON  FORMULARY CPAP UAD    . oxymetazoline (AFRIN) 0.05 % nasal spray Place 1 spray into both nostrils at bedtime.    . Probiotic Product (PROBIOTIC COMPLEX ACIDOPHILUS) CAPS Take 1 capsule by mouth daily.    .Marland KitchensulfaSALAzine (AZULFIDINE) 500 MG tablet Take 1,000 mg by mouth 2 (two) times daily.    . temazepam (RESTORIL) 30 MG capsule Take 30 mg by mouth at bedtime as needed for sleep.     .Marland KitchentiZANidine (ZANAFLEX) 4 MG tablet Take 4 mg by mouth as needed for muscle spasms.    . Vitamin D, Ergocalciferol, (DRISDOL) 50000 UNITS CAPS capsule Take 50,000 Units by mouth every 7 (seven) days. fridays    . isosorbide mononitrate (IMDUR) 30 MG 24 hr tablet TAKE 1 TABLET BY MOUTH DAILY (Patient not taking: Reported on 03/11/2019) 90 tablet 3   No current facility-administered medications for this visit.    Allergies:  Bee venom and Statins   Social History: The patient  reports that he has never smoked. He has never used smokeless tobacco. He reports that he does not drink alcohol or use drugs.   ROS:  Please see the history of present illness. Otherwise, complete review of systems is positive for none.  All other systems are reviewed and negative.   Physical Exam: VS:  BP 134/70   Pulse 62   Temp 99.6 F (37.6 C)   Ht _0  (1.803 m)   Wt 229 lb 3.2 oz (104 kg)   SpO2 97%   BMI 31.97 kg/m , BMI Body mass index is 31.97 kg/m.  Wt Readings from Last 3 Encounters:  03/11/19 229 lb 3.2 oz (104 kg)  05/01/18 226 lb (102.5 kg)  01/24/18 219 lb (99.3 kg)    General: Patient appears comfortable at rest. HEENT: Conjunctiva and lids normal, wearing a mask. Neck: Supple, no elevated JVP or carotid bruits, no thyromegaly. Lungs: Clear to auscultation, nonlabored breathing at rest. Cardiac: Regular rate and rhythm, no S3 or significant systolic murmur. Abdomen: Soft, nontender, bowel sounds present. Extremities: No pitting edema, distal pulses 2+. Skin: Warm and dry. Musculoskeletal: No kyphosis.  Neuropsychiatric: Alert and oriented x3, affect grossly appropriate.  ECG:  An ECG dated 01/02/2018 was personally reviewed today and demonstrated:  Sinus rhythm with prolonged PR interval.  Recent Labwork:  July 2019: BUN 16, creatinine 0.98, potassium 4.4, AST 22, ALT 19, hemoglobin 12.8, platelets 220, cholesterol 128, triglycerides 71, HDL 44, LDL 70  Other Studies Reviewed Today:  Echocardiogram 10/23/2017: Study Conclusions  - Left ventricle: The cavity size was normal. Wall thickness was increased increased in a pattern of mild to moderate LVH. Systolic function was normal. The estimated ejection fraction was in the range of  55% to 60%. Wall motion was normal; there were no regional wall motion abnormalities. Doppler parameters are consistent with abnormal left ventricular relaxation (grade 1 diastolic dysfunction). - Aortic valve: Mildly calcified annulus. Trileaflet; mildly thickened leaflets. Valve area (VTI): 2.54 cm^2. Valve area (Vmax): 2.26 cm^2. - Mitral valve: Mildly calcified annulus. Normal thickness leaflets. - Technically adequate study.  Cardiac catheterization 01/02/2018:  Previously placed Dist RCA stent (unknown type) is widely patent.  Previously placed Ost RCA to Prox RCA stent (unknown type) is widely patent.  Prox RCA to Mid RCA lesion is 60% stenosed.  Mid RCA lesion is 70% stenosed.  Acute Mrg lesion is 95% stenosed.  Post Atrio lesion is 60% stenosed.  Ost RPDA lesion is 70% stenosed.  Previously placed Prox Cx stent (unknown type) is widely patent.  Mid Cx to Dist Cx lesion is 60% stenosed.  Ost 3rd Mrg to 3rd Mrg lesion is 50% stenosed.  Ost 2nd Diag to 2nd Diag lesion is 70% stenosed.  Mid LAD lesion is 70% stenosed.  Dist LAD lesion is 30% stenosed.  Prox LAD lesion is 55% stenosed.  The left ventricular ejection fraction is greater than 65% by visual estimate.  There is hyperdynamic left ventricular systolic  function.  LV end diastolic pressure is normal.  Hyperdynamic LV function with a "spade-like ventricle "with an EF of at least 75%.  Multivessel CAD with 55% proximal LAD stenosis after the first small first diagonal vessel, with diffuse 70% bifurcation stenoses involving the mid LAD and second diagonal vessel and 30% distal apical diffuse stenoses; patent left circumflex stent in the AV groove between the first small marginal branch and second large marginal vessel. There is diffuse 50 to 60% stenoses in the distal AV groove circumflex and bifurcating OM 3 vessel; large dominant RCA with patent proximal stent, diffuse 60 to 70% mid stenoses with 95% ostial stenosis and a small RV marginal branch and patent distal RCA stent proximal to the PDA takeoff with 60 to 70% stenoses at the origin of the PDA and continuation branch beyond the PDA vessel.  Assessment and Plan:  1.  Multivessel CAD status post DES x3 to the RCA and DES to the obtuse marginal.  Cardiac catheterization from May of last year as outlined above with plan for medical therapy.  He does not report any accelerating symptoms on current regimen.  ECG reviewed.  2.  Mixed hyperlipidemia.  Continues on Zetia and Pravachol.  Last LDL was 70.  He is due for follow-up with PCP soon.  3.  Essential hypertension, systolic is in the 425Z today.  No changes made in present regimen.  Medication Adjustments/Labs and Tests Ordered: Current medicines are reviewed at length with the patient today.  Concerns regarding medicines are outlined above.   Tests Ordered: No orders of the defined types were placed in this encounter.   Medication Changes: No orders of the defined types were placed in this encounter.   Disposition:  Follow up 6 months in the [place] office.  Signed, Satira Sark, MD, [facility] 03/11/2019 3:57 PM    [facility] at [place], [place], [place] [address] Phone: [phone]; Fax:  [phone]

## 2019-03-12 ENCOUNTER — Other Ambulatory Visit: Payer: Self-pay | Admitting: Cardiology

## 2019-03-12 MED ORDER — ISOSORBIDE MONONITRATE ER 30 MG PO TB24: 30.0000 mg | ORAL_TABLET | Freq: Every day | ORAL | 3 refills | Status: DC

## 2019-03-12 NOTE — Telephone Encounter (Signed)
° ° ° °  1. Which medications need to be refilled? (please list name of each medication and dose if known)  isosorbide mononitrate (IMDUR) 30 MG    2. Which pharmacy/location (including street and city if local pharmacy) is medication to be sent to? [facility]  3. Do they need a 30 day or 90 day supply?

## 2019-03-14 ENCOUNTER — Other Ambulatory Visit: Payer: Medicare Other

## 2019-03-14 DIAGNOSIS — Z20822 Contact with and (suspected) exposure to covid-19: Secondary | ICD-10-CM

## 2019-03-14 DIAGNOSIS — R6889 Other general symptoms and signs: Secondary | ICD-10-CM | POA: Diagnosis not present

## 2019-03-17 DIAGNOSIS — M545 Low back pain: Secondary | ICD-10-CM | POA: Diagnosis not present

## 2019-03-17 DIAGNOSIS — M5410 Radiculopathy, site unspecified: Secondary | ICD-10-CM | POA: Diagnosis not present

## 2019-03-17 DIAGNOSIS — M5417 Radiculopathy, lumbosacral region: Secondary | ICD-10-CM | POA: Diagnosis not present

## 2019-03-17 LAB — NOVEL CORONAVIRUS, NAA: SARS-CoV-2, NAA: NOT DETECTED

## 2019-03-25 DIAGNOSIS — Z299 Encounter for prophylactic measures, unspecified: Secondary | ICD-10-CM | POA: Diagnosis not present

## 2019-03-25 DIAGNOSIS — Z79899 Other long term (current) drug therapy: Secondary | ICD-10-CM | POA: Diagnosis not present

## 2019-03-25 DIAGNOSIS — E7800 Pure hypercholesterolemia, unspecified: Secondary | ICD-10-CM | POA: Diagnosis not present

## 2019-03-25 DIAGNOSIS — I10 Essential (primary) hypertension: Secondary | ICD-10-CM | POA: Diagnosis not present

## 2019-03-25 DIAGNOSIS — R5383 Other fatigue: Secondary | ICD-10-CM | POA: Diagnosis not present

## 2019-03-25 DIAGNOSIS — G4733 Obstructive sleep apnea (adult) (pediatric): Secondary | ICD-10-CM | POA: Diagnosis not present

## 2019-03-25 DIAGNOSIS — Z125 Encounter for screening for malignant neoplasm of prostate: Secondary | ICD-10-CM | POA: Diagnosis not present

## 2019-03-25 DIAGNOSIS — Z6832 Body mass index (BMI) 32.0-32.9, adult: Secondary | ICD-10-CM | POA: Diagnosis not present

## 2019-04-01 DIAGNOSIS — D225 Melanocytic nevi of trunk: Secondary | ICD-10-CM | POA: Diagnosis not present

## 2019-04-01 DIAGNOSIS — Z79899 Other long term (current) drug therapy: Secondary | ICD-10-CM | POA: Diagnosis not present

## 2019-04-01 DIAGNOSIS — L209 Atopic dermatitis, unspecified: Secondary | ICD-10-CM | POA: Diagnosis not present

## 2019-04-01 DIAGNOSIS — L299 Pruritus, unspecified: Secondary | ICD-10-CM | POA: Diagnosis not present

## 2019-04-01 DIAGNOSIS — D224 Melanocytic nevi of scalp and neck: Secondary | ICD-10-CM | POA: Diagnosis not present

## 2019-04-01 DIAGNOSIS — L281 Prurigo nodularis: Secondary | ICD-10-CM | POA: Diagnosis not present

## 2019-04-01 DIAGNOSIS — L570 Actinic keratosis: Secondary | ICD-10-CM | POA: Diagnosis not present

## 2019-04-03 DIAGNOSIS — Z7982 Long term (current) use of aspirin: Secondary | ICD-10-CM | POA: Diagnosis not present

## 2019-04-03 DIAGNOSIS — M542 Cervicalgia: Secondary | ICD-10-CM | POA: Diagnosis not present

## 2019-04-03 DIAGNOSIS — M5412 Radiculopathy, cervical region: Secondary | ICD-10-CM | POA: Diagnosis not present

## 2019-04-22 ENCOUNTER — Other Ambulatory Visit: Payer: Self-pay | Admitting: *Deleted

## 2019-04-22 MED ORDER — AMLODIPINE BESYLATE 5 MG PO TABS: 5.0000 mg | ORAL_TABLET | Freq: Every day | ORAL | 1 refills | Status: DC

## 2019-04-22 MED ORDER — ISOSORBIDE MONONITRATE ER 30 MG PO TB24: 30.0000 mg | ORAL_TABLET | Freq: Every day | ORAL | 1 refills | Status: DC

## 2019-04-22 MED ORDER — EZETIMIBE 10 MG PO TABS: 10.0000 mg | ORAL_TABLET | Freq: Every day | ORAL | 1 refills | Status: DC

## 2019-04-29 ENCOUNTER — Other Ambulatory Visit: Payer: Self-pay | Admitting: *Deleted

## 2019-04-29 MED ORDER — LOSARTAN POTASSIUM 50 MG PO TABS: 50.0000 mg | ORAL_TABLET | Freq: Every day | ORAL | 3 refills | Status: DC

## 2019-05-05 DIAGNOSIS — R102 Pelvic and perineal pain: Secondary | ICD-10-CM | POA: Diagnosis not present

## 2019-05-05 DIAGNOSIS — K5190 Ulcerative colitis, unspecified, without complications: Secondary | ICD-10-CM | POA: Diagnosis not present

## 2019-05-06 ENCOUNTER — Other Ambulatory Visit: Payer: Self-pay | Admitting: Cardiology

## 2019-05-06 MED ORDER — METOPROLOL TARTRATE 25 MG PO TABS: ORAL_TABLET | ORAL | 1 refills | Status: DC

## 2019-05-06 NOTE — Telephone Encounter (Signed)
° ° °  1. Which medications need to be refilled? (please list name of each medication and dose if known) metoprolol tartrate (LOPRESSOR) 25 MG    2. Which pharmacy/location (including street and city if local pharmacy) is medication to be sent to?[facility]  3. Do they need a 30 day or 90 day supply? 90  Please note that patient is changing all medications to [employer] [facility].

## 2019-05-06 NOTE — Telephone Encounter (Signed)
Medication sent to pharmacy  

## 2019-05-22 DIAGNOSIS — N5201 Erectile dysfunction due to arterial insufficiency: Secondary | ICD-10-CM | POA: Diagnosis not present

## 2019-05-22 DIAGNOSIS — N401 Enlarged prostate with lower urinary tract symptoms: Secondary | ICD-10-CM | POA: Diagnosis not present

## 2019-05-22 DIAGNOSIS — R350 Frequency of micturition: Secondary | ICD-10-CM | POA: Diagnosis not present

## 2019-05-27 ENCOUNTER — Other Ambulatory Visit: Payer: Self-pay

## 2019-05-27 DIAGNOSIS — Z20828 Contact with and (suspected) exposure to other viral communicable diseases: Secondary | ICD-10-CM | POA: Diagnosis not present

## 2019-05-27 DIAGNOSIS — Z20822 Contact with and (suspected) exposure to covid-19: Secondary | ICD-10-CM

## 2019-05-29 LAB — NOVEL CORONAVIRUS, NAA: SARS-CoV-2, NAA: NOT DETECTED

## 2019-06-02 DIAGNOSIS — Z23 Encounter for immunization: Secondary | ICD-10-CM | POA: Diagnosis not present

## 2019-06-23 DIAGNOSIS — L820 Inflamed seborrheic keratosis: Secondary | ICD-10-CM | POA: Diagnosis not present

## 2019-06-23 DIAGNOSIS — Z79899 Other long term (current) drug therapy: Secondary | ICD-10-CM | POA: Diagnosis not present

## 2019-06-23 DIAGNOSIS — L919 Hypertrophic disorder of the skin, unspecified: Secondary | ICD-10-CM | POA: Diagnosis not present

## 2019-06-23 DIAGNOSIS — L281 Prurigo nodularis: Secondary | ICD-10-CM | POA: Diagnosis not present

## 2019-06-23 DIAGNOSIS — L821 Other seborrheic keratosis: Secondary | ICD-10-CM | POA: Diagnosis not present

## 2019-06-23 DIAGNOSIS — L209 Atopic dermatitis, unspecified: Secondary | ICD-10-CM | POA: Diagnosis not present

## 2019-06-24 DIAGNOSIS — H26493 Other secondary cataract, bilateral: Secondary | ICD-10-CM | POA: Diagnosis not present

## 2019-06-24 DIAGNOSIS — H35373 Puckering of macula, bilateral: Secondary | ICD-10-CM | POA: Diagnosis not present

## 2019-06-24 DIAGNOSIS — H5203 Hypermetropia, bilateral: Secondary | ICD-10-CM | POA: Diagnosis not present

## 2019-06-24 DIAGNOSIS — H524 Presbyopia: Secondary | ICD-10-CM | POA: Diagnosis not present

## 2019-06-26 ENCOUNTER — Telehealth: Payer: Self-pay | Admitting: Cardiology

## 2019-06-26 MED ORDER — NITROGLYCERIN 0.4 MG SL SUBL: 0.4000 mg | SUBLINGUAL_TABLET | SUBLINGUAL | 3 refills | Status: DC | PRN

## 2019-06-26 NOTE — Telephone Encounter (Signed)
Needing refill on nitroGLYCERIN (NITROSTAT) 0.4 MG SL tablet [[id number]]  Sent to [facility]

## 2019-06-30 DIAGNOSIS — G4700 Insomnia, unspecified: Secondary | ICD-10-CM | POA: Diagnosis not present

## 2019-06-30 DIAGNOSIS — Z6832 Body mass index (BMI) 32.0-32.9, adult: Secondary | ICD-10-CM | POA: Diagnosis not present

## 2019-06-30 DIAGNOSIS — I10 Essential (primary) hypertension: Secondary | ICD-10-CM | POA: Diagnosis not present

## 2019-06-30 DIAGNOSIS — Z299 Encounter for prophylactic measures, unspecified: Secondary | ICD-10-CM | POA: Diagnosis not present

## 2019-06-30 DIAGNOSIS — Z713 Dietary counseling and surveillance: Secondary | ICD-10-CM | POA: Diagnosis not present

## 2019-07-08 ENCOUNTER — Ambulatory Visit (INDEPENDENT_AMBULATORY_CARE_PROVIDER_SITE_OTHER): Payer: Medicare Other | Admitting: Urology

## 2019-07-08 DIAGNOSIS — N5201 Erectile dysfunction due to arterial insufficiency: Secondary | ICD-10-CM

## 2019-07-08 DIAGNOSIS — N401 Enlarged prostate with lower urinary tract symptoms: Secondary | ICD-10-CM | POA: Diagnosis not present

## 2019-07-08 DIAGNOSIS — R102 Pelvic and perineal pain: Secondary | ICD-10-CM | POA: Diagnosis not present

## 2019-07-08 DIAGNOSIS — R350 Frequency of micturition: Secondary | ICD-10-CM | POA: Diagnosis not present

## 2019-07-08 DIAGNOSIS — K5150 Left sided colitis without complications: Secondary | ICD-10-CM | POA: Diagnosis not present

## 2019-07-21 ENCOUNTER — Ambulatory Visit (INDEPENDENT_AMBULATORY_CARE_PROVIDER_SITE_OTHER): Payer: Medicare Other | Admitting: Family Medicine

## 2019-07-21 ENCOUNTER — Other Ambulatory Visit: Payer: Self-pay

## 2019-07-21 ENCOUNTER — Telehealth: Payer: Self-pay | Admitting: *Deleted

## 2019-07-21 VITALS — BP 138/72 | Ht 71.5 in | Wt 227.0 lb

## 2019-07-21 DIAGNOSIS — E782 Mixed hyperlipidemia: Secondary | ICD-10-CM

## 2019-07-21 DIAGNOSIS — I200 Unstable angina: Secondary | ICD-10-CM

## 2019-07-21 DIAGNOSIS — I25119 Atherosclerotic heart disease of native coronary artery with unspecified angina pectoris: Secondary | ICD-10-CM | POA: Diagnosis not present

## 2019-07-21 DIAGNOSIS — H26491 Other secondary cataract, right eye: Secondary | ICD-10-CM | POA: Diagnosis not present

## 2019-07-21 DIAGNOSIS — R0600 Dyspnea, unspecified: Secondary | ICD-10-CM

## 2019-07-21 DIAGNOSIS — H35373 Puckering of macula, bilateral: Secondary | ICD-10-CM | POA: Diagnosis not present

## 2019-07-21 DIAGNOSIS — R0609 Other forms of dyspnea: Secondary | ICD-10-CM

## 2019-07-21 MED ORDER — AMLODIPINE BESYLATE 10 MG PO TABS: 10.0000 mg | ORAL_TABLET | Freq: Every day | ORAL | 1 refills | Status: DC

## 2019-07-21 MED ORDER — ISOSORBIDE MONONITRATE ER 30 MG PO TB24: 30.0000 mg | ORAL_TABLET | Freq: Every day | ORAL | 1 refills | Status: DC

## 2019-07-21 NOTE — Patient Instructions (Addendum)
Your physician recommends that you schedule a follow-up appointment in: [place] has recommended you make the following change in your medication:   INCREASE ASPIRIN 162 MG DAILY  INCREASE AMLODIPINE 10 MG DAILY   TAKE ISOSORBIDE 30 MG DAILY    Thank you for choosing [facility]!!

## 2019-07-21 NOTE — Progress Notes (Addendum)
Cardiology Office Note  Date: 07/21/2019   ID: [name], DOB 1945/09/16, MRN [id number]  PCP:  [name], MD  Cardiologist:  Rozann Lesches, MD Electrophysiologist:  None   Chief Complaint  Patient presents with   Follow-up    Chest pain    History of Present Illness: Richard Webb is a 73 y.o. male with multiple medical problems including CAD (Multi-vessel), HTN, OSA, Sarcoidosis, HLD, Pulmonary HTN here for recent complaints of chest pain/chest pressure and dyspnea on exertion over the last several months.  States he was working in his yard on Saturday and began having chest pain/chest pressure but no associated radiation, or diaphoresis.  States he did have some nausea.  He took 2 sublingual nitroglycerin 15 minutes apart with resolution of symptoms. States he has felt fatigue since that episode.   Cardiac catheterization in May 2019 demonstrated multivessel disease and recommended more aggressive medical therapy trial. He had patent stents in LCX, Prox RCA / Distal RCA.  55% proximal LAD stenosis after the first small first diagonal vessel, with diffuse 70% bifurcation stenoses involving the mid LAD and second diagonal vessel and 30% distal apical diffuse stenoses  He admits to increasing dyspnea on exertion especially when walking up inclined surfaces.  He admits to significant exertional fatigue.  He is not very active on a daily basis.  Patient states he is not taking his Pravachol and has not taken Imdur for quite some time.   Past Medical History:  Diagnosis Date   Arthritis    Asthma    Coronary atherosclerosis of native coronary artery    a. DES x 3 RCA and DES mCx 10/11 - [place] - with residual 50-70% stenosis in the distal Cx in AV groove, 70% distal LAD beyond apex, 70% small D1. b. 12/2017: similar results with multivessel CAD. Patent stents. Medical management recommended.    GERD (gastroesophageal reflux disease)    History of kidney  stones    Mixed hyperlipidemia    Obstructive sleep apnea    Pulmonary hypertension (HCC)    PASP 50 mmHg   Restless leg syndrome    Sarcoidosis    Statin intolerance    Ulcerative colitis ([place])     Past Surgical History:  Procedure Laterality Date   Ankle fracture repair Left 1985   CARDIAC CATHETERIZATION     stents x4   CHOLECYSTECTOMY  1970   CYSTOSCOPY WITH INSERTION OF UROLIFT N/A 08/27/2017   Procedure: CYSTOSCOPY WITH INSERTION OF UROLIFT;  Surgeon: Cleon Gustin, MD;  Location: [facility];  Service: Urology;  Laterality: N/A;   LAPAROSCOPIC APPENDECTOMY  2013   LEFT HEART CATH AND CORONARY ANGIOGRAPHY N/A 01/02/2018   Procedure: LEFT HEART CATH AND CORONARY ANGIOGRAPHY;  Surgeon: Troy Sine, MD;  Location: [facility];  Service: Cardiovascular;  Laterality: N/A;   Right total knee replacement Bilateral 2005    Current Outpatient Medications  Medication Sig Dispense Refill   aspirin EC 81 MG tablet Take 162 mg by mouth daily.     BREO ELLIPTA 200-25 MCG/INH AEPB Inhale 1 Inhaler into the lungs daily.  3   carbidopa-levodopa (SINEMET IR) 10-100 MG per tablet Take 1 tablet by mouth at bedtime as needed (restless legs).      cetirizine (ZYRTEC) 10 MG tablet Take 10 mg by mouth daily.     Cyanocobalamin (B-12) 1000 MCG/ML KIT Inject 1,000 mcg as directed every 30 (thirty) days.     Dupilumab (DUPIXENT [place]) Inject into  the skin every 14 (fourteen) days.     EPINEPHrine (EPIPEN JR) 0.15 MG/0.3ML injection Inject 0.15 mg into the muscle as needed for anaphylaxis.      ezetimibe (ZETIA) 10 MG tablet Take 1 tablet (10 mg total) by mouth daily. 90 tablet 1   HYDROcodone-acetaminophen (NORCO/VICODIN) 5-325 MG tablet Take 1 tablet by mouth every 8 (eight) hours as needed for moderate pain.      hydrocortisone cream 1 % Apply 1 application topically 2 (two) times daily as needed for itching (psoriasis).     isosorbide mononitrate (IMDUR) 30 MG 24 hr  tablet Take 1 tablet (30 mg total) by mouth daily. 90 tablet 1   losartan (COZAAR) 50 MG tablet Take 1 tablet (50 mg total) by mouth daily. 90 tablet 3   meloxicam (MOBIC) 15 MG tablet Take 15 mg by mouth daily.     metoprolol tartrate (LOPRESSOR) 25 MG tablet Take half tablet (12.69m) by mouth twice daily. 90 tablet 1   montelukast (SINGULAIR) 10 MG tablet Take 10 mg by mouth at bedtime.     nitroGLYCERIN (NITROSTAT) 0.4 MG SL tablet Place 1 tablet (0.4 mg total) under the tongue every 5 (five) minutes x 3 doses as needed for chest pain (if no relief after 3rd dose, proceed to the ED for an evaluation or call 911). 90 tablet 3   NON FORMULARY CPAP UAD     oxymetazoline (AFRIN) 0.05 % nasal spray Place 1 spray into both nostrils at bedtime.     Probiotic Product (PROBIOTIC COMPLEX ACIDOPHILUS) CAPS Take 1 capsule by mouth daily.     sulfaSALAzine (AZULFIDINE) 500 MG tablet Take 1,000 mg by mouth 2 (two) times daily.     temazepam (RESTORIL) 30 MG capsule Take 30 mg by mouth at bedtime as needed for sleep.      Vitamin D, Ergocalciferol, (DRISDOL) 50000 UNITS CAPS capsule Take 50,000 Units by mouth every 7 (seven) days. fridays     amLODipine (NORVASC) 10 MG tablet Take 1 tablet (10 mg total) by mouth daily. 90 tablet 1   No current facility-administered medications for this visit.    Allergies:  Bee venom and Statins   Social History: The patient  reports that he has never smoked. He has never used smokeless tobacco. He reports that he does not drink alcohol or use drugs.   Family History: The patient's family history includes CAD in his father; Colon cancer in his mother.   ROS:  Please see the history of present illness. Otherwise, complete review of systems is positive for none.  All other systems are reviewed and negative.   Physical Exam: VS:  BP 138/72    Ht 5' 11.5" (1.816 m)    Wt 227 lb (103 kg)    BMI 31.22 kg/m , BMI Body mass index is 31.22 kg/m.  Wt Readings  from Last 3 Encounters:  07/21/19 227 lb (103 kg)  03/11/19 229 lb 3.2 oz (104 kg)  05/01/18 226 lb (102.5 kg)    General: Patient appears comfortable at rest. Neck: Supple, no elevated JVP or carotid bruits, no thyromegaly. Lungs: Clear to auscultation, nonlabored breathing at rest. Cardiac: Regular rate and rhythm, no S3 or significant systolic murmur, no pericardial rub. Abdomen: Soft, nontender, no hepatomegaly, bowel sounds present, no guarding or rebound. Extremities: No pitting edema, distal pulses 2+. Skin: Warm and dry. Musculoskeletal: No kyphosis. Neuropsychiatric: Alert and oriented x3, affect grossly appropriate.  ECG:  An ECG dated July 21, 2019 was  personally reviewed today and demonstrated:  Sinus rhythm with first-degree AV block rate of 60  Recent Labwork: No results found for requested labs within last 8760 hours.     Component Value Date/Time   CHOL 167 10/23/2017 0857   TRIG 87 10/23/2017 0857   HDL 46 10/23/2017 0857   CHOLHDL 3.6 10/23/2017 0857   VLDL 17 10/23/2017 0857   LDLCALC 104 (H) 10/23/2017 0857    Other Studies Reviewed Today: Cardiac catheterization in Jan 02, 2018 Hyperdynamic LV function with a "spade-like ventricle "with an EF of at least 75%.  Multivessel CAD with 55% proximal LAD stenosis after the first small first diagonal vessel, with diffuse 70% bifurcation stenoses involving the mid LAD and second diagonal vessel and 30% distal apical diffuse stenoses; patent left circumflex stent in the AV groove between the first small marginal branch and second large marginal vessel.  There is diffuse 50 to 60% stenoses in the distal AV groove circumflex and bifurcating OM 3 vessel; large dominant RCA with patent proximal stent, diffuse 60 to 70% mid stenoses with 95% ostial stenosis and a small RV marginal branch and patent distal RCA stent proximal to the PDA takeoff with 60 to 70% stenoses at the origin of the PDA and continuation branch beyond  the PDA vessel.  RECOMMENDATION: In light of the patient's diffuse multivessel disease, recommend an initial more aggressive medical therapy trial.  The patient has only been on low-dose amlodipine and losartan for blood pressure control.  Would add beta-blocker, nitrates, titrate amlodipine, and consider Ranexa if necessary.  In addition, recommend aggressive lipid-lowering therapy.  The patient has only been on low-dose pravastatin 20 mg and Zetia 10 mg.  Apparently there has been intolerability to Crestor and Zocor.  Would increase pravastatin or switch to atorvastatin but strongly consider PCSK9 inhibition and aim to dramatically lower LDL cholesterol and induce plaque regression.   Echocardiogram in March 2019 Study Conclusions  - Left ventricle: The cavity size was normal. Wall thickness was   increased increased in a pattern of mild to moderate LVH.   Systolic function was normal. The estimated ejection fraction was   in the range of 55% to 60%. Wall motion was normal; there were no   regional wall motion abnormalities. Doppler parameters are   consistent with abnormal left ventricular relaxation (grade 1   diastolic dysfunction). - Aortic valve: Mildly calcified annulus. Trileaflet; mildly   thickened leaflets. Valve area (VTI): 2.54 cm^2. Valve area   (Vmax): 2.26 cm^2. - Mitral valve: Mildly calcified annulus. Normal thickness leaflets   . - Technically adequate study.   Assessment and Plan:  1. Coronary artery disease involving native coronary artery of native heart with angina pectoris ([place])   2. Mixed hyperlipidemia   3. Dyspnea on exertion   4. Accelerating angina ([place])    1. 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  States he has been feeling fatigued since that episode.  2. Mixed hyperlipidemia Has been on Crestor and Zocor in the past and was intolerant.  Continues on Zetia.  He needs a referral to lipid clinic for possible treatment with Repatha or Praluent.  3. Dyspnea on exertion Complains of dyspnea on exertion worse when walking up inclines with some chest tightness.  He  has a history of pulmonary artery systolic hypertension with the PASP of 55 mmHg.  4. Accelerating angina [facility]) Patient was not tolerant of Plavix in the past causing him dizziness and significant bruising.  Increase aspirin to 162 mg daily.  Start taking Imdur 30 mg daily.  Increase amlodipine to 10 mg daily.  Medication Adjustments/Labs and Tests Ordered: Current medicines are reviewed at length with the patient today.  Concerns regarding medicines are outlined above.    Patient Instructions  Your physician recommends that you schedule a follow-up appointment in: [place]  Your physician has recommended you make the following change in your medication:   INCREASE ASPIRIN 162 MG DAILY  INCREASE AMLODIPINE 10 MG DAILY   TAKE ISOSORBIDE 30 MG DAILY    Thank you for choosing [facility]!!            Signed, Levell July, NP 07/21/2019 1:52 PM    [facility] [place], [place], [place] [address] Phone: [phone]; Fax: [phone]

## 2019-07-21 NOTE — Progress Notes (Signed)
Pt says when he exerts himself he feels chest pressure/weakness/SOB/dizziness for the last several weeks however the last few days has had worsening chest pain/pressure heaviness/pain - reviewed meds and EKG done - HR 62 BP 138/72

## 2019-07-21 NOTE — Telephone Encounter (Signed)
Pt says Saturday afternoon pt took 2 NTG for chest pain/heaviness/nausea 15 mins apart lasting 45 mins - says he doesn't feel "right" - wife says he has been pale looking and some nausea - doesn't know what HR/BP have been running - is asking to be seen but no appts until January with Dr Domenic Polite and extenders 12/31 first available - does this pt need to come in for nurse appt for EKG/vitals?

## 2019-07-21 NOTE — Telephone Encounter (Signed)
Spoke with pt wife and Jonni Sanger - pt will come at 1130am today

## 2019-07-21 NOTE — Telephone Encounter (Signed)
See if he can come by [facility] for a visit to get vital signs, review current medications, and repeat ECG.  Jonni Sanger could likely see him.  Main question is going to be whether he is reporting unstable angina and needs to be referred for a heart catheterization, versus making potential medication adjustments and following from there.

## 2019-07-30 DIAGNOSIS — Z79899 Other long term (current) drug therapy: Secondary | ICD-10-CM | POA: Diagnosis not present

## 2019-07-30 DIAGNOSIS — L299 Pruritus, unspecified: Secondary | ICD-10-CM | POA: Diagnosis not present

## 2019-07-30 DIAGNOSIS — L259 Unspecified contact dermatitis, unspecified cause: Secondary | ICD-10-CM | POA: Diagnosis not present

## 2019-07-30 DIAGNOSIS — L209 Atopic dermatitis, unspecified: Secondary | ICD-10-CM | POA: Diagnosis not present

## 2019-07-30 DIAGNOSIS — L281 Prurigo nodularis: Secondary | ICD-10-CM | POA: Diagnosis not present

## 2019-08-04 DIAGNOSIS — H26492 Other secondary cataract, left eye: Secondary | ICD-10-CM | POA: Diagnosis not present

## 2019-08-26 DIAGNOSIS — M5416 Radiculopathy, lumbar region: Secondary | ICD-10-CM | POA: Diagnosis not present

## 2019-08-27 ENCOUNTER — Telehealth: Payer: Self-pay | Admitting: Cardiology

## 2019-08-27 NOTE — Telephone Encounter (Signed)
Patient experiencing chest pain and shoulder pain  3.45 & 4:00 took [employer]

## 2019-08-27 NOTE — Telephone Encounter (Signed)
Contacted patient before seeing previous note from [place]. Reports constant chest pain that started 2 hours ago and he used 2 nitroglycerin. Reports having SOB all the time but sob was not any worse when chest pain occurred. Reports no pain at this time and he feels fine. Advised that if his symptoms return, to go to the ED for an evaluation. Verbalized understanding of plan.

## 2019-08-27 NOTE — Telephone Encounter (Signed)
Spoke with wife Vaughan Basta) - no c/o active chest pain currently.  Did not have SOB, n/v or sweating during the times that he took the Nitroglycerin previously.  He did get relief after the 2 tabs.  Wife states that he was very active today & this is when he notices the chest pain more.  States he currently feels fine.  Recently began Imdur 38m daily, increased his ASA to 1640mdaily & increased his Norvasc to 1073maily.  In the meantime, wife understands to take him to ED for evaluation if symptoms worsen.

## 2019-09-04 DIAGNOSIS — Z0000 Encounter for general adult medical examination without abnormal findings: Secondary | ICD-10-CM | POA: Diagnosis not present

## 2019-09-04 DIAGNOSIS — Z7189 Other specified counseling: Secondary | ICD-10-CM | POA: Diagnosis not present

## 2019-09-04 DIAGNOSIS — Z1331 Encounter for screening for depression: Secondary | ICD-10-CM | POA: Diagnosis not present

## 2019-09-04 DIAGNOSIS — Z6832 Body mass index (BMI) 32.0-32.9, adult: Secondary | ICD-10-CM | POA: Diagnosis not present

## 2019-09-04 DIAGNOSIS — K5190 Ulcerative colitis, unspecified, without complications: Secondary | ICD-10-CM | POA: Diagnosis not present

## 2019-09-04 DIAGNOSIS — Z299 Encounter for prophylactic measures, unspecified: Secondary | ICD-10-CM | POA: Diagnosis not present

## 2019-09-04 DIAGNOSIS — E7800 Pure hypercholesterolemia, unspecified: Secondary | ICD-10-CM | POA: Diagnosis not present

## 2019-09-04 DIAGNOSIS — I10 Essential (primary) hypertension: Secondary | ICD-10-CM | POA: Diagnosis not present

## 2019-09-04 DIAGNOSIS — Z1339 Encounter for screening examination for other mental health and behavioral disorders: Secondary | ICD-10-CM | POA: Diagnosis not present

## 2019-09-16 DIAGNOSIS — L299 Pruritus, unspecified: Secondary | ICD-10-CM | POA: Diagnosis not present

## 2019-09-16 DIAGNOSIS — L209 Atopic dermatitis, unspecified: Secondary | ICD-10-CM | POA: Diagnosis not present

## 2019-09-16 DIAGNOSIS — Z79899 Other long term (current) drug therapy: Secondary | ICD-10-CM | POA: Diagnosis not present

## 2019-09-16 DIAGNOSIS — L281 Prurigo nodularis: Secondary | ICD-10-CM | POA: Diagnosis not present

## 2019-09-19 ENCOUNTER — Other Ambulatory Visit: Payer: Self-pay

## 2019-09-19 ENCOUNTER — Encounter: Payer: Self-pay | Admitting: Internal Medicine

## 2019-09-19 ENCOUNTER — Ambulatory Visit (INDEPENDENT_AMBULATORY_CARE_PROVIDER_SITE_OTHER): Payer: Medicare Other | Admitting: Internal Medicine

## 2019-09-19 VITALS — BP 118/75 | HR 69 | Ht 71.0 in | Wt 233.4 lb

## 2019-09-19 DIAGNOSIS — I25119 Atherosclerotic heart disease of native coronary artery with unspecified angina pectoris: Secondary | ICD-10-CM

## 2019-09-19 DIAGNOSIS — Z789 Other specified health status: Secondary | ICD-10-CM | POA: Diagnosis not present

## 2019-09-19 DIAGNOSIS — E782 Mixed hyperlipidemia: Secondary | ICD-10-CM

## 2019-09-19 DIAGNOSIS — T466X5A Adverse effect of antihyperlipidemic and antiarteriosclerotic drugs, initial encounter: Secondary | ICD-10-CM

## 2019-09-19 DIAGNOSIS — M791 Myalgia, unspecified site: Secondary | ICD-10-CM

## 2019-09-19 NOTE — Patient Instructions (Signed)
Medication Instructions:  Dr. Debara Pickett recommends Repatha 124m/mL (PCSK9). This is an injectable cholesterol medication self-administered once every 14 days. This medication will need prior approval with your insurance company, which we will work on. If the medication is not approved initially, we may need to do an appeal with your insurance. We will keep you updated on this process.   If you need co-pay assistance, please look into the program at [email or web address] >> disease funds >> hypercholesterolemia. This is an online application or you can call to complete.     *If you need a refill on your cardiac medications before your next appointment, please call your pharmacy*  Lab Work: FASTING lab work today - lipid panel, LP(a) FASTING lab work in 3-4 months (before next lipid clinic appointment) If you have labs (blood work) drawn today and your tests are completely normal, you will receive your results only by: .Marland KitchenMyChart Message (if you have MyChart) OR . A paper copy in the mail If you have any lab test that is abnormal or we need to change your treatment, we will call you to review the results.  Testing/Procedures: NONE  Follow-Up: At [facility] you and your health needs are our priority.  As part of our continuing mission to provide you with exceptional heart care, we have created designated Provider Care Teams.  These Care Teams include your primary Cardiologist (physician) and Advanced Practice Providers (APPs -  Physician Assistants and Nurse Practitioners) who all work together to provide you with the care you need, when you need it.  Your next appointment:   3-4 month(s) - LIPID CLINIC  The format for your next appointment:   Either In Person or Virtual  Provider:   KRaliegh IpCMaliHilty, MD  Other Instructions

## 2019-09-19 NOTE — Progress Notes (Signed)
LIPID CLINIC CONSULT NOTE  Chief Complaint:  Manage dyslipidemia  Primary Care Physician: [name], MD  Primary Cardiologist:  Rozann Lesches, MD  HPI:  Richard Webb is a 74 y.o. male who is being seen today for the evaluation of dyslipidemia at the request of Satira Sark, MD.  This is a pleasant 74 year old male who is currently referred for management of dyslipidemia.  He has a history of coronary artery disease [place], he also has hypertension, dyslipidemia, pulmonary hypertension, sarcoidosis and obstructive sleep apnea.  He has been on longstanding statin therapy however has had recurrent intolerance.  In the past has had myalgias with simvastatin, rosuvastatin and most recently pravastatin.  He has had reasonable control of his cholesterol in the past, most recently his lipid showed total cholesterol 153, triglycerides 128, HDL 47 and LDL 80.  This last lipid profile was in August 2020.  He was referred for evaluation of possible PCSK9 inhibitor therapy.  We discussed that at some length today.  He is familiar with injections since he uses Dupixent.  Currently he is on ezetimibe which she was taking when he had his prior lipid profile.  His target LDL is less than 70 and he remains above that.  PMHx:  Past Medical History:  Diagnosis Date  . Arthritis   . Asthma   . Coronary atherosclerosis of native coronary artery    a. DES x 3 RCA and DES mCx 10/11 - [place] - with residual 50-70% stenosis in the distal Cx in AV groove, 70% distal LAD beyond apex, 70% small D1. b. 12/2017: similar results with multivessel CAD. Patent stents. Medical management recommended.   Marland Kitchen GERD (gastroesophageal reflux disease)   . History of kidney stones   . Mixed hyperlipidemia   . Obstructive sleep apnea   . Pulmonary hypertension (HCC)    PASP 50 mmHg  . Restless leg syndrome   . Sarcoidosis   . Statin intolerance   . Ulcerative colitis [facility])       Past Surgical History:  Procedure Laterality Date  . Ankle fracture repair Left 1985  . CARDIAC CATHETERIZATION     stents x4  . CHOLECYSTECTOMY  1970  . CYSTOSCOPY WITH INSERTION OF UROLIFT N/A 08/27/2017   Procedure: CYSTOSCOPY WITH INSERTION OF UROLIFT;  Surgeon: Cleon Gustin, MD;  Location: [facility];  Service: Urology;  Laterality: N/A;  . LAPAROSCOPIC APPENDECTOMY  2013  . LEFT HEART CATH AND CORONARY ANGIOGRAPHY N/A 01/02/2018   Procedure: LEFT HEART CATH AND CORONARY ANGIOGRAPHY;  Surgeon: Troy Sine, MD;  Location: [facility];  Service: Cardiovascular;  Laterality: N/A;  . Right total knee replacement Bilateral 2005    FAMHx:  Family History  Problem Relation Age of Onset  . Colon cancer Mother   . CAD Father     SOCHx:   reports that he has never smoked. He has never used smokeless tobacco. He reports that he does not drink alcohol or use drugs.  ALLERGIES:  Allergies  Allergen Reactions  . Bee Venom Anaphylaxis  . Statins     Myalgias - Simvastatin and Rosuvastatin    ROS: Pertinent items noted in HPI and remainder of comprehensive ROS otherwise negative.  HOME MEDS: Current Outpatient Medications on File Prior to Visit  Medication Sig Dispense Refill  . amLODipine (NORVASC) 10 MG tablet Take 1 tablet (10 mg total) by mouth daily. 90 tablet 1  . aspirin EC 81  MG tablet Take 162 mg by mouth daily.    Marland Kitchen BREO ELLIPTA 200-25 MCG/INH AEPB Inhale 1 Inhaler into the lungs daily.  3  . carbidopa-levodopa (SINEMET IR) 10-100 MG per tablet Take 1 tablet by mouth at bedtime as needed (restless legs).     . cetirizine (ZYRTEC) 10 MG tablet Take 10 mg by mouth daily.    . Cyanocobalamin (B-12) 1000 MCG/ML KIT Inject 1,000 mcg as directed every 30 (thirty) days.    . Dupilumab (DUPIXENT [place]) Inject into the skin every 14 (fourteen) days.    Marland Kitchen EPINEPHrine (EPIPEN JR) 0.15 MG/0.3ML injection Inject 0.15 mg into the muscle as needed for anaphylaxis.     Marland Kitchen  ezetimibe (ZETIA) 10 MG tablet Take 1 tablet (10 mg total) by mouth daily. 90 tablet 1  . HYDROcodone-acetaminophen (NORCO/VICODIN) 5-325 MG tablet Take 1 tablet by mouth every 8 (eight) hours as needed for moderate pain.     . hydrocortisone cream 1 % Apply 1 application topically 2 (two) times daily as needed for itching (psoriasis).    . isosorbide mononitrate (IMDUR) 30 MG 24 hr tablet Take 1 tablet (30 mg total) by mouth daily. 90 tablet 1  . losartan (COZAAR) 50 MG tablet Take 1 tablet (50 mg total) by mouth daily. 90 tablet 3  . meloxicam (MOBIC) 15 MG tablet Take 15 mg by mouth daily.    . metoprolol tartrate (LOPRESSOR) 25 MG tablet Take half tablet (12.40m) by mouth twice daily. 90 tablet 1  . montelukast (SINGULAIR) 10 MG tablet Take 10 mg by mouth at bedtime.    . nitroGLYCERIN (NITROSTAT) 0.4 MG SL tablet Place 1 tablet (0.4 mg total) under the tongue every 5 (five) minutes x 3 doses as needed for chest pain (if no relief after 3rd dose, proceed to the ED for an evaluation or call 911). 90 tablet 3  . NON FORMULARY CPAP UAD    . oxymetazoline (AFRIN) 0.05 % nasal spray Place 1 spray into both nostrils at bedtime.    . Probiotic Product (PROBIOTIC COMPLEX ACIDOPHILUS) CAPS Take 1 capsule by mouth daily.    .Marland KitchensulfaSALAzine (AZULFIDINE) 500 MG tablet Take 1,000 mg by mouth 2 (two) times daily.    . temazepam (RESTORIL) 30 MG capsule Take 30 mg by mouth at bedtime as needed for sleep.     . Vitamin D, Ergocalciferol, (DRISDOL) 50000 UNITS CAPS capsule Take 50,000 Units by mouth every 7 (seven) days. fridays     No current facility-administered medications on file prior to visit.    LABS/IMAGING: No results found for this or any previous visit (from the past 48 hour(s)). No results found.  LIPID PANEL:    Component Value Date/Time   CHOL 167 10/23/2017 0857   TRIG 87 10/23/2017 0857   HDL 46 10/23/2017 0857   CHOLHDL 3.6 10/23/2017 0857   VLDL 17 10/23/2017 0857   LDLCALC 104  (H) 10/23/2017 0857    WEIGHTS: Wt Readings from Last 3 Encounters:  09/19/19 233 lb 6.4 oz (105.9 kg)  07/21/19 227 lb (103 kg)  03/11/19 229 lb 3.2 oz (104 kg)    VITALS: BP 118/75   Pulse 69   Ht 5' 11"  (1.803 m)   Wt 233 lb 6.4 oz (105.9 kg)   SpO2 96%   BMI 32.55 kg/m   EXAM: General appearance: alert, no distress and mildly obese Neck: no JVD, supple, symmetrical, trachea midline and thyroid not enlarged, symmetric, no tenderness/mass/nodules Lungs: clear to auscultation  bilaterally Heart: regular rate and rhythm, S1, S2 normal, no murmur, click, rub or gallop Abdomen: soft, non-tender; bowel sounds normal; no masses,  no organomegaly Extremities: extremities normal, atraumatic, no cyanosis or edema Pulses: 2+ and symmetric Skin: Skin color, texture, turgor normal. No rashes or lesions Neurologic: Grossly normal Psych: Pleasant  EKG: Deferred  ASSESSMENT: 1. Mixed dyslipidemia, goal LDL <70 2. CAD with prior PCI 3. Statin intolerance - myalgias, on zetia  PLAN: 1.   Mr. Lois Huxley has a mixed dyslipidemia with goal LDL less than 70 given his history of coronary artery disease and prior PCI.  Unfortunately has been intolerant of 3 different statins including high potency statin and his LDL cholesterol on ezetimibe remains above target less than 70.  He is a good candidate for PCSK9 inhibitor and I am recommending Repatha 140 mg every 2 weeks.  We discussed usage of the medication today.  He has no further questions about it.  We will obtain prior authorization and likely if he starts the medication repeat lipids in 3 to 4 months with follow-up at that time.  Thanks again for the kind referral.  Pixie Casino, MD, FACC, [place] Director of the [facility]id [facility]  Cardiovascular Risk Reduction Clinic Diplomate of the American Board of Clinical Lipidology Attending Cardiologist  Direct Dial: [phone]  Fax:  [phone]  Website:  [email or web address] Gennavieve Huq 09/19/2019, 4:13 PM

## 2019-09-20 LAB — LIPID PANEL
Chol/HDL Ratio: 3.4 ratio (ref 0.0–5.0)
Cholesterol, Total: 152 mg/dL (ref 100–199)
HDL: 45 mg/dL (ref >39)
LDL Chol Calc (NIH): 90 mg/dL (ref 0–99)
Triglycerides: 88 mg/dL (ref 0–149)
VLDL Cholesterol Cal: 17 mg/dL (ref 5–40)

## 2019-09-20 LAB — LIPOPROTEIN A (LPA): Lipoprotein (a): 82.2 nmol/L — ABNORMAL HIGH (ref <75.0)

## 2019-09-22 ENCOUNTER — Telehealth: Payer: Self-pay | Admitting: Internal Medicine

## 2019-09-22 NOTE — Telephone Encounter (Signed)
-----   Message from Fidel Levy, RN sent at 09/19/2019  9:23 AM EST ----- Regarding: PA - repatha ID: [id number] BIN: [id number] PCN: [id number] RxGrp: [id number]

## 2019-09-22 NOTE — Telephone Encounter (Signed)
Approved until 03/21/2020 Patient notified via [place]

## 2019-09-22 NOTE — Telephone Encounter (Signed)
PA for Repatha submitted via [facility] (Key: [id number])

## 2019-09-24 ENCOUNTER — Other Ambulatory Visit: Payer: Self-pay | Admitting: Cardiology

## 2019-10-01 ENCOUNTER — Other Ambulatory Visit: Payer: Self-pay | Admitting: Cardiology

## 2019-10-02 ENCOUNTER — Telehealth: Payer: Self-pay | Admitting: Internal Medicine

## 2019-10-02 MED ORDER — REPATHA SURECLICK 140 MG/ML ~~LOC~~ SOAJ: 1.0000 | SUBCUTANEOUS | 3 refills | Status: DC

## 2019-10-02 NOTE — Telephone Encounter (Signed)
Pt c/o medication issue:  1. Name of Medication: Repatha   2. How are you currently taking this medication (dosage and times per day)? N/A  3. Are you having a reaction (difficulty breathing--STAT)? no  4. What is your medication issue? Patient requesting to speak with Eliezer Lofts because he wants to know his co-pay for the medication. Per the last telephone note I advised him that the pharmacy would probably be the ones to let him know that. And if he needed co-pay assistance he could apply for assistance. He stated that he did not need assistance and still requested to speak with [place].

## 2019-10-02 NOTE — Telephone Encounter (Signed)
Spoke with patient. He would like [facility] as his co-pay is $120/90 days. Advised him to apply for [place] in the vent he goes into the donut hole. He voiced understanding.

## 2019-10-06 DIAGNOSIS — M5416 Radiculopathy, lumbar region: Secondary | ICD-10-CM | POA: Diagnosis not present

## 2019-10-06 DIAGNOSIS — M5412 Radiculopathy, cervical region: Secondary | ICD-10-CM | POA: Diagnosis not present

## 2019-10-23 DIAGNOSIS — Z23 Encounter for immunization: Secondary | ICD-10-CM | POA: Diagnosis not present

## 2019-11-05 DIAGNOSIS — M79642 Pain in left hand: Secondary | ICD-10-CM | POA: Diagnosis not present

## 2019-11-05 DIAGNOSIS — M65352 Trigger finger, left little finger: Secondary | ICD-10-CM | POA: Diagnosis not present

## 2019-11-05 DIAGNOSIS — M180 Bilateral primary osteoarthritis of first carpometacarpal joints: Secondary | ICD-10-CM | POA: Diagnosis not present

## 2019-11-05 DIAGNOSIS — M79641 Pain in right hand: Secondary | ICD-10-CM | POA: Diagnosis not present

## 2019-11-05 DIAGNOSIS — M19041 Primary osteoarthritis, right hand: Secondary | ICD-10-CM | POA: Diagnosis not present

## 2019-11-05 DIAGNOSIS — M19042 Primary osteoarthritis, left hand: Secondary | ICD-10-CM | POA: Diagnosis not present

## 2019-11-06 DIAGNOSIS — Z789 Other specified health status: Secondary | ICD-10-CM | POA: Diagnosis not present

## 2019-11-06 DIAGNOSIS — G4733 Obstructive sleep apnea (adult) (pediatric): Secondary | ICD-10-CM | POA: Diagnosis not present

## 2019-11-06 DIAGNOSIS — L4050 Arthropathic psoriasis, unspecified: Secondary | ICD-10-CM | POA: Diagnosis not present

## 2019-11-06 DIAGNOSIS — K5190 Ulcerative colitis, unspecified, without complications: Secondary | ICD-10-CM | POA: Diagnosis not present

## 2019-11-06 DIAGNOSIS — I10 Essential (primary) hypertension: Secondary | ICD-10-CM | POA: Diagnosis not present

## 2019-11-06 DIAGNOSIS — Z6833 Body mass index (BMI) 33.0-33.9, adult: Secondary | ICD-10-CM | POA: Diagnosis not present

## 2019-11-06 DIAGNOSIS — E7800 Pure hypercholesterolemia, unspecified: Secondary | ICD-10-CM | POA: Diagnosis not present

## 2019-11-06 DIAGNOSIS — Z299 Encounter for prophylactic measures, unspecified: Secondary | ICD-10-CM | POA: Diagnosis not present

## 2019-11-19 ENCOUNTER — Other Ambulatory Visit: Payer: Self-pay | Admitting: Internal Medicine

## 2019-11-19 DIAGNOSIS — E782 Mixed hyperlipidemia: Secondary | ICD-10-CM

## 2019-11-19 DIAGNOSIS — T466X5A Adverse effect of antihyperlipidemic and antiarteriosclerotic drugs, initial encounter: Secondary | ICD-10-CM

## 2019-11-19 DIAGNOSIS — M791 Myalgia, unspecified site: Secondary | ICD-10-CM

## 2019-11-19 DIAGNOSIS — I25119 Atherosclerotic heart disease of native coronary artery with unspecified angina pectoris: Secondary | ICD-10-CM

## 2019-11-24 ENCOUNTER — Ambulatory Visit (INDEPENDENT_AMBULATORY_CARE_PROVIDER_SITE_OTHER): Payer: Medicare Other | Admitting: Cardiology

## 2019-11-24 ENCOUNTER — Other Ambulatory Visit: Payer: Self-pay

## 2019-11-24 ENCOUNTER — Encounter: Payer: Self-pay | Admitting: Cardiology

## 2019-11-24 VITALS — BP 118/64 | HR 59 | Ht 71.5 in | Wt 235.0 lb

## 2019-11-24 DIAGNOSIS — E782 Mixed hyperlipidemia: Secondary | ICD-10-CM

## 2019-11-24 DIAGNOSIS — I10 Essential (primary) hypertension: Secondary | ICD-10-CM | POA: Diagnosis not present

## 2019-11-24 DIAGNOSIS — I25119 Atherosclerotic heart disease of native coronary artery with unspecified angina pectoris: Secondary | ICD-10-CM

## 2019-11-24 NOTE — Patient Instructions (Signed)

## 2019-11-24 NOTE — Progress Notes (Signed)
Cardiology Office Note  Date: 11/24/2019   ID: Richard Webb, DOB January 28, 1946, MRN [id number]  PCP:  [name], MD  Cardiologist:  Rozann Lesches, MD Electrophysiologist:  None   Chief Complaint  Patient presents with  . Cardiac follow-up    History of Present Illness: Richard Webb is a 74 y.o. male last seen in November 2020 by Mr. Leonides Sake NP.  He presents for a routine visit.  Fortunately, no progressive angina symptoms on current regimen.  He tells me that his wife has had interval health changes, four surgeries over the last several months, recently knee replacement.  He has been her primary caregiver, also doing all the cooking.  Antianginal regimen was titrated with increase in Norvasc in addition of Imdur.  Blood pressure and heart rate are well controlled today.  The remainder of his regimen includes aspirin, Zetia, Cozaar, and Lopressor.  He had an evaluation with Dr. Debara Pickett in the lipid clinic back in January and has started on Repatha.  He is tolerating this well so far and has follow-up scheduled for May.  Past Medical History:  Diagnosis Date  . Arthritis   . Asthma   . Coronary atherosclerosis of native coronary artery    a. DES x 3 RCA and DES mCx 10/11 - [place] - with residual 50-70% stenosis in the distal Cx in AV groove, 70% distal LAD beyond apex, 70% small D1. b. 12/2017: similar results with multivessel CAD. Patent stents. Medical management recommended.   Marland Kitchen GERD (gastroesophageal reflux disease)   . History of kidney stones   . Mixed hyperlipidemia   . Obstructive sleep apnea   . Pulmonary hypertension (HCC)    PASP 50 mmHg  . Restless leg syndrome   . Sarcoidosis   . Statin intolerance   . Ulcerative colitis [facility])     Past Surgical History:  Procedure Laterality Date  . Ankle fracture repair Left 1985  . CARDIAC CATHETERIZATION     stents x4  . CHOLECYSTECTOMY  1970  . CYSTOSCOPY WITH INSERTION OF UROLIFT N/A 08/27/2017   Procedure:  CYSTOSCOPY WITH INSERTION OF UROLIFT;  Surgeon: Richard Gustin, MD;  Location: [facility];  Service: Urology;  Laterality: N/A;  . LAPAROSCOPIC APPENDECTOMY  2013  . LEFT HEART CATH AND CORONARY ANGIOGRAPHY N/A 01/02/2018   Procedure: LEFT HEART CATH AND CORONARY ANGIOGRAPHY;  Surgeon: Richard Sine, MD;  Location: [facility];  Service: Cardiovascular;  Laterality: N/A;  . Right total knee replacement Bilateral 2005    Current Outpatient Medications  Medication Sig Dispense Refill  . aspirin EC 81 MG tablet Take 162 mg by mouth daily.    Marland Kitchen BREO ELLIPTA 200-25 MCG/INH AEPB Inhale 1 Inhaler into the lungs daily.  3  . carbidopa-levodopa (SINEMET IR) 10-100 MG per tablet Take 1 tablet by mouth at bedtime as needed (restless legs).     . cetirizine (ZYRTEC) 10 MG tablet Take 10 mg by mouth daily.    . Cyanocobalamin (B-12) 1000 MCG/ML KIT Inject 1,000 mcg as directed every 30 (thirty) days.    . Dupilumab (DUPIXENT [place]) Inject into the skin every 14 (fourteen) days.    Marland Kitchen EPINEPHrine (EPIPEN JR) 0.15 MG/0.3ML injection Inject 0.15 mg into the muscle as needed for anaphylaxis.     . Evolocumab (REPATHA SURECLICK) 308 MG/ML SOAJ Inject 1 Dose into the skin every 14 (fourteen) days. 6 pen 3  . ezetimibe (ZETIA) 10 MG tablet TAKE 1 TABLET BY MOUTH  DAILY 90  tablet 3  . HYDROcodone-acetaminophen (NORCO/VICODIN) 5-325 MG tablet Take 1 tablet by mouth every 8 (eight) hours as needed for moderate pain.     . hydrocortisone cream 1 % Apply 1 application topically 2 (two) times daily as needed for itching (psoriasis).    . isosorbide mononitrate (IMDUR) 30 MG 24 hr tablet Take 1 tablet (30 mg total) by mouth daily. 90 tablet 1  . losartan (COZAAR) 50 MG tablet Take 1 tablet (50 mg total) by mouth daily. 90 tablet 3  . meloxicam (MOBIC) 15 MG tablet Take 15 mg by mouth daily.    . mesalamine (APRISO) 0.375 g 24 hr capsule Take 375 mg by mouth daily.    . metoprolol tartrate (LOPRESSOR) 25 MG tablet  TAKE ONE-HALF TABLET BY  MOUTH TWICE DAILY 90 tablet 3  . montelukast (SINGULAIR) 10 MG tablet Take 10 mg by mouth at bedtime.    . nitroGLYCERIN (NITROSTAT) 0.4 MG SL tablet Place 1 tablet (0.4 mg total) under the tongue every 5 (five) minutes x 3 doses as needed for chest pain (if no relief after 3rd dose, proceed to the ED for an evaluation or call 911). 90 tablet 3  . NON FORMULARY CPAP UAD    . oxymetazoline (AFRIN) 0.05 % nasal spray Place 1 spray into both nostrils at bedtime.    . temazepam (RESTORIL) 30 MG capsule Take 30 mg by mouth at bedtime as needed for sleep.     . Vitamin D, Ergocalciferol, (DRISDOL) 50000 UNITS CAPS capsule Take 50,000 Units by mouth every 7 (seven) days. fridays    . amLODipine (NORVASC) 10 MG tablet Take 1 tablet (10 mg total) by mouth daily. 90 tablet 1   No current facility-administered medications for this visit.   Allergies:  Bee venom and Statins   ROS:   No palpitations or syncope.  Physical Exam: VS:  BP 118/64   Pulse (!) 59   Ht 5' 11.5" (1.816 m)   Wt 235 lb (106.6 kg)   SpO2 98%   BMI 32.32 kg/m , BMI Body mass index is 32.32 kg/m.  Wt Readings from Last 3 Encounters:  11/24/19 235 lb (106.6 kg)  09/19/19 233 lb 6.4 oz (105.9 kg)  07/21/19 227 lb (103 kg)    General: Patient appears comfortable at rest. HEENT: Conjunctiva and lids normal, wearing a mask. Neck: Supple, no elevated JVP or carotid bruits, no thyromegaly. Lungs: Clear to auscultation, nonlabored breathing at rest. Cardiac: Regular rate and rhythm, no S3 or significant systolic murmur, no pericardial rub. Abdomen: Soft, bowel sounds present. Extremities: No pitting edema, distal pulses 2+.  ECG:  An ECG dated 07/21/2019 was personally reviewed today and demonstrated:  Sinus rhythm with prolonged PR interval.  Recent Labwork:    Component Value Date/Time   CHOL 152 09/19/2019 0948   TRIG 88 09/19/2019 0948   HDL 45 09/19/2019 0948   CHOLHDL 3.4 09/19/2019 0948    CHOLHDL 3.6 10/23/2017 0857   VLDL 17 10/23/2017 0857   LDLCALC 90 09/19/2019 0948    Other Studies Reviewed Today:  Cardiac catheterization 01/02/2018:  Previously placed Dist RCA stent (unknown type) is widely patent.  Previously placed Ost RCA to Prox RCA stent (unknown type) is widely patent.  Prox RCA to Mid RCA lesion is 60% stenosed.  Mid RCA lesion is 70% stenosed.  Acute Mrg lesion is 95% stenosed.  Post Atrio lesion is 60% stenosed.  Ost RPDA lesion is 70% stenosed.  Previously placed Prox Cx  stent (unknown type) is widely patent.  Mid Cx to Dist Cx lesion is 60% stenosed.  Ost 3rd Mrg to 3rd Mrg lesion is 50% stenosed.  Ost 2nd Diag to 2nd Diag lesion is 70% stenosed.  Mid LAD lesion is 70% stenosed.  Dist LAD lesion is 30% stenosed.  Prox LAD lesion is 55% stenosed.  The left ventricular ejection fraction is greater than 65% by visual estimate.  There is hyperdynamic left ventricular systolic function.  LV end diastolic pressure is normal.   Hyperdynamic LV function with a "spade-like ventricle "with an EF of at least 75%.  Multivessel CAD with 55% proximal LAD stenosis after the first small first diagonal vessel, with diffuse 70% bifurcation stenoses involving the mid LAD and second diagonal vessel and 30% distal apical diffuse stenoses; patent left circumflex stent in the AV groove between the first small marginal branch and second large marginal vessel.  There is diffuse 50 to 60% stenoses in the distal AV groove circumflex and bifurcating OM 3 vessel; large dominant RCA with patent proximal stent, diffuse 60 to 70% mid stenoses with 95% ostial stenosis and a small RV marginal branch and patent distal RCA stent proximal to the PDA takeoff with 60 to 70% stenoses at the origin of the PDA and continuation branch beyond the PDA vessel.  Echocardiogram 10/23/2017: Study Conclusions   - Left ventricle: The cavity size was normal. Wall thickness was    increased increased in a pattern of mild to moderate LVH.  Systolic function was normal. The estimated ejection fraction was  in the range of 55% to 60%. Wall motion was normal; there were no  regional wall motion abnormalities. Doppler parameters are  consistent with abnormal left ventricular relaxation (grade 1  diastolic dysfunction).  - Aortic valve: Mildly calcified annulus. Trileaflet; mildly  thickened leaflets. Valve area (VTI): 2.54 cm^2. Valve area  (Vmax): 2.26 cm^2.  - Mitral valve: Mildly calcified annulus. Normal thickness leaflets  .  - Technically adequate study.   Assessment and Plan:  1.  Multivessel CAD status post previous DES interventions to the RCA and obtuse marginal with most recent coronary anatomy outlined above.  Plan is to continue medical therapy, he is doing well without progressive angina at this point.  Continue aspirin, Norvasc, Imdur, Lopressor, and Cozaar.  2.  Mixed hyperlipidemia with history of statin intolerance.  Currently doing well on Repatha and Zetia.  Keep follow-up in the lipid clinic for May.  3.  Essential hypertension, blood pressure is well controlled today.  No changes were made.  Medication Adjustments/Labs and Tests Ordered: Current medicines are reviewed at length with the patient today.  Concerns regarding medicines are outlined above.   Tests Ordered: No orders of the defined types were placed in this encounter.   Medication Changes: No orders of the defined types were placed in this encounter.   Disposition:  Follow up 1 year in the [place] office.  Signed, Satira Sark, MD, [facility] 11/24/2019 1:21 PM    [facility], [place], [place] [address] Phone: [phone]; Fax: [phone]

## 2019-11-28 DIAGNOSIS — Z23 Encounter for immunization: Secondary | ICD-10-CM | POA: Diagnosis not present

## 2019-12-02 ENCOUNTER — Other Ambulatory Visit: Payer: Self-pay | Admitting: Cardiology

## 2019-12-02 DIAGNOSIS — I200 Unstable angina: Secondary | ICD-10-CM

## 2019-12-12 DIAGNOSIS — T466X5A Adverse effect of antihyperlipidemic and antiarteriosclerotic drugs, initial encounter: Secondary | ICD-10-CM | POA: Diagnosis not present

## 2019-12-12 DIAGNOSIS — E782 Mixed hyperlipidemia: Secondary | ICD-10-CM | POA: Diagnosis not present

## 2019-12-12 DIAGNOSIS — M791 Myalgia, unspecified site: Secondary | ICD-10-CM | POA: Diagnosis not present

## 2019-12-12 DIAGNOSIS — I25119 Atherosclerotic heart disease of native coronary artery with unspecified angina pectoris: Secondary | ICD-10-CM | POA: Diagnosis not present

## 2019-12-13 LAB — LIPID PANEL
Chol/HDL Ratio: 1.9 ratio (ref 0.0–5.0)
Cholesterol, Total: 78 mg/dL — ABNORMAL LOW (ref 100–199)
HDL: 42 mg/dL (ref >39)
LDL Chol Calc (NIH): 19 mg/dL (ref 0–99)
Triglycerides: 82 mg/dL (ref 0–149)
VLDL Cholesterol Cal: 17 mg/dL (ref 5–40)

## 2019-12-17 DIAGNOSIS — I2510 Atherosclerotic heart disease of native coronary artery without angina pectoris: Secondary | ICD-10-CM | POA: Diagnosis not present

## 2019-12-17 DIAGNOSIS — I10 Essential (primary) hypertension: Secondary | ICD-10-CM | POA: Diagnosis not present

## 2019-12-19 ENCOUNTER — Ambulatory Visit (INDEPENDENT_AMBULATORY_CARE_PROVIDER_SITE_OTHER): Payer: Medicare Other | Admitting: Internal Medicine

## 2019-12-19 ENCOUNTER — Encounter: Payer: Self-pay | Admitting: Internal Medicine

## 2019-12-19 ENCOUNTER — Other Ambulatory Visit: Payer: Self-pay

## 2019-12-19 VITALS — BP 114/67 | HR 55 | Ht 71.0 in | Wt 236.0 lb

## 2019-12-19 DIAGNOSIS — I200 Unstable angina: Secondary | ICD-10-CM

## 2019-12-19 DIAGNOSIS — I25119 Atherosclerotic heart disease of native coronary artery with unspecified angina pectoris: Secondary | ICD-10-CM

## 2019-12-19 DIAGNOSIS — E782 Mixed hyperlipidemia: Secondary | ICD-10-CM

## 2019-12-19 DIAGNOSIS — Z789 Other specified health status: Secondary | ICD-10-CM | POA: Diagnosis not present

## 2019-12-19 DIAGNOSIS — M791 Myalgia, unspecified site: Secondary | ICD-10-CM | POA: Diagnosis not present

## 2019-12-19 DIAGNOSIS — T466X5A Adverse effect of antihyperlipidemic and antiarteriosclerotic drugs, initial encounter: Secondary | ICD-10-CM

## 2019-12-19 NOTE — Progress Notes (Signed)
LIPID CLINIC CONSULT NOTE  Chief Complaint:  Manage dyslipidemia  Primary Care Physician: [name], MD  Primary Cardiologist:  Rozann Lesches, MD  HPI:  Richard Webb is a 74 y.o. male who is being seen today for the evaluation of dyslipidemia at the request of [name], MD.  This is a pleasant 74 year old male who is currently referred for management of dyslipidemia.  He has a history of coronary artery disease with prior stents back in [place] in [place], he also has hypertension, dyslipidemia, pulmonary hypertension, sarcoidosis and obstructive sleep apnea.  He has been on longstanding statin therapy however has had recurrent intolerance.  In the past has had myalgias with simvastatin, rosuvastatin and most recently pravastatin.  He has had reasonable control of his cholesterol in the past, most recently his lipid showed total cholesterol 153, triglycerides 128, HDL 47 and LDL 80.  This last lipid profile was in August 2020.  He was referred for evaluation of possible PCSK9 inhibitor therapy.  We discussed that at some length today.  He is familiar with injections since he uses Dupixent.  Currently he is on ezetimibe which she was taking when he had his prior lipid profile.  His target LDL is less than 70 and he remains above that.  12/19/19  Richard Webb returns today for follow-up.  Overall he is doing well.  He is tolerating Repatha without any significant side effects.  He has had an expected marked reduction in LDL cholesterol from 90-19.  Total cholesterol 78, triglycerides 82 and HDL of 42.  Based on the significant reduction, at this point I recommend stopping his ezetimibe.  We did provide some financial assistance information.  PMHx:  Past Medical History:  Diagnosis Date  . Arthritis   . Asthma   . Coronary atherosclerosis of native coronary artery    a. DES x 3 RCA and DES mCx 10/11 - [place] - with residual 50-70% stenosis in the distal Cx in AV groove, 70%  distal LAD beyond apex, 70% small D1. b. 12/2017: similar results with multivessel CAD. Patent stents. Medical management recommended.   Marland Kitchen GERD (gastroesophageal reflux disease)   . History of kidney stones   . Mixed hyperlipidemia   . Obstructive sleep apnea   . Pulmonary hypertension (HCC)    PASP 50 mmHg  . Restless leg syndrome   . Sarcoidosis   . Statin intolerance   . Ulcerative colitis [facility])     Past Surgical History:  Procedure Laterality Date  . Ankle fracture repair Left 1985  . CARDIAC CATHETERIZATION     stents x4  . CHOLECYSTECTOMY  1970  . CYSTOSCOPY WITH INSERTION OF UROLIFT N/A 08/27/2017   Procedure: CYSTOSCOPY WITH INSERTION OF UROLIFT;  Surgeon: Cleon Gustin, MD;  Location: [facility];  Service: Urology;  Laterality: N/A;  . LAPAROSCOPIC APPENDECTOMY  2013  . LEFT HEART CATH AND CORONARY ANGIOGRAPHY N/A 01/02/2018   Procedure: LEFT HEART CATH AND CORONARY ANGIOGRAPHY;  Surgeon: Troy Sine, MD;  Location: [facility];  Service: Cardiovascular;  Laterality: N/A;  . Right total knee replacement Bilateral 2005    FAMHx:  Family History  Problem Relation Age of Onset  . Colon cancer Mother   . CAD Father     SOCHx:   reports that he has never smoked. He has never used smokeless tobacco. He reports that he does not drink alcohol or use drugs.  ALLERGIES:  Allergies  Allergen Reactions  . Bee Venom Anaphylaxis  .  Statins     Myalgias - Simvastatin and Rosuvastatin    ROS: Pertinent items noted in HPI and remainder of comprehensive ROS otherwise negative.  HOME MEDS: Current Outpatient Medications on File Prior to Visit  Medication Sig Dispense Refill  . amLODipine (NORVASC) 10 MG tablet TAKE 1 TABLET BY MOUTH  DAILY 90 tablet 3  . aspirin EC 81 MG tablet Take 162 mg by mouth daily.    Marland Kitchen BREO ELLIPTA 200-25 MCG/INH AEPB Inhale 1 Inhaler into the lungs daily.  3  . carbidopa-levodopa (SINEMET IR) 10-100 MG per tablet Take 1 tablet by mouth  at bedtime as needed (restless legs).     . cetirizine (ZYRTEC) 10 MG tablet Take 10 mg by mouth daily.    . Cyanocobalamin (B-12) 1000 MCG/ML KIT Inject 1,000 mcg as directed every 30 (thirty) days.    . Dupilumab (DUPIXENT [place]) Inject into the skin every 14 (fourteen) days.    Marland Kitchen EPINEPHrine (EPIPEN JR) 0.15 MG/0.3ML injection Inject 0.15 mg into the muscle as needed for anaphylaxis.     . Evolocumab (REPATHA SURECLICK) 010 MG/ML SOAJ Inject 1 Dose into the skin every 14 (fourteen) days. 6 pen 3  . ezetimibe (ZETIA) 10 MG tablet TAKE 1 TABLET BY MOUTH  DAILY 90 tablet 3  . HYDROcodone-acetaminophen (NORCO/VICODIN) 5-325 MG tablet Take 1 tablet by mouth every 8 (eight) hours as needed for moderate pain.     . hydrocortisone cream 1 % Apply 1 application topically 2 (two) times daily as needed for itching (psoriasis).    . isosorbide mononitrate (IMDUR) 30 MG 24 hr tablet Take 1 tablet (30 mg total) by mouth daily. 90 tablet 1  . losartan (COZAAR) 50 MG tablet Take 1 tablet (50 mg total) by mouth daily. 90 tablet 3  . meloxicam (MOBIC) 15 MG tablet Take 15 mg by mouth daily.    . mesalamine (APRISO) 0.375 g 24 hr capsule Take 375 mg by mouth daily.    . metoprolol tartrate (LOPRESSOR) 25 MG tablet TAKE ONE-HALF TABLET BY  MOUTH TWICE DAILY 90 tablet 3  . montelukast (SINGULAIR) 10 MG tablet Take 10 mg by mouth at bedtime.    . nitroGLYCERIN (NITROSTAT) 0.4 MG SL tablet Place 1 tablet (0.4 mg total) under the tongue every 5 (five) minutes x 3 doses as needed for chest pain (if no relief after 3rd dose, proceed to the ED for an evaluation or call 911). 90 tablet 3  . NON FORMULARY CPAP UAD    . oxymetazoline (AFRIN) 0.05 % nasal spray Place 1 spray into both nostrils at bedtime.    . temazepam (RESTORIL) 30 MG capsule Take 30 mg by mouth at bedtime as needed for sleep.     . Vitamin D, Ergocalciferol, (DRISDOL) 50000 UNITS CAPS capsule Take 50,000 Units by mouth every 7 (seven) days. fridays     No  current facility-administered medications on file prior to visit.    LABS/IMAGING: No results found for this or any previous visit (from the past 48 hour(s)). No results found.  LIPID PANEL:    Component Value Date/Time   CHOL 78 (L) 12/12/2019 0910   TRIG 82 12/12/2019 0910   HDL 42 12/12/2019 0910   CHOLHDL 1.9 12/12/2019 0910   CHOLHDL 3.6 10/23/2017 0857   VLDL 17 10/23/2017 0857   LDLCALC 19 12/12/2019 0910    WEIGHTS: Wt Readings from Last 3 Encounters:  12/19/19 236 lb (107 kg)  11/24/19 235 lb (106.6 kg)  09/19/19  233 lb 6.4 oz (105.9 kg)    VITALS: BP 114/67   Pulse (!) 55   Ht _0  (1.803 m)   Wt 236 lb (107 kg)   SpO2 95%   BMI 32.92 kg/m   EXAM: Deferred  EKG: Deferred  ASSESSMENT: 1. Mixed dyslipidemia, goal LDL <70 2. CAD with prior PCI 3. Statin intolerance - myalgias, on zetia  PLAN: 1.   Richard Webb had a significant response on Repatha and at this point I feel like we could discontinue the Zetia which provides little additional cardiovascular benefit and he will still be well treated at target LDL less than 70.  Plan follow-up with me annually or sooner as necessary.  Pixie Casino, MD, [facility], [place] Director of the Advanced Lipid Disorders &  Cardiovascular Risk Reduction Clinic Diplomate of the American Board of Clinical Lipidology Attending Cardiologist  Direct Dial: [phone]  Fax: [phone]  Website:  [email or web address] Paolo 12/19/2019, 9:24 AM

## 2019-12-19 NOTE — Patient Instructions (Signed)
Medication Instructions:  STOP ezetimibe (zetia) Continue all other current medications  *If you need a refill on your cardiac medications before your next appointment, please call your pharmacy*   Lab Work: FASTING lab work in 1 year to check cholesterol before your next lipid clinic appointment   If you have labs (blood work) drawn today and your tests are completely normal, you will receive your results only by: Marland Kitchen MyChart Message (if you have MyChart) OR . A paper copy in the mail If you have any lab test that is abnormal or we need to change your treatment, we will call you to review the results.   Testing/Procedures: NONE   Follow-Up: At [facility], you and your health needs are our priority.  As part of our continuing mission to provide you with exceptional heart care, we have created designated Provider Care Teams.  These Care Teams include your primary Cardiologist (physician) and Advanced Practice Providers (APPs -  Physician Assistants and Nurse Practitioners) who all work together to provide you with the care you need, when you need it.  We recommend signing up for the patient portal called "MyChart".  Sign up information is provided on this After Visit Summary.  MyChart is used to connect with patients for Virtual Visits (Telemedicine).  Patients are able to view lab/test results, encounter notes, upcoming appointments, etc.  Non-urgent messages can be sent to your provider as well.   To learn more about what you can do with MyChart, go to [email or web address].    Your next appointment:   12 month(s)  The format for your next appointment:   Either In Person or Virtual  Provider:   Raliegh Ip Mali Hilty, MD   Other Instructions  [email or web address] >> disease funds >> hypercholesterolemia -- cut off is around $80,000/year for household of 2

## 2019-12-29 DIAGNOSIS — K5190 Ulcerative colitis, unspecified, without complications: Secondary | ICD-10-CM | POA: Diagnosis not present

## 2019-12-29 DIAGNOSIS — I10 Essential (primary) hypertension: Secondary | ICD-10-CM | POA: Diagnosis not present

## 2019-12-29 DIAGNOSIS — Z299 Encounter for prophylactic measures, unspecified: Secondary | ICD-10-CM | POA: Diagnosis not present

## 2019-12-29 DIAGNOSIS — G4700 Insomnia, unspecified: Secondary | ICD-10-CM | POA: Diagnosis not present

## 2019-12-29 DIAGNOSIS — E7800 Pure hypercholesterolemia, unspecified: Secondary | ICD-10-CM | POA: Diagnosis not present

## 2020-01-01 DIAGNOSIS — R1013 Epigastric pain: Secondary | ICD-10-CM | POA: Diagnosis not present

## 2020-01-01 DIAGNOSIS — R194 Change in bowel habit: Secondary | ICD-10-CM | POA: Diagnosis not present

## 2020-01-01 DIAGNOSIS — R1310 Dysphagia, unspecified: Secondary | ICD-10-CM | POA: Diagnosis not present

## 2020-01-01 DIAGNOSIS — K5150 Left sided colitis without complications: Secondary | ICD-10-CM | POA: Diagnosis not present

## 2020-01-05 DIAGNOSIS — M546 Pain in thoracic spine: Secondary | ICD-10-CM | POA: Diagnosis not present

## 2020-01-05 DIAGNOSIS — M5416 Radiculopathy, lumbar region: Secondary | ICD-10-CM | POA: Diagnosis not present

## 2020-01-12 DIAGNOSIS — K5150 Left sided colitis without complications: Secondary | ICD-10-CM | POA: Diagnosis not present

## 2020-01-12 DIAGNOSIS — R1013 Epigastric pain: Secondary | ICD-10-CM | POA: Diagnosis not present

## 2020-01-12 DIAGNOSIS — R194 Change in bowel habit: Secondary | ICD-10-CM | POA: Diagnosis not present

## 2020-01-15 NOTE — Telephone Encounter (Signed)
Spoke with patient Remaining application questions answered Application faxed

## 2020-01-16 DIAGNOSIS — M5416 Radiculopathy, lumbar region: Secondary | ICD-10-CM | POA: Diagnosis not present

## 2020-01-16 DIAGNOSIS — M545 Low back pain: Secondary | ICD-10-CM | POA: Diagnosis not present

## 2020-01-16 DIAGNOSIS — M546 Pain in thoracic spine: Secondary | ICD-10-CM | POA: Diagnosis not present

## 2020-01-21 NOTE — Telephone Encounter (Signed)
Patient notified via MyChart message that he was NOT approved for [employer]

## 2020-01-24 DIAGNOSIS — J9811 Atelectasis: Secondary | ICD-10-CM | POA: Diagnosis not present

## 2020-01-24 DIAGNOSIS — R0602 Shortness of breath: Secondary | ICD-10-CM | POA: Diagnosis not present

## 2020-01-24 DIAGNOSIS — R918 Other nonspecific abnormal finding of lung field: Secondary | ICD-10-CM | POA: Diagnosis not present

## 2020-01-24 DIAGNOSIS — D692 Other nonthrombocytopenic purpura: Secondary | ICD-10-CM | POA: Diagnosis not present

## 2020-01-24 DIAGNOSIS — R609 Edema, unspecified: Secondary | ICD-10-CM | POA: Diagnosis not present

## 2020-01-24 DIAGNOSIS — R600 Localized edema: Secondary | ICD-10-CM | POA: Diagnosis not present

## 2020-02-04 DIAGNOSIS — K2970 Gastritis, unspecified, without bleeding: Secondary | ICD-10-CM | POA: Diagnosis not present

## 2020-02-04 DIAGNOSIS — K269 Duodenal ulcer, unspecified as acute or chronic, without hemorrhage or perforation: Secondary | ICD-10-CM | POA: Diagnosis not present

## 2020-02-04 DIAGNOSIS — K449 Diaphragmatic hernia without obstruction or gangrene: Secondary | ICD-10-CM | POA: Diagnosis not present

## 2020-02-04 DIAGNOSIS — K219 Gastro-esophageal reflux disease without esophagitis: Secondary | ICD-10-CM | POA: Diagnosis not present

## 2020-02-04 DIAGNOSIS — K635 Polyp of colon: Secondary | ICD-10-CM | POA: Diagnosis not present

## 2020-02-04 DIAGNOSIS — J45909 Unspecified asthma, uncomplicated: Secondary | ICD-10-CM | POA: Diagnosis not present

## 2020-02-04 DIAGNOSIS — E669 Obesity, unspecified: Secondary | ICD-10-CM | POA: Diagnosis not present

## 2020-02-04 DIAGNOSIS — K5150 Left sided colitis without complications: Secondary | ICD-10-CM | POA: Diagnosis not present

## 2020-02-10 ENCOUNTER — Telehealth: Payer: Self-pay

## 2020-02-10 NOTE — Telephone Encounter (Signed)
Pt called requesting an earlier appt due to possible uti. Ua obtained and abt.perscribed per primary dr. Next available appt given and pt informed to call back if symptoms gets worse. Pt voiced understanding.

## 2020-02-14 DIAGNOSIS — N281 Cyst of kidney, acquired: Secondary | ICD-10-CM | POA: Diagnosis not present

## 2020-02-14 DIAGNOSIS — M25562 Pain in left knee: Secondary | ICD-10-CM | POA: Diagnosis not present

## 2020-02-14 DIAGNOSIS — K5792 Diverticulitis of intestine, part unspecified, without perforation or abscess without bleeding: Secondary | ICD-10-CM | POA: Diagnosis not present

## 2020-02-14 DIAGNOSIS — N200 Calculus of kidney: Secondary | ICD-10-CM | POA: Diagnosis not present

## 2020-02-14 DIAGNOSIS — M5185 Other intervertebral disc disorders, thoracolumbar region: Secondary | ICD-10-CM | POA: Diagnosis not present

## 2020-02-14 DIAGNOSIS — M25561 Pain in right knee: Secondary | ICD-10-CM | POA: Diagnosis not present

## 2020-02-15 DIAGNOSIS — N281 Cyst of kidney, acquired: Secondary | ICD-10-CM | POA: Diagnosis not present

## 2020-02-15 DIAGNOSIS — N200 Calculus of kidney: Secondary | ICD-10-CM | POA: Diagnosis not present

## 2020-02-24 ENCOUNTER — Telehealth: Payer: Self-pay | Admitting: Internal Medicine

## 2020-02-24 ENCOUNTER — Other Ambulatory Visit: Payer: Self-pay | Admitting: Cardiology

## 2020-02-24 DIAGNOSIS — I200 Unstable angina: Secondary | ICD-10-CM

## 2020-02-24 NOTE — Telephone Encounter (Signed)
PA for repatha [employer]click submitted via [facility] Request Reference Number: [id number]. REPATHA SURE INJ 140MG/ML is approved through 08/20/2020

## 2020-03-01 DIAGNOSIS — D869 Sarcoidosis, unspecified: Secondary | ICD-10-CM | POA: Diagnosis not present

## 2020-03-01 DIAGNOSIS — R0602 Shortness of breath: Secondary | ICD-10-CM | POA: Diagnosis not present

## 2020-03-08 DIAGNOSIS — M545 Low back pain: Secondary | ICD-10-CM | POA: Diagnosis not present

## 2020-03-08 DIAGNOSIS — M5416 Radiculopathy, lumbar region: Secondary | ICD-10-CM | POA: Diagnosis not present

## 2020-03-10 DIAGNOSIS — R319 Hematuria, unspecified: Secondary | ICD-10-CM | POA: Diagnosis not present

## 2020-03-10 DIAGNOSIS — Z6831 Body mass index (BMI) 31.0-31.9, adult: Secondary | ICD-10-CM | POA: Diagnosis not present

## 2020-03-10 DIAGNOSIS — K5190 Ulcerative colitis, unspecified, without complications: Secondary | ICD-10-CM | POA: Diagnosis not present

## 2020-03-10 DIAGNOSIS — I10 Essential (primary) hypertension: Secondary | ICD-10-CM | POA: Diagnosis not present

## 2020-03-10 DIAGNOSIS — Z299 Encounter for prophylactic measures, unspecified: Secondary | ICD-10-CM | POA: Diagnosis not present

## 2020-03-10 DIAGNOSIS — L4050 Arthropathic psoriasis, unspecified: Secondary | ICD-10-CM | POA: Diagnosis not present

## 2020-03-10 DIAGNOSIS — Z789 Other specified health status: Secondary | ICD-10-CM | POA: Diagnosis not present

## 2020-03-16 DIAGNOSIS — L821 Other seborrheic keratosis: Secondary | ICD-10-CM | POA: Diagnosis not present

## 2020-03-16 DIAGNOSIS — L820 Inflamed seborrheic keratosis: Secondary | ICD-10-CM | POA: Diagnosis not present

## 2020-03-16 DIAGNOSIS — L209 Atopic dermatitis, unspecified: Secondary | ICD-10-CM | POA: Diagnosis not present

## 2020-03-16 DIAGNOSIS — L905 Scar conditions and fibrosis of skin: Secondary | ICD-10-CM | POA: Diagnosis not present

## 2020-03-16 DIAGNOSIS — D692 Other nonthrombocytopenic purpura: Secondary | ICD-10-CM | POA: Diagnosis not present

## 2020-03-18 ENCOUNTER — Telehealth: Payer: Self-pay | Admitting: Internal Medicine

## 2020-03-18 ENCOUNTER — Encounter: Payer: Self-pay | Admitting: Urology

## 2020-03-18 ENCOUNTER — Other Ambulatory Visit: Payer: Self-pay

## 2020-03-18 ENCOUNTER — Ambulatory Visit (INDEPENDENT_AMBULATORY_CARE_PROVIDER_SITE_OTHER): Payer: Medicare Other | Admitting: Urology

## 2020-03-18 VITALS — BP 135/72 | HR 60 | Temp 98.7°F | Ht 71.5 in | Wt 220.0 lb

## 2020-03-18 DIAGNOSIS — N400 Enlarged prostate without lower urinary tract symptoms: Secondary | ICD-10-CM | POA: Insufficient documentation

## 2020-03-18 DIAGNOSIS — I200 Unstable angina: Secondary | ICD-10-CM

## 2020-03-18 DIAGNOSIS — N3281 Overactive bladder: Secondary | ICD-10-CM | POA: Diagnosis not present

## 2020-03-18 DIAGNOSIS — R310 Gross hematuria: Secondary | ICD-10-CM | POA: Diagnosis not present

## 2020-03-18 LAB — URINALYSIS, ROUTINE W REFLEX MICROSCOPIC
Bilirubin, UA: NEGATIVE
Glucose, UA: NEGATIVE
Ketones, UA: NEGATIVE
Leukocytes,UA: NEGATIVE
Nitrite, UA: NEGATIVE
Protein,UA: NEGATIVE
RBC, UA: NEGATIVE
Specific Gravity, UA: 1.01 (ref 1.005–1.030)
Urobilinogen, Ur: 0.2 mg/dL (ref 0.2–1.0)
pH, UA: 6 (ref 5.0–7.5)

## 2020-03-18 NOTE — Patient Instructions (Signed)
Hematuria, Adult Hematuria is blood in the urine. Blood may be visible in the urine, or it may be identified with a test. This condition can be caused by infections of the bladder, urethra, kidney, or prostate. Other possible causes include:  Kidney stones.  Cancer of the urinary tract.  Too much calcium in the urine.  Conditions that are passed from parent to child (inherited conditions).  Exercise that requires a lot of energy. Infections can usually be treated with medicine, and a kidney stone usually will pass through your urine. If neither of these is the cause of your hematuria, more tests may be needed to identify the cause of your symptoms. It is very important to tell your health care provider about any blood in your urine, even if it is painless or the blood stops without treatment. Blood in the urine, when it happens and then stops and then happens again, can be a symptom of a very serious condition, including cancer. There is no pain in the initial stages of many urinary cancers. Follow these instructions at home: Medicines  Take over-the-counter and prescription medicines only as told by your health care provider.  If you were prescribed an antibiotic medicine, take it as told by your health care provider. Do not stop taking the antibiotic even if you start to feel better. Eating and drinking  Drink enough fluid to keep your urine clear or pale yellow. It is recommended that you drink 3-4 quarts (2.8-3.8 L) a day. If you have been diagnosed with an infection, it is recommended that you drink cranberry juice in addition to large amounts of water.  Avoid caffeine, tea, and carbonated beverages. These tend to irritate the bladder.  Avoid alcohol because it may irritate the prostate (men). General instructions  If you have been diagnosed with a kidney stone, follow your health care provider's instructions about straining your urine to catch the stone.  Empty your bladder  often. Avoid holding urine for long periods of time.  If you are male: ? After a bowel movement, wipe from front to back and use each piece of toilet paper only once. ? Empty your bladder before and after sex.  Pay attention to any changes in your symptoms. Tell your health care provider about any changes or any new symptoms.  It is your responsibility to get your test results. Ask your health care provider, or the department performing the test, when your results will be ready.  Keep all follow-up visits as told by your health care provider. This is important. Contact a health care provider if:  You develop back pain.  You have a fever.  You have nausea or vomiting.  Your symptoms do not improve after 3 days.  Your symptoms get worse. Get help right away if:  You develop severe vomiting and are unable take medicine without vomiting.  You develop severe pain in your back or abdomen even though you are taking medicine.  You pass a large amount of blood in your urine.  You pass blood clots in your urine.  You feel very weak or like you might faint.  You faint. Summary  Hematuria is blood in the urine. It has many possible causes.  It is very important that you tell your health care provider about any blood in your urine, even if it is painless or the blood stops without treatment.  Take over-the-counter and prescription medicines only as told by your health care provider.  Drink enough fluid to keep   your urine clear or pale yellow. This information is not intended to replace advice given to you by your health care provider. Make sure you discuss any questions you have with your health care provider. Document Revised: 01/01/2019 Document Reviewed: 09/09/2016 Elsevier Patient Education  2020 Elsevier Inc.  

## 2020-03-18 NOTE — Telephone Encounter (Signed)
*  STAT* If patient is at the pharmacy, call can be transferred to refill team.   1. Which medications need to be refilled? (please list name of each medication and dose if known)  Evolocumab (REPATHA SURECLICK) 585 MG/ML SOAJ  2. Which pharmacy/location (including street and city if local pharmacy) is medication to be sent to? [facility] [phone] - [place] [address]  3. Do they need a 30 day or 90 day supply? 1 dose  Porfirio is calling stating [facility] is having trouble processing this prescription so it will not be delivered to him by the time it is due.  He is requesting a prescription be sent to [facility] so he won't fall off schedule. Please advise.

## 2020-03-18 NOTE — Progress Notes (Signed)
Urological Symptom Review  Patient is experiencing the following symptoms: None   Review of Systems  Gastrointestinal (upper)  : Negative for upper GI symptoms  Gastrointestinal (lower) : Negative for lower GI symptoms  Constitutional : Negative for symptoms  Skin: Negative for skin symptoms  Eyes: Negative for eye symptoms  Ear/Nose/Throat : Negative for Ear/Nose/Throat symptoms  Hematologic/Lymphatic: Negative for Hematologic/Lymphatic symptoms  Cardiovascular : Negative for cardiovascular symptoms  Respiratory : Negative for respiratory symptoms  Endocrine: Negative for endocrine symptoms  Musculoskeletal: Negative for musculoskeletal symptoms  Neurological: Negative for neurological symptoms  Psychologic: Negative for psychiatric symptoms

## 2020-03-18 NOTE — Progress Notes (Signed)
03/18/2020 9:32 AM   Richard Webb 1946-08-19 [id number]  Referring provider: [name], MD [place],   [id number]  BPH and OAB followup, gross hematuria  HPI: Richard Webb is a 74yo here for followup for BPH and OAB. Starting 2 months ago he developed gross painless hematuria which resolved yesterday. He has a hx of diverticulitis and was recently treated with cipro and flagyl. Urinary stream is strong. He has nocturia 2x. He has urinary frequency and urgency with occasional urge incontinence.   His records from [facility] are as follows: I have an enlarged prostate (follow-up).  HPI: Richard Webb is a 74 year-old male established patient who is here for an enlarged prostate follow-up evaluation.  He is currently taking none. He is on new medications for symptoms of prostate enlargement.   07/20/2017: He was switched to uroxatral at last visit and has since stopped the medication since last visit. He has urinary frequency every 1-2 hours. He underwent UDS which showed an obstructed pattern. He has also stopped the mirabegron.   08/29/2017: s/p urolift. Voiding trial passed today   09/05/2017: He presented to our office on 1/10 with hematuria and UTI. He then went to the ER that night and had a foley placed. Urine is now clear.   10/12/2017: His LUTS are greatly improved since last visit.   05/22/2019: He was doing well until 6 months when the urinary frequency and urgency got worse. Placed on 1 week of sulfa   11.17.2020: Here for recheck following abx mgmt. He states that he did not tolerate sulfa very well (GI sx's) but it might've helped his urinary sx's somewhat -- he is still going quite often though. He was also started on myrbetriq but he thinks this left him pretty much unable to urinate so it was discontinued after 4-5 days. He does admit that he drinks a large volume of caffeine.     CC: I urinate too frequently in the daytime.  HPI: He first noticed the  symptom approximately 11/20/2018. He does urinate more frequently than once every 4 hours in the daytime. He does urinate more frequently than once every 2 hours in the daytime. He does urinate more frequently than once every hour in the daytime. He does not take a diuretic.   He does not have to strain or bear down to start his urinary stream.   He has not been treated with medications in the past for his lower urinary tract symptoms. The patient has never had a surgical procedure for bladder outlet obstruction to his prostate.   05/22/2019:Over the past 6 months his urinary frequency, urgency has worsened. occasional urge incontinence. He has new suprapubic pain      CC: I am having trouble with my erections.  HPI:   05/22/2019: The patient is on isosorbide. Dr Richard Webb discussed VED, penile injections, IPP.     PMH: Past Medical History:  Diagnosis Date  . Arthritis   . Asthma   . Coronary atherosclerosis of native coronary artery    a. DES x 3 RCA and DES mCx 10/11 - [place] - with residual 50-70% stenosis in the distal Cx in AV groove, 70% distal LAD beyond apex, 70% small D1. b. 12/2017: similar results with multivessel CAD. Patent stents. Medical management recommended.   Marland Kitchen GERD (gastroesophageal reflux disease)   . History of kidney stones   . Mixed hyperlipidemia   . Obstructive sleep apnea   . Pulmonary hypertension ([place])  PASP 50 mmHg  . Restless leg syndrome   . Sarcoidosis   . Statin intolerance   . Ulcerative colitis [facility])     Surgical History: Past Surgical History:  Procedure Laterality Date  . Ankle fracture repair Left 1985  . CARDIAC CATHETERIZATION     stents x4  . CHOLECYSTECTOMY  1970  . CYSTOSCOPY WITH INSERTION OF UROLIFT N/A 08/27/2017   Procedure: CYSTOSCOPY WITH INSERTION OF UROLIFT;  Surgeon: Cleon Gustin, MD;  Location: [facility];  Service: Urology;  Laterality: N/A;  . LAPAROSCOPIC APPENDECTOMY  2013  . LEFT HEART CATH AND CORONARY  ANGIOGRAPHY N/A 01/02/2018   Procedure: LEFT HEART CATH AND CORONARY ANGIOGRAPHY;  Surgeon: Troy Sine, MD;  Location: [place] [facility];  Service: Cardiovascular;  Laterality: N/A;  . Right total knee replacement Bilateral 2005    Home Medications:  Allergies as of 03/18/2020      Reactions   Bee Venom Anaphylaxis   Statins    Myalgias - Simvastatin and Rosuvastatin      Medication List       Accurate as of March 18, 2020  9:32 AM. If you have any questions, ask your nurse or doctor.        amLODipine 10 MG tablet Commonly known as: NORVASC TAKE 1 TABLET BY MOUTH  DAILY   aspirin EC 81 MG tablet Take 162 mg by mouth daily.   B-12 1000 MCG/ML Kit Inject 1,000 mcg as directed every 30 (thirty) days.   cyanocobalamin 1000 MCG/ML injection Commonly known as: (VITAMIN B-12) Inject 1,000 mcg into the muscle every 30 (thirty) days.   Breo Ellipta 200-25 MCG/INH Aepb Generic drug: fluticasone furoate-vilanterol Inhale 1 Inhaler into the lungs daily.   carbidopa-levodopa 10-100 MG tablet Commonly known as: SINEMET IR Take 1 tablet by mouth at bedtime as needed (restless legs).   cetirizine 10 MG tablet Commonly known as: ZYRTEC Take 10 mg by mouth daily.   DUPIXENT [place] Inject into the skin every 14 (fourteen) days.   EPINEPHrine 0.15 MG/0.3ML injection Commonly known as: EPIPEN JR Inject 0.15 mg into the muscle as needed for anaphylaxis.   hydrochlorothiazide 25 MG tablet Commonly known as: HYDRODIURIL Take 25 mg by mouth daily.   HYDROcodone-acetaminophen 5-325 MG tablet Commonly known as: NORCO/VICODIN Take 1 tablet by mouth every 8 (eight) hours as needed for moderate pain.   hydrocortisone cream 1 % Apply 1 application topically 2 (two) times daily as needed for itching (psoriasis).   isosorbide mononitrate 30 MG 24 hr tablet Commonly known as: IMDUR TAKE 1 TABLET BY MOUTH  DAILY   losartan 50 MG tablet Commonly known as: COZAAR Take 1 tablet (50 mg  total) by mouth daily.   meloxicam 15 MG tablet Commonly known as: MOBIC Take 15 mg by mouth daily.   mesalamine 0.375 g 24 hr capsule Commonly known as: APRISO Take 375 mg by mouth daily.   metoprolol tartrate 25 MG tablet Commonly known as: LOPRESSOR TAKE ONE-HALF TABLET BY  MOUTH TWICE DAILY   montelukast 10 MG tablet Commonly known as: SINGULAIR Take 10 mg by mouth at bedtime.   nitroGLYCERIN 0.4 MG SL tablet Commonly known as: NITROSTAT Place 1 tablet (0.4 mg total) under the tongue every 5 (five) minutes x 3 doses as needed for chest pain (if no relief after 3rd dose, proceed to the ED for an evaluation or call 911).   NON FORMULARY CPAP UAD   oxymetazoline 0.05 % nasal spray Commonly known as:  AFRIN Place 1 spray into both nostrils at bedtime.   Repatha SureClick 161 MG/ML [place] Generic drug: Evolocumab Inject 1 Dose into the skin every 14 (fourteen) days.   temazepam 30 MG capsule Commonly known as: RESTORIL Take 30 mg by mouth at bedtime as needed for sleep.   Vitamin D (Ergocalciferol) 1.25 MG (50000 UNIT) Caps capsule Commonly known as: DRISDOL Take 50,000 Units by mouth every 7 (seven) days. fridays       Allergies:  Allergies  Allergen Reactions  . Bee Venom Anaphylaxis  . Statins     Myalgias - Simvastatin and Rosuvastatin    Family History: Family History  Problem Relation Age of Onset  . Colon cancer Mother   . CAD Father     Social History:  reports that he has never smoked. He has never used smokeless tobacco. He reports that he does not drink alcohol and does not use drugs.  ROS: All other review of systems were reviewed and are negative except what is noted above in HPI  Physical Exam: BP (!) 135/72   Pulse 60   Temp 98.7 F (37.1 C)   Ht 5' 11.5" (1.816 m)   Wt (!) 220 lb (99.8 kg)   BMI 30.26 kg/m   Constitutional:  Alert and oriented, No acute distress. HEENT: [place] AT, moist mucus membranes.  Trachea midline, no  masses. Cardiovascular: No clubbing, cyanosis, or edema. Respiratory: Normal respiratory effort, no increased work of breathing. GI: Abdomen is soft, nontender, nondistended, no abdominal masses GU: No CVA tenderness.  Lymph: No cervical or inguinal lymphadenopathy. Skin: No rashes, bruises or suspicious lesions. Neurologic: Grossly intact, no focal deficits, moving all 4 extremities. Psychiatric: Normal mood and affect.  Laboratory Data: Lab Results  Component Value Date   WBC 8.1 01/02/2018   HGB 13.0 01/02/2018   HCT 41.8 01/02/2018   MCV 86.9 01/02/2018   PLT 287 01/02/2018    Lab Results  Component Value Date   CREATININE 0.83 01/02/2018    No results found for: PSA  No results found for: TESTOSTERONE  No results found for: HGBA1C  Urinalysis    Component Value Date/Time   COLORURINE AMBER (A) 08/31/2017 0000   APPEARANCEUR CLOUDY (A) 08/31/2017 0000   LABSPEC 1.018 08/31/2017 0000   PHURINE 5.0 08/31/2017 0000   GLUCOSEU NEGATIVE 08/31/2017 0000   HGBUR LARGE (A) 08/31/2017 0000   BILIRUBINUR NEGATIVE 08/31/2017 0000   KETONESUR NEGATIVE 08/31/2017 0000   PROTEINUR 100 (A) 08/31/2017 0000   NITRITE NEGATIVE 08/31/2017 0000   LEUKOCYTESUR NEGATIVE 08/31/2017 0000    Lab Results  Component Value Date   BACTERIA NONE SEEN 08/31/2017    Pertinent Imaging:  No results found for this or any previous visit.  No results found for this or any previous visit.  No results found for this or any previous visit.  No results found for this or any previous visit.  No results found for this or any previous visit.  No results found for this or any previous visit.  No results found for this or any previous visit.  No results found for this or any previous visit.   Assessment & Plan:    1. Benign prostatic hyperplasia, unspecified whether lower urinary tract symptoms present -s/p Urolift, doing well - Urinalysis, Routine w reflex microscopic  2. OAB:   Continue timed voiding and fluid management  3. Gross hematuria -BMP -CT hematuria  -office cystoscopy   No follow-ups on file.  Nicolette Bang, MD  [facility] [place]

## 2020-03-19 DIAGNOSIS — I10 Essential (primary) hypertension: Secondary | ICD-10-CM | POA: Diagnosis not present

## 2020-03-19 DIAGNOSIS — I2510 Atherosclerotic heart disease of native coronary artery without angina pectoris: Secondary | ICD-10-CM | POA: Diagnosis not present

## 2020-03-19 LAB — BASIC METABOLIC PANEL
BUN/Creatinine Ratio: 17 (ref 10–24)
BUN: 21 mg/dL (ref 8–27)
CO2: 23 mmol/L (ref 20–29)
Calcium: 9.7 mg/dL (ref 8.6–10.2)
Chloride: 98 mmol/L (ref 96–106)
Creatinine, Ser: 1.21 mg/dL (ref 0.76–1.27)
GFR calc Af Amer: 68 mL/min/{1.73_m2} (ref >59)
GFR calc non Af Amer: 59 mL/min/{1.73_m2} — ABNORMAL LOW (ref >59)
Glucose: 91 mg/dL (ref 65–99)
Potassium: 4 mmol/L (ref 3.5–5.2)
Sodium: 136 mmol/L (ref 134–144)

## 2020-03-22 MED ORDER — REPATHA SURECLICK 140 MG/ML ~~LOC~~ SOAJ: 1.0000 | SUBCUTANEOUS | 3 refills | Status: DC

## 2020-03-22 NOTE — Telephone Encounter (Signed)
lmomed the pt that the med has been refilled

## 2020-03-24 ENCOUNTER — Other Ambulatory Visit: Payer: Self-pay | Admitting: Cardiology

## 2020-03-30 NOTE — Progress Notes (Signed)
Pt made aware via my chart

## 2020-04-09 ENCOUNTER — Encounter (HOSPITAL_COMMUNITY): Payer: Self-pay

## 2020-04-09 ENCOUNTER — Other Ambulatory Visit: Payer: Self-pay

## 2020-04-09 ENCOUNTER — Ambulatory Visit (HOSPITAL_COMMUNITY)
Admission: RE | Admit: 2020-04-09 | Discharge: 2020-04-09 | Disposition: A | Discharge status: Home / Self Care | Payer: Medicare Other | Source: Ambulatory Visit | Attending: Urology | Admitting: Urology

## 2020-04-09 DIAGNOSIS — K5732 Diverticulitis of large intestine without perforation or abscess without bleeding: Secondary | ICD-10-CM | POA: Diagnosis not present

## 2020-04-09 DIAGNOSIS — R310 Gross hematuria: Secondary | ICD-10-CM | POA: Diagnosis not present

## 2020-04-09 DIAGNOSIS — I10 Essential (primary) hypertension: Secondary | ICD-10-CM | POA: Diagnosis not present

## 2020-04-09 DIAGNOSIS — I700 Atherosclerosis of aorta: Secondary | ICD-10-CM | POA: Diagnosis not present

## 2020-04-09 DIAGNOSIS — K7460 Unspecified cirrhosis of liver: Secondary | ICD-10-CM | POA: Diagnosis not present

## 2020-04-09 MED ORDER — IOHEXOL 300 MG/ML  SOLN
100.0000 mL | Freq: Once | INTRAMUSCULAR | Status: AC | PRN
  Administered 2020-04-09: 100 mL via INTRAVENOUS

## 2020-04-12 DIAGNOSIS — K760 Fatty (change of) liver, not elsewhere classified: Secondary | ICD-10-CM | POA: Diagnosis not present

## 2020-04-12 DIAGNOSIS — K269 Duodenal ulcer, unspecified as acute or chronic, without hemorrhage or perforation: Secondary | ICD-10-CM | POA: Diagnosis not present

## 2020-04-12 DIAGNOSIS — R194 Change in bowel habit: Secondary | ICD-10-CM | POA: Diagnosis not present

## 2020-04-12 DIAGNOSIS — K5190 Ulcerative colitis, unspecified, without complications: Secondary | ICD-10-CM | POA: Diagnosis not present

## 2020-04-12 DIAGNOSIS — R319 Hematuria, unspecified: Secondary | ICD-10-CM | POA: Diagnosis not present

## 2020-04-16 ENCOUNTER — Other Ambulatory Visit: Payer: Self-pay

## 2020-04-16 ENCOUNTER — Telehealth: Payer: Self-pay | Admitting: Urology

## 2020-04-16 ENCOUNTER — Encounter (HOSPITAL_COMMUNITY): Payer: Self-pay

## 2020-04-16 DIAGNOSIS — R9431 Abnormal electrocardiogram [ECG] [EKG]: Secondary | ICD-10-CM | POA: Diagnosis not present

## 2020-04-16 DIAGNOSIS — K651 Peritoneal abscess: Secondary | ICD-10-CM | POA: Diagnosis not present

## 2020-04-16 DIAGNOSIS — K219 Gastro-esophageal reflux disease without esophagitis: Secondary | ICD-10-CM | POA: Diagnosis not present

## 2020-04-16 DIAGNOSIS — G4730 Sleep apnea, unspecified: Secondary | ICD-10-CM | POA: Diagnosis present

## 2020-04-16 DIAGNOSIS — I2510 Atherosclerotic heart disease of native coronary artery without angina pectoris: Secondary | ICD-10-CM | POA: Diagnosis present

## 2020-04-16 DIAGNOSIS — G4733 Obstructive sleep apnea (adult) (pediatric): Secondary | ICD-10-CM | POA: Diagnosis present

## 2020-04-16 DIAGNOSIS — Z96653 Presence of artificial knee joint, bilateral: Secondary | ICD-10-CM | POA: Diagnosis present

## 2020-04-16 DIAGNOSIS — Z955 Presence of coronary angioplasty implant and graft: Secondary | ICD-10-CM

## 2020-04-16 DIAGNOSIS — E669 Obesity, unspecified: Secondary | ICD-10-CM | POA: Diagnosis present

## 2020-04-16 DIAGNOSIS — Z20822 Contact with and (suspected) exposure to covid-19: Secondary | ICD-10-CM | POA: Diagnosis not present

## 2020-04-16 DIAGNOSIS — Z8249 Family history of ischemic heart disease and other diseases of the circulatory system: Secondary | ICD-10-CM

## 2020-04-16 DIAGNOSIS — Z888 Allergy status to other drugs, medicaments and biological substances status: Secondary | ICD-10-CM

## 2020-04-16 DIAGNOSIS — G20 Parkinson's disease: Secondary | ICD-10-CM | POA: Diagnosis present

## 2020-04-16 DIAGNOSIS — R319 Hematuria, unspecified: Secondary | ICD-10-CM | POA: Diagnosis present

## 2020-04-16 DIAGNOSIS — I272 Pulmonary hypertension, unspecified: Secondary | ICD-10-CM | POA: Diagnosis present

## 2020-04-16 DIAGNOSIS — K449 Diaphragmatic hernia without obstruction or gangrene: Secondary | ICD-10-CM | POA: Diagnosis not present

## 2020-04-16 DIAGNOSIS — Z9049 Acquired absence of other specified parts of digestive tract: Secondary | ICD-10-CM

## 2020-04-16 DIAGNOSIS — Z6829 Body mass index (BMI) 29.0-29.9, adult: Secondary | ICD-10-CM

## 2020-04-16 DIAGNOSIS — D869 Sarcoidosis, unspecified: Secondary | ICD-10-CM | POA: Diagnosis present

## 2020-04-16 DIAGNOSIS — K7460 Unspecified cirrhosis of liver: Secondary | ICD-10-CM | POA: Diagnosis present

## 2020-04-16 DIAGNOSIS — Z91030 Bee allergy status: Secondary | ICD-10-CM

## 2020-04-16 DIAGNOSIS — N400 Enlarged prostate without lower urinary tract symptoms: Secondary | ICD-10-CM | POA: Diagnosis present

## 2020-04-16 DIAGNOSIS — K5720 Diverticulitis of large intestine with perforation and abscess without bleeding: Principal | ICD-10-CM | POA: Diagnosis present

## 2020-04-16 DIAGNOSIS — I700 Atherosclerosis of aorta: Secondary | ICD-10-CM | POA: Diagnosis not present

## 2020-04-16 DIAGNOSIS — G8929 Other chronic pain: Secondary | ICD-10-CM | POA: Diagnosis present

## 2020-04-16 DIAGNOSIS — M722 Plantar fascial fibromatosis: Secondary | ICD-10-CM | POA: Diagnosis present

## 2020-04-16 DIAGNOSIS — K766 Portal hypertension: Secondary | ICD-10-CM | POA: Diagnosis present

## 2020-04-16 DIAGNOSIS — Z800 Family history of malignant neoplasm of digestive organs: Secondary | ICD-10-CM

## 2020-04-16 DIAGNOSIS — G2581 Restless legs syndrome: Secondary | ICD-10-CM | POA: Diagnosis present

## 2020-04-16 DIAGNOSIS — E782 Mixed hyperlipidemia: Secondary | ICD-10-CM | POA: Diagnosis present

## 2020-04-16 DIAGNOSIS — Z7982 Long term (current) use of aspirin: Secondary | ICD-10-CM

## 2020-04-16 DIAGNOSIS — Z79899 Other long term (current) drug therapy: Secondary | ICD-10-CM

## 2020-04-16 DIAGNOSIS — I10 Essential (primary) hypertension: Secondary | ICD-10-CM | POA: Diagnosis present

## 2020-04-16 LAB — COMPREHENSIVE METABOLIC PANEL
ALT: 18 U/L (ref 0–44)
AST: 20 U/L (ref 15–41)
Albumin: 4.4 g/dL (ref 3.5–5.0)
Alkaline Phosphatase: 79 U/L (ref 38–126)
Anion gap: 13 (ref 5–15)
BUN: 10 mg/dL (ref 8–23)
CO2: 26 mmol/L (ref 22–32)
Calcium: 9.3 mg/dL (ref 8.9–10.3)
Chloride: 97 mmol/L — ABNORMAL LOW (ref 98–111)
Creatinine, Ser: 0.99 mg/dL (ref 0.61–1.24)
GFR calc Af Amer: >60 mL/min (ref >60)
GFR calc non Af Amer: >60 mL/min (ref >60)
Glucose, Bld: 109 mg/dL — ABNORMAL HIGH (ref 70–99)
Potassium: 3 mmol/L — ABNORMAL LOW (ref 3.5–5.1)
Sodium: 136 mmol/L (ref 135–145)
Total Bilirubin: 2.1 mg/dL — ABNORMAL HIGH (ref 0.3–1.2)
Total Protein: 8.2 g/dL — ABNORMAL HIGH (ref 6.5–8.1)

## 2020-04-16 LAB — CBC
HCT: 43.3 % (ref 39.0–52.0)
Hemoglobin: 13.8 g/dL (ref 13.0–17.0)
MCH: 27.8 pg (ref 26.0–34.0)
MCHC: 31.9 g/dL (ref 30.0–36.0)
MCV: 87.1 fL (ref 80.0–100.0)
Platelets: 308 10*3/uL (ref 150–400)
RBC: 4.97 MIL/uL (ref 4.22–5.81)
RDW: 15.3 % (ref 11.5–15.5)
WBC: 13 10*3/uL — ABNORMAL HIGH (ref 4.0–10.5)
nRBC: 0 % (ref 0.0–0.2)

## 2020-04-16 LAB — LIPASE, BLOOD: Lipase: 31 U/L (ref 11–51)

## 2020-04-16 NOTE — Telephone Encounter (Signed)
I spoke with pt- wishes to have appt moved sooner. I was only able to move 1 day sooner d/t Dr. Alyson Ingles not in office on Mon/Tuesday. Pt accepted appt.

## 2020-04-16 NOTE — Telephone Encounter (Signed)
Patient called and asked for a nurse to return his call. He has an appointment next Thurs but feels he needs to be seen sooner.

## 2020-04-16 NOTE — ED Triage Notes (Signed)
Pt reports abdominal pain that started over a week ago, pt also has reported  Fever controlled with Tylenol. Pt denies nausea and vomiting.

## 2020-04-17 ENCOUNTER — Inpatient Hospital Stay (HOSPITAL_COMMUNITY)
Admission: EM | Admit: 2020-04-17 | Discharge: 2020-04-20 | DRG: 391 | Disposition: A | Discharge status: Home / Self Care | Payer: Medicare Other | Attending: Internal Medicine | Admitting: Internal Medicine

## 2020-04-17 ENCOUNTER — Emergency Department (HOSPITAL_COMMUNITY): Payer: Medicare Other

## 2020-04-17 DIAGNOSIS — K219 Gastro-esophageal reflux disease without esophagitis: Secondary | ICD-10-CM | POA: Diagnosis present

## 2020-04-17 DIAGNOSIS — N400 Enlarged prostate without lower urinary tract symptoms: Secondary | ICD-10-CM | POA: Diagnosis present

## 2020-04-17 DIAGNOSIS — K7460 Unspecified cirrhosis of liver: Secondary | ICD-10-CM | POA: Diagnosis present

## 2020-04-17 DIAGNOSIS — G4730 Sleep apnea, unspecified: Secondary | ICD-10-CM | POA: Diagnosis present

## 2020-04-17 DIAGNOSIS — K651 Peritoneal abscess: Secondary | ICD-10-CM | POA: Diagnosis not present

## 2020-04-17 DIAGNOSIS — E782 Mixed hyperlipidemia: Secondary | ICD-10-CM | POA: Diagnosis present

## 2020-04-17 DIAGNOSIS — Z8249 Family history of ischemic heart disease and other diseases of the circulatory system: Secondary | ICD-10-CM | POA: Diagnosis not present

## 2020-04-17 DIAGNOSIS — G2581 Restless legs syndrome: Secondary | ICD-10-CM | POA: Diagnosis present

## 2020-04-17 DIAGNOSIS — I10 Essential (primary) hypertension: Secondary | ICD-10-CM | POA: Diagnosis present

## 2020-04-17 DIAGNOSIS — K5720 Diverticulitis of large intestine with perforation and abscess without bleeding: Secondary | ICD-10-CM

## 2020-04-17 DIAGNOSIS — Z955 Presence of coronary angioplasty implant and graft: Secondary | ICD-10-CM | POA: Diagnosis not present

## 2020-04-17 DIAGNOSIS — Z96653 Presence of artificial knee joint, bilateral: Secondary | ICD-10-CM | POA: Diagnosis present

## 2020-04-17 DIAGNOSIS — I2510 Atherosclerotic heart disease of native coronary artery without angina pectoris: Secondary | ICD-10-CM | POA: Diagnosis present

## 2020-04-17 DIAGNOSIS — G8929 Other chronic pain: Secondary | ICD-10-CM | POA: Diagnosis present

## 2020-04-17 DIAGNOSIS — K449 Diaphragmatic hernia without obstruction or gangrene: Secondary | ICD-10-CM | POA: Diagnosis not present

## 2020-04-17 DIAGNOSIS — R319 Hematuria, unspecified: Secondary | ICD-10-CM | POA: Diagnosis present

## 2020-04-17 DIAGNOSIS — E669 Obesity, unspecified: Secondary | ICD-10-CM | POA: Diagnosis present

## 2020-04-17 DIAGNOSIS — M19072 Primary osteoarthritis, left ankle and foot: Secondary | ICD-10-CM | POA: Diagnosis not present

## 2020-04-17 DIAGNOSIS — M7989 Other specified soft tissue disorders: Secondary | ICD-10-CM | POA: Diagnosis not present

## 2020-04-17 DIAGNOSIS — R609 Edema, unspecified: Secondary | ICD-10-CM | POA: Diagnosis not present

## 2020-04-17 DIAGNOSIS — G4733 Obstructive sleep apnea (adult) (pediatric): Secondary | ICD-10-CM | POA: Diagnosis present

## 2020-04-17 DIAGNOSIS — D869 Sarcoidosis, unspecified: Secondary | ICD-10-CM | POA: Diagnosis present

## 2020-04-17 DIAGNOSIS — I700 Atherosclerosis of aorta: Secondary | ICD-10-CM | POA: Diagnosis not present

## 2020-04-17 DIAGNOSIS — G20 Parkinson's disease: Secondary | ICD-10-CM | POA: Diagnosis present

## 2020-04-17 DIAGNOSIS — K766 Portal hypertension: Secondary | ICD-10-CM | POA: Diagnosis present

## 2020-04-17 DIAGNOSIS — M722 Plantar fascial fibromatosis: Secondary | ICD-10-CM | POA: Diagnosis present

## 2020-04-17 DIAGNOSIS — I272 Pulmonary hypertension, unspecified: Secondary | ICD-10-CM | POA: Diagnosis present

## 2020-04-17 DIAGNOSIS — Z20822 Contact with and (suspected) exposure to covid-19: Secondary | ICD-10-CM | POA: Diagnosis present

## 2020-04-17 DIAGNOSIS — K5792 Diverticulitis of intestine, part unspecified, without perforation or abscess without bleeding: Secondary | ICD-10-CM | POA: Diagnosis present

## 2020-04-17 DIAGNOSIS — I200 Unstable angina: Secondary | ICD-10-CM | POA: Diagnosis not present

## 2020-04-17 DIAGNOSIS — R52 Pain, unspecified: Secondary | ICD-10-CM

## 2020-04-17 DIAGNOSIS — Z9049 Acquired absence of other specified parts of digestive tract: Secondary | ICD-10-CM | POA: Diagnosis not present

## 2020-04-17 LAB — URINALYSIS, ROUTINE W REFLEX MICROSCOPIC
Bacteria, UA: NONE SEEN
Bilirubin Urine: NEGATIVE
Glucose, UA: NEGATIVE mg/dL
Ketones, ur: NEGATIVE mg/dL
Leukocytes,Ua: NEGATIVE
Nitrite: NEGATIVE
Protein, ur: NEGATIVE mg/dL
Specific Gravity, Urine: 1.008 (ref 1.005–1.030)
pH: 6 (ref 5.0–8.0)

## 2020-04-17 LAB — CBC WITH DIFFERENTIAL/PLATELET
Abs Immature Granulocytes: 0.06 10*3/uL (ref 0.00–0.07)
Basophils Absolute: 0 10*3/uL (ref 0.0–0.1)
Basophils Relative: 0 %
Eosinophils Absolute: 0.2 10*3/uL (ref 0.0–0.5)
Eosinophils Relative: 2 %
HCT: 42.3 % (ref 39.0–52.0)
Hemoglobin: 13.5 g/dL (ref 13.0–17.0)
Immature Granulocytes: 1 %
Lymphocytes Relative: 11 %
Lymphs Abs: 1.3 10*3/uL (ref 0.7–4.0)
MCH: 27.7 pg (ref 26.0–34.0)
MCHC: 31.9 g/dL (ref 30.0–36.0)
MCV: 86.9 fL (ref 80.0–100.0)
Monocytes Absolute: 1 10*3/uL (ref 0.1–1.0)
Monocytes Relative: 8 %
Neutro Abs: 9.6 10*3/uL — ABNORMAL HIGH (ref 1.7–7.7)
Neutrophils Relative %: 78 %
Platelets: 252 10*3/uL (ref 150–400)
RBC: 4.87 MIL/uL (ref 4.22–5.81)
RDW: 15.3 % (ref 11.5–15.5)
WBC: 12.3 10*3/uL — ABNORMAL HIGH (ref 4.0–10.5)
nRBC: 0 % (ref 0.0–0.2)

## 2020-04-17 LAB — SARS CORONAVIRUS 2 BY RT PCR (HOSPITAL ORDER, PERFORMED IN ~~LOC~~ HOSPITAL LAB): SARS Coronavirus 2: NEGATIVE

## 2020-04-17 LAB — LACTIC ACID, PLASMA: Lactic Acid, Venous: 1.5 mmol/L (ref 0.5–1.9)

## 2020-04-17 MED ORDER — METOPROLOL TARTRATE 12.5 MG HALF TABLET
12.5000 mg | ORAL_TABLET | Freq: Two times a day (BID) | ORAL | Status: DC
  Administered 2020-04-17 – 2020-04-19 (×4): 12.5 mg via ORAL
  Filled 2020-04-17 (×5): qty 1

## 2020-04-17 MED ORDER — PIPERACILLIN-TAZOBACTAM 3.375 G IVPB 30 MIN
3.3750 g | Freq: Once | INTRAVENOUS | Status: AC
  Administered 2020-04-17: 3.375 g via INTRAVENOUS
  Filled 2020-04-17: qty 50

## 2020-04-17 MED ORDER — CARBIDOPA-LEVODOPA 10-100 MG PO TABS
1.0000 | ORAL_TABLET | Freq: Every evening | ORAL | Status: DC | PRN
  Filled 2020-04-17: qty 1

## 2020-04-17 MED ORDER — LACTATED RINGERS IV SOLN: INTRAVENOUS | Status: DC

## 2020-04-17 MED ORDER — HYDROCHLOROTHIAZIDE 25 MG PO TABS
25.0000 mg | ORAL_TABLET | Freq: Every day | ORAL | Status: DC
  Administered 2020-04-18: 25 mg via ORAL
  Filled 2020-04-17 (×2): qty 1

## 2020-04-17 MED ORDER — TEMAZEPAM 15 MG PO CAPS
30.0000 mg | ORAL_CAPSULE | Freq: Every evening | ORAL | Status: DC | PRN
  Administered 2020-04-17: 30 mg via ORAL
  Filled 2020-04-17: qty 2

## 2020-04-17 MED ORDER — ONDANSETRON HCL 4 MG PO TABS: 4.0000 mg | ORAL_TABLET | Freq: Four times a day (QID) | ORAL | Status: DC | PRN

## 2020-04-17 MED ORDER — ONDANSETRON HCL 4 MG/2ML IJ SOLN
4.0000 mg | Freq: Once | INTRAMUSCULAR | Status: AC
  Administered 2020-04-17: 4 mg via INTRAVENOUS
  Filled 2020-04-17: qty 2

## 2020-04-17 MED ORDER — SODIUM CHLORIDE 0.9 % IV SOLN: 250.0000 mL | INTRAVENOUS | Status: DC | PRN

## 2020-04-17 MED ORDER — MORPHINE SULFATE (PF) 2 MG/ML IV SOLN
2.0000 mg | INTRAVENOUS | Status: DC | PRN
  Administered 2020-04-17 – 2020-04-18 (×5): 2 mg via INTRAVENOUS
  Filled 2020-04-17 (×5): qty 1

## 2020-04-17 MED ORDER — FENTANYL CITRATE (PF) 100 MCG/2ML IJ SOLN
50.0000 ug | Freq: Once | INTRAMUSCULAR | Status: AC
  Administered 2020-04-17: 50 ug via INTRAVENOUS
  Filled 2020-04-17: qty 2

## 2020-04-17 MED ORDER — POTASSIUM CHLORIDE 10 MEQ/100ML IV SOLN
10.0000 meq | INTRAVENOUS | Status: AC
  Administered 2020-04-17 (×2): 10 meq via INTRAVENOUS
  Filled 2020-04-17 (×2): qty 100

## 2020-04-17 MED ORDER — SODIUM CHLORIDE 0.9% FLUSH: 3.0000 mL | Freq: Two times a day (BID) | INTRAVENOUS | Status: DC

## 2020-04-17 MED ORDER — BISACODYL 10 MG RE SUPP: 10.0000 mg | Freq: Every day | RECTAL | Status: DC | PRN

## 2020-04-17 MED ORDER — PIPERACILLIN-TAZOBACTAM 3.375 G IVPB 30 MIN
3.3750 g | Freq: Three times a day (TID) | INTRAVENOUS | Status: DC
  Administered 2020-04-17 – 2020-04-20 (×9): 3.375 g via INTRAVENOUS
  Filled 2020-04-17 (×20): qty 50

## 2020-04-17 MED ORDER — POLYETHYLENE GLYCOL 3350 17 G PO PACK: 17.0000 g | PACK | Freq: Every day | ORAL | Status: DC | PRN

## 2020-04-17 MED ORDER — ISOSORBIDE MONONITRATE ER 30 MG PO TB24
30.0000 mg | ORAL_TABLET | Freq: Every day | ORAL | Status: DC
  Administered 2020-04-18 – 2020-04-20 (×3): 30 mg via ORAL
  Filled 2020-04-17 (×3): qty 1

## 2020-04-17 MED ORDER — ONDANSETRON HCL 4 MG/2ML IJ SOLN
4.0000 mg | Freq: Four times a day (QID) | INTRAMUSCULAR | Status: DC | PRN
  Administered 2020-04-17 – 2020-04-18 (×3): 4 mg via INTRAVENOUS
  Filled 2020-04-17 (×3): qty 2

## 2020-04-17 MED ORDER — MONTELUKAST SODIUM 10 MG PO TABS
10.0000 mg | ORAL_TABLET | Freq: Every day | ORAL | Status: DC
  Administered 2020-04-17 – 2020-04-19 (×3): 10 mg via ORAL
  Filled 2020-04-17 (×3): qty 1

## 2020-04-17 MED ORDER — AMLODIPINE BESYLATE 10 MG PO TABS
10.0000 mg | ORAL_TABLET | Freq: Every day | ORAL | Status: DC
  Administered 2020-04-18 – 2020-04-19 (×2): 10 mg via ORAL
  Filled 2020-04-17: qty 2
  Filled 2020-04-17 (×2): qty 1

## 2020-04-17 MED ORDER — ENOXAPARIN SODIUM 40 MG/0.4ML ~~LOC~~ SOLN
40.0000 mg | SUBCUTANEOUS | Status: DC
  Administered 2020-04-17: 40 mg via SUBCUTANEOUS
  Filled 2020-04-17: qty 0.4

## 2020-04-17 MED ORDER — SODIUM CHLORIDE 0.9 % IV BOLUS
1000.0000 mL | Freq: Once | INTRAVENOUS | Status: AC
  Administered 2020-04-17: 1000 mL via INTRAVENOUS

## 2020-04-17 MED ORDER — FLUTICASONE FUROATE-VILANTEROL 200-25 MCG/INH IN AEPB
1.0000 | INHALATION_SPRAY | Freq: Every day | RESPIRATORY_TRACT | Status: DC
  Administered 2020-04-19 – 2020-04-20 (×2): 1 via RESPIRATORY_TRACT
  Filled 2020-04-17: qty 28

## 2020-04-17 MED ORDER — IOHEXOL 300 MG/ML  SOLN
100.0000 mL | Freq: Once | INTRAMUSCULAR | Status: AC | PRN
  Administered 2020-04-17: 100 mL via INTRAVENOUS

## 2020-04-17 MED ORDER — SODIUM CHLORIDE 0.9% FLUSH: 3.0000 mL | INTRAVENOUS | Status: DC | PRN

## 2020-04-17 MED ORDER — MORPHINE SULFATE (PF) 2 MG/ML IV SOLN
1.0000 mg | INTRAVENOUS | Status: DC | PRN
  Administered 2020-04-19 (×2): 1 mg via INTRAVENOUS
  Filled 2020-04-17 (×2): qty 1

## 2020-04-17 NOTE — ED Notes (Signed)
Called [facility] for transport to [facility]

## 2020-04-17 NOTE — Progress Notes (Signed)
Patient seen and examined.  Patient transferred from [facility] with diverticulitis and abscess.  IR consulted.  Dr. Constance Haw to call surgical team at [facility] for consult.  Continue Zosyn IV fluids n.p.o.  Follow-up labs in a.m.

## 2020-04-17 NOTE — Progress Notes (Signed)
Pt admitted to [facility] from [facility] via [place].  Vital signs stable, pt oriented to room and dept.

## 2020-04-17 NOTE — Progress Notes (Signed)
Interventional Radiology Brief Note:  IR consulted for intra-abdominal fluid collection aspiration and drainage.  CT Abdomen Pelvis reviewed by Dr. Anselm Pancoast who notes no drainable collections at this time, likely inflammed diverticula. No procedure planned at this time.   IR remains available based on clinical course.  Appears surgery is aware of patient.   Brynda Greathouse, MS RD PA-C

## 2020-04-17 NOTE — H&P (Signed)
History and Physical    Patient Demographics:    Richard Webb WLN:[id number] DOB: 1945-08-26 DOA: 04/17/2020  PCP: [name], MD  Patient coming from: Home  I have personally briefly reviewed patient's old medical records in [place]  Chief Complaint: Abdominal pain   Assessment & Plan:     Assessment/Plan Principal Problem:   Diverticulitis of large intestine with abscess Active Problems:   Mixed hyperlipidemia   Benign prostatic hyperplasia     Principal Problem: Colonic diverticulitis with abscess Patient presented with diffuse crampy abdominal pain associated with nausea and fever over the past 1 week.  He also noted mild hematuria.  Was seen by his urologist on 04/09/2020 and had a CT of the abdomen which showed possible diverticulitis and abscess.  Patient subsequently saw his gastroenterologist in [place] who placed on amoxicillin and referred him to see a Psychologist, sport and exercise. Repeat CT of the abdomen in the ER shows progressive inflammation and soft tissue stranding associated with sigmoid diverticulitis.  Similar appearance of pericolonic abscess along the inferior margin of the sigmoid colon extending to the dome of the bladder.  No gas identified within the bladder to suggest fistula.  Second small pericolonic abscess noted along the superior margin of the sigmoid colon. Case was discussed with general surgery who opined that patient will likely need interventional radiology consult for IR guided drainage of abscess.  Patient will be transferred [facility] to [facility] for IR consult as it is not available at our facility currently. -We will place on IV antibiotics -Keep n.p.o. -IV fluid resuscitation -Pain meds as needed  Other Active Problems: Coronary artery disease Patient has history of stent placement x3.  We will continue aspirin  Hypertension Continue amlodipine, metoprolol, losartan  History of ulcerative colitis Continue mesalamine  Gastroesophageal reflux  disease We will place on IV pantoprazole  Obstructive sleep apnea Continue CPAP at night  Asthma/sarcoidosis Continue Breo Ellipta, albuterol as needed, Singulair  ??  Cirrhosis CT of the abdomen reports morphological features of the liver concerning for cirrhosis.  Patient has no history of liver disease in the past.  No history of alcohol abuse, hepatitis.  LFTs on current presentation appear to be within normal limits.  Will need further outpatient work-up as indicated.   DVT prophylaxis: Lovenox Code Status:  Full code Family Communication: N/A  Disposition Plan: Admitted as inpatient for IV antibiotics, interventional radiology, plan is for transfer to [facility] for IR consult. Consults called: N/A Admission status: Inpatient status    HPI:     HPI: Richard Webb is a 74 y.o. male with medical history significant of Coronary artery disease, hyperlipidemia, hypertension, asthma, sarcoidosis, anxiety, sleep apnea, ulcerative colitis who presented to the ER with abdominal pain.  Patient has been having lower abdominal pain starting over a week ago.  Also reported having a fever at home.  He had some associated nausea as well as intermittent diarrhea. He reports having worsening abdominal pain over the last 1 week as well as increased abdominal distention.  No fever, chills.  He did notice mild hematuria last week and was seen by his urologist and thought to have urinary tract infection placed on IV antibiotics.  A CT of the abdomen was done as an outpatient on 04/09/2020 which showed a possible pericolonic abscess from diverticulitis.  Patient was subsequently seen by his gastroenterologist and was asked to be seen by a surgeon for the findings of the abscess on outpatient CT.  He never ended up getting an appointment  with a surgeon but decided to come to the ER for worsening abdominal pain. ED Course:  Vital Signs reviewed on presentation, significant for temperature 99.1, heart rate 83,  blood pressure 122/58, saturation 93% on room air. Labs reviewed, significant for sodium 136, potassium 3.0, BUN 10, creatinine 0.9, LFTs within normal limits, WBC count 12.3, hemoglobin 13.5, hematocrit 42, platelets 252.  Urinalysis is negative.  Blood cultures have been sent. Imaging personally Reviewed, CT of the abdomen pelvis with and without contrast on 04/09/2020 showed pericolonic abscess likely related to diverticulitis between the sigmoid and the urinary bladder with thickening of the wall of the bladder likely secondary to cystitis.  No current signs of colovesicular fistula.  Small gas containing tract extending superiorly from the sigmoid slightly downstream from this area.  Signs of cirrhosis and portal hypertension.    Review of systems:    Review of Systems: As per HPI otherwise 10 point review of systems negative.  All other review of systems is negative except the ones noted above in the HPI.    Past Medical and Surgical History:  Reviewed by me  Past Medical History:  Diagnosis Date  . Arthritis   . Asthma   . Coronary atherosclerosis of native coronary artery    a. DES x 3 RCA and DES mCx 10/11 - [place] - with residual 50-70% stenosis in the distal Cx in AV groove, 70% distal LAD beyond apex, 70% small D1. b. 12/2017: similar results with multivessel CAD. Patent stents. Medical management recommended.   Marland Kitchen GERD (gastroesophageal reflux disease)   . History of kidney stones   . Mixed hyperlipidemia   . Obstructive sleep apnea   . Pulmonary hypertension (HCC)    PASP 50 mmHg  . Restless leg syndrome   . Sarcoidosis   . Statin intolerance   . Ulcerative colitis [facility])     Past Surgical History:  Procedure Laterality Date  . Ankle fracture repair Left 1985  . CARDIAC CATHETERIZATION     stents x4  . CHOLECYSTECTOMY  1970  . CYSTOSCOPY WITH INSERTION OF UROLIFT N/A 08/27/2017   Procedure: CYSTOSCOPY WITH INSERTION OF UROLIFT;  Surgeon: Cleon Gustin, MD;   Location: [facility];  Service: Urology;  Laterality: N/A;  . LAPAROSCOPIC APPENDECTOMY  2013  . LEFT HEART CATH AND CORONARY ANGIOGRAPHY N/A 01/02/2018   Procedure: LEFT HEART CATH AND CORONARY ANGIOGRAPHY;  Surgeon: Troy Sine, MD;  Location: [facility];  Service: Cardiovascular;  Laterality: N/A;  . Right total knee replacement Bilateral 2005     Social History:  Reviewed by me   reports that he has never smoked. He has never used smokeless tobacco. He reports that he does not drink alcohol and does not use drugs.  Allergies:    Allergies  Allergen Reactions  . Bee Venom Anaphylaxis  . Statins     Myalgias - Simvastatin and Rosuvastatin    Family History :   Family History  Problem Relation Age of Onset  . Colon cancer Mother   . CAD Father    Family history reviewed, noted as above, not pertinent to current presentation.   Home Medications:    Prior to Admission medications   Medication Sig Start Date End Date Taking? Authorizing Provider  amLODipine (NORVASC) 10 MG tablet TAKE 1 TABLET BY MOUTH  DAILY 12/02/19   Satira Sark, MD  aspirin EC 81 MG tablet Take 162 mg by mouth daily.    [provider]  BREO ELLIPTA 200-25 MCG/INH AEPB Inhale 1 Inhaler into the lungs daily. 12/25/17   [provider]  carbidopa-levodopa (SINEMET IR) 10-100 MG per tablet Take 1 tablet by mouth at bedtime as needed (restless legs).     [provider]  cetirizine (ZYRTEC) 10 MG tablet Take 10 mg by mouth daily. Patient not taking: Reported on 03/18/2020    [provider]  cyanocobalamin (,VITAMIN B-12,) 1000 MCG/ML injection Inject 1,000 mcg into the muscle every 30 (thirty) days. 02/18/20   [provider]  Cyanocobalamin (B-12) 1000 MCG/ML KIT Inject 1,000 mcg as directed every 30 (thirty) days.    [provider]  Dupilumab (DUPIXENT [place]) Inject into the skin every 14 (fourteen) days.    [provider]  EPINEPHrine  (EPIPEN JR) 0.15 MG/0.3ML injection Inject 0.15 mg into the muscle as needed for anaphylaxis.     [provider]  Evolocumab (REPATHA SURECLICK) 970 MG/ML SOAJ Inject 1 Dose into the skin every 14 (fourteen) days. 03/22/20   Hilty, Nadean Corwin, MD  hydrochlorothiazide (HYDRODIURIL) 25 MG tablet Take 25 mg by mouth daily. 03/10/20   [provider]  HYDROcodone-acetaminophen (NORCO/VICODIN) 5-325 MG tablet Take 1 tablet by mouth every 8 (eight) hours as needed for moderate pain.  07/04/12   [provider]  hydrocortisone cream 1 % Apply 1 application topically 2 (two) times daily as needed for itching (psoriasis).    [provider]  isosorbide mononitrate (IMDUR) 30 MG 24 hr tablet TAKE 1 TABLET BY MOUTH  DAILY 02/24/20   Satira Sark, MD  losartan (COZAAR) 50 MG tablet TAKE 1 TABLET BY MOUTH  DAILY 03/24/20   Satira Sark, MD  meloxicam (MOBIC) 15 MG tablet Take 15 mg by mouth daily. Patient not taking: Reported on 03/18/2020    [provider]  mesalamine (APRISO) 0.375 g 24 hr capsule Take 375 mg by mouth daily. Patient not taking: Reported on 03/18/2020    [provider]  metoprolol tartrate (LOPRESSOR) 25 MG tablet TAKE ONE-HALF TABLET BY  MOUTH TWICE DAILY 10/02/19   Satira Sark, MD  montelukast (SINGULAIR) 10 MG tablet Take 10 mg by mouth at bedtime.    [provider]  nitroGLYCERIN (NITROSTAT) 0.4 MG SL tablet Place 1 tablet (0.4 mg total) under the tongue every 5 (five) minutes x 3 doses as needed for chest pain (if no relief after 3rd dose, proceed to the ED for an evaluation or call 911). 06/26/19   Satira Sark, MD  NON FORMULARY CPAP UAD    [provider]  oxymetazoline (AFRIN) 0.05 % nasal spray Place 1 spray into both nostrils at bedtime.    [provider]  temazepam (RESTORIL) 30 MG capsule Take 30 mg by mouth at bedtime as needed for sleep.     [provider]  Vitamin D,  Ergocalciferol, (DRISDOL) 50000 UNITS CAPS capsule Take 50,000 Units by mouth every 7 (seven) days. fridays    [provider]    Physical Exam:    Physical Exam: Vitals:   04/16/20 1953 04/16/20 1957 04/16/20 2001  BP:   (!) 155/68  Pulse: (!) 54    Resp:   17  Temp: 99.1 F (37.3 C)  99.1 F (37.3 C)  TempSrc: Oral  Oral  SpO2:   99%  Weight:  99.8 kg   Height:  6' (1.829 m)     Constitutional: NAD, calm, comfortable Vitals:   04/16/20 1953 04/16/20 1957 04/16/20  2001  BP:   (!) 155/68  Pulse: (!) 54    Resp:   17  Temp: 99.1 F (37.3 C)  99.1 F (37.3 C)  TempSrc: Oral  Oral  SpO2:   99%  Weight:  99.8 kg   Height:  6' (1.829 m)    Eyes: PERRL, lids and conjunctivae normal ENMT: Mucous membranes are moist. Posterior pharynx clear of any exudate or lesions.Normal dentition.  Neck: normal, supple, no masses, no thyromegaly Respiratory: clear to auscultation bilaterally, no wheezing, no crackles. Normal respiratory effort. No accessory muscle use.  Cardiovascular: Regular rate and rhythm, no murmurs / rubs / gallops. No extremity edema. 2+ pedal pulses. No carotid bruits.  Abdomen: Abdominal distention noted, lower abdominal tenderness with mild guarding, no rigidity. Musculoskeletal: no clubbing / cyanosis. No joint deformity upper and lower extremities. Good ROM, no contractures. Normal muscle tone.  Skin: no rashes, lesions, ulcers. No induration Neurologic: CN 2-12 grossly intact. Sensation intact, DTR normal. Strength 5/5 in all 4.  Psychiatric: Normal judgment and insight. Alert and oriented x 3. Normal mood.    Decubitus Ulcers: Not present on admission Catheters and tubes: None  Data Review:    Labs on Admission: I have personally reviewed following labs and imaging studies  CBC: Recent Labs  Lab 04/16/20 2026 04/17/20 0101  WBC 13.0* 12.3*  NEUTROABS  --  9.6*  HGB 13.8 13.5  HCT 43.3 42.3  MCV 87.1 86.9  PLT 308 638   Basic Metabolic  Panel: Recent Labs  Lab 04/16/20 2026  NA 136  K 3.0*  CL 97*  CO2 26  GLUCOSE 109*  BUN 10  CREATININE 0.99  CALCIUM 9.3   GFR: Estimated Creatinine Clearance: 80.1 mL/min (by C-G formula based on SCr of 0.99 mg/dL). Liver Function Tests: Recent Labs  Lab 04/16/20 2026  AST 20  ALT 18  ALKPHOS 79  BILITOT 2.1*  PROT 8.2*  ALBUMIN 4.4   Recent Labs  Lab 04/16/20 2026  LIPASE 31   No results for input(s): AMMONIA in the last 168 hours. Coagulation Profile: No results for input(s): INR, PROTIME in the last 168 hours. Cardiac Enzymes: No results for input(s): CKTOTAL, CKMB, CKMBINDEX, TROPONINI in the last 168 hours. BNP (last 3 results) No results for input(s): PROBNP in the last 8760 hours. HbA1C: No results for input(s): HGBA1C in the last 72 hours. CBG: No results for input(s): GLUCAP in the last 168 hours. Lipid Profile: No results for input(s): CHOL, HDL, LDLCALC, TRIG, CHOLHDL, LDLDIRECT in the last 72 hours. Thyroid Function Tests: No results for input(s): TSH, T4TOTAL, FREET4, T3FREE, THYROIDAB in the last 72 hours. Anemia Panel: No results for input(s): VITAMINB12, FOLATE, FERRITIN, TIBC, IRON, RETICCTPCT in the last 72 hours. Urine analysis:    Component Value Date/Time   COLORURINE AMBER (A) 08/31/2017 0000   APPEARANCEUR Clear 03/18/2020 0937   LABSPEC 1.018 08/31/2017 0000   PHURINE 5.0 08/31/2017 0000   GLUCOSEU Negative 03/18/2020 0937   HGBUR LARGE (A) 08/31/2017 0000   BILIRUBINUR Negative 03/18/2020 [place] 08/31/2017 0000   PROTEINUR Negative 03/18/2020 0937   PROTEINUR 100 (A) 08/31/2017 0000   NITRITE Negative 03/18/2020 0937   NITRITE NEGATIVE 08/31/2017 0000   LEUKOCYTESUR Negative 03/18/2020 7564     Imaging Results:      Radiological Exams on Admission: No results found.    Lynetta Mare MD [facility]  If 7PM-7AM, please contact night-coverage   04/17/2020, 3:03 AM

## 2020-04-17 NOTE — Consult Note (Signed)
Reason for Consult:diverticulitis Referring Physician: Dr Hartley Barefoot is an 74 y.o. male.  HPI: 72 yom who has colonoscopy a couple months ago where he was noted to have "ulcers" and polyps.  I asked him to obtain this report from Dr Marya Landry in [place].  For past week he has noted some abdominal pain, crampy diffuse with alternating diarrhea and constipation.  He was seen by urology for some hematuria, no pneumaturia and underwent a ct scan that showed possible pericolonic abscess among other findings. He then saw his gi physician again who started abx and was going to refer to surgery.  Before that could occur he had worsening of pain with nothing helping, low grade fever to 100.2 at home and presented to the [facility].  He underwent another ct scan with what appears to be diverticulitis and was transferred [facility] to see.   Past Medical History:  Diagnosis Date  . Arthritis   . Asthma   . Coronary atherosclerosis of native coronary artery    a. DES x 3 RCA and DES mCx 10/11 - [place] - with residual 50-70% stenosis in the distal Cx in AV groove, 70% distal LAD beyond apex, 70% small D1. b. 12/2017: similar results with multivessel CAD. Patent stents. Medical management recommended.   Mar[place] Kitchen GERD (gastroesophageal reflux disease)   . History of kidney stones   . Mixed hyperlipidemia   . Obstructive sleep apnea   . Pulmonary hypertension (HCC)    PASP 50 mmHg  . Restless leg syndrome   . Sarcoidosis   . Statin intolerance   . Ulcerative colitis [facility])     Past Surgical History:  Procedure Laterality Date  . Ankle fracture repair Left 1985  . CARDIAC CATHETERIZATION     stents x4  . CHOLECYSTECTOMY  1970  . CYSTOSCOPY WITH INSERTION OF UROLIFT N/A 08/27/2017   Procedure: CYSTOSCOPY WITH INSERTION OF UROLIFT;  Surgeon: Cleon Gustin, MD;  Location: [facility];  Service: Urology;  Laterality: N/A;  . LAPAROSCOPIC APPENDECTOMY  2013  . LEFT HEART CATH AND CORONARY  ANGIOGRAPHY N/A 01/02/2018   Procedure: LEFT HEART CATH AND CORONARY ANGIOGRAPHY;  Surgeon: Troy Sine, MD;  Location: [facility];  Service: Cardiovascular;  Laterality: N/A;  . Right total knee replacement Bilateral 2005    Family History  Problem Relation Age of Onset  . Colon cancer Mother   . CAD Father     Social History:  reports that he has never smoked. He has never used smokeless tobacco. He reports that he does not drink alcohol and does not use drugs.  Allergies:  Allergies  Allergen Reactions  . Bee Venom Anaphylaxis  . Statins     Myalgias - Simvastatin and Rosuvastatin    Medications: I have reviewed the patient's current medications.  Results for orders placed or performed during the hospital encounter of 04/17/20 (from the past 48 hour(s))  Lipase, blood     Status: None   Collection Time: 04/16/20  8:26 PM  Result Value Ref Range   Lipase 31 11 - 51 U/L    Comment: Performed at [facility], [address], [place], [place] [address]  Comprehensive metabolic panel     Status: Abnormal   Collection Time: 04/16/20  8:26 PM  Result Value Ref Range   Sodium 136 135 - 145 mmol/L   Potassium 3.0 (L) 3.5 - 5.1 mmol/L   Chloride 97 (L) 98 - 111 mmol/L  CO2 26 22 - 32 mmol/L   Glucose, Bld 109 (H) 70 - 99 mg/dL    Comment: Glucose reference range applies only to samples taken after fasting for at least 8 hours.   BUN 10 8 - 23 mg/dL   Creatinine, Ser 0.99 0.61 - 1.24 mg/dL   Calcium 9.3 8.9 - 10.3 mg/dL   Total Protein 8.2 (H) 6.5 - 8.1 g/dL   Albumin 4.4 3.5 - 5.0 g/dL   AST 20 15 - 41 U/L   ALT 18 0 - 44 U/L   Alkaline Phosphatase 79 38 - 126 U/L   Total Bilirubin 2.1 (H) 0.3 - 1.2 mg/dL   GFR calc non Af Amer >60 >60 mL/min   GFR calc Af Amer >60 >60 mL/min   Anion gap 13 5 - 15    Comment: Performed at [facility], [address], [place], [place] [address]  CBC     Status: Abnormal   Collection Time: 04/16/20  8:26 PM  Result Value Ref  Range   WBC 13.0 (H) 4.0 - 10.5 K/uL   RBC 4.97 4.22 - 5.81 MIL/uL   Hemoglobin 13.8 13.0 - 17.0 g/dL   HCT 43.3 39 - 52 %   MCV 87.1 80.0 - 100.0 fL   MCH 27.8 26.0 - 34.0 pg   MCHC 31.9 30.0 - 36.0 g/dL   RDW 15.3 11.5 - 15.5 %   Platelets 308 150 - 400 K/uL   nRBC 0.0 0.0 - 0.2 %    Comment: Performed at [facility], [address], [place], [place] [address]  CBC with Differential/Platelet     Status: Abnormal   Collection Time: 04/17/20  1:01 AM  Result Value Ref Range   WBC 12.3 (H) 4.0 - 10.5 K/uL   RBC 4.87 4.22 - 5.81 MIL/uL   Hemoglobin 13.5 13.0 - 17.0 g/dL   HCT 42.3 39 - 52 %   MCV 86.9 80.0 - 100.0 fL   MCH 27.7 26.0 - 34.0 pg   MCHC 31.9 30.0 - 36.0 g/dL   RDW 15.3 11.5 - 15.5 %   Platelets 252 150 - 400 K/uL   nRBC 0.0 0.0 - 0.2 %   Neutrophils Relative % 78 %   Neutro Abs 9.6 (H) 1.7 - 7.7 K/uL   Lymphocytes Relative 11 %   Lymphs Abs 1.3 0.7 - 4.0 K/uL   Monocytes Relative 8 %   Monocytes Absolute 1.0 0 - 1 K/uL   Eosinophils Relative 2 %   Eosinophils Absolute 0.2 0 - 0 K/uL   Basophils Relative 0 %   Basophils Absolute 0.0 0 - 0 K/uL   Immature Granulocytes 1 %   Abs Immature Granulocytes 0.06 0.00 - 0.07 K/uL    Comment: Performed at [facility], [address], [place], [place] [address]  Lactic acid, plasma     Status: None   Collection Time: 04/17/20  1:01 AM  Result Value Ref Range   Lactic Acid, Venous 1.5 0.5 - 1.9 mmol/L    Comment: Performed at [facility], [address], [place], [place] [address]  SARS Coronavirus 2 by RT PCR (hospital order, performed in [facility] hospital lab) Nasopharyngeal Nasopharyngeal Swab     Status: None   Collection Time: 04/17/20  2:41 AM   Specimen: Nasopharyngeal Swab  Result Value Ref Range   SARS Coronavirus 2 NEGATIVE NEGATIVE    Comment: (NOTE) SARS-CoV-2 target nucleic acids are NOT DETECTED.  The SARS-CoV-2 RNA is generally  detectable in upper and lower respiratory specimens during the acute  phase of infection. The lowest concentration of SARS-CoV-2 viral copies this assay can detect is 250 copies / mL. A negative result does not preclude SARS-CoV-2 infection and should not be used as the sole basis for treatment or other patient management decisions.  A negative result may occur with improper specimen collection / handling, submission of specimen other than nasopharyngeal swab, presence of viral mutation(s) within the areas targeted by this assay, and inadequate number of viral copies (<250 copies / mL). A negative result must be combined with clinical observations, patient history, and epidemiological information.  Fact Sheet for Patients:   StrictlyIdeas.no  Fact Sheet for Healthcare Providers: BankingDealers.co.za  This test is not yet approved or  cleared by the [place] FDA and has been authorized for detection and/or diagnosis of SARS-CoV-2 by FDA under an Emergency Use Authorization (EUA).  This EUA will remain in effect (meaning this test can be used) for the duration of the COVID-19 declaration under Section 564(b)(1) of the Act, 21 U.S.C. section 360bbb-3(b)(1), unless the authorization is terminated or revoked sooner.  Performed at [facility] [address] [place], [place] [address]   Urinalysis, Routine w reflex microscopic Urine, Clean Catch     Status: Abnormal   Collection Time: 04/17/20  2:50 AM  Result Value Ref Range   Color, Urine YELLOW YELLOW   APPearance CLEAR CLEAR   Specific Gravity, Urine 1.008 1.005 - 1.030   pH 6.0 5.0 - 8.0   Glucose, UA NEGATIVE NEGATIVE mg/dL   Hgb urine dipstick SMALL (A) NEGATIVE   Bilirubin Urine NEGATIVE NEGATIVE   Ketones, ur NEGATIVE NEGATIVE mg/dL   Protein, ur NEGATIVE NEGATIVE mg/dL   Nitrite NEGATIVE NEGATIVE   Leukocytes,Ua NEGATIVE NEGATIVE   RBC / HPF 0-5 0 - 5 RBC/hpf   WBC, UA 0-5 0 - 5 WBC/hpf   Bacteria, UA NONE SEEN NONE SEEN    Comment:  Performed at [facility], [address] [place], [place] [address]    CT ABDOMEN PELVIS W CONTRAST  Result Date: 04/17/2020 CLINICAL DATA:  Evaluate for abdominal abscess/infection. EXAM: CT ABDOMEN AND PELVIS WITH CONTRAST TECHNIQUE: Multidetector CT imaging of the abdomen and pelvis was performed using the standard protocol following bolus administration of intravenous contrast. CONTRAST:  121m OMNIPAQUE IOHEXOL 300 MG/ML  SOLN COMPARISON:  04/09/2020 FINDINGS: Lower chest: No acute abnormality identified. Chronic interstitial thickening and reticulation is noted within both lung bases which is progressive when compared with 05/29/2016. Left lower lobe lung nodule is again noted measuring 0.7 cm. Unchanged from 04/09/2020. This was also likely present on more remote exam from 05/29/2016. Hepatobiliary: Nodular appearance of the liver concerning for cirrhosis. There is no focal liver abnormality identified. Previous cholecystectomy. No bile duct dilatation. Pancreas: Unremarkable. No pancreatic ductal dilatation or surrounding inflammatory changes. Spleen: Normal in size without focal abnormality. Adrenals/Urinary Tract: Normal appearance of the adrenal glands. Multiple bilateral kidney cysts are identified the largest arises from the lateral right mid kidney measuring 4.7 cm. No hydronephrosis identified bilaterally. Again seen is abnormal wall thickening involving the dome of bladder with adjacent diverticular inflammation. Stomach/Bowel: Small hiatal hernia. Stomach is otherwise unremarkable. Small bowel loops are nondilated without wall thickening or inflammation. Proximal colon is unremarkable. Wall thickening and inflammation associated with the mid sigmoid colon is again noted compatible with acute diverticulitis. Along the superior margin of the sigmoid colon, in the area of the previously characterized diverticulum, there has  been progressive inflammation with increased soft tissue stranding. This  area now contains fluid and gas with small tracks communicating with 2 separate segments of sigmoid colon likely reflecting small abscess with fistulization. On today's study this measures 2.3 by 2.2 cm, image 50/5. 1.7 x 1.6 cm. Along the inferior margin of the same segment of sigmoid colon is an area of extra colonic gas and fluid which is contiguous with the dome of bladder. No gas seen within the lumen of the bladder. Vascular/Lymphatic: Aortic atherosclerosis without aneurysm. There is no significant adenopathy within the scratch no adenopathy identified within the abdomen or pelvis. Reproductive: Seed implants noted within the prostate gland. Other: No significant free fluid. No pneumoperitoneum. Left inguinal hernia contains fat only. Musculoskeletal: Lumbar degenerative disc disease. No acute or suspicious osseous findings. IMPRESSION: 1. Since 04/09/2020 there has been progressive inflammation and soft tissue stranding associated with the previously characterized sigmoid diverticulitis. 2. Similar appearance of previously characterized pericolonic abscess along the inferior margin of the sigmoid colon which extends to the dome of bladder resulting in focal bladder wall thickening, likely secondary cystitis. No gas identified within the bladder at this time to suggest patent fistula. 3. A second pericolonic abscess is noted along the superior margin of the sigmoid colon in the area of previously characterized large sigmoid diverticula. This abscess is small and not likely amenable to percutaneous drainage at this time. 4. 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 5. Aortic atherosclerosis. Aortic Atherosclerosis (ICD10-I70.0). Electronically Signed   By: Kerby Moors M.D.   On: 04/17/2020 06:05    Review of Systems  Constitutional: Positive for fever.  Gastrointestinal: Positive for abdominal pain, constipation, diarrhea and nausea. Negative for vomiting.  Genitourinary: Positive  for hematuria.  All other systems reviewed and are negative.  Blood pressure 111/65, pulse 71, temperature 98.7 F (37.1 C), temperature source Oral, resp. rate 16, height 6' (1.829 m), weight 99.8 kg, SpO2 96 %. Physical Exam Constitutional:      General: He is not in acute distress.    Appearance: He is well-developed.  HENT:     Head: Normocephalic and atraumatic.     Mouth/Throat:     Mouth: Mucous membranes are moist.     Pharynx: Oropharynx is clear.  Eyes:     Extraocular Movements: Extraocular movements intact.  Cardiovascular:     Rate and Rhythm: Normal rate and regular rhythm.  Pulmonary:     Effort: Pulmonary effort is normal.     Breath sounds: No wheezing.  Abdominal:     General: Bowel sounds are normal. There is no distension.     Palpations: Abdomen is soft.     Tenderness: There is abdominal tenderness in the periumbilical area, suprapubic area and left lower quadrant. There is no rebound.  Skin:    General: Skin is warm and dry.     Capillary Refill: Capillary refill takes less than 2 seconds.  Neurological:     General: No focal deficit present.     Mental Status: He is alert.  Psychiatric:        Mood and Affect: Mood normal.        Behavior: Behavior normal.     Assessment/Plan: Complicated diverticulitis -need colonoscopy report  -iv abx, no indication for surgery at this time, npo except ice chips, possibly repeat ct scan next several days -there is nothing to drain for IR -can take po meds -would start lovenox   Rolm Bookbinder 04/17/2020, 2:56 PM

## 2020-04-17 NOTE — ED Notes (Signed)
Report given to [facility] 

## 2020-04-17 NOTE — ED Provider Notes (Signed)
[facility] EMERGENCY DEPARTMENT Provider Note   CSN: [id number] Arrival date & time: 04/16/20  1831     History Chief Complaint  Patient presents with  . Abdominal Pain    fever     Richard Webb is a 74 y.o. male.  Patient with history of CAD with stents, GERD, sleep apnea, ulcerative colitis here with 1 week history of progressively worsening abdominal pain associated with nausea and fever which is new today.  Reports diffuse crampy abdominal pain worsening over the past 1 week.  He was having hematuria as well which led him to see his urologist on August 20 ordered a CT scan.  He did not get the results of his CT scan but saw his gastroenterologist in [place] who wanted him to see a surgeon based on possible diverticulitis and pelvic abscess.  He has been on amoxicillin for the past 3 days.  His gastroenterologist in [place] told him to see a surgeon regarding the fluid collection on his belly CT.  He was never given these results by urology.  He comes in today with worsening abdominal pain, new fever to 100.2 as well as nausea.  Still having some blood in the urine.  Denies any vomiting.  Denies any chest pain or shortness of breath.  Denies any pain with urination.  He has been having intermittent diarrhea as well. Previous appendectomy and cholecystectomy  The history is provided by the patient.  Abdominal Pain Associated symptoms: diarrhea, fever and nausea   Associated symptoms: no chest pain, no dysuria, no hematuria, no shortness of breath and no vomiting        Past Medical History:  Diagnosis Date  . Arthritis   . Asthma   . Coronary atherosclerosis of native coronary artery    a. DES x 3 RCA and DES mCx 10/11 - [place] - with residual 50-70% stenosis in the distal Cx in AV groove, 70% distal LAD beyond apex, 70% small D1. b. 12/2017: similar results with multivessel CAD. Patent stents. Medical management recommended.   Marland Kitchen GERD (gastroesophageal reflux disease)     . History of kidney stones   . Mixed hyperlipidemia   . Obstructive sleep apnea   . Pulmonary hypertension (HCC)    PASP 50 mmHg  . Restless leg syndrome   . Sarcoidosis   . Statin intolerance   . Ulcerative colitis [facility])     Patient Active Problem List   Diagnosis Date Noted  . Benign prostatic hyperplasia 03/18/2020  . Gross hematuria 03/18/2020  . OAB (overactive bladder) 03/18/2020  . Dyspnea on exertion 07/21/2019  . Accelerating angina ([place])   . Coronary atherosclerosis of native coronary artery 10/10/2012  . Mixed hyperlipidemia 10/10/2012    Past Surgical History:  Procedure Laterality Date  . Ankle fracture repair Left 1985  . CARDIAC CATHETERIZATION     stents x4  . CHOLECYSTECTOMY  1970  . CYSTOSCOPY WITH INSERTION OF UROLIFT N/A 08/27/2017   Procedure: CYSTOSCOPY WITH INSERTION OF UROLIFT;  Surgeon: Cleon Gustin, MD;  Location: [facility];  Service: Urology;  Laterality: N/A;  . LAPAROSCOPIC APPENDECTOMY  2013  . LEFT HEART CATH AND CORONARY ANGIOGRAPHY N/A 01/02/2018   Procedure: LEFT HEART CATH AND CORONARY ANGIOGRAPHY;  Surgeon: Troy Sine, MD;  Location: [place] [facility];  Service: Cardiovascular;  Laterality: N/A;  . Right total knee replacement Bilateral 2005       Family History  Problem Relation Age of Onset  . Colon cancer Mother   .  CAD Father     Social History   Tobacco Use  . Smoking status: Never Smoker  . Smokeless tobacco: Never Used  Vaping Use  . Vaping Use: Never used  Substance Use Topics  . Alcohol use: No  . Drug use: No    Home Medications Prior to Admission medications   Medication Sig Start Date End Date Taking? Authorizing Provider  amLODipine (NORVASC) 10 MG tablet TAKE 1 TABLET BY MOUTH  DAILY 12/02/19   Satira Sark, MD  aspirin EC 81 MG tablet Take 162 mg by mouth daily.    [provider]  BREO ELLIPTA 200-25 MCG/INH AEPB Inhale 1 Inhaler into the lungs daily. 12/25/17   [provider]  carbidopa-levodopa (SINEMET IR) 10-100 MG per tablet Take 1 tablet by mouth at bedtime as needed (restless legs).     [provider]  cetirizine (ZYRTEC) 10 MG tablet Take 10 mg by mouth daily. Patient not taking: Reported on 03/18/2020    [provider]  cyanocobalamin (,VITAMIN B-12,) 1000 MCG/ML injection Inject 1,000 mcg into the muscle every 30 (thirty) days. 02/18/20   [provider]  Cyanocobalamin (B-12) 1000 MCG/ML KIT Inject 1,000 mcg as directed every 30 (thirty) days.    [provider]  Dupilumab (DUPIXENT [place]) Inject into the skin every 14 (fourteen) days.    [provider]  EPINEPHrine (EPIPEN JR) 0.15 MG/0.3ML injection Inject 0.15 mg into the muscle as needed for anaphylaxis.     [provider]  Evolocumab (REPATHA SURECLICK) 846 MG/ML SOAJ Inject 1 Dose into the skin every 14 (fourteen) days. 03/22/20   Hilty, Nadean Corwin, MD  hydrochlorothiazide (HYDRODIURIL) 25 MG tablet Take 25 mg by mouth daily. 03/10/20   [provider]  HYDROcodone-acetaminophen (NORCO/VICODIN) 5-325 MG tablet Take 1 tablet by mouth every 8 (eight) hours as needed for moderate pain.  07/04/12   [provider]  hydrocortisone cream 1 % Apply 1 application topically 2 (two) times daily as needed for itching (psoriasis).    [provider]  isosorbide mononitrate (IMDUR) 30 MG 24 hr tablet TAKE 1 TABLET BY MOUTH  DAILY 02/24/20   Satira Sark, MD  losartan (COZAAR) 50 MG tablet TAKE 1 TABLET BY MOUTH  DAILY 03/24/20   Satira Sark, MD  meloxicam (MOBIC) 15 MG tablet Take 15 mg by mouth daily. Patient not taking: Reported on 03/18/2020    [provider]  mesalamine (APRISO) 0.375 g 24 hr capsule Take 375 mg by mouth daily. Patient not taking: Reported on 03/18/2020    [provider]  metoprolol tartrate (LOPRESSOR) 25 MG tablet TAKE ONE-HALF TABLET BY  MOUTH TWICE DAILY 10/02/19   Satira Sark, MD    montelukast (SINGULAIR) 10 MG tablet Take 10 mg by mouth at bedtime.    [provider]  nitroGLYCERIN (NITROSTAT) 0.4 MG SL tablet Place 1 tablet (0.4 mg total) under the tongue every 5 (five) minutes x 3 doses as needed for chest pain (if no relief after 3rd dose, proceed to the ED for an evaluation or call 911). 06/26/19   Satira Sark, MD  NON FORMULARY CPAP UAD    [provider]  oxymetazoline (AFRIN) 0.05 % nasal spray Place 1 spray into both nostrils at bedtime.    [provider]  temazepam (RESTORIL) 30 MG capsule Take 30 mg by mouth at bedtime as needed for sleep.     [provider]  Vitamin D,  Ergocalciferol, (DRISDOL) 50000 UNITS CAPS capsule Take 50,000 Units by mouth every 7 (seven) days. fridays    [provider]    Allergies    Bee venom and Statins  Review of Systems   Review of Systems  Constitutional: Positive for activity change, appetite change and fever.  HENT: Negative for congestion and rhinorrhea.   Respiratory: Negative for chest tightness and shortness of breath.   Cardiovascular: Negative for chest pain.  Gastrointestinal: Positive for abdominal pain, diarrhea and nausea. Negative for vomiting.  Genitourinary: Negative for dysuria and hematuria.  Musculoskeletal: Negative for arthralgias and myalgias.  Skin: Negative for rash.  Neurological: Negative for dizziness, weakness and headaches.   all other systems are negative except as noted in the HPI and PMH.    Physical Exam Updated Vital Signs BP (!) 155/68 (BP Location: Right Arm)   Pulse (!) 54   Temp 99.1 F (37.3 C) (Oral)   Resp 17   Ht 6' (1.829 m)   Wt 99.8 kg   SpO2 99%   BMI 29.84 kg/m   Physical Exam Vitals and nursing note reviewed.  Constitutional:      General: He is not in acute distress.    Appearance: He is well-developed. He is obese.  HENT:     Head: Normocephalic and atraumatic.     Mouth/Throat:     Pharynx: No  oropharyngeal exudate.  Eyes:     Conjunctiva/sclera: Conjunctivae normal.     Pupils: Pupils are equal, round, and reactive to light.  Neck:     Comments: No meningismus. Cardiovascular:     Rate and Rhythm: Normal rate and regular rhythm.     Heart sounds: Normal heart sounds. No murmur heard.   Pulmonary:     Effort: Pulmonary effort is normal. No respiratory distress.     Breath sounds: Normal breath sounds.  Abdominal:     Palpations: Abdomen is soft.     Tenderness: There is abdominal tenderness. There is guarding. There is no rebound.     Comments: Diffuse tenderness with voluntary guarding throughout, no rebound  Genitourinary:    Comments: Single testicle present, no tenderness Musculoskeletal:        General: No tenderness. Normal range of motion.     Cervical back: Normal range of motion and neck supple.  Skin:    General: Skin is warm.  Neurological:     Mental Status: He is alert and oriented to person, place, and time.     Cranial Nerves: No cranial nerve deficit.     Motor: No abnormal muscle tone.     Coordination: Coordination normal.     Comments: No ataxia on finger to nose bilaterally. No pronator drift. 5/5 strength throughout. CN 2-12 intact.Equal grip strength. Sensation intact.   Psychiatric:        Behavior: Behavior normal.     ED Results / Procedures / Treatments   Labs (all labs ordered are listed, but only abnormal results are displayed) Labs Reviewed  COMPREHENSIVE METABOLIC PANEL - Abnormal; Notable for the following components:      Result Value   Potassium 3.0 (*)    Chloride 97 (*)    Glucose, Bld 109 (*)    Total Protein 8.2 (*)    Total Bilirubin 2.1 (*)    All other components within normal limits  CBC - Abnormal; Notable for the following components:   WBC 13.0 (*)    All other components within normal limits  URINALYSIS, ROUTINE  W REFLEX MICROSCOPIC - Abnormal; Notable for the following components:   Hgb urine dipstick SMALL  (*)    All other components within normal limits  CBC WITH DIFFERENTIAL/PLATELET - Abnormal; Notable for the following components:   WBC 12.3 (*)    Neutro Abs 9.6 (*)    All other components within normal limits  SARS CORONAVIRUS 2 BY RT PCR (HOSPITAL ORDER, [place] [facility])  CULTURE, BLOOD (ROUTINE X 2)  CULTURE, BLOOD (ROUTINE X 2)  LIPASE, BLOOD  LACTIC ACID, PLASMA    EKG EKG Interpretation  Date/Time:  Saturday April 17 2020 00:57:54 EDT Ventricular Rate:  81 PR Interval:    QRS Duration: 107 QT Interval:  376 QTC Calculation: 437 R Axis:   35 Text Interpretation: Sinus rhythm Multiform ventricular premature complexes Borderline prolonged PR interval Low voltage, precordial leads No significant change was found Confirmed by Ezequiel Essex [phone]) on 04/17/2020 1:00:30 AM   Radiology CT ABDOMEN PELVIS W CONTRAST  Result Date: 04/17/2020 CLINICAL DATA:  Evaluate for abdominal abscess/infection. EXAM: CT ABDOMEN AND PELVIS WITH CONTRAST TECHNIQUE: Multidetector CT imaging of the abdomen and pelvis was performed using the standard protocol following bolus administration of intravenous contrast. CONTRAST:  152m OMNIPAQUE IOHEXOL 300 MG/ML  SOLN COMPARISON:  04/09/2020 FINDINGS: Lower chest: No acute abnormality identified. Chronic interstitial thickening and reticulation is noted within both lung bases which is progressive when compared with 05/29/2016. Left lower lobe lung nodule is again noted measuring 0.7 cm. Unchanged from 04/09/2020. This was also likely present on more remote exam from 05/29/2016. Hepatobiliary: Nodular appearance of the liver concerning for cirrhosis. There is no focal liver abnormality identified. Previous cholecystectomy. No bile duct dilatation. Pancreas: Unremarkable. No pancreatic ductal dilatation or surrounding inflammatory changes. Spleen: Normal in size without focal abnormality. Adrenals/Urinary Tract: Normal appearance of the  adrenal glands. Multiple bilateral kidney cysts are identified the largest arises from the lateral right mid kidney measuring 4.7 cm. No hydronephrosis identified bilaterally. Again seen is abnormal wall thickening involving the dome of bladder with adjacent diverticular inflammation. Stomach/Bowel: Small hiatal hernia. Stomach is otherwise unremarkable. Small bowel loops are nondilated without wall thickening or inflammation. Proximal colon is unremarkable. Wall thickening and inflammation associated with the mid sigmoid colon is again noted compatible with acute diverticulitis. Along the superior margin of the sigmoid colon, in the area of the previously characterized diverticulum, there has been progressive inflammation with increased soft tissue stranding. This area now contains fluid and gas with small tracks communicating with 2 separate segments of sigmoid colon likely reflecting small abscess with fistulization. On today's study this measures 2.3 by 2.2 cm, image 50/5. 1.7 x 1.6 cm. Along the inferior margin of the same segment of sigmoid colon is an area of extra colonic gas and fluid which is contiguous with the dome of bladder. No gas seen within the lumen of the bladder. Vascular/Lymphatic: Aortic atherosclerosis without aneurysm. There is no significant adenopathy within the scratch no adenopathy identified within the abdomen or pelvis. Reproductive: Seed implants noted within the prostate gland. Other: No significant free fluid. No pneumoperitoneum. Left inguinal hernia contains fat only. Musculoskeletal: Lumbar degenerative disc disease. No acute or suspicious osseous findings. IMPRESSION: 1. Since 04/09/2020 there has been progressive inflammation and soft tissue stranding associated with the previously characterized sigmoid diverticulitis. 2. Similar appearance of previously characterized pericolonic abscess along the inferior margin of the sigmoid colon which extends to the dome of bladder  resulting in focal bladder wall thickening,  likely secondary cystitis. No gas identified within the bladder at this time to suggest patent fistula. 3. A second pericolonic abscess is noted along the superior margin of the sigmoid colon in the area of previously characterized large sigmoid diverticula. This abscess is small and not likely amenable to percutaneous drainage at this time. 4. 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 5. Aortic atherosclerosis. Aortic Atherosclerosis (ICD10-I70.0). Electronically Signed   By: Kerby Moors M.D.   On: 04/17/2020 06:05    Procedures .Critical Care Performed by: Ezequiel Essex, MD Authorized by: Ezequiel Essex, MD   Critical care provider statement:    Critical care time (minutes):  35   Critical care was necessary to treat or prevent imminent or life-threatening deterioration of the following conditions:  Sepsis   Critical care was time spent personally by me on the following activities:  Discussions with consultants, evaluation of patient's response to treatment, examination of patient, ordering and performing treatments and interventions, ordering and review of laboratory studies, ordering and review of radiographic studies, pulse oximetry, re-evaluation of patient's condition, obtaining history from patient or surrogate and review of old charts   (including critical care time)  Medications Ordered in ED Medications  sodium chloride 0.9 % bolus 1,000 mL (has no administration in time range)  fentaNYL (SUBLIMAZE) injection 50 mcg (has no administration in time range)  ondansetron (ZOFRAN) injection 4 mg (has no administration in time range)  piperacillin-tazobactam (ZOSYN) IVPB 3.375 g (has no administration in time range)    ED Course  I have reviewed the triage vital signs and the nursing notes.  Pertinent labs & imaging results that were available during my care of the patient were reviewed by me and considered in my  medical decision making (see chart for details).    MDM Rules/Calculators/A&P                         1 week progressively worsening lower abdominal pain nausea and fever.  CT scan from August 21 reviewed and shows a pelvic fluid collection between sigmoid colon and bladder concerning for abscess.   "1. Pericolonic abscess likely related to diverticulitis between the sigmoid and the urinary bladder with thickening of the wall of the urinary bladder, likely secondary cystitis  There is also some gas pockets along the sigmoid colon."  Discussed with Dr. Constance Haw of general surgery.  She agrees with repeat CT scan antibiotics and hospitalist admission.  Will likely need IR drainage. She will consult on the patient in the morning as necessary.  Patient started on IV fluids and IV antibiotics.  Repeat CT scan will be obtained.  CT scan today shows increased evidence of diverticulitis with abscess along sigmoid colon.  Second abscess noted along sigmoid colon as well.  Continue IV fluids and IV antibiotics.  No IR available at this facility.  Will arrange transfer to [facility] for possibility of IR drainage.  D/w Dr. Scherrie November.   Final Clinical Impression(s) / ED Diagnoses Final diagnoses:  Intra-abdominal abscess [facility])    Rx / [place] Orders ED Discharge Orders    None       Mckyla Deckman, Annie Main, MD 04/17/20 [id number]

## 2020-04-17 NOTE — Progress Notes (Signed)
Patient ID: Richard Webb, male   DOB: 1945-10-08, 74 y.o.   MRN: [id number] Patient admitted early this morning for abdominal pain and was found to have colonic diverticulitis with abscess.  He has been started on antibiotics and he is waiting to be transferred to [facility] for IR guided drainage of abscess.  Patient seen and examined at bedside and plan of care discussed with him.  I reviewed patient's medical records, labs, current vitals, medications myself.  Patient currently denies worsening abdominal pain or any nausea.  Continue antibiotics.  Repeat a.m. labs.  Follow IR recommendations once patient gets to [facility].

## 2020-04-17 NOTE — Progress Notes (Signed)
[facility]  Called by Dr. Wyvonnia Dusky last night about patient prior to CT being performed. Had prior CT with small abscess and presenting with worsening symptoms. Recommended hospitalist admission, IV antibiotics, and had mentioned possible need for IR pending CT, and that I would see in the AM.   Unfortunately patient has been transferred down for possible IR prior to be seeing him this AM. On the reviewing CT this AM (which had not been done last night) there is no obvious drainable collections.  The hospitalist caring for the patient were unaware that IR procedures could be arranged from [facility] and have the patient return to [facility].  I have let Dr. Donne Hazel know from [facility] pending the Hospitalist service needing him to consult.   Curlene Labrum, MD [facility] [address] [place], [place] [address] [phone] (office)

## 2020-04-17 NOTE — Progress Notes (Signed)
Pt placed on Cardiac monitoring with 2nd verifier and SCDs placed.

## 2020-04-18 LAB — CBC
HCT: 34.7 % — ABNORMAL LOW (ref 39.0–52.0)
Hemoglobin: 10.9 g/dL — ABNORMAL LOW (ref 13.0–17.0)
MCH: 27 pg (ref 26.0–34.0)
MCHC: 31.4 g/dL (ref 30.0–36.0)
MCV: 85.9 fL (ref 80.0–100.0)
Platelets: 254 10*3/uL (ref 150–400)
RBC: 4.04 MIL/uL — ABNORMAL LOW (ref 4.22–5.81)
RDW: 15.1 % (ref 11.5–15.5)
WBC: 10.1 10*3/uL (ref 4.0–10.5)
nRBC: 0 % (ref 0.0–0.2)

## 2020-04-18 LAB — BASIC METABOLIC PANEL
Anion gap: 7 (ref 5–15)
BUN: 7 mg/dL — ABNORMAL LOW (ref 8–23)
CO2: 27 mmol/L (ref 22–32)
Calcium: 8.8 mg/dL — ABNORMAL LOW (ref 8.9–10.3)
Chloride: 101 mmol/L (ref 98–111)
Creatinine, Ser: 1.01 mg/dL (ref 0.61–1.24)
GFR calc Af Amer: >60 mL/min (ref >60)
GFR calc non Af Amer: >60 mL/min (ref >60)
Glucose, Bld: 98 mg/dL (ref 70–99)
Potassium: 3.6 mmol/L (ref 3.5–5.1)
Sodium: 135 mmol/L (ref 135–145)

## 2020-04-18 LAB — C-REACTIVE PROTEIN: CRP: 11.4 mg/dL — ABNORMAL HIGH (ref <1.0)

## 2020-04-18 MED ORDER — ENOXAPARIN SODIUM 40 MG/0.4ML ~~LOC~~ SOLN
40.0000 mg | SUBCUTANEOUS | Status: DC
  Administered 2020-04-18 – 2020-04-20 (×3): 40 mg via SUBCUTANEOUS
  Filled 2020-04-18 (×3): qty 0.4

## 2020-04-18 MED ORDER — TEMAZEPAM 15 MG PO CAPS
30.0000 mg | ORAL_CAPSULE | Freq: Once | ORAL | Status: AC
  Administered 2020-04-18: 30 mg via ORAL
  Filled 2020-04-18: qty 2

## 2020-04-18 NOTE — Progress Notes (Signed)
PROGRESS NOTE    Richard Webb  ZHG:[id number] DOB: 1946-05-11 DOA: 04/17/2020 PCP: [name], MD   Brief Narrative:Richard Webb is a 74 y.o. male with medical history significant of Coronary artery disease, hyperlipidemia, hypertension, asthma, sarcoidosis, anxiety, sleep apnea, ulcerative colitis who presented to the ER with abdominal pain.  Patient has been having lower abdominal pain starting over a week ago.  Also reported having a fever at home.  He had some associated nausea as well as intermittent diarrhea. He reports having worsening abdominal pain over the last 1 week as well as increased abdominal distention.  No fever, chills.  He did notice mild hematuria last week and was seen by his urologist and thought to have urinary tract infection placed on IV antibiotics.  A CT of the abdomen was done as an outpatient on 04/09/2020 which showed a possible pericolonic abscess from diverticulitis.  Patient was subsequently seen by his gastroenterologist and was asked to be seen by a surgeon for the findings of the abscess on outpatient CT.  He never ended up getting an appointment with a surgeon but decided to come to the ER for worsening abdominal pain. ED Course:  Vital Signs reviewed on presentation, significant for temperature 99.1, heart rate 83, blood pressure 122/58, saturation 93% on room air. Labs reviewed, significant for sodium 136, potassium 3.0, BUN 10, creatinine 0.9, LFTs within normal limits, WBC count 12.3, hemoglobin 13.5, hematocrit 42, platelets 252.  Urinalysis is negative.  Blood cultures have been sent. Imaging personally Reviewed, CT of the abdomen pelvis with and without contrast on 04/09/2020 showed pericolonic abscess likely related to diverticulitis between the sigmoid and the urinary bladder with thickening of the wall of the bladder likely secondary to cystitis.  No current signs of colovesicular fistula.  Small gas containing tract extending superiorly from the  sigmoid slightly downstream from this area.  Signs of cirrhosis and portal hypertension.  Assessment & Plan:   Principal Problem:   Diverticulitis of large intestine with abscess Active Problems:   Mixed hyperlipidemia   Benign prostatic hyperplasia   Acute diverticulitis  #1  Sigmoid diverticulitis with pericolonic abscess-seen by interventional radiology not amenable to drainage too small to drain.  Seen by surgery no inpatient surgery planned as of now.  Patient improving with n.p.o. IV fluids and antibiotics remains afebrile white count improving.  White count 10.1. Colonoscopy reports noted.?  IBD?  Has history of ulcerative colitis.  #2 history of CAD and stent aspirin  #3 history of essential hypertension on amlodipine metoprolol and Imdur blood pressure 129/65.  DC HCTZ losartan on hold  #4 history of ulcerative colitis on mesalamine  #5 history of obstructive sleep apnea continue CPAP at night  #6 history of GERD change Protonix to p.o.  #7 history of sarcoidosis/asthma-continue Singulair and albuterol Breo Ellipta.  #8?  Cirrhosis by CT appearance no history of alcohol abuse hepatitis.  LFTs within normal limits.  Possibly fatty liver.  #9 Parkinson's disease on Sinemet     Estimated body mass index is 29.84 kg/m as calculated from the following:   Height as of this encounter: 6' (1.829 m).   Weight as of this encounter: 99.8 kg.  DVT prophylaxis: Lovenox  code Status: Full code Family Communication: None at bedside Disposition Plan:  Status is: Inpatient  Dispo: The patient is from: Home              Anticipated d/c is to: Home  Anticipated d/c date is: 2 days              Patient currently is not medically stable to d/c.    Consultants: General surgery  Procedures: None Antimicrobials: Zosyn Subjective: Sitting up in chair feeling better pain is better denies any frequency or dysuria having flatus bowel movements  Objective: Vitals:    04/17/20 1824 04/17/20 2203 04/18/20 0248 04/18/20 0626  BP: 136/75 120/73 (!) 108/53 129/65  Pulse: 81 75 81 73  Resp: 16  17 18   Temp: 98 F (36.7 C) 98 F (36.7 C) 99 F (37.2 C) 98.5 F (36.9 C)  TempSrc: Oral     SpO2: 98% 93% (!) 79% 96%  Weight:      Height:        Intake/Output Summary (Last 24 hours) at 04/18/2020 0959 Last data filed at 04/18/2020 1287 Gross per 24 hour  Intake 1610 ml  Output 1250 ml  Net 360 ml   Filed Weights   04/16/20 1957  Weight: 99.8 kg    Examination:  General exam: Appears calm and comfortable  Respiratory system: Clear to auscultation. Respiratory effort normal. Cardiovascular system: S1 & S2 heard, RRR. No JVD, murmurs, rubs, gallops or clicks. No pedal edema. Gastrointestinal system: Abdomen is nondistended, soft and nontender. No organomegaly or masses felt. Normal bowel sounds heard. Central nervous system: Alert and oriented. No focal neurological deficits. Extremities: Symmetric 5 x 5 power. Skin: No rashes, lesions or ulcers Psychiatry: Judgement and insight appear normal. Mood & affect appropriate.     Data Reviewed: I have personally reviewed following labs and imaging studies  CBC: Recent Labs  Lab 04/16/20 2026 04/17/20 0101 04/18/20 0256  WBC 13.0* 12.3* 10.1  NEUTROABS  --  9.6*  --   HGB 13.8 13.5 10.9*  HCT 43.3 42.3 34.7*  MCV 87.1 86.9 85.9  PLT 308 252 867   Basic Metabolic Panel: Recent Labs  Lab 04/16/20 2026 04/18/20 0256  NA 136 135  K 3.0* 3.6  CL 97* 101  CO2 26 27  GLUCOSE 109* 98  BUN 10 7*  CREATININE 0.99 1.01  CALCIUM 9.3 8.8*   GFR: Estimated Creatinine Clearance: 78.5 mL/min (by C-G formula based on SCr of 1.01 mg/dL). Liver Function Tests: Recent Labs  Lab 04/16/20 2026  AST 20  ALT 18  ALKPHOS 79  BILITOT 2.1*  PROT 8.2*  ALBUMIN 4.4   Recent Labs  Lab 04/16/20 2026  LIPASE 31   No results for input(s): AMMONIA in the last 168 hours. Coagulation Profile: No  results for input(s): INR, PROTIME in the last 168 hours. Cardiac Enzymes: No results for input(s): CKTOTAL, CKMB, CKMBINDEX, TROPONINI in the last 168 hours. BNP (last 3 results) No results for input(s): PROBNP in the last 8760 hours. HbA1C: No results for input(s): HGBA1C in the last 72 hours. CBG: No results for input(s): GLUCAP in the last 168 hours. Lipid Profile: No results for input(s): CHOL, HDL, LDLCALC, TRIG, CHOLHDL, LDLDIRECT in the last 72 hours. Thyroid Function Tests: No results for input(s): TSH, T4TOTAL, FREET4, T3FREE, THYROIDAB in the last 72 hours. Anemia Panel: No results for input(s): VITAMINB12, FOLATE, FERRITIN, TIBC, IRON, RETICCTPCT in the last 72 hours. Sepsis Labs: Recent Labs  Lab 04/17/20 0101  LATICACIDVEN 1.5    Recent Results (from the past 240 hour(s))  Blood culture (routine x 2)     Status: None (Preliminary result)   Collection Time: 04/17/20  1:01 AM   Specimen: BLOOD  Result Value Ref Range Status   Specimen Description BLOOD RIGHT ANTECUBITAL  Final   Special Requests   Final    BOTTLES DRAWN AEROBIC AND ANAEROBIC Blood Culture adequate volume   Culture   Final    NO GROWTH 1 DAY Performed at [facility], [address] [place], [place] [address]    Report Status PENDING  Incomplete  Blood culture (routine x 2)     Status: None (Preliminary result)   Collection Time: 04/17/20  1:55 AM   Specimen: BLOOD  Result Value Ref Range Status   Specimen Description BLOOD LEFT ANTECUBITAL  Final   Special Requests   Final    BOTTLES DRAWN AEROBIC AND ANAEROBIC Blood Culture adequate volume   Culture   Final    NO GROWTH 1 DAY Performed at [facility], [address], [place], [place] [address]    Report Status PENDING  Incomplete  SARS Coronavirus 2 by RT PCR (hospital order, performed in [place] hospital lab) Nasopharyngeal Nasopharyngeal Swab     Status: None   Collection Time: 04/17/20  2:41 AM   Specimen: Nasopharyngeal Swab    Result Value Ref Range Status   SARS Coronavirus 2 NEGATIVE NEGATIVE Final    Comment: (NOTE) SARS-CoV-2 target nucleic acids are NOT DETECTED.  The SARS-CoV-2 RNA is generally detectable in upper and lower respiratory specimens during the acute phase of infection. The lowest concentration of SARS-CoV-2 viral copies this assay can detect is 250 copies / mL. A negative result does not preclude SARS-CoV-2 infection and should not be used as the sole basis for treatment or other patient management decisions.  A negative result may occur with improper specimen collection / handling, submission of specimen other than nasopharyngeal swab, presence of viral mutation(s) within the areas targeted by this assay, and inadequate number of viral copies (<250 copies / mL). A negative result must be combined with clinical observations, patient history, and epidemiological information.  Fact Sheet for Patients:   StrictlyIdeas.no  Fact Sheet for Healthcare Providers: BankingDealers.co.za  This test is not yet approved or  cleared by the [place] FDA and has been authorized for detection and/or diagnosis of SARS-CoV-2 by FDA under an Emergency Use Authorization (EUA).  This EUA will remain in effect (meaning this test can be used) for the duration of the COVID-19 declaration under Section 564(b)(1) of the Act, 21 U.S.C. section 360bbb-3(b)(1), unless the authorization is terminated or revoked sooner.  Performed at [facility], [address], [place], [place] [address]          Radiology Studies: CT ABDOMEN PELVIS W CONTRAST  Result Date: 04/17/2020 CLINICAL DATA:  Evaluate for abdominal abscess/infection. EXAM: CT ABDOMEN AND PELVIS WITH CONTRAST TECHNIQUE: Multidetector CT imaging of the abdomen and pelvis was performed using the standard protocol following bolus administration of intravenous contrast. CONTRAST:  122m OMNIPAQUE IOHEXOL  300 MG/ML  SOLN COMPARISON:  04/09/2020 FINDINGS: Lower chest: No acute abnormality identified. Chronic interstitial thickening and reticulation is noted within both lung bases which is progressive when compared with 05/29/2016. Left lower lobe lung nodule is again noted measuring 0.7 cm. Unchanged from 04/09/2020. This was also likely present on more remote exam from 05/29/2016. Hepatobiliary: Nodular appearance of the liver concerning for cirrhosis. There is no focal liver abnormality identified. Previous cholecystectomy. No bile duct dilatation. Pancreas: Unremarkable. No pancreatic ductal dilatation or surrounding inflammatory changes. Spleen: Normal in size without focal abnormality. Adrenals/Urinary Tract: Normal appearance of the adrenal glands. Multiple bilateral  kidney cysts are identified the largest arises from the lateral right mid kidney measuring 4.7 cm. No hydronephrosis identified bilaterally. Again seen is abnormal wall thickening involving the dome of bladder with adjacent diverticular inflammation. Stomach/Bowel: Small hiatal hernia. Stomach is otherwise unremarkable. Small bowel loops are nondilated without wall thickening or inflammation. Proximal colon is unremarkable. Wall thickening and inflammation associated with the mid sigmoid colon is again noted compatible with acute diverticulitis. Along the superior margin of the sigmoid colon, in the area of the previously characterized diverticulum, there has been progressive inflammation with increased soft tissue stranding. This area now contains fluid and gas with small tracks communicating with 2 separate segments of sigmoid colon likely reflecting small abscess with fistulization. On today's study this measures 2.3 by 2.2 cm, image 50/5. 1.7 x 1.6 cm. Along the inferior margin of the same segment of sigmoid colon is an area of extra colonic gas and fluid which is contiguous with the dome of bladder. No gas seen within the lumen of the bladder.  Vascular/Lymphatic: Aortic atherosclerosis without aneurysm. There is no significant adenopathy within the scratch no adenopathy identified within the abdomen or pelvis. Reproductive: Seed implants noted within the prostate gland. Other: No significant free fluid. No pneumoperitoneum. Left inguinal hernia contains fat only. Musculoskeletal: Lumbar degenerative disc disease. No acute or suspicious osseous findings. IMPRESSION: 1. Since 04/09/2020 there has been progressive inflammation and soft tissue stranding associated with the previously characterized sigmoid diverticulitis. 2. Similar appearance of previously characterized pericolonic abscess along the inferior margin of the sigmoid colon which extends to the dome of bladder resulting in focal bladder wall thickening, likely secondary cystitis. No gas identified within the bladder at this time to suggest patent fistula. 3. A second pericolonic abscess is noted along the superior margin of the sigmoid colon in the area of previously characterized large sigmoid diverticula. This abscess is small and not likely amenable to percutaneous drainage at this time. 4. 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 5. Aortic atherosclerosis. Aortic Atherosclerosis (ICD10-I70.0). Electronically Signed   By: Kerby Moors M.D.   On: 04/17/2020 06:05        Scheduled Meds: . amLODipine  10 mg Oral Daily  . fluticasone furoate-vilanterol  1 puff Inhalation Daily  . hydrochlorothiazide  25 mg Oral Daily  . isosorbide mononitrate  30 mg Oral Daily  . metoprolol tartrate  12.5 mg Oral BID  . montelukast  10 mg Oral QHS  . sodium chloride flush  3 mL Intravenous Q12H   Continuous Infusions: . sodium chloride    . lactated ringers 125 mL/hr at 04/17/20 1806  . piperacillin-tazobactam 3.375 g (04/17/20 2310)     LOS: 1 day     Georgette Shell, MD 04/18/2020, 9:59 AM

## 2020-04-18 NOTE — Plan of Care (Signed)

## 2020-04-18 NOTE — Progress Notes (Addendum)
Subjective: CC: Patient doing well. Pain has improved and gone from more generalized to epigastric/suprapubic. No n/v. Passing flatus. Loose BM this AM.   Objective: Vital signs in last 24 hours: Temp:  [98 F (36.7 C)-99 F (37.2 C)] 98.5 F (36.9 C) (08/29 0626) Pulse Rate:  [71-81] 73 (08/29 0626) Resp:  [16-18] 18 (08/29 0626) BP: (108-136)/(53-76) 129/65 (08/29 0626) SpO2:  [79 %-98 %] 96 % (08/29 0626) Last BM Date: 04/16/20  Intake/Output from previous day: 08/28 0701 - 08/29 0700 In: 1710 [P.O.:60; I.V.:1500; IV Piggyback:150] Out: 1250 [Urine:1250] Intake/Output this shift: No intake/output data recorded.  PE: Gen:  Alert, NAD, pleasant Pulm: Normal rate and effort  Abd: Soft, mild distension, RUQ and epigastric tenderness greater than left sided tenderness without peritonitis, +BS Ext:  SCDs in place Psych: A&Ox3  Skin: no rashes noted, warm and dry   Lab Results:  Recent Labs    04/17/20 0101 04/18/20 0256  WBC 12.3* 10.1  HGB 13.5 10.9*  HCT 42.3 34.7*  PLT 252 254   BMET Recent Labs    04/16/20 2026 04/18/20 0256  NA 136 135  K 3.0* 3.6  CL 97* 101  CO2 26 27  GLUCOSE 109* 98  BUN 10 7*  CREATININE 0.99 1.01  CALCIUM 9.3 8.8*   PT/INR No results for input(s): LABPROT, INR in the last 72 hours. CMP     Component Value Date/Time   NA 135 04/18/2020 0256   NA 136 03/18/2020 1129   K 3.6 04/18/2020 0256   CL 101 04/18/2020 0256   CO2 27 04/18/2020 0256   GLUCOSE 98 04/18/2020 0256   BUN 7 (L) 04/18/2020 0256   BUN 21 03/18/2020 1129   CREATININE 1.01 04/18/2020 0256   CREATININE 0.86 10/18/2017 1648   CALCIUM 8.8 (L) 04/18/2020 0256   PROT 8.2 (H) 04/16/2020 2026   ALBUMIN 4.4 04/16/2020 2026   AST 20 04/16/2020 2026   ALT 18 04/16/2020 2026   ALKPHOS 79 04/16/2020 2026   BILITOT 2.1 (H) 04/16/2020 2026   GFRNONAA >60 04/18/2020 0256   GFRAA >60 04/18/2020 0256   Lipase     Component Value Date/Time   LIPASE 31  04/16/2020 2026       Studies/Results: CT ABDOMEN PELVIS W CONTRAST  Result Date: 04/17/2020 CLINICAL DATA:  Evaluate for abdominal abscess/infection. EXAM: CT ABDOMEN AND PELVIS WITH CONTRAST TECHNIQUE: Multidetector CT imaging of the abdomen and pelvis was performed using the standard protocol following bolus administration of intravenous contrast. CONTRAST:  155m OMNIPAQUE IOHEXOL 300 MG/ML  SOLN COMPARISON:  04/09/2020 FINDINGS: Lower chest: No acute abnormality identified. Chronic interstitial thickening and reticulation is noted within both lung bases which is progressive when compared with 05/29/2016. Left lower lobe lung nodule is again noted measuring 0.7 cm. Unchanged from 04/09/2020. This was also likely present on more remote exam from 05/29/2016. Hepatobiliary: Nodular appearance of the liver concerning for cirrhosis. There is no focal liver abnormality identified. Previous cholecystectomy. No bile duct dilatation. Pancreas: Unremarkable. No pancreatic ductal dilatation or surrounding inflammatory changes. Spleen: Normal in size without focal abnormality. Adrenals/Urinary Tract: Normal appearance of the adrenal glands. Multiple bilateral kidney cysts are identified the largest arises from the lateral right mid kidney measuring 4.7 cm. No hydronephrosis identified bilaterally. Again seen is abnormal wall thickening involving the dome of bladder with adjacent diverticular inflammation. Stomach/Bowel: Small hiatal hernia. Stomach is otherwise unremarkable. Small bowel loops are nondilated without wall thickening or inflammation. Proximal  colon is unremarkable. Wall thickening and inflammation associated with the mid sigmoid colon is again noted compatible with acute diverticulitis. Along the superior margin of the sigmoid colon, in the area of the previously characterized diverticulum, there has been progressive inflammation with increased soft tissue stranding. This area now contains fluid and  gas with small tracks communicating with 2 separate segments of sigmoid colon likely reflecting small abscess with fistulization. On today's study this measures 2.3 by 2.2 cm, image 50/5. 1.7 x 1.6 cm. Along the inferior margin of the same segment of sigmoid colon is an area of extra colonic gas and fluid which is contiguous with the dome of bladder. No gas seen within the lumen of the bladder. Vascular/Lymphatic: Aortic atherosclerosis without aneurysm. There is no significant adenopathy within the scratch no adenopathy identified within the abdomen or pelvis. Reproductive: Seed implants noted within the prostate gland. Other: No significant free fluid. No pneumoperitoneum. Left inguinal hernia contains fat only. Musculoskeletal: Lumbar degenerative disc disease. No acute or suspicious osseous findings. IMPRESSION: 1. Since 04/09/2020 there has been progressive inflammation and soft tissue stranding associated with the previously characterized sigmoid diverticulitis. 2. Similar appearance of previously characterized pericolonic abscess along the inferior margin of the sigmoid colon which extends to the dome of bladder resulting in focal bladder wall thickening, likely secondary cystitis. No gas identified within the bladder at this time to suggest patent fistula. 3. A second pericolonic abscess is noted along the superior margin of the sigmoid colon in the area of previously characterized large sigmoid diverticula. This abscess is small and not likely amenable to percutaneous drainage at this time. 4. 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 5. Aortic atherosclerosis. Aortic Atherosclerosis (ICD10-I70.0). Electronically Signed   By: Kerby Moors M.D.   On: 04/17/2020 06:05    Anti-infectives: Anti-infectives (From admission, onward)   Start     Dose/Rate Route Frequency Ordered Stop   04/17/20 0800  piperacillin-tazobactam (ZOSYN) IVPB 3.375 g        3.375 g 12.5 mL/hr over 240 Minutes  Intravenous Every 8 hours 04/17/20 0519     04/17/20 0100  piperacillin-tazobactam (ZOSYN) IVPB 3.375 g        3.375 g 100 mL/hr over 30 Minutes Intravenous  Once 04/17/20 0049 04/17/20 0141             Assessment/Plan Complicated Diverticulitis  - Intra-abdominal abscesses are not IR amenable to drainage - Cont abx. WBC has normalized and pain has improved.  - Allow sips of clears.  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 No activityis identified.There are focal areas of reactive atypia, however we do not see evidence of any dysplasia or malignancy. Please see notes from colonoscopy 02/06/2020 as above (I have ask for them to get scanned into the chart). Will discuss with MD to see if GI needs to be involved - Hopefully will continue to improve conservatively and be able to avoid surgery as an inpatient.  - We will continue to follow  FEN - Sips and chips VTE - SCds, okay for chemical prophylaxis from our standpoint ID - Zosyn    LOS: 1 day    Jillyn Ledger , [facility] Surgery 04/18/2020, 9:24 AM Please see Amion for pager number during day hours 7:00am-4:30pm

## 2020-04-19 ENCOUNTER — Inpatient Hospital Stay (HOSPITAL_COMMUNITY): Payer: Medicare Other

## 2020-04-19 ENCOUNTER — Telehealth: Payer: Self-pay | Admitting: Urology

## 2020-04-19 DIAGNOSIS — R609 Edema, unspecified: Secondary | ICD-10-CM

## 2020-04-19 MED ORDER — IBUPROFEN 400 MG PO TABS
400.0000 mg | ORAL_TABLET | Freq: Three times a day (TID) | ORAL | Status: DC
  Administered 2020-04-19 – 2020-04-20 (×3): 400 mg via ORAL
  Filled 2020-04-19 (×3): qty 1

## 2020-04-19 MED ORDER — METOPROLOL TARTRATE 12.5 MG HALF TABLET
12.5000 mg | ORAL_TABLET | Freq: Every day | ORAL | Status: DC
  Administered 2020-04-20: 12.5 mg via ORAL
  Filled 2020-04-19: qty 1

## 2020-04-19 MED ORDER — OXYCODONE HCL 5 MG PO TABS
5.0000 mg | ORAL_TABLET | ORAL | Status: DC | PRN
  Administered 2020-04-19: 10 mg via ORAL
  Filled 2020-04-19: qty 2

## 2020-04-19 MED ORDER — ACETAMINOPHEN 325 MG PO TABS: 650.0000 mg | ORAL_TABLET | ORAL | Status: DC | PRN

## 2020-04-19 MED ORDER — PANTOPRAZOLE SODIUM 40 MG PO TBEC
40.0000 mg | DELAYED_RELEASE_TABLET | Freq: Every day | ORAL | Status: DC
  Administered 2020-04-19 – 2020-04-20 (×2): 40 mg via ORAL
  Filled 2020-04-19 (×2): qty 1

## 2020-04-19 MED ORDER — AMLODIPINE BESYLATE 2.5 MG PO TABS
2.5000 mg | ORAL_TABLET | Freq: Every day | ORAL | Status: DC
  Administered 2020-04-20: 2.5 mg via ORAL
  Filled 2020-04-19: qty 1

## 2020-04-19 MED ORDER — TEMAZEPAM 15 MG PO CAPS
30.0000 mg | ORAL_CAPSULE | Freq: Once | ORAL | Status: AC
  Administered 2020-04-19: 30 mg via ORAL
  Filled 2020-04-19: qty 2

## 2020-04-19 NOTE — Progress Notes (Addendum)
Subjective: CC: Patient doing well. Having less abdominal pain. Mainly suprapubic. Tolerating clears without increased abdominal pain, n/v. Passing flatus. 2 bm's yesterday that were formed. Mainly complains of back pain and left foot pain.   Notes hx of UC that he was previously on sulfadiazine after colonoscopy in 2013. He is followed by GI, Dr. Elvis Coil, in [place]. Notes he has been off medications for the last 1 year   Objective: Vital signs in last 24 hours: Temp:  [98 F (36.7 C)-98.5 F (36.9 C)] 98.1 F (36.7 C) (08/30 0422) Pulse Rate:  [73-76] 75 (08/30 0422) Resp:  [17] 17 (08/30 0422) BP: (113-116)/(60-64) 115/62 (08/30 0422) SpO2:  [95 %-96 %] 96 % (08/30 0422) Last BM Date: 04/18/20  Intake/Output from previous day: 08/29 0701 - 08/30 0700 In: 1500 [I.V.:1500] Out: 1100 [Urine:1100] Intake/Output this shift: Total I/O In: -  Out: 800 [Urine:800]  PE: Gen:  Alert, NAD, pleasant Pulm: Normal rate and effort  Abd: Soft, mild distension, mild periumbilical and suprapubic ttp that is much improved from yesterday and without peritonitis, +BS Ext:  No calf pain or LE edema  Psych: A&Ox3  Skin: no rashes noted, warm and dry  Lab Results:  Recent Labs    04/17/20 0101 04/18/20 0256  WBC 12.3* 10.1  HGB 13.5 10.9*  HCT 42.3 34.7*  PLT 252 254   BMET Recent Labs    04/16/20 2026 04/18/20 0256  NA 136 135  K 3.0* 3.6  CL 97* 101  CO2 26 27  GLUCOSE 109* 98  BUN 10 7*  CREATININE 0.99 1.01  CALCIUM 9.3 8.8*   PT/INR No results for input(s): LABPROT, INR in the last 72 hours. CMP     Component Value Date/Time   NA 135 04/18/2020 0256   NA 136 03/18/2020 1129   K 3.6 04/18/2020 0256   CL 101 04/18/2020 0256   CO2 27 04/18/2020 0256   GLUCOSE 98 04/18/2020 0256   BUN 7 (L) 04/18/2020 0256   BUN 21 03/18/2020 1129   CREATININE 1.01 04/18/2020 0256   CREATININE 0.86 10/18/2017 1648   CALCIUM 8.8 (L) 04/18/2020 0256   PROT  8.2 (H) 04/16/2020 2026   ALBUMIN 4.4 04/16/2020 2026   AST 20 04/16/2020 2026   ALT 18 04/16/2020 2026   ALKPHOS 79 04/16/2020 2026   BILITOT 2.1 (H) 04/16/2020 2026   GFRNONAA >60 04/18/2020 0256   GFRAA >60 04/18/2020 0256   Lipase     Component Value Date/Time   LIPASE 31 04/16/2020 2026       Studies/Results: No results found.  Anti-infectives: Anti-infectives (From admission, onward)   Start     Dose/Rate Route Frequency Ordered Stop   04/17/20 0800  piperacillin-tazobactam (ZOSYN) IVPB 3.375 g        3.375 g 12.5 mL/hr over 240 Minutes Intravenous Every 8 hours 04/17/20 0519     04/17/20 0100  piperacillin-tazobactam (ZOSYN) IVPB 3.375 g        3.375 g 100 mL/hr over 30 Minutes Intravenous  Once 04/17/20 0049 04/17/20 0141       Assessment/Plan Complicated Diverticulitis  - Intra-abdominal abscesses are not IR amenable to drainage - Cont abx. WBC has normalized and pain has improved.  - Adv to FLD - Hopefully will continue to improve conservatively and be able to avoid surgery as an inpatient.  - Patient notes hx of UC that he was previously on sulfadiazine after colonoscopy in 2013. He  is followed by GI, Dr. Elvis Coil, in [place]. Notes he has been off medications for the last 1 year. 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 He also had a colonoscopy 02/06/2020 (see media section).  This showed an ulcer in the proximal descending colon, interestingly no diverticuli. I will review with GI.  - We will continue to follow  FEN - FLD VTE - SCDs, Lovenox  ID - Zosyn 8/28 >>    LOS: 2 days    Jillyn Ledger , [facility] 04/19/2020, 8:58 AM Please see Amion for pager number during day hours 7:00am-4:30pm

## 2020-04-19 NOTE — Plan of Care (Signed)

## 2020-04-19 NOTE — Progress Notes (Signed)
Lower extremity venous has been completed.   Preliminary results in CV Proc.   Abram Sander 04/19/2020 11:31 AM

## 2020-04-19 NOTE — Progress Notes (Signed)
PROGRESS NOTE    Richard Webb  POE:[id number] DOB: 01/09/1946 DOA: 04/17/2020 PCP: [name], MD   Brief Narrative:Richard Webb is a 74 y.o. male with medical history significant of Coronary artery disease, hyperlipidemia, hypertension, asthma, sarcoidosis, anxiety, sleep apnea, ulcerative colitis who presented to the ER with abdominal pain.  Patient has been having lower abdominal pain starting over a week ago.  Also reported having a fever at home.  He had some associated nausea as well as intermittent diarrhea. He reports having worsening abdominal pain over the last 1 week as well as increased abdominal distention.  No fever, chills.  He did notice mild hematuria last week and was seen by his urologist and thought to have urinary tract infection placed on IV antibiotics.  A CT of the abdomen was done as an outpatient on 04/09/2020 which showed a possible pericolonic abscess from diverticulitis.  Patient was subsequently seen by his gastroenterologist and was asked to be seen by a surgeon for the findings of the abscess on outpatient CT.  He never ended up getting an appointment with a surgeon but decided to come to the ER for worsening abdominal pain. ED Course:  Vital Signs reviewed on presentation, significant for temperature 99.1, heart rate 83, blood pressure 122/58, saturation 93% on room air. Labs reviewed, significant for sodium 136, potassium 3.0, BUN 10, creatinine 0.9, LFTs within normal limits, WBC count 12.3, hemoglobin 13.5, hematocrit 42, platelets 252.  Urinalysis is negative.  Blood cultures have been sent. Imaging personally Reviewed, CT of the abdomen pelvis with and without contrast on 04/09/2020 showed pericolonic abscess likely related to diverticulitis between the sigmoid and the urinary bladder with thickening of the wall of the bladder likely secondary to cystitis.  No current signs of colovesicular fistula.  Small gas containing tract extending superiorly from the  sigmoid slightly downstream from this area.  Signs of cirrhosis and portal hypertension.  Assessment & Plan:   Principal Problem:   Diverticulitis of large intestine with abscess Active Problems:   Mixed hyperlipidemia   Benign prostatic hyperplasia   Acute diverticulitis  #1  Sigmoid diverticulitis with pericolonic abscess-seen by interventional radiology not amenable to drainage too small to drain.  Seen by surgery no inpatient surgery planned as of now.  He is improving has been on ice chips and sips with improvement.  Surgery planning on increasing his diet to full liquid diet today.  He is afebrile with normal white count.  Colonoscopy 02/08/2020 with ulcer in the proximal descending colon.  #2 history of CAD and stent aspirin  #3 history of essential hypertension -115/62 decrease the dose of Norvasc and metoprolol.  Continue Imdur 30 mg daily.  He is also on HCTZ and losartan at home which is on hold.   #4 history of ulcerative colitis on mesalamine  #5 history of obstructive sleep apnea continue CPAP at night  #6 history of GERD on omeprazole 40 mg daily.  #7 history of sarcoidosis/asthma-continue Singulair and albuterol Breo Ellipta.  #8?  Cirrhosis by CT appearance no history of alcohol abuse hepatitis.  LFTs within normal limits.  Possibly fatty liver.  #9 Parkinson's disease on Sinemet  #10 left lower extremity pain he has 1+ lower extremity edema with decreased range of motion to the ankle check vascular Doppler to the left lower extremity and x-ray of the ankle  Estimated body mass index is 29.84 kg/m as calculated from the following:   Height as of this encounter: 6' (1.829 m).   Weight  as of this encounter: 99.8 kg.  DVT prophylaxis: Lovenox  code Status: Full code Family Communication: None at bedside Disposition Plan:  Status is: Inpatient  Dispo: The patient is from: Home              Anticipated d/c is to: Home              Anticipated d/c date is: 2  days              Patient currently is not medically stable to d/c.    Consultants: General surgery  Procedures: None Antimicrobials: Zosyn Subjective: He reports back pain which is chronic and new left lower extremity pain not able to walk much or bear weight on that left foot no falls no sprains He denies abdominal pain today no nausea vomiting having flatus. Objective: Vitals:   04/18/20 1340 04/18/20 2020 04/19/20 0212 04/19/20 0422  BP: 113/60 113/62 116/64 115/62  Pulse: 73 75 76 75  Resp: 17 17 17 17   Temp: 98.5 F (36.9 C) 98.2 F (36.8 C) 98 F (36.7 C) 98.1 F (36.7 C)  TempSrc: Skin Oral Oral Oral  SpO2: 95% 96% 96% 96%  Weight:      Height:        Intake/Output Summary (Last 24 hours) at 04/19/2020 0950 Last data filed at 04/19/2020 0723 Gross per 24 hour  Intake 1500 ml  Output 1900 ml  Net -400 ml   Filed Weights   04/16/20 1957  Weight: 99.8 kg    Examination:  General exam: Appears calm and comfortable  Respiratory system: Clear to auscultation. Respiratory effort normal. Cardiovascular system: S1 & S2 heard, RRR. No JVD, murmurs, rubs, gallops or clicks. No pedal edema. Gastrointestinal system: Abdomen is nondistended, soft and nontender. No organomegaly or masses felt. Normal bowel sounds heard. Central nervous system: Alert and oriented. No focal neurological deficits. Extremities: Left lower extremity edema decreased range of motion to the left ankle Skin: No rashes, lesions or ulcers Psychiatry: Judgement and insight appear normal. Mood & affect appropriate.     Data Reviewed: I have personally reviewed following labs and imaging studies  CBC: Recent Labs  Lab 04/16/20 2026 04/17/20 0101 04/18/20 0256  WBC 13.0* 12.3* 10.1  NEUTROABS  --  9.6*  --   HGB 13.8 13.5 10.9*  HCT 43.3 42.3 34.7*  MCV 87.1 86.9 85.9  PLT 308 252 716   Basic Metabolic Panel: Recent Labs  Lab 04/16/20 2026 04/18/20 0256  NA 136 135  K 3.0* 3.6  CL  97* 101  CO2 26 27  GLUCOSE 109* 98  BUN 10 7*  CREATININE 0.99 1.01  CALCIUM 9.3 8.8*   GFR: Estimated Creatinine Clearance: 78.5 mL/min (by C-G formula based on SCr of 1.01 mg/dL). Liver Function Tests: Recent Labs  Lab 04/16/20 2026  AST 20  ALT 18  ALKPHOS 79  BILITOT 2.1*  PROT 8.2*  ALBUMIN 4.4   Recent Labs  Lab 04/16/20 2026  LIPASE 31   No results for input(s): AMMONIA in the last 168 hours. Coagulation Profile: No results for input(s): INR, PROTIME in the last 168 hours. Cardiac Enzymes: No results for input(s): CKTOTAL, CKMB, CKMBINDEX, TROPONINI in the last 168 hours. BNP (last 3 results) No results for input(s): PROBNP in the last 8760 hours. HbA1C: No results for input(s): HGBA1C in the last 72 hours. CBG: No results for input(s): GLUCAP in the last 168 hours. Lipid Profile: No results for input(s): CHOL, HDL, LDLCALC,  TRIG, CHOLHDL, LDLDIRECT in the last 72 hours. Thyroid Function Tests: No results for input(s): TSH, T4TOTAL, FREET4, T3FREE, THYROIDAB in the last 72 hours. Anemia Panel: No results for input(s): VITAMINB12, FOLATE, FERRITIN, TIBC, IRON, RETICCTPCT in the last 72 hours. Sepsis Labs: Recent Labs  Lab 04/17/20 0101  LATICACIDVEN 1.5    Recent Results (from the past 240 hour(s))  Blood culture (routine x 2)     Status: None (Preliminary result)   Collection Time: 04/17/20  1:01 AM   Specimen: BLOOD  Result Value Ref Range Status   Specimen Description BLOOD RIGHT ANTECUBITAL  Final   Special Requests   Final    BOTTLES DRAWN AEROBIC AND ANAEROBIC Blood Culture adequate volume   Culture   Final    NO GROWTH 2 DAYS Performed at [facility], [address], [place], [place] [address]    Report Status PENDING  Incomplete  Blood culture (routine x 2)     Status: None (Preliminary result)   Collection Time: 04/17/20  1:55 AM   Specimen: BLOOD  Result Value Ref Range Status   Specimen Description BLOOD LEFT ANTECUBITAL  Final    Special Requests   Final    BOTTLES DRAWN AEROBIC AND ANAEROBIC Blood Culture adequate volume   Culture   Final    NO GROWTH 2 DAYS Performed at [facility], [address] [place], [place] [address]    Report Status PENDING  Incomplete  SARS Coronavirus 2 by RT PCR (hospital order, performed in [place] hospital lab) Nasopharyngeal Nasopharyngeal Swab     Status: None   Collection Time: 04/17/20  2:41 AM   Specimen: Nasopharyngeal Swab  Result Value Ref Range Status   SARS Coronavirus 2 NEGATIVE NEGATIVE Final    Comment: (NOTE) SARS-CoV-2 target nucleic acids are NOT DETECTED.  The SARS-CoV-2 RNA is generally detectable in upper and lower respiratory specimens during the acute phase of infection. The lowest concentration of SARS-CoV-2 viral copies this assay can detect is 250 copies / mL. A negative result does not preclude SARS-CoV-2 infection and should not be used as the sole basis for treatment or other patient management decisions.  A negative result may occur with improper specimen collection / handling, submission of specimen other than nasopharyngeal swab, presence of viral mutation(s) within the areas targeted by this assay, and inadequate number of viral copies (<250 copies / mL). A negative result must be combined with clinical observations, patient history, and epidemiological information.  Fact Sheet for Patients:   StrictlyIdeas.no  Fact Sheet for Healthcare Providers: BankingDealers.co.za  This test is not yet approved or  cleared by the [place] FDA and has been authorized for detection and/or diagnosis of SARS-CoV-2 by FDA under an Emergency Use Authorization (EUA).  This EUA will remain in effect (meaning this test can be used) for the duration of the COVID-19 declaration under Section 564(b)(1) of the Act, 21 U.S.C. section 360bbb-3(b)(1), unless the authorization is terminated or revoked  sooner.  Performed at [facility], [address], [place], [place] [address]          Radiology Studies: No results found.      Scheduled Meds: . amLODipine  10 mg Oral Daily  . enoxaparin (LOVENOX) injection  40 mg Subcutaneous Q24H  . fluticasone furoate-vilanterol  1 puff Inhalation Daily  . isosorbide mononitrate  30 mg Oral Daily  . metoprolol tartrate  12.5 mg Oral BID  . montelukast  10 mg Oral QHS  . sodium chloride  flush  3 mL Intravenous Q12H   Continuous Infusions: . sodium chloride    . lactated ringers 125 mL/hr at 04/19/20 0537  . piperacillin-tazobactam 3.375 g (04/19/20 0845)     LOS: 2 days     Georgette Shell, MD 04/19/2020, 9:50 AM

## 2020-04-19 NOTE — Telephone Encounter (Signed)
Pt called to move Cysto appt. He wanted me to let you know that he's in the hospital at [facility].

## 2020-04-20 LAB — CBC
HCT: 31.6 % — ABNORMAL LOW (ref 39.0–52.0)
Hemoglobin: 10.2 g/dL — ABNORMAL LOW (ref 13.0–17.0)
MCH: 27.7 pg (ref 26.0–34.0)
MCHC: 32.3 g/dL (ref 30.0–36.0)
MCV: 85.9 fL (ref 80.0–100.0)
Platelets: 240 10*3/uL (ref 150–400)
RBC: 3.68 MIL/uL — ABNORMAL LOW (ref 4.22–5.81)
RDW: 14.9 % (ref 11.5–15.5)
WBC: 7.7 10*3/uL (ref 4.0–10.5)
nRBC: 0 % (ref 0.0–0.2)

## 2020-04-20 LAB — BASIC METABOLIC PANEL
Anion gap: 10 (ref 5–15)
BUN: <5 mg/dL — ABNORMAL LOW (ref 8–23)
CO2: 28 mmol/L (ref 22–32)
Calcium: 8.6 mg/dL — ABNORMAL LOW (ref 8.9–10.3)
Chloride: 100 mmol/L (ref 98–111)
Creatinine, Ser: 0.96 mg/dL (ref 0.61–1.24)
GFR calc Af Amer: >60 mL/min (ref >60)
GFR calc non Af Amer: >60 mL/min (ref >60)
Glucose, Bld: 111 mg/dL — ABNORMAL HIGH (ref 70–99)
Potassium: 3.2 mmol/L — ABNORMAL LOW (ref 3.5–5.1)
Sodium: 138 mmol/L (ref 135–145)

## 2020-04-20 LAB — MAGNESIUM: Magnesium: 1.8 mg/dL (ref 1.7–2.4)

## 2020-04-20 MED ORDER — ONDANSETRON HCL 4 MG PO TABS: 4.0000 mg | ORAL_TABLET | Freq: Four times a day (QID) | ORAL | 0 refills | Status: DC | PRN

## 2020-04-20 MED ORDER — POTASSIUM CHLORIDE CRYS ER 20 MEQ PO TBCR
40.0000 meq | EXTENDED_RELEASE_TABLET | Freq: Once | ORAL | Status: AC
  Administered 2020-04-20: 40 meq via ORAL
  Filled 2020-04-20: qty 2

## 2020-04-20 MED ORDER — POLYETHYLENE GLYCOL 3350 17 G PO PACK: 17.0000 g | PACK | Freq: Every day | ORAL | 0 refills | Status: DC | PRN

## 2020-04-20 MED ORDER — POTASSIUM CHLORIDE 10 MEQ/100ML IV SOLN
10.0000 meq | INTRAVENOUS | Status: AC
  Filled 2020-04-20: qty 100

## 2020-04-20 MED ORDER — AMOXICILLIN-POT CLAVULANATE 875-125 MG PO TABS: 1.0000 | ORAL_TABLET | Freq: Two times a day (BID) | ORAL | 0 refills | Status: DC

## 2020-04-20 MED ORDER — METOPROLOL TARTRATE 25 MG PO TABS
12.5000 mg | ORAL_TABLET | Freq: Every day | ORAL | 2 refills | Status: DC
Start: 2020-04-20 — End: 2020-08-09

## 2020-04-20 MED ORDER — AMLODIPINE BESYLATE 2.5 MG PO TABS
2.5000 mg | ORAL_TABLET | Freq: Every day | ORAL | 2 refills | Status: DC
Start: 2020-04-20 — End: 2020-10-17

## 2020-04-20 NOTE — Discharge Instructions (Signed)
Diverticulitis  Diverticulitis is infection or inflammation of small pouches (diverticula) in the colon that form due to a condition called diverticulosis. Diverticula can trap stool (feces) and bacteria, causing infection and inflammation. Diverticulitis may cause severe stomach pain and diarrhea. It may lead to tissue damage in the colon that causes bleeding. The diverticula may also burst (rupture) and cause infected stool to enter other areas of the abdomen. Complications of diverticulitis can include:  Bleeding.  Severe infection.  Severe pain.  Rupture (perforation) of the colon.  Blockage (obstruction) of the colon. What are the causes? This condition is caused by stool becoming trapped in the diverticula, which allows bacteria to grow in the diverticula. This leads to inflammation and infection. What increases the risk? You are more likely to develop this condition if:  You have diverticulosis. The risk for diverticulosis increases if: ? You are overweight or obese. ? You use tobacco products. ? You do not get enough exercise.  You eat a diet that does not include enough fiber. High-fiber foods include fruits, vegetables, beans, nuts, and whole grains. What are the signs or symptoms? Symptoms of this condition may include:  Pain and tenderness in the abdomen. The pain is normally located on the left side of the abdomen, but it may occur in other areas.  Fever and chills.  Bloating.  Cramping.  Nausea.  Vomiting.  Changes in bowel routines.  Blood in your stool. How is this diagnosed? This condition is diagnosed based on:  Your medical history.  A physical exam.  Tests to make sure there is nothing else causing your condition. These tests may include: ? Blood tests. ? Urine tests. ? Imaging tests of the abdomen, including X-rays, ultrasounds, MRIs, or CT scans. How is this treated? Most cases of this condition are mild and can be treated at home.  Treatment may include:  Taking over-the-counter pain medicines.  Following a clear liquid diet.  Taking antibiotic medicines by mouth.  Rest. More severe cases may need to be treated at a hospital. Treatment may include:  Not eating or drinking.  Taking prescription pain medicine.  Receiving antibiotic medicines through an IV tube.  Receiving fluids and nutrition through an IV tube.  Surgery. When your condition is under control, your health care provider may recommend that you have a colonoscopy. This is an exam to look at the entire large intestine. During the exam, a lubricated, bendable tube is inserted into the anus and then passed into the rectum, colon, and other parts of the large intestine. A colonoscopy can show how severe your diverticula are and whether something else may be causing your symptoms. Follow these instructions at home: Medicines  Take over-the-counter and prescription medicines only as told by your health care provider. These include fiber supplements, probiotics, and stool softeners.  If you were prescribed an antibiotic medicine, take it as told by your health care provider. Do not stop taking the antibiotic even if you start to feel better.  Do not drive or use heavy machinery while taking prescription pain medicine. General instructions   Follow a full liquid diet or another diet as directed by your health care provider. After your symptoms improve, your health care provider may tell you to change your diet. He or she may recommend that you eat a diet that contains at least 25 g (25 grams) of fiber daily. Fiber makes it easier to pass stool. Healthy sources of fiber include: ? Berries. One cup contains 4-8 grams of   fiber. ? Beans or lentils. One half cup contains 5-8 grams of fiber. ? Green vegetables. One cup contains 4 grams of fiber.  Exercise for at least 30 minutes, 3 times each week. You should exercise hard enough to raise your heart rate and  break a sweat.  Keep all follow-up visits as told by your health care provider. This is important. You may need a colonoscopy. Contact a health care provider if:  Your pain does not improve.  You have a hard time drinking or eating food.  Your bowel movements do not return to normal. Get help right away if:  Your pain gets worse.  Your symptoms do not get better with treatment.  Your symptoms suddenly get worse.  You have a fever.  You vomit more than one time.  You have stools that are bloody, black, or tarry. Summary  Diverticulitis is infection or inflammation of small pouches (diverticula) in the colon that form due to a condition called diverticulosis. Diverticula can trap stool (feces) and bacteria, causing infection and inflammation.  You are at higher risk for this condition if you have diverticulosis and you eat a diet that does not include enough fiber.  Most cases of this condition are mild and can be treated at home. More severe cases may need to be treated at a hospital.  When your condition is under control, your health care provider may recommend that you have an exam called a colonoscopy. This exam can show how severe your diverticula are and whether something else may be causing your symptoms. This information is not intended to replace advice given to you by your health care provider. Make sure you discuss any questions you have with your health care provider. Document Revised: 07/20/2017 Document Reviewed: 09/09/2016 Elsevier Patient Education  2020 [employer] Inc.  

## 2020-04-20 NOTE — Progress Notes (Signed)
Pt ready for DC to home accompanied by wife.  DC instructions given and reviewed, pt understands all DC instructions and follow up appointments.

## 2020-04-20 NOTE — Discharge Summary (Signed)
Physician Discharge Summary  Richard Webb AYT:[id number] DOB: 1946-03-24 DOA: 04/17/2020  PCP: [name], MD  Admit date: 04/17/2020 Discharge date: 04/20/2020  Admitted From: home Disposition:  home Recommendations for Outpatient Follow-up:  1. Follow up with PCP in 1-2 weeks 2. Please obtain BMP/CBC in one week 3. New medications started Augmentin 4. Med changes made stopped hydrochlorothiazide and Cozaar and decrease the dose of metoprolol and Norvasc due to soft blood pressure. 5. Follow-up with Dr. Claudie Leach [facility] none Equipment/Devices none  Discharge Condition: Stable CODE STATUS full code Diet recommendation: Cardiac Brief/Interim Summary:Richard Cuthbertsonis a 74 y.o.malewith medical history significant ofCoronary artery disease, hyperlipidemia,hypertension, asthma, sarcoidosis, anxiety, sleep apnea, ulcerative colitis who presented to the ER with abdominal pain. Patient has been having lower abdominal pain starting over a week ago. Also reported having a fever at home. He had some associated nausea as well as intermittent diarrhea. He reports having worsening abdominal pain over the last 1 week as well as increased abdominal distention. No fever, chills. He did notice mild hematuria last week and was seen by his urologist and thought to have urinary tract infection placed on IV antibiotics. A CT of the abdomen was done as an outpatient on 04/09/2020 which showed a possible pericolonic abscess from diverticulitis. Patient was subsequently seen by his gastroenterologist and was asked to be seen by a surgeon for the findings of the abscess on outpatient CT. He never ended up getting an appointment with a surgeon but decided to come to the ER for worsening abdominal pain. ED Course: Vital Signs reviewed on presentation, significant fortemperature 99.1, heart rate 83, blood pressure 122/58, saturation 93% on room air. Labs reviewed, significant  forsodium 136, potassium 3.0, BUN 10, creatinine 0.9, LFTs within normal limits, WBC count 12.3, hemoglobin 13.5, hematocrit 42, platelets 252. Urinalysis is negative. Blood cultures have been sent. Imaging personally Reviewed,CT of the abdomen pelvis with and without contrast on 04/09/2020 showed pericolonic abscess likely related to diverticulitis between the sigmoid and the urinary bladder with thickening of the wall of the bladder likely secondary to cystitis. No current signs of colovesicular fistula. Small gas containing tract extending superiorly from the sigmoid slightly downstream from this area. Signs of cirrhosis and portal hypertension.  Discharge Diagnoses:  Principal Problem:   Diverticulitis of large intestine with abscess Active Problems:   Mixed hyperlipidemia   Benign prostatic hyperplasia   Acute diverticulitis     #1  Sigmoid diverticulitis with pericolonic abscess-seen by interventional radiology not amenable to drainage too small to drain.  Seen by surgery conservative treatment.  Patient was treated with n.p.o. IV fluids IV Zosyn with improvement in his symptoms and leukocytosis.  He was able to tolerate a soft diet on the day of discharge.  He remained afebrile throughout the hospital stay.  He will follow-up with his GI doctor Dr. Marya Landry in [place].  Colonoscopy 02/08/2020 with ulcer in the proximal descending colon.  #2 history of CAD and stent aspirin  #3 history of essential hypertension -115/62  I have decreased the dose of Norvasc and metoprolol.  Losartan and hydrochlorothiazide has been stopped due to soft blood pressure.  Continue Imdur at 30 mg daily.    #4 history of ulcerative colitis on mesalamine  #5 history of obstructive sleep apnea continue CPAP at night  #6 history of GERD on omeprazole 40 mg daily.  #7 history of sarcoidosis/asthma-continue Singulair and albuterol Breo Ellipta.  #8?  Cirrhosis by CT appearance no history of alcohol  abuse hepatitis.  LFTs within normal limits.  Possibly fatty liver.  #9 Parkinson's disease on Sinemet  #10 left lower extremity pain he has 1+ lower extremity edema with decreased range of motion to the ankle  Venous Doppler showed no evidence of DVT. X-ray of the ankle left shows plantar calcification consistent with plantar fasciitis. I have advised him to get a splint for his left foot for plantar fasciitis.  Estimated body mass index is 29.84 kg/m as calculated from the following:   Height as of this encounter: 6' (1.829 m).   Weight as of this encounter: 99.8 kg.  Discharge Instructions  Discharge Instructions    Diet - low sodium heart healthy   Complete by: As directed    Increase activity slowly   Complete by: As directed      Allergies as of 04/20/2020      Reactions   Bee Venom Anaphylaxis   Statins    Myalgias - Simvastatin and Rosuvastatin      Medication List    STOP taking these medications   amoxicillin 500 MG tablet Commonly known as: AMOXIL   DUPIXENT [place]   losartan 50 MG tablet Commonly known as: COZAAR   Repatha SureClick 431 MG/ML [place] Generic drug: Evolocumab     TAKE these medications   amLODipine 2.5 MG tablet Commonly known as: NORVASC Take 1 tablet (2.5 mg total) by mouth daily. What changed:   medication strength  how much to take   amoxicillin-clavulanate 875-125 MG tablet Commonly known as: AUGMENTIN Take 1 tablet by mouth every 12 (twelve) hours.   aspirin EC 81 MG tablet Take 162 mg by mouth daily.   Breo Ellipta 200-25 MCG/INH Aepb Generic drug: fluticasone furoate-vilanterol Inhale 1 Inhaler into the lungs daily.   carbidopa-levodopa 10-100 MG tablet Commonly known as: SINEMET IR Take 1 tablet by mouth in the morning and at bedtime.   folic acid 1 MG tablet Commonly known as: FOLVITE Take 1 mg by mouth daily.   isosorbide mononitrate 30 MG 24 hr tablet Commonly known as: IMDUR TAKE 1 TABLET BY MOUTH  DAILY    metoprolol tartrate 25 MG tablet Commonly known as: LOPRESSOR Take 0.5 tablets (12.5 mg total) by mouth daily. What changed:   how much to take  how to take this  when to take this  additional instructions   montelukast 10 MG tablet Commonly known as: SINGULAIR Take 10 mg by mouth at bedtime.   nitroGLYCERIN 0.4 MG SL tablet Commonly known as: NITROSTAT Place 1 tablet (0.4 mg total) under the tongue every 5 (five) minutes x 3 doses as needed for chest pain (if no relief after 3rd dose, proceed to the ED for an evaluation or call 911).   omeprazole 40 MG capsule Commonly known as: PRILOSEC Take 40 mg by mouth every morning.   ondansetron 4 MG tablet Commonly known as: ZOFRAN Take 1 tablet (4 mg total) by mouth every 6 (six) hours as needed for nausea.   oxymetazoline 0.05 % nasal spray Commonly known as: AFRIN Place 1 spray into both nostrils at bedtime.   polyethylene glycol 17 g packet Commonly known as: MIRALAX / GLYCOLAX Take 17 g by mouth daily as needed for mild constipation.   temazepam 30 MG capsule Commonly known as: RESTORIL Take 30 mg by mouth at bedtime as needed for sleep.   Vitamin D (Ergocalciferol) 1.25 MG (50000 UNIT) Caps capsule Commonly known as: DRISDOL Take 50,000 Units by mouth every 7 (seven) days.  fridays       Follow-up Information    [name], MD Follow up.   Specialty: Internal Medicine Contact information: [place] [address] [phone]        Vilinda Blanks I, MD. Call in 1 day(s).   Specialty: Internal Medicine Why: To schedule a follow up appointment  Contact information: [address] [place] [address] [phone]              Allergies  Allergen Reactions   Bee Venom Anaphylaxis   Statins     Myalgias - Simvastatin and Rosuvastatin    Consultations:  General surgery   Procedures/Studies: DG Ankle 2 Views Left  Result Date: 04/19/2020 CLINICAL DATA:  Pain and  redness with swelling of the LEFT ankle and dorsum of the foot that started yesterday EXAM: LEFT ANKLE - 2 VIEW COMPARISON:  None FINDINGS: Status post ORIF of the LEFT ankle with cortical plate and screw fixation of the distal fibula just above the lateral malleolus and lag screw and pinning of the medial malleolus. Degenerative changes about the ankle and midfoot. No acute fracture or dislocation. Enthesopathic changes about the Achilles with calcifications in the soft tissues of the plantar aspect of the foot. IMPRESSION: 1. No acute osseous abnormality in the LEFT ankle. 2. Degenerative changes about the ankle and midfoot. 3. Calcifications in the soft tissues of the plantar aspect of the foot, likely related to plantar fasciitis. Electronically Signed   By: Zetta Bills M.D.   On: 04/19/2020 11:01   CT ABDOMEN PELVIS W CONTRAST  Result Date: 04/17/2020 CLINICAL DATA:  Evaluate for abdominal abscess/infection. EXAM: CT ABDOMEN AND PELVIS WITH CONTRAST TECHNIQUE: Multidetector CT imaging of the abdomen and pelvis was performed using the standard protocol following bolus administration of intravenous contrast. CONTRAST:  18m OMNIPAQUE IOHEXOL 300 MG/ML  SOLN COMPARISON:  04/09/2020 FINDINGS: Lower chest: No acute abnormality identified. Chronic interstitial thickening and reticulation is noted within both lung bases which is progressive when compared with 05/29/2016. Left lower lobe lung nodule is again noted measuring 0.7 cm. Unchanged from 04/09/2020. This was also likely present on more remote exam from 05/29/2016. Hepatobiliary: Nodular appearance of the liver concerning for cirrhosis. There is no focal liver abnormality identified. Previous cholecystectomy. No bile duct dilatation. Pancreas: Unremarkable. No pancreatic ductal dilatation or surrounding inflammatory changes. Spleen: Normal in size without focal abnormality. Adrenals/Urinary Tract: Normal appearance of the adrenal glands. Multiple  bilateral kidney cysts are identified the largest arises from the lateral right mid kidney measuring 4.7 cm. No hydronephrosis identified bilaterally. Again seen is abnormal wall thickening involving the dome of bladder with adjacent diverticular inflammation. Stomach/Bowel: Small hiatal hernia. Stomach is otherwise unremarkable. Small bowel loops are nondilated without wall thickening or inflammation. Proximal colon is unremarkable. Wall thickening and inflammation associated with the mid sigmoid colon is again noted compatible with acute diverticulitis. Along the superior margin of the sigmoid colon, in the area of the previously characterized diverticulum, there has been progressive inflammation with increased soft tissue stranding. This area now contains fluid and gas with small tracks communicating with 2 separate segments of sigmoid colon likely reflecting small abscess with fistulization. On today's study this measures 2.3 by 2.2 cm, image 50/5. 1.7 x 1.6 cm. Along the inferior margin of the same segment of sigmoid colon is an area of extra colonic gas and fluid which is contiguous with the dome of bladder. No gas seen within the lumen of the bladder.  Vascular/Lymphatic: Aortic atherosclerosis without aneurysm. There is no significant adenopathy within the scratch no adenopathy identified within the abdomen or pelvis. Reproductive: Seed implants noted within the prostate gland. Other: No significant free fluid. No pneumoperitoneum. Left inguinal hernia contains fat only. Musculoskeletal: Lumbar degenerative disc disease. No acute or suspicious osseous findings. IMPRESSION: 1. Since 04/09/2020 there has been progressive inflammation and soft tissue stranding associated with the previously characterized sigmoid diverticulitis. 2. Similar appearance of previously characterized pericolonic abscess along the inferior margin of the sigmoid colon which extends to the dome of bladder resulting in focal bladder wall  thickening, likely secondary cystitis. No gas identified within the bladder at this time to suggest patent fistula. 3. A second pericolonic abscess is noted along the superior margin of the sigmoid colon in the area of previously characterized large sigmoid diverticula. This abscess is small and not likely amenable to percutaneous drainage at this time. 4. 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 5. Aortic atherosclerosis. Aortic Atherosclerosis (ICD10-I70.0). Electronically Signed   By: Kerby Moors M.D.   On: 04/17/2020 06:05   CT HEMATURIA WORKUP  Result Date: 04/10/2020 CLINICAL DATA:  Gross hematuria with lower abdominal and groin pain EXAM: CT ABDOMEN AND PELVIS WITHOUT AND WITH CONTRAST TECHNIQUE: Multidetector CT imaging of the abdomen and pelvis was performed following the standard protocol before and following the bolus administration of intravenous contrast. CONTRAST:  16m OMNIPAQUE IOHEXOL 300 MG/ML  SOLN COMPARISON:  Abdomen and pelvis CT from 2017 FINDINGS: Lower chest: 9 x 7 mm part solid pulmonary nodule (image 12, series 4) septal thickening at the lung bases as well with some subpleural reticulation. Concern in the LEFT lung base on the prior exam measuring approximately 5-6 mm. Mild septal thickening also present at the RIGHT lung base with parenchymal scarring as slightly worse than the previous exam. On prone imaging there is persistent subpleural reticulation and there is some nodularity that is more evident (image 41, series 15) 5 mm juxta diaphragmatic pulmonary nodule. No consolidation or sign of pleural effusion. Multiple small nodules are seen at the RIGHT lung base as well, for instance on image 14 of series 15 there are at least 5/6 additional small nodules, a 4 mm nodule in the RIGHT lung base serving as an example of multiple small nodules. Hepatobiliary: Mildly nodular hepatic contours with fissural widening. Portal vein is patent liver contours are  unchanged since 2017. No focal, suspicious hepatic lesion. Post cholecystectomy without gross biliary duct distension. Pancreas: Pancreas without focal lesion or ductal dilation. No signs of peripancreatic inflammation. Spleen: Spleen normal in size and contour with no focal lesion. Small adjacent splenule. Perisplenic collaterals in the LEFT hemiabdomen. Splenic vein remains patent as does the SMV. Adrenals/Urinary Tract: Adrenal glands are normal. Bilateral renal cysts, largest on the RIGHT approximately 4.5 x 3.4 cm. No hydronephrosis. No ureteral calculus. Small lower pole calculus on the LEFT. This measures approximately 3 mm. Mild perivesical stranding. Bladder wall thickening with adjacent diverticular inflammation Large inflamed diverticulum versus small pericolonic abscess from diverticulitis sits atop the urinary bladder. (Image 71 series 7) and image 52, series 11) there is colonic thickening as well in this area. No sign of upper tract lesion. Stomach/Bowel: Small hiatal hernia. No signs of small bowel obstruction or acute small bowel process. Post appendectomy. Segmental colonic thickening more pronounced adjacent to pericolonic fluid and gas that abuts the urinary bladder and tracks along the dome of the bladder. This area measures approximately 2.3 x 1.9 cm.  No gas in the urinary bladder at this time. Extending superiorly from the sigmoid colon is a gas containing tract best seen on image 71 of series 12. There is a diverticulum in this area on the previous study but this appears more pronounced and is associated with fluid density that extends superiorly from the colon, adjacent to a wide-mouth diverticulum. There is no pericolonic inflammation in this location. The area extending from the gas containing tract abuts small bowel loops measuring approximately 2.1 x 1.4 cm. Vascular/Lymphatic: Calcified atheromatous plaque in the abdominal aorta, no aneurysm. No adenopathy in the retroperitoneum. Small  lymph nodes in the upper abdomen are unchanged. No pelvic lymphadenopathy. Reproductive: Post uro lift procedure. Post RIGHT orchiectomy. Other: No free air.  No ascites. Musculoskeletal: No acute musculoskeletal process. No destructive bone finding. Spinal degenerative changes. IMPRESSION: 1. Pericolonic abscess likely related to diverticulitis between the sigmoid and the urinary bladder with thickening of the wall of the urinary bladder, likely secondary cystitis. No current signs of colovesical fistula though with this appearance fistula formation is a risk. 2. Eccentric thickening of the inferior wall of the colon is noted, potentially related to more pronounced inflammation in this location would however suggest follow-up colonoscopy to exclude underlying colonic lesion. 3. Small gas containing tract extending superiorly from the sigmoid slightly downstream from this area of abnormality was present previously but appears slightly larger than on the prior study. Significance uncertain. Potentially related to prior diverticulitis. 4. No free air or ascites. 5. No signs of upper tract lesion with renal cortical scarring, nephrolithiasis and renal cysts. 6. 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 Correlate with any clinical or laboratory evidence of liver disease. 7. Multiple small pulmonary nodules at the lung bases with septal thickening and subpleural reticulation. Correlate with any risk factors for aspiration or history of chronic infection. Given the 8 mm mean diameter pulmonary nodule which has enlarged since prior imaging consider dedicated chest CT and/or comparison with prior evaluations. Based on spiculated appearance scarring or bronchogenic neoplasm could have this appearance. 8. Aortic atherosclerosis. Aortic Atherosclerosis (ICD10-I70.0). Electronically Signed   By: Zetta Bills M.D.   On: 04/10/2020 16:57   VAS [place] LOWER EXTREMITY VENOUS (DVT)  Result Date:  04/19/2020  Lower Venous DVTStudy Indications: Edema.  Comparison Study: no prior Performing Technologist: Abram Sander RVS  Examination Guidelines: A complete evaluation includes B-mode imaging, spectral Doppler, color Doppler, and power Doppler as needed of all accessible portions of each vessel. Bilateral testing is considered an integral part of a complete examination. Limited examinations for reoccurring indications may be performed as noted. The reflux portion of the exam is performed with the patient in reverse Trendelenburg.  +-----+---------------+---------+-----------+----------+--------------+  RIGHT Compressibility Phasicity Spontaneity Properties Thrombus Aging  +-----+---------------+---------+-----------+----------+--------------+  CFV   Full            Yes       Yes                                    +-----+---------------+---------+-----------+----------+--------------+   +---------+---------------+---------+-----------+----------+--------------+  LEFT      Compressibility Phasicity Spontaneity Properties Thrombus Aging  +---------+---------------+---------+-----------+----------+--------------+  CFV       Full            Yes       Yes                                    +---------+---------------+---------+-----------+----------+--------------+  SFJ       Full                                                             +---------+---------------+---------+-----------+----------+--------------+  FV Prox   Full                                                             +---------+---------------+---------+-----------+----------+--------------+  FV Mid    Full                                                             +---------+---------------+---------+-----------+----------+--------------+  FV Distal Full                                                             +---------+---------------+---------+-----------+----------+--------------+  PFV       Full                                                              +---------+---------------+---------+-----------+----------+--------------+  POP       Full            Yes       Yes                                    +---------+---------------+---------+-----------+----------+--------------+  PTV       Full                                                             +---------+---------------+---------+-----------+----------+--------------+  PERO      Full                                                             +---------+---------------+---------+-----------+----------+--------------+     Summary: RIGHT: - No evidence of common femoral vein obstruction.  LEFT: - There is no evidence of deep vein thrombosis in the lower extremity.  - No cystic structure found in the popliteal fossa.  *See table(s) above for measurements and observations. Electronically signed by Ruta Hinds MD on 04/19/2020 at 4:22:43  PM.    Final     (Echo, Carotid, EGD, Colonoscopy, ERCP)    Subjective: Patient is resting in bed in no acute distress anxious to go home tolerating full liquid diet  Discharge Exam: Vitals:   04/19/20 2039 04/20/20 0905  BP: (!) 112/59   Pulse: 66   Resp: 14   Temp: 98.2 F (36.8 C)   SpO2: 93% 96%   Vitals:   04/19/20 1115 04/19/20 1356 04/19/20 2039 04/20/20 0905  BP: (!) 105/52 128/70 (!) 112/59   Pulse: 83 84 66   Resp: 18 18 14    Temp: 99.2 F (37.3 C) 100.1 F (37.8 C) 98.2 F (36.8 C)   TempSrc: Oral Oral Oral   SpO2: 92% 91% 93% 96%  Weight:      Height:        General: Pt is alert, awake, not in acute distress Cardiovascular: RRR, S1/S2 +, no rubs, no gallops Respiratory: CTA bilaterally, no wheezing, no rhonchi Abdominal: Soft, NT, ND, bowel sounds + Extremities: no edema, no cyanosis    The results of significant diagnostics from this hospitalization (including imaging, microbiology, ancillary and laboratory) are listed below for reference.     Microbiology: Recent Results (from the past 240 hour(s))   Blood culture (routine x 2)     Status: None (Preliminary result)   Collection Time: 04/17/20  1:01 AM   Specimen: BLOOD  Result Value Ref Range Status   Specimen Description BLOOD RIGHT ANTECUBITAL  Final   Special Requests   Final    BOTTLES DRAWN AEROBIC AND ANAEROBIC Blood Culture adequate volume   Culture   Final    NO GROWTH 3 DAYS Performed at [facility], [address] [place] [address] [place], [place] [address]    Report Status PENDING  Incomplete  Blood culture (routine x 2)     Status: None (Preliminary result)   Collection Time: 04/17/20  1:55 AM   Specimen: BLOOD  Result Value Ref Range Status   Specimen Description BLOOD LEFT ANTECUBITAL  Final   Special Requests   Final    BOTTLES DRAWN AEROBIC AND ANAEROBIC Blood Culture adequate volume   Culture   Final    NO GROWTH 3 DAYS Performed at [facility], [address] [place], [place] [address]    Report Status PENDING  Incomplete  SARS Coronavirus 2 by RT PCR (hospital order, performed in [place] hospital lab) Nasopharyngeal Nasopharyngeal Swab     Status: None   Collection Time: 04/17/20  2:41 AM   Specimen: Nasopharyngeal Swab  Result Value Ref Range Status   SARS Coronavirus 2 NEGATIVE NEGATIVE Final    Comment: (NOTE) SARS-CoV-2 target nucleic acids are NOT DETECTED.  The SARS-CoV-2 RNA is generally detectable in upper and lower respiratory specimens during the acute phase of infection. The lowest concentration of SARS-CoV-2 viral copies this assay can detect is 250 copies / mL. A negative result does not preclude SARS-CoV-2 infection and should not be used as the sole basis for treatment or other patient management decisions.  A negative result may occur with improper specimen collection / handling, submission of specimen other than nasopharyngeal swab, presence of viral mutation(s) within the areas targeted by this assay, and inadequate number of viral copies (<250 copies / mL). A negative result must be combined  with clinical observations, patient history, and epidemiological information.  Fact Sheet for Patients:   StrictlyIdeas.no  Fact Sheet for Healthcare Providers: BankingDealers.co.za  This test is not yet approved or  cleared by the [place] and has been authorized for detection and/or diagnosis of SARS-CoV-2 by FDA under an Emergency Use Authorization (EUA).  This EUA will remain in effect (meaning this test can be used) for the duration of the COVID-19 declaration under Section 564(b)(1) of the Act, 21 U.S.C. section 360bbb-3(b)(1), unless the authorization is terminated or revoked sooner.  Performed at [facility], [address] [place], [place] [address]      Labs: BNP (last 3 results) No results for input(s): BNP in the last 8760 hours. Basic Metabolic Panel: Recent Labs  Lab 04/16/20 2026 04/18/20 0256 04/20/20 0219  NA 136 135 138  K 3.0* 3.6 3.2*  CL 97* 101 100  CO2 26 27 28   GLUCOSE 109* 98 111*  BUN 10 7* <5*  CREATININE 0.99 1.01 0.96  CALCIUM 9.3 8.8* 8.6*  MG  --   --  1.8   Liver Function Tests: Recent Labs  Lab 04/16/20 2026  AST 20  ALT 18  ALKPHOS 79  BILITOT 2.1*  PROT 8.2*  ALBUMIN 4.4   Recent Labs  Lab 04/16/20 2026  LIPASE 31   No results for input(s): AMMONIA in the last 168 hours. CBC: Recent Labs  Lab 04/16/20 2026 04/17/20 0101 04/18/20 0256 04/20/20 0219  WBC 13.0* 12.3* 10.1 7.7  NEUTROABS  --  9.6*  --   --   HGB 13.8 13.5 10.9* 10.2*  HCT 43.3 42.3 34.7* 31.6*  MCV 87.1 86.9 85.9 85.9  PLT 308 252 254 240   Cardiac Enzymes: No results for input(s): CKTOTAL, CKMB, CKMBINDEX, TROPONINI in the last 168 hours. BNP: Invalid input(s): POCBNP CBG: No results for input(s): GLUCAP in the last 168 hours. D-Dimer No results for input(s): DDIMER in the last 72 hours. Hgb A1c No results for input(s): HGBA1C in the last 72 hours. Lipid Profile No results for  input(s): CHOL, HDL, LDLCALC, TRIG, CHOLHDL, LDLDIRECT in the last 72 hours. Thyroid function studies No results for input(s): TSH, T4TOTAL, T3FREE, THYROIDAB in the last 72 hours.  Invalid input(s): FREET3 Anemia work up No results for input(s): VITAMINB12, FOLATE, FERRITIN, TIBC, IRON, RETICCTPCT in the last 72 hours. Urinalysis    Component Value Date/Time   COLORURINE YELLOW 04/17/2020 0250   APPEARANCEUR CLEAR 04/17/2020 0250   APPEARANCEUR Clear 03/18/2020 0937   LABSPEC 1.008 04/17/2020 0250   PHURINE 6.0 04/17/2020 0250   GLUCOSEU NEGATIVE 04/17/2020 0250   HGBUR SMALL (A) 04/17/2020 0250   BILIRUBINUR NEGATIVE 04/17/2020 0250   BILIRUBINUR Negative 03/18/2020 [place] 04/17/2020 0250   PROTEINUR NEGATIVE 04/17/2020 0250   NITRITE NEGATIVE 04/17/2020 0250   LEUKOCYTESUR NEGATIVE 04/17/2020 0250   Sepsis Labs Invalid input(s): PROCALCITONIN,  WBC,  LACTICIDVEN Microbiology Recent Results (from the past 240 hour(s))  Blood culture (routine x 2)     Status: None (Preliminary result)   Collection Time: 04/17/20  1:01 AM   Specimen: BLOOD  Result Value Ref Range Status   Specimen Description BLOOD RIGHT ANTECUBITAL  Final   Special Requests   Final    BOTTLES DRAWN AEROBIC AND ANAEROBIC Blood Culture adequate volume   Culture   Final    NO GROWTH 3 DAYS Performed at [facility], [address] [place], [place] [address]    Report Status PENDING  Incomplete  Blood culture (routine x 2)     Status: None (Preliminary result)   Collection Time: 04/17/20  1:55 AM   Specimen: BLOOD  Result Value  Ref Range Status   Specimen Description BLOOD LEFT ANTECUBITAL  Final   Special Requests   Final    BOTTLES DRAWN AEROBIC AND ANAEROBIC Blood Culture adequate volume   Culture   Final    NO GROWTH 3 DAYS Performed at [facility], [address] [place], [place] [address]    Report Status PENDING  Incomplete  SARS Coronavirus 2 by RT PCR (hospital order,  performed in [facility] hospital lab) Nasopharyngeal Nasopharyngeal Swab     Status: None   Collection Time: 04/17/20  2:41 AM   Specimen: Nasopharyngeal Swab  Result Value Ref Range Status   SARS Coronavirus 2 NEGATIVE NEGATIVE Final    Comment: (NOTE) SARS-CoV-2 target nucleic acids are NOT DETECTED.  The SARS-CoV-2 RNA is generally detectable in upper and lower respiratory specimens during the acute phase of infection. The lowest concentration of SARS-CoV-2 viral copies this assay can detect is 250 copies / mL. A negative result does not preclude SARS-CoV-2 infection and should not be used as the sole basis for treatment or other patient management decisions.  A negative result may occur with improper specimen collection / handling, submission of specimen other than nasopharyngeal swab, presence of viral mutation(s) within the areas targeted by this assay, and inadequate number of viral copies (<250 copies / mL). A negative result must be combined with clinical observations, patient history, and epidemiological information.  Fact Sheet for Patients:   StrictlyIdeas.no  Fact Sheet for Healthcare Providers: BankingDealers.co.za  This test is not yet approved or  cleared by the [place] FDA and has been authorized for detection and/or diagnosis of SARS-CoV-2 by FDA under an Emergency Use Authorization (EUA).  This EUA will remain in effect (meaning this test can be used) for the duration of the COVID-19 declaration under Section 564(b)(1) of the Act, 21 U.S.C. section 360bbb-3(b)(1), unless the authorization is terminated or revoked sooner.  Performed at [facility], [address], [place], [place] [address]      Time coordinating discharge: 37 minutes  SIGNED:   Georgette Shell, MD  [facility] 04/20/2020, 9:40 AM

## 2020-04-20 NOTE — Progress Notes (Addendum)
Subjective: CC: Doing well. Pain resolved yesterday and he is pain free this morning. Tolerating diet without n/v. Passing flatus. BM yesterday that was formed.   Objective: Vital signs in last 24 hours: Temp:  [98.2 F (36.8 C)-100.1 F (37.8 C)] 98.2 F (36.8 C) (08/30 2039) Pulse Rate:  [66-84] 66 (08/30 2039) Resp:  [14-18] 14 (08/30 2039) BP: (105-128)/(52-70) 112/59 (08/30 2039) SpO2:  [91 %-93 %] 93 % (08/30 2039) Last BM Date: 04/19/20  Intake/Output from previous day: 08/30 0701 - 08/31 0700 In: 60 [P.O.:60] Out: 1275 [Urine:1275] Intake/Output this shift: No intake/output data recorded.  PE: Gen:  Alert, NAD, pleasant Pulm: normal rate and effort  Abd: Soft, NT/ND, +BS Psych: A&Ox3  Skin: no rashes noted, warm and dry  Lab Results:  Recent Labs    04/18/20 0256 04/20/20 0219  WBC 10.1 7.7  HGB 10.9* 10.2*  HCT 34.7* 31.6*  PLT 254 240   BMET Recent Labs    04/18/20 0256 04/20/20 0219  NA 135 138  K 3.6 3.2*  CL 101 100  CO2 27 28  GLUCOSE 98 111*  BUN 7* <5*  CREATININE 1.01 0.96  CALCIUM 8.8* 8.6*   PT/INR No results for input(s): LABPROT, INR in the last 72 hours. CMP     Component Value Date/Time   NA 138 04/20/2020 0219   NA 136 03/18/2020 1129   K 3.2 (L) 04/20/2020 0219   CL 100 04/20/2020 0219   CO2 28 04/20/2020 0219   GLUCOSE 111 (H) 04/20/2020 0219   BUN <5 (L) 04/20/2020 0219   BUN 21 03/18/2020 1129   CREATININE 0.96 04/20/2020 0219   CREATININE 0.86 10/18/2017 1648   CALCIUM 8.6 (L) 04/20/2020 0219   PROT 8.2 (H) 04/16/2020 2026   ALBUMIN 4.4 04/16/2020 2026   AST 20 04/16/2020 2026   ALT 18 04/16/2020 2026   ALKPHOS 79 04/16/2020 2026   BILITOT 2.1 (H) 04/16/2020 2026   GFRNONAA >60 04/20/2020 0219   GFRAA >60 04/20/2020 0219   Lipase     Component Value Date/Time   LIPASE 31 04/16/2020 2026       Studies/Results: DG Ankle 2 Views Left  Result Date: 04/19/2020 CLINICAL DATA:  Pain and redness  with swelling of the LEFT ankle and dorsum of the foot that started yesterday EXAM: LEFT ANKLE - 2 VIEW COMPARISON:  None FINDINGS: Status post ORIF of the LEFT ankle with cortical plate and screw fixation of the distal fibula just above the lateral malleolus and lag screw and pinning of the medial malleolus. Degenerative changes about the ankle and midfoot. No acute fracture or dislocation. Enthesopathic changes about the Achilles with calcifications in the soft tissues of the plantar aspect of the foot. IMPRESSION: 1. No acute osseous abnormality in the LEFT ankle. 2. Degenerative changes about the ankle and midfoot. 3. Calcifications in the soft tissues of the plantar aspect of the foot, likely related to plantar fasciitis. Electronically Signed   By: Zetta Bills M.D.   On: 04/19/2020 11:01   VAS [place] LOWER EXTREMITY VENOUS (DVT)  Result Date: 04/19/2020  Lower Venous DVTStudy Indications: Edema.  Comparison Study: no prior Performing Technologist: Abram Sander RVS  Examination Guidelines: A complete evaluation includes B-mode imaging, spectral Doppler, color Doppler, and power Doppler as needed of all accessible portions of each vessel. Bilateral testing is considered an integral part of a complete examination. Limited examinations for reoccurring indications may be performed as noted. The reflux  portion of the exam is performed with the patient in reverse Trendelenburg.  +-----+---------------+---------+-----------+----------+--------------+ RIGHTCompressibilityPhasicitySpontaneityPropertiesThrombus Aging +-----+---------------+---------+-----------+----------+--------------+ CFV  Full           Yes      Yes                                 +-----+---------------+---------+-----------+----------+--------------+   +---------+---------------+---------+-----------+----------+--------------+ LEFT     CompressibilityPhasicitySpontaneityPropertiesThrombus Aging  +---------+---------------+---------+-----------+----------+--------------+ CFV      Full           Yes      Yes                                 +---------+---------------+---------+-----------+----------+--------------+ SFJ      Full                                                        +---------+---------------+---------+-----------+----------+--------------+ FV Prox  Full                                                        +---------+---------------+---------+-----------+----------+--------------+ FV Mid   Full                                                        +---------+---------------+---------+-----------+----------+--------------+ FV DistalFull                                                        +---------+---------------+---------+-----------+----------+--------------+ PFV      Full                                                        +---------+---------------+---------+-----------+----------+--------------+ POP      Full           Yes      Yes                                 +---------+---------------+---------+-----------+----------+--------------+ PTV      Full                                                        +---------+---------------+---------+-----------+----------+--------------+ PERO     Full                                                        +---------+---------------+---------+-----------+----------+--------------+  Summary: RIGHT: - No evidence of common femoral vein obstruction.  LEFT: - There is no evidence of deep vein thrombosis in the lower extremity.  - No cystic structure found in the popliteal fossa.  *See table(s) above for measurements and observations. Electronically signed by Ruta Hinds MD on 04/19/2020 at 4:22:43 PM.    Final     Anti-infectives: Anti-infectives (From admission, onward)   Start     Dose/Rate Route Frequency Ordered Stop   04/17/20 0800  piperacillin-tazobactam (ZOSYN)  IVPB 3.375 g        3.375 g 12.5 mL/hr over 240 Minutes Intravenous Every 8 hours 04/17/20 0519     04/17/20 0100  piperacillin-tazobactam (ZOSYN) IVPB 3.375 g        3.375 g 100 mL/hr over 30 Minutes Intravenous  Once 04/17/20 0049 04/17/20 0141       Assessment/Plan Complicated Diverticulitis  - Intra-abdominal abscesses are not IR amenable to drainage - Cont abx. Will need 10-14 days total. - Adv to soft diet  -Patient notes hx of UC that he was previously on sulfadiazine after colonoscopy in 2013. He is followed by GI, Dr. Vilinda Blanks, in [place]. Notes he has been off medications for the last 1 year. 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 He also had a colonoscopy 02/06/2020 (see media section) that showed an ulcer in the proximal descending colon, interestingly no diverticuli. I reviewed case with Dr. Watt Climes of GI yesterday. He recommended continue current course of treatment with abx and close follow up with patients GI doctor. If patient worsened to call back.  - Patients WBC has normalized and pain has resolved. If patient tolerates soft diet, he is okay for d/c from our standpoint with 10-14 days of abx total and close follow up with his GI doctor.   FEN -Soft  VTE -SCDs, Lovenox  ID -Zosyn 8/28 >>   LOS: 3 days    Jillyn Ledger , [facility] 04/20/2020, 8:22 AM Please see Amion for pager number during day hours 7:00am-4:30pm

## 2020-04-21 ENCOUNTER — Other Ambulatory Visit: Payer: Medicare Other | Admitting: Urology

## 2020-04-22 ENCOUNTER — Ambulatory Visit: Payer: Medicare Other | Admitting: Urology

## 2020-04-22 DIAGNOSIS — N400 Enlarged prostate without lower urinary tract symptoms: Secondary | ICD-10-CM

## 2020-04-22 DIAGNOSIS — R310 Gross hematuria: Secondary | ICD-10-CM

## 2020-04-22 LAB — CULTURE, BLOOD (ROUTINE X 2)
Culture: NO GROWTH
Culture: NO GROWTH
Special Requests: ADEQUATE
Special Requests: ADEQUATE

## 2020-04-27 DIAGNOSIS — K5720 Diverticulitis of large intestine with perforation and abscess without bleeding: Secondary | ICD-10-CM | POA: Diagnosis not present

## 2020-04-27 DIAGNOSIS — K760 Fatty (change of) liver, not elsewhere classified: Secondary | ICD-10-CM | POA: Diagnosis not present

## 2020-04-27 DIAGNOSIS — K5150 Left sided colitis without complications: Secondary | ICD-10-CM | POA: Diagnosis not present

## 2020-04-28 DIAGNOSIS — K5190 Ulcerative colitis, unspecified, without complications: Secondary | ICD-10-CM | POA: Diagnosis not present

## 2020-04-29 ENCOUNTER — Other Ambulatory Visit: Payer: Self-pay

## 2020-04-29 ENCOUNTER — Telehealth: Payer: Self-pay

## 2020-04-29 DIAGNOSIS — R310 Gross hematuria: Secondary | ICD-10-CM

## 2020-04-29 MED ORDER — CIPROFLOXACIN HCL 500 MG PO TABS: 500.0000 mg | ORAL_TABLET | Freq: Two times a day (BID) | ORAL | 0 refills | Status: AC

## 2020-04-29 MED ORDER — CEPHALEXIN 500 MG PO CAPS: 500.0000 mg | ORAL_CAPSULE | Freq: Two times a day (BID) | ORAL | 0 refills | Status: AC

## 2020-04-29 NOTE — Telephone Encounter (Signed)
Pt called and made aware. Order given to stop Augmentin per Dr. Alyson Ingles.

## 2020-04-29 NOTE — Telephone Encounter (Signed)
-----   Message from Cleon Gustin, MD sent at 04/27/2020  9:52 AM EDT ----- I talked to Arnoldo Morale about this patient. He suggested sending an additional 2 week dose of both cipro and flagyl ----- Message ----- From: Iris Pert, LPN Sent: 10/26/5692   8:59 AM EDT To: Cleon Gustin, MD  Please review

## 2020-04-30 DIAGNOSIS — K5732 Diverticulitis of large intestine without perforation or abscess without bleeding: Secondary | ICD-10-CM | POA: Diagnosis not present

## 2020-04-30 DIAGNOSIS — Z6829 Body mass index (BMI) 29.0-29.9, adult: Secondary | ICD-10-CM | POA: Diagnosis not present

## 2020-04-30 DIAGNOSIS — M1990 Unspecified osteoarthritis, unspecified site: Secondary | ICD-10-CM | POA: Diagnosis not present

## 2020-04-30 DIAGNOSIS — L4050 Arthropathic psoriasis, unspecified: Secondary | ICD-10-CM | POA: Diagnosis not present

## 2020-04-30 DIAGNOSIS — K7460 Unspecified cirrhosis of liver: Secondary | ICD-10-CM | POA: Diagnosis not present

## 2020-04-30 DIAGNOSIS — Z299 Encounter for prophylactic measures, unspecified: Secondary | ICD-10-CM | POA: Diagnosis not present

## 2020-04-30 DIAGNOSIS — I10 Essential (primary) hypertension: Secondary | ICD-10-CM | POA: Diagnosis not present

## 2020-05-14 DIAGNOSIS — M5416 Radiculopathy, lumbar region: Secondary | ICD-10-CM | POA: Diagnosis not present

## 2020-05-14 DIAGNOSIS — M546 Pain in thoracic spine: Secondary | ICD-10-CM | POA: Diagnosis not present

## 2020-05-17 DIAGNOSIS — M545 Low back pain: Secondary | ICD-10-CM | POA: Diagnosis not present

## 2020-05-17 DIAGNOSIS — M5410 Radiculopathy, site unspecified: Secondary | ICD-10-CM | POA: Diagnosis not present

## 2020-05-17 DIAGNOSIS — M5416 Radiculopathy, lumbar region: Secondary | ICD-10-CM | POA: Diagnosis not present

## 2020-05-20 DIAGNOSIS — I10 Essential (primary) hypertension: Secondary | ICD-10-CM | POA: Diagnosis not present

## 2020-05-20 DIAGNOSIS — I2510 Atherosclerotic heart disease of native coronary artery without angina pectoris: Secondary | ICD-10-CM | POA: Diagnosis not present

## 2020-05-27 ENCOUNTER — Encounter: Payer: Self-pay | Admitting: Urology

## 2020-05-27 ENCOUNTER — Other Ambulatory Visit: Payer: Self-pay

## 2020-05-27 ENCOUNTER — Ambulatory Visit (INDEPENDENT_AMBULATORY_CARE_PROVIDER_SITE_OTHER): Payer: Medicare Other | Admitting: Urology

## 2020-05-27 VITALS — BP 132/82 | HR 76 | Temp 98.6°F | Ht 73.0 in | Wt 220.0 lb

## 2020-05-27 DIAGNOSIS — N400 Enlarged prostate without lower urinary tract symptoms: Secondary | ICD-10-CM | POA: Diagnosis not present

## 2020-05-27 LAB — URINALYSIS, ROUTINE W REFLEX MICROSCOPIC
Bilirubin, UA: NEGATIVE
Glucose, UA: NEGATIVE
Ketones, UA: NEGATIVE
Leukocytes,UA: NEGATIVE
Nitrite, UA: NEGATIVE
Protein,UA: NEGATIVE
RBC, UA: NEGATIVE
Specific Gravity, UA: 1.015 (ref 1.005–1.030)
Urobilinogen, Ur: 0.2 mg/dL (ref 0.2–1.0)
pH, UA: 7 (ref 5.0–7.5)

## 2020-05-27 MED ORDER — FINASTERIDE 5 MG PO TABS: 5.0000 mg | ORAL_TABLET | Freq: Every day | ORAL | 3 refills | Status: DC

## 2020-05-27 NOTE — Progress Notes (Signed)
Urological Symptom Review  Patient is experiencing the following symptoms: Blood in urine   Review of Systems  Gastrointestinal (upper)  : Negative for upper GI symptoms  Gastrointestinal (lower) : Negative for lower GI symptoms  Constitutional : Fatigue  Skin: Negative for skin symptoms  Eyes: Negative for eye symptoms  Ear/Nose/Throat : Negative for Ear/Nose/Throat symptoms  Hematologic/Lymphatic: Negative for Hematologic/Lymphatic symptoms  Cardiovascular : Leg swelling  Respiratory : Shortness of breath  Endocrine: Negative for endocrine symptoms  Musculoskeletal: Back pain  Neurological: Negative for neurological symptoms  Psychologic: Negative for psychiatric symptoms

## 2020-05-27 NOTE — Patient Instructions (Signed)
Hematuria, Adult Hematuria is blood in the urine. Blood may be visible in the urine, or it may be identified with a test. This condition can be caused by infections of the bladder, urethra, kidney, or prostate. Other possible causes include:  Kidney stones.  Cancer of the urinary tract.  Too much calcium in the urine.  Conditions that are passed from parent to child (inherited conditions).  Exercise that requires a lot of energy. Infections can usually be treated with medicine, and a kidney stone usually will pass through your urine. If neither of these is the cause of your hematuria, more tests may be needed to identify the cause of your symptoms. It is very important to tell your health care provider about any blood in your urine, even if it is painless or the blood stops without treatment. Blood in the urine, when it happens and then stops and then happens again, can be a symptom of a very serious condition, including cancer. There is no pain in the initial stages of many urinary cancers. Follow these instructions at home: Medicines  Take over-the-counter and prescription medicines only as told by your health care provider.  If you were prescribed an antibiotic medicine, take it as told by your health care provider. Do not stop taking the antibiotic even if you start to feel better. Eating and drinking  Drink enough fluid to keep your urine clear or pale yellow. It is recommended that you drink 3-4 quarts (2.8-3.8 L) a day. If you have been diagnosed with an infection, it is recommended that you drink cranberry juice in addition to large amounts of water.  Avoid caffeine, tea, and carbonated beverages. These tend to irritate the bladder.  Avoid alcohol because it may irritate the prostate (men). General instructions  If you have been diagnosed with a kidney stone, follow your health care provider's instructions about straining your urine to catch the stone.  Empty your bladder  often. Avoid holding urine for long periods of time.  If you are male: ? After a bowel movement, wipe from front to back and use each piece of toilet paper only once. ? Empty your bladder before and after sex.  Pay attention to any changes in your symptoms. Tell your health care provider about any changes or any new symptoms.  It is your responsibility to get your test results. Ask your health care provider, or the department performing the test, when your results will be ready.  Keep all follow-up visits as told by your health care provider. This is important. Contact a health care provider if:  You develop back pain.  You have a fever.  You have nausea or vomiting.  Your symptoms do not improve after 3 days.  Your symptoms get worse. Get help right away if:  You develop severe vomiting and are unable take medicine without vomiting.  You develop severe pain in your back or abdomen even though you are taking medicine.  You pass a large amount of blood in your urine.  You pass blood clots in your urine.  You feel very weak or like you might faint.  You faint. Summary  Hematuria is blood in the urine. It has many possible causes.  It is very important that you tell your health care provider about any blood in your urine, even if it is painless or the blood stops without treatment.  Take over-the-counter and prescription medicines only as told by your health care provider.  Drink enough fluid to keep   your urine clear or pale yellow. This information is not intended to replace advice given to you by your health care provider. Make sure you discuss any questions you have with your health care provider. Document Revised: 01/01/2019 Document Reviewed: 09/09/2016 Elsevier Patient Education  2020 Elsevier Inc.  

## 2020-05-27 NOTE — Progress Notes (Signed)
   05/27/20  CC: Gross hematuria  HPI: Mr Richard Webb is a 74yo here for followup for gross hematuria.  Blood pressure 132/82, pulse 76, temperature 98.6 F (37 C), height 6' 1"  (1.854 m), weight 220 lb (99.8 kg). NED. A&Ox3.   No respiratory distress   Abd soft, NT, ND Normal phallus with bilateral descended testicles  Cystoscopy Procedure Note  Patient identification was confirmed, informed consent was obtained, and patient was prepped using Betadine solution.  Lidocaine jelly was administered per urethral meatus.     Pre-Procedure: - Inspection reveals a normal caliber ureteral meatus.  Procedure: The flexible cystoscope was introduced without difficulty - No urethral strictures/lesions are present. - Enlarged prostate multiple prostatic varices - Normal bladder neck - Bilateral ureteral orifices identified - Bladder mucosa  reveals no ulcers, tumors, or lesions - No bladder stones - No trabeculation  Retroflexion shows 1cm intravesical prostatic protrusion   Post-Procedure: - Patient tolerated the procedure well  Assessment/ Plan: Start finasteride 11m daily  Return in about 3 months (around 08/27/2020).  PNicolette Bang MD

## 2020-05-28 ENCOUNTER — Ambulatory Visit: Payer: Medicare Other | Admitting: Cardiology

## 2020-06-15 DIAGNOSIS — Z299 Encounter for prophylactic measures, unspecified: Secondary | ICD-10-CM | POA: Diagnosis not present

## 2020-06-15 DIAGNOSIS — I10 Essential (primary) hypertension: Secondary | ICD-10-CM | POA: Diagnosis not present

## 2020-06-15 DIAGNOSIS — G4733 Obstructive sleep apnea (adult) (pediatric): Secondary | ICD-10-CM | POA: Diagnosis not present

## 2020-06-15 DIAGNOSIS — Z23 Encounter for immunization: Secondary | ICD-10-CM | POA: Diagnosis not present

## 2020-06-15 DIAGNOSIS — L4050 Arthropathic psoriasis, unspecified: Secondary | ICD-10-CM | POA: Diagnosis not present

## 2020-06-15 DIAGNOSIS — Z6829 Body mass index (BMI) 29.0-29.9, adult: Secondary | ICD-10-CM | POA: Diagnosis not present

## 2020-06-18 DIAGNOSIS — I2510 Atherosclerotic heart disease of native coronary artery without angina pectoris: Secondary | ICD-10-CM | POA: Diagnosis not present

## 2020-06-18 DIAGNOSIS — I10 Essential (primary) hypertension: Secondary | ICD-10-CM | POA: Diagnosis not present

## 2020-07-07 ENCOUNTER — Ambulatory Visit: Payer: Medicare Other | Admitting: Urology

## 2020-07-09 NOTE — Progress Notes (Signed)
Cardiology Office Note  Date: 07/12/2020   ID: [name], DOB [birth year], MRN [id number]  PCP:  [name], MD  Cardiologist:  Rozann Lesches, MD Electrophysiologist:  None   Chief Complaint  Patient presents with  . Cardiac follow-up    History of Present Illness: Richard Webb is a 74 y.o. male last seen in April.  He presents for a routine visit.  Reports stable angina symptoms, no increasing dyspnea on exertion with typical activities.  He does fatigue and has to pace himself.  He was hospitalized back in August with an episode of diverticulitis.  Has not had follow-up with surgeon as yet, but anticipates that he may need to have a partial hemicolectomy at some point.  He does not describe any recent abdominal pain or hematochezia.  He also reports being seen at an outside facility in [place] prior to his admission in August with evidence of fluid overload and leg swelling.  He was started on HCTZ at that time and has lost a significant amount of weight.  His last echocardiogram was in 2019 at which point LVEF was in the range of 55 to 60%.  Follow-up lipid panel from April is outlined below.  Past Medical History:  Diagnosis Date  . Arthritis   . Asthma   . Coronary atherosclerosis of native coronary artery    a. DES x 3 RCA and DES mCx 10/11 - [place] - with residual 50-70% stenosis in the distal Cx in AV groove, 70% distal LAD beyond apex, 70% small D1. b. 12/2017: similar results with multivessel CAD. Patent stents. Medical management recommended.   Marland Kitchen GERD (gastroesophageal reflux disease)   . History of kidney stones   . Mixed hyperlipidemia   . Obstructive sleep apnea   . Pulmonary hypertension (HCC)    PASP 50 mmHg  . Restless leg syndrome   . Sarcoidosis   . Statin intolerance   . Ulcerative colitis [facility])     Past Surgical History:  Procedure Laterality Date  . Ankle fracture repair Left 1985  . CARDIAC CATHETERIZATION     stents x4  .  CHOLECYSTECTOMY  1970  . CYSTOSCOPY WITH INSERTION OF UROLIFT N/A 08/27/2017   Procedure: CYSTOSCOPY WITH INSERTION OF UROLIFT;  Surgeon: Cleon Gustin, MD;  Location: [facility];  Service: Urology;  Laterality: N/A;  . LAPAROSCOPIC APPENDECTOMY  2013  . LEFT HEART CATH AND CORONARY ANGIOGRAPHY N/A 01/02/2018   Procedure: LEFT HEART CATH AND CORONARY ANGIOGRAPHY;  Surgeon: Troy Sine, MD;  Location: [place] [facility];  Service: Cardiovascular;  Laterality: N/A;  . Right total knee replacement Bilateral 2005    Current Outpatient Medications  Medication Sig Dispense Refill  . amLODipine (NORVASC) 2.5 MG tablet Take 1 tablet (2.5 mg total) by mouth daily. 30 tablet 2  . aspirin EC 81 MG tablet Take 162 mg by mouth daily.    Marland Kitchen BREO ELLIPTA 200-25 MCG/INH AEPB Inhale 1 Inhaler into the lungs daily.  3  . carbidopa-levodopa (SINEMET IR) 10-100 MG per tablet Take 1 tablet by mouth in the morning and at bedtime.     . Dupilumab (DUPIXENT [place]) Inject into the skin. Every two weeks    . Evolocumab (REPATHA SURECLICK) 254 MG/ML SOAJ Inject into the skin every 14 (fourteen) days.    . finasteride (PROSCAR) 5 MG tablet Take 1 tablet (5 mg total) by mouth daily. 90 tablet 3  . folic acid (FOLVITE) 1 MG tablet Take 1 mg by mouth  daily.    . hydrochlorothiazide (HYDRODIURIL) 25 MG tablet Take 25 mg by mouth daily.    . isosorbide mononitrate (IMDUR) 30 MG 24 hr tablet TAKE 1 TABLET BY MOUTH  DAILY (Patient taking differently: Take 30 mg by mouth daily. ) 90 tablet 3  . metoprolol tartrate (LOPRESSOR) 25 MG tablet Take 0.5 tablets (12.5 mg total) by mouth daily. 30 tablet 2  . montelukast (SINGULAIR) 10 MG tablet Take 10 mg by mouth at bedtime.    . nitroGLYCERIN (NITROSTAT) 0.4 MG SL tablet Place 1 tablet (0.4 mg total) under the tongue every 5 (five) minutes x 3 doses as needed for chest pain (if no relief after 3rd dose, proceed to the ED for an evaluation or call 911). 90 tablet 3  . omeprazole  (PRILOSEC) 40 MG capsule Take 40 mg by mouth every morning.    Marland Kitchen oxymetazoline (AFRIN) 0.05 % nasal spray Place 1 spray into both nostrils at bedtime.    . polyethylene glycol (MIRALAX / GLYCOLAX) 17 g packet Take 17 g by mouth daily as needed for mild constipation. 14 each 0  . sulfaSALAzine (AZULFIDINE) 500 MG tablet Take 1,000 mg by mouth 2 (two) times daily.    . temazepam (RESTORIL) 30 MG capsule Take 30 mg by mouth at bedtime as needed for sleep.     . Vitamin D, Ergocalciferol, (DRISDOL) 50000 UNITS CAPS capsule Take 50,000 Units by mouth every 7 (seven) days. fridays    . potassium chloride (KLOR-CON) 10 MEQ tablet Take 1 tablet (10 mEq total) by mouth daily. 90 tablet 3   No current facility-administered medications for this visit.   Allergies:  Bee venom and Statins   ROS: No palpitations or syncope.  Physical Exam: VS:  BP 138/72   Pulse 62   Ht 5' 11"  (1.803 m)   Wt 212 lb (96.2 kg)   SpO2 93%   BMI 29.57 kg/m , BMI Body mass index is 29.57 kg/m.  Wt Readings from Last 3 Encounters:  07/12/20 212 lb (96.2 kg)  05/27/20 220 lb (99.8 kg)  04/16/20 220 lb (99.8 kg)    General: Patient appears comfortable at rest. HEENT: Conjunctiva and lids normal, wearing a mask. Neck: Supple, no elevated JVP or carotid bruits, no thyromegaly. Lungs: Clear to auscultation, nonlabored breathing at rest. Cardiac: Regular rate and rhythm, no S3 or significant systolic murmur, no pericardial rub. Extremities: Mild lower leg edema at sock line.  ECG:  An ECG dated 04/16/2020 was personally reviewed today and demonstrated:  Sinus rhythm with prolonged PR interval, nonspecific ST changes with lead motion artifact.  Recent Labwork: 04/16/2020: ALT 18; AST 20 04/20/2020: BUN <5; Creatinine, Ser 0.96; Hemoglobin 10.2; Magnesium 1.8; Platelets 240; Potassium 3.2; Sodium 138     Component Value Date/Time   CHOL 78 (L) 12/12/2019 0910   TRIG 82 12/12/2019 0910   HDL 42 12/12/2019 0910    CHOLHDL 1.9 12/12/2019 0910   CHOLHDL 3.6 10/23/2017 0857   VLDL 17 10/23/2017 0857   LDLCALC 19 12/12/2019 0910    Other Studies Reviewed Today:  Cardiac catheterization 01/02/2018:  Previously placed Dist RCA stent (unknown type) is widely patent.  Previously placed Ost RCA to Prox RCA stent (unknown type) is widely patent.  Prox RCA to Mid RCA lesion is 60% stenosed.  Mid RCA lesion is 70% stenosed.  Acute Mrg lesion is 95% stenosed.  Post Atrio lesion is 60% stenosed.  Ost RPDA lesion is 70% stenosed.  Previously placed Prox  Cx stent (unknown type) is widely patent.  Mid Cx to Dist Cx lesion is 60% stenosed.  Ost 3rd Mrg to 3rd Mrg lesion is 50% stenosed.  Ost 2nd Diag to 2nd Diag lesion is 70% stenosed.  Mid LAD lesion is 70% stenosed.  Dist LAD lesion is 30% stenosed.  Prox LAD lesion is 55% stenosed.  The left ventricular ejection fraction is greater than 65% by visual estimate.  There is hyperdynamic left ventricular systolic function.  LV end diastolic pressure is normal.  Hyperdynamic LV function with a "spade-like ventricle "with an EF of at least 75%.  Multivessel CAD with 55% proximal LAD stenosis after the first small first diagonal vessel, with diffuse 70% bifurcation stenoses involving the mid LAD and second diagonal vessel and 30% distal apical diffuse stenoses; patent left circumflex stent in the AV groove between the first small marginal branch and second large marginal vessel. There is diffuse 50 to 60% stenoses in the distal AV groove circumflex and bifurcating OM 3 vessel; large dominant RCA with patent proximal stent, diffuse 60 to 70% mid stenoses with 95% ostial stenosis and a small RV marginal branch and patent distal RCA stent proximal to the PDA takeoff with 60 to 70% stenoses at the origin of the PDA and continuation branch beyond the PDA vessel.  Echocardiogram 10/23/2017: Study Conclusions   - Left ventricle: The cavity size was  normal. Wall thickness was  increased increased in a pattern of mild to moderate LVH.  Systolic function was normal. The estimated ejection fraction was  in the range of 55% to 60%. Wall motion was normal; there were no  regional wall motion abnormalities. Doppler parameters are  consistent with abnormal left ventricular relaxation (grade 1  diastolic dysfunction).  - Aortic valve: Mildly calcified annulus. Trileaflet; mildly  thickened leaflets. Valve area (VTI): 2.54 cm^2. Valve area  (Vmax): 2.26 cm^2.  - Mitral valve: Mildly calcified annulus. Normal thickness leaflets  .  - Technically adequate study.   Assessment and Plan:  1.  Multivessel CAD status post previous DES intervention to the RCA and obtuse marginal, continuing medical therapy at this time in the absence of progressive angina.  Continue aspirin, Norvasc, Imdur, and Lopressor.  2.  Mixed hyperlipidemia with statin intolerance.  He is on Repatha with last LDL 19.  3.  Essential hypertension, blood pressure control reasonable today.  HCTZ was added since our last encounter, reports improvement in leg swelling and fluid status in addition to weight loss.  We will add KCl 10 mEq daily, check BMET and magnesium.  Also obtain follow-up echocardiogram to ensure no change in LVEF.  Medication Adjustments/Labs and Tests Ordered: Current medicines are reviewed at length with the patient today.  Concerns regarding medicines are outlined above.   Tests Ordered: Orders Placed This Encounter  Procedures  . Magnesium  . Basic metabolic panel  . ECHOCARDIOGRAM COMPLETE    Medication Changes: Meds ordered this encounter  Medications  . potassium chloride (KLOR-CON) 10 MEQ tablet    Sig: Take 1 tablet (10 mEq total) by mouth daily.    Dispense:  90 tablet    Refill:  3    07/12/2020 NEW    Disposition:  Follow up 6 months in the [place] office.  Signed, Satira Sark, MD, [facility] 07/12/2020 9:50 AM     [facility] [place], [place], [place] [address] Phone: [phone]; Fax: [phone]

## 2020-07-12 ENCOUNTER — Encounter: Payer: Self-pay | Admitting: Cardiology

## 2020-07-12 ENCOUNTER — Ambulatory Visit (INDEPENDENT_AMBULATORY_CARE_PROVIDER_SITE_OTHER): Payer: Medicare Other | Admitting: Cardiology

## 2020-07-12 ENCOUNTER — Other Ambulatory Visit: Payer: Self-pay | Admitting: *Deleted

## 2020-07-12 VITALS — BP 138/72 | HR 62 | Ht 71.0 in | Wt 212.0 lb

## 2020-07-12 DIAGNOSIS — I10 Essential (primary) hypertension: Secondary | ICD-10-CM

## 2020-07-12 DIAGNOSIS — Z79899 Other long term (current) drug therapy: Secondary | ICD-10-CM | POA: Diagnosis not present

## 2020-07-12 DIAGNOSIS — I200 Unstable angina: Secondary | ICD-10-CM | POA: Diagnosis not present

## 2020-07-12 DIAGNOSIS — E782 Mixed hyperlipidemia: Secondary | ICD-10-CM | POA: Diagnosis not present

## 2020-07-12 DIAGNOSIS — I25119 Atherosclerotic heart disease of native coronary artery with unspecified angina pectoris: Secondary | ICD-10-CM

## 2020-07-12 MED ORDER — POTASSIUM CHLORIDE ER 10 MEQ PO TBCR: 10.0000 meq | EXTENDED_RELEASE_TABLET | Freq: Every day | ORAL | 3 refills | Status: DC

## 2020-07-12 NOTE — Patient Instructions (Addendum)
Medication Instructions:   Your physician has recommended you make the following change in your medication:   Start potassium chloride 10 meq by mouth daily  Continue other medications the same  Labwork:  Your physician recommends that you return for lab work in: BMET & Mg  Testing/Procedures: Your physician has requested that you have an echocardiogram. Echocardiography is a painless test that uses sound waves to create images of your heart. It provides your doctor with information about the size and shape of your heart and how well your heart's chambers and valves are working. This procedure takes approximately one hour. There are no restrictions for this procedure.  Follow-Up:  Your physician recommends that you schedule a follow-up appointment in: 6 months.  Any Other Special Instructions Will Be Listed Below (If Applicable).  If you need a refill on your cardiac medications before your next appointment, please call your pharmacy.

## 2020-07-13 DIAGNOSIS — M5416 Radiculopathy, lumbar region: Secondary | ICD-10-CM | POA: Diagnosis not present

## 2020-07-13 DIAGNOSIS — M5412 Radiculopathy, cervical region: Secondary | ICD-10-CM | POA: Diagnosis not present

## 2020-07-19 DIAGNOSIS — Z79899 Other long term (current) drug therapy: Secondary | ICD-10-CM | POA: Diagnosis not present

## 2020-07-19 DIAGNOSIS — I25119 Atherosclerotic heart disease of native coronary artery with unspecified angina pectoris: Secondary | ICD-10-CM | POA: Diagnosis not present

## 2020-07-19 DIAGNOSIS — Z125 Encounter for screening for malignant neoplasm of prostate: Secondary | ICD-10-CM | POA: Diagnosis not present

## 2020-07-19 DIAGNOSIS — K219 Gastro-esophageal reflux disease without esophagitis: Secondary | ICD-10-CM | POA: Diagnosis not present

## 2020-07-19 DIAGNOSIS — R194 Change in bowel habit: Secondary | ICD-10-CM | POA: Diagnosis not present

## 2020-07-19 DIAGNOSIS — K760 Fatty (change of) liver, not elsewhere classified: Secondary | ICD-10-CM | POA: Diagnosis not present

## 2020-07-19 DIAGNOSIS — K5150 Left sided colitis without complications: Secondary | ICD-10-CM | POA: Diagnosis not present

## 2020-07-20 DIAGNOSIS — I10 Essential (primary) hypertension: Secondary | ICD-10-CM | POA: Diagnosis not present

## 2020-07-20 DIAGNOSIS — I2510 Atherosclerotic heart disease of native coronary artery without angina pectoris: Secondary | ICD-10-CM | POA: Diagnosis not present

## 2020-07-21 ENCOUNTER — Encounter: Payer: Self-pay | Admitting: *Deleted

## 2020-07-22 ENCOUNTER — Telehealth: Payer: Self-pay | Admitting: Internal Medicine

## 2020-07-22 DIAGNOSIS — Z23 Encounter for immunization: Secondary | ICD-10-CM | POA: Diagnosis not present

## 2020-07-22 NOTE — Telephone Encounter (Signed)
PA for repatha submitted via [place] (Key: [id number]) - [id number]

## 2020-07-26 DIAGNOSIS — K639 Disease of intestine, unspecified: Secondary | ICD-10-CM | POA: Diagnosis not present

## 2020-07-26 NOTE — Telephone Encounter (Signed)
Request Reference Number: [id number]. REPATHA SURE INJ 140MG/ML is approved through 08/20/2021

## 2020-08-07 ENCOUNTER — Other Ambulatory Visit: Payer: Self-pay | Admitting: Cardiology

## 2020-08-10 ENCOUNTER — Ambulatory Visit (INDEPENDENT_AMBULATORY_CARE_PROVIDER_SITE_OTHER): Payer: Medicare Other

## 2020-08-10 ENCOUNTER — Telehealth: Payer: Self-pay | Admitting: Cardiology

## 2020-08-10 DIAGNOSIS — I25119 Atherosclerotic heart disease of native coronary artery with unspecified angina pectoris: Secondary | ICD-10-CM

## 2020-08-10 LAB — ECHOCARDIOGRAM COMPLETE
Area-P 1/2: 2.68 cm2
Calc EF: 67.4 %
MV M vel: 2.2 m/s
MV Peak grad: 19.4 mmHg
S' Lateral: 2.84 cm
Single Plane A2C EF: 72.4 %
Single Plane A4C EF: 62.3 %

## 2020-08-10 NOTE — Telephone Encounter (Signed)
Please give pt a call concerning his metoprolol tartrate (LOPRESSOR) 25 MG tablet [[id number]]  He is not sleeping well and he takes medication to help him sleep, he's having weakness and dizziness.   Please call [phone]

## 2020-08-10 NOTE — Telephone Encounter (Signed)
Patient informed and verbalized understanding of plan. 

## 2020-08-10 NOTE — Telephone Encounter (Signed)
He is already on the lowest dose metoprolol 12.5 mg twice daily.  We can certainly stop it and see if he feels better, but this is taking away an antianginal medication, so he will need to keep an eye on symptoms.

## 2020-08-10 NOTE — Telephone Encounter (Signed)
Reports dizziness, weakness, and no energy since starting metoprolol 2 years ago. Says that having no energy is driving him crazy. 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 Advised the only way to determine if his symptoms would improve is to hold metoprolol. Advised that message would be sent to provider for recommendations

## 2020-08-11 ENCOUNTER — Telehealth: Payer: Self-pay | Admitting: *Deleted

## 2020-08-11 NOTE — Telephone Encounter (Signed)
-----   Message from Satira Sark, MD sent at 08/11/2020  8:12 AM EST ----- Results reviewed.  LVEF normal at 60 to 46%, mild diastolic dysfunction.  RV contraction is also normal.  No significant valvular abnormalities.  Continue with current medical therapy.

## 2020-08-11 NOTE — Telephone Encounter (Signed)
Patient informed. Copy sent to PCP °

## 2020-08-11 NOTE — Telephone Encounter (Signed)
Patient returning call to nurse

## 2020-08-16 DIAGNOSIS — G4733 Obstructive sleep apnea (adult) (pediatric): Secondary | ICD-10-CM | POA: Diagnosis not present

## 2020-08-16 DIAGNOSIS — L409 Psoriasis, unspecified: Secondary | ICD-10-CM | POA: Diagnosis not present

## 2020-08-16 DIAGNOSIS — J45909 Unspecified asthma, uncomplicated: Secondary | ICD-10-CM | POA: Diagnosis not present

## 2020-08-16 DIAGNOSIS — D869 Sarcoidosis, unspecified: Secondary | ICD-10-CM | POA: Diagnosis not present

## 2020-08-16 DIAGNOSIS — I2510 Atherosclerotic heart disease of native coronary artery without angina pectoris: Secondary | ICD-10-CM | POA: Diagnosis not present

## 2020-08-18 ENCOUNTER — Encounter: Payer: Self-pay | Admitting: Cardiology

## 2020-08-19 DIAGNOSIS — I10 Essential (primary) hypertension: Secondary | ICD-10-CM | POA: Diagnosis not present

## 2020-08-19 DIAGNOSIS — I2510 Atherosclerotic heart disease of native coronary artery without angina pectoris: Secondary | ICD-10-CM | POA: Diagnosis not present

## 2020-08-26 ENCOUNTER — Telehealth: Payer: Self-pay | Admitting: Internal Medicine

## 2020-08-26 NOTE — Telephone Encounter (Signed)
PA for repatha submitted via [facility] (Key: [id number])

## 2020-08-27 ENCOUNTER — Ambulatory Visit: Payer: Medicare Other | Admitting: Urology

## 2020-09-01 ENCOUNTER — Telehealth: Payer: Self-pay | Admitting: Cardiology

## 2020-09-01 NOTE — Telephone Encounter (Signed)
Per phone call from pt- states that his wife is currently on the vent with Covid, he has tested negative.   He is having a rapid heart rate around 115 that started yesterday evening when he went to see his wife. He became very short of breath while at the hospital, so the nurse took his BP top # was over 200 and his heart rate was around 104.  He does not have a BP cuff at home to check his BP so he's not sure what the reading is today.   States no dizziness, has had some tightness in his chest - and feels like his heart is beating like a Mining engineer"   Please call [phone]

## 2020-09-01 NOTE — Telephone Encounter (Signed)
Low-dose Lopressor 12.5 mg twice daily was stopped recently due to complaints of chronic fatigue.  He had actually been on it for a few years.  In light of the current situation and him showing elevations in heart rate and blood pressure, probably reasonable to resume this.

## 2020-09-01 NOTE — Telephone Encounter (Signed)
Pt c/o feeling "heart pounding" starting yesterday HR today was 115 and felt SOB lasting a few hours - today HR 115 nurse at hospital took SBP 200 HR 104 and was SOB for about 30 mins - went home and took Ativan (pcp started this yesterday to help with stress over wife having COVID and on vent) and says HR now is ranging between 47-77 on home monitor - does not have BP cuff to take BP at home - denies any symptoms at this time - pt tested negative for COVID yesterday - denies chest pain/dizziness/swelling - Metoprolol recently stopped per last phone note and pt wanting to know if he needed to restart this or take prn

## 2020-09-01 NOTE — Telephone Encounter (Signed)
Pt voiced understanding - updated medication list 

## 2020-09-03 ENCOUNTER — Telehealth: Payer: Self-pay

## 2020-09-03 NOTE — Telephone Encounter (Signed)
Pt called saying his wife is at [facility] with covid and failing kidneys. He wanted to know if Dr. Alyson Ingles could do any thing about getting her moved to another hospital. I told him we were very sorry but there was nothing the dr could do. It was up to the hospital whether they can take pts or not.

## 2020-09-08 ENCOUNTER — Ambulatory Visit: Payer: Medicare Other | Admitting: Urology

## 2020-09-08 DIAGNOSIS — N400 Enlarged prostate without lower urinary tract symptoms: Secondary | ICD-10-CM

## 2020-09-10 ENCOUNTER — Other Ambulatory Visit: Payer: Self-pay

## 2020-09-10 ENCOUNTER — Other Ambulatory Visit: Payer: Medicare Other

## 2020-09-10 ENCOUNTER — Telehealth: Payer: Self-pay

## 2020-09-10 DIAGNOSIS — R310 Gross hematuria: Secondary | ICD-10-CM

## 2020-09-10 LAB — URINALYSIS, ROUTINE W REFLEX MICROSCOPIC
Bilirubin, UA: NEGATIVE
Glucose, UA: NEGATIVE
Ketones, UA: NEGATIVE
Leukocytes,UA: NEGATIVE
Nitrite, UA: NEGATIVE
Protein,UA: NEGATIVE
Specific Gravity, UA: 1.01 (ref 1.005–1.030)
Urobilinogen, Ur: 0.2 mg/dL (ref 0.2–1.0)
pH, UA: 6 (ref 5.0–7.5)

## 2020-09-10 LAB — MICROSCOPIC EXAMINATION
Bacteria, UA: NONE SEEN
Epithelial Cells (non renal): NONE SEEN /hpf (ref 0–10)
RBC, Urine: >30 /hpf — AB (ref 0–2)
Renal Epithel, UA: NONE SEEN /hpf
WBC, UA: NONE SEEN /hpf (ref 0–5)

## 2020-09-10 MED ORDER — SULFAMETHOXAZOLE-TRIMETHOPRIM 800-160 MG PO TABS: 1.0000 | ORAL_TABLET | Freq: Two times a day (BID) | ORAL | 0 refills | Status: DC

## 2020-09-10 NOTE — Progress Notes (Signed)
Bactrim DS BID for 28 days per Dr. Alyson Ingles. Pt called and made aware.

## 2020-09-12 LAB — URINE CULTURE: Organism ID, Bacteria: NO GROWTH

## 2020-09-14 DIAGNOSIS — Z6829 Body mass index (BMI) 29.0-29.9, adult: Secondary | ICD-10-CM | POA: Diagnosis not present

## 2020-09-14 DIAGNOSIS — L281 Prurigo nodularis: Secondary | ICD-10-CM | POA: Diagnosis not present

## 2020-09-14 DIAGNOSIS — Z1331 Encounter for screening for depression: Secondary | ICD-10-CM | POA: Diagnosis not present

## 2020-09-14 DIAGNOSIS — Z0000 Encounter for general adult medical examination without abnormal findings: Secondary | ICD-10-CM | POA: Diagnosis not present

## 2020-09-14 DIAGNOSIS — L821 Other seborrheic keratosis: Secondary | ICD-10-CM | POA: Diagnosis not present

## 2020-09-14 DIAGNOSIS — Z299 Encounter for prophylactic measures, unspecified: Secondary | ICD-10-CM | POA: Diagnosis not present

## 2020-09-14 DIAGNOSIS — Z125 Encounter for screening for malignant neoplasm of prostate: Secondary | ICD-10-CM | POA: Diagnosis not present

## 2020-09-14 DIAGNOSIS — E538 Deficiency of other specified B group vitamins: Secondary | ICD-10-CM | POA: Diagnosis not present

## 2020-09-14 DIAGNOSIS — E7800 Pure hypercholesterolemia, unspecified: Secondary | ICD-10-CM | POA: Diagnosis not present

## 2020-09-14 DIAGNOSIS — L299 Pruritus, unspecified: Secondary | ICD-10-CM | POA: Diagnosis not present

## 2020-09-14 DIAGNOSIS — Z1339 Encounter for screening examination for other mental health and behavioral disorders: Secondary | ICD-10-CM | POA: Diagnosis not present

## 2020-09-14 DIAGNOSIS — I10 Essential (primary) hypertension: Secondary | ICD-10-CM | POA: Diagnosis not present

## 2020-09-14 DIAGNOSIS — Z79899 Other long term (current) drug therapy: Secondary | ICD-10-CM | POA: Diagnosis not present

## 2020-09-14 DIAGNOSIS — Z7189 Other specified counseling: Secondary | ICD-10-CM | POA: Diagnosis not present

## 2020-09-14 DIAGNOSIS — R5383 Other fatigue: Secondary | ICD-10-CM | POA: Diagnosis not present

## 2020-09-14 DIAGNOSIS — L209 Atopic dermatitis, unspecified: Secondary | ICD-10-CM | POA: Diagnosis not present

## 2020-09-14 NOTE — Telephone Encounter (Signed)
He needs to see for cystoscopy

## 2020-09-16 ENCOUNTER — Telehealth: Payer: Self-pay

## 2020-09-16 NOTE — Telephone Encounter (Signed)
Pt has appt with you 10/27/20. You are double and triple book until then. He was stared on bactrim ds bid for 28 days 09/10/20.

## 2020-09-16 NOTE — Telephone Encounter (Signed)
-----   Message from Cleon Gustin, MD sent at 09/13/2020 10:27 AM EST ----- Negative. I need to see him for cystoscopy ----- Message ----- From: Dorisann Frames, RN Sent: 09/13/2020  10:26 AM EST To: Cleon Gustin, MD  Please review

## 2020-09-16 NOTE — Telephone Encounter (Signed)
Message left for patient to return call.

## 2020-09-17 NOTE — Telephone Encounter (Signed)
Pt states his last ov you looked in his bladder- patient reports he ran out of his finasteride. Please advise. Reports he was still bleeding while on that medication. Asking if he should still have cysto.   Notified of culture

## 2020-09-22 ENCOUNTER — Other Ambulatory Visit: Payer: Self-pay

## 2020-09-22 ENCOUNTER — Ambulatory Visit (INDEPENDENT_AMBULATORY_CARE_PROVIDER_SITE_OTHER): Payer: Medicare Other | Admitting: Urology

## 2020-09-22 ENCOUNTER — Encounter: Payer: Self-pay | Admitting: Urology

## 2020-09-22 VITALS — BP 150/73 | HR 82 | Temp 98.5°F | Ht 72.0 in | Wt 215.0 lb

## 2020-09-22 DIAGNOSIS — R310 Gross hematuria: Secondary | ICD-10-CM | POA: Diagnosis not present

## 2020-09-22 LAB — URINALYSIS, ROUTINE W REFLEX MICROSCOPIC
Bilirubin, UA: NEGATIVE
Glucose, UA: NEGATIVE
Ketones, UA: NEGATIVE
Leukocytes,UA: NEGATIVE
Nitrite, UA: NEGATIVE
Protein,UA: NEGATIVE
RBC, UA: NEGATIVE
Specific Gravity, UA: 1.015 (ref 1.005–1.030)
Urobilinogen, Ur: 0.2 mg/dL (ref 0.2–1.0)
pH, UA: 5 (ref 5.0–7.5)

## 2020-09-22 MED ORDER — CIPROFLOXACIN HCL 500 MG PO TABS
500.0000 mg | ORAL_TABLET | Freq: Once | ORAL | Status: AC
  Administered 2020-09-22: 500 mg via ORAL

## 2020-09-22 MED ORDER — FINASTERIDE 5 MG PO TABS: 5.0000 mg | ORAL_TABLET | Freq: Every day | ORAL | 3 refills | Status: DC

## 2020-09-22 NOTE — Progress Notes (Signed)
   09/22/20  CC: Gross hematuria  HPI: Richard Webb is a 75yo here for followup for gross ehamturia.  Blood pressure (!) 150/73, pulse 82, temperature 98.5 F (36.9 C), height 6' (1.829 m), weight 215 lb (97.5 kg). NED. A&Ox3.   No respiratory distress   Abd soft, NT, ND Normal phallus with bilateral descended testicles  Cystoscopy Procedure Note  Patient identification was confirmed, informed consent was obtained, and patient was prepped using Betadine solution.  Lidocaine jelly was administered per urethral meatus.     Pre-Procedure: - Inspection reveals a normal caliber ureteral meatus.  Procedure: The flexible cystoscope was introduced without difficulty - No urethral strictures/lesions are present. 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  PNicolette Bang MD

## 2020-09-22 NOTE — Patient Instructions (Signed)

## 2020-09-22 NOTE — Progress Notes (Signed)
Urological Symptom Review  Patient is experiencing the following symptoms: Blood in urine   Review of Systems  Gastrointestinal (upper)  : Negative for upper GI symptoms  Gastrointestinal (lower) : Negative for lower GI symptoms  Constitutional : Negative for symptoms  Skin: Negative for skin symptoms  Eyes: Negative for eye symptoms  Ear/Nose/Throat : Negative for Ear/Nose/Throat symptoms  Hematologic/Lymphatic: Negative for Hematologic/Lymphatic symptoms  Cardiovascular : Negative for cardiovascular symptoms  Respiratory : Negative for respiratory symptoms  Endocrine: Negative for endocrine symptoms  Musculoskeletal: Negative for musculoskeletal symptoms  Neurological: Negative for neurological symptoms  Psychologic: Negative for psychiatric symptoms

## 2020-09-27 ENCOUNTER — Ambulatory Visit: Payer: Self-pay | Admitting: General Surgery

## 2020-09-27 DIAGNOSIS — K5732 Diverticulitis of large intestine without perforation or abscess without bleeding: Secondary | ICD-10-CM | POA: Diagnosis not present

## 2020-09-28 ENCOUNTER — Telehealth: Payer: Self-pay | Admitting: Cardiology

## 2020-09-28 ENCOUNTER — Ambulatory Visit: Payer: Self-pay | Admitting: General Surgery

## 2020-09-28 NOTE — Telephone Encounter (Signed)
   Primary Cardiologist: Rozann Lesches, MD  Chart reviewed as part of pre-operative protocol coverage. Patient was contacted 09/28/2020 in reference to pre-operative risk assessment for pending surgery as outlined below.  Dorrance Sellick was last seen on 07/12/20 by Dr. Domenic Polite.  Since that day, Verna Hamon has done well. He is able to complete more than 4.0 METS without angina. Of note, his wife of nearly 46 years passed away 2 weeks ago. He denies angina or symptoms of volume overload.  Therefore, based on ACC/AHA guidelines, the patient would be at acceptable risk for the planned procedure without further cardiovascular testing.   The patient was advised that if he develops new symptoms prior to surgery to contact our office to arrange for a follow-up visit, and he verbalized understanding.  I will route this recommendation to the requesting party via Epic fax function and remove from pre-op pool. Please call with questions.  Tami Lin Lillan Mccreadie, PA 09/28/2020, 11:51 AM

## 2020-09-28 NOTE — Telephone Encounter (Signed)
   [facility] Pre-operative Risk Assessment    HEARTCARE STAFF: - Please ensure there is not already an duplicate clearance open for this procedure. - Under Visit Info/Reason for Call, type in Other and utilize the format Clearance MM/DD/YY or Clearance TBD. Do not use dashes or single digits. - If request is for dental extraction, please clarify the # of teeth to be extracted.  Request for surgical clearance:  1. What type of surgery is being performed? Partial Colectomy  2. When is this surgery scheduled? ASAP   3. What type of clearance is required (medical clearance vs. Pharmacy clearance to hold med vs. Both)? Medical Clearance  4. Are there any medications that need to be held prior to surgery and how long? None listed  5. Practice name and name of physician performing surgery? [facility], Dr. Leighton Ruff  6. What is the office phone number? [phone]   7.   What is the office fax number? [phone] 8.   Anesthesia type (None, local, MAC, general) ? General   Richard Webb 09/28/2020, 10:23 AM  _________________________________________________________________   (provider comments below)

## 2020-10-01 ENCOUNTER — Other Ambulatory Visit: Payer: Self-pay | Admitting: Urology

## 2020-10-15 DIAGNOSIS — M5412 Radiculopathy, cervical region: Secondary | ICD-10-CM | POA: Diagnosis not present

## 2020-10-15 DIAGNOSIS — M5416 Radiculopathy, lumbar region: Secondary | ICD-10-CM | POA: Diagnosis not present

## 2020-10-15 NOTE — Patient Instructions (Addendum)
DUE TO COVID-19 ONLY ONE VISITOR IS ALLOWED TO COME WITH YOU AND STAY IN THE WAITING ROOM ONLY DURING PRE OP AND PROCEDURE DAY OF SURGERY. THE 1 VISITOR  MAY VISIT WITH YOU AFTER SURGERY IN YOUR PRIVATE ROOM DURING VISITING HOURS ONLY!  YOU NEED TO HAVE A COVID 19 TEST ON__3/8_____ @_______ , THIS TEST MUST BE DONE BEFORE SURGERY,  COVID TESTING SITE [address] [place] [address], IT IS ON THE RIGHT GOING OUT [address] APPROXIMATELY  2 MINUTES PAST [employer] ON THE RIGHT. ONCE YOUR COVID TEST IS COMPLETED,  PLEASE BEGIN THE QUARANTINE INSTRUCTIONS AS OUTLINED IN YOUR HANDOUT.                Richard Webb    Your procedure is scheduled on: 10/29/20   Report to [facility] Main  Entrance   Report to admitting at 9:00 AM     Call this number if you have problems the morning of surgery [place], NO CHEWING GUM [place].  Follow all instructions for the bowel prep from Dr. Manon Hilding office.  Drink plenty of fluid the day of prep to prevent dehydration.  DRINK 2 PRESURGERY ENSURE DRINKS THE NIGHT BEFORE SURGERY AT 10:00 PM.  . NO SOLIDS AFTER MIDNIGHT THE DAY PRIOR TO THE SURGERY.   NOTHING BY MOUTH EXCEPT CLEAR LIQUIDS UNTIL 8:00 AM   PLEASE FINISH PRESURGERY ENSURE DRINK PER SURGEON  By 8:00 AM_.     Take these medicines the morning of surgery with A SIP OF WATER: Imdur, Sinemet, Metoprolol, Amlodipine, Finasteride, Omeprazole  Use your inhalers and bring them with you to the hospital.  Bring your mask and tubing to the hospital                                  You may not have any metal on your body including               piercings  Do not wear jewelry,  lotions, powders or deodorant                       Men may shave face and neck.   Do not bring valuables to the hospital. [place].  Contacts, dentures or  bridgework may not be worn into surgery.                 [place] - Preparing for Surgery Before surgery, you can play an important role.  Because skin is not sterile, your skin needs to be as free of germs as possible.  You can reduce the number of germs on your skin by washing with CHG (chlorahexidine gluconate) soap before surgery.  CHG is an antiseptic cleaner which kills germs and bonds with the skin to continue killing germs even after washing. Please DO NOT use if you have an allergy to CHG or antibacterial soaps.  If your skin becomes reddened/irritated stop using the CHG and inform your nurse when you arrive at Short Stay. .  You may shave your face/neck.  Please follow these instructions carefully:  1.  Shower with CHG Soap the night before surgery and the  morning of Surgery.  2.  If you choose to wash your hair, wash your hair first as usual with your  normal  shampoo.  3.  After you shampoo, rinse your hair and body thoroughly to remove the  shampoo.                                        4.  Use CHG as you would any other liquid soap.  You can apply chg directly  to the skin and wash                       Gently with a scrungie or clean washcloth.  5.  Apply the CHG Soap to your body ONLY FROM THE NECK DOWN.   Do not use on face/ open                           Wound or open sores. Avoid contact with eyes, ears mouth and genitals (private parts).                       Wash face,  Genitals (private parts) with your normal soap.             6.  Wash thoroughly, paying special attention to the area where your surgery  will be performed.  7.  Thoroughly rinse your body with warm water from the neck down.  8.  DO NOT shower/wash with your normal soap after using and rinsing off  the CHG Soap.             9.  Pat yourself dry with a clean towel.            10.  Wear clean pajamas.            11.  Place clean sheets on your bed the night of your first shower and do not  sleep with  pets. Day of Surgery : Do not apply any lotions/deodorants the morning of surgery.  Please wear clean clothes to the hospital/surgery center.  FAILURE TO FOLLOW THESE INSTRUCTIONS MAY RESULT IN THE CANCELLATION OF YOUR SURGERY PATIENT SIGNATURE_________________________________  NURSE SIGNATURE__________________________________  ________________________________________________________________________   Richard Webb  An incentive spirometer is a tool that can help keep your lungs clear and active. This tool measures how well you are filling your lungs with each breath. Taking long deep breaths may help reverse or decrease the chance of developing breathing (pulmonary) problems (especially infection) following:  A long period of time when you are unable to move or be active. BEFORE THE PROCEDURE   If the spirometer includes an indicator to show your best effort, your nurse or respiratory therapist will set it to a desired goal.  If possible, sit up straight or lean slightly forward. Try not to slouch.  Hold the incentive spirometer in an upright position. INSTRUCTIONS FOR USE  1. Sit on the edge of your bed if possible, or sit up as far as you can in bed or on a chair. 2. Hold the incentive spirometer in an upright position. 3. Breathe out normally. 4. Place the mouthpiece in your mouth and seal your lips tightly around it. 5. Breathe in slowly and as deeply as possible, raising the piston or the ball toward the top of the column. 6. Hold your breath for 3-5 seconds or for  as long as possible. Allow the piston or ball to fall to the bottom of the column. 7. Remove the mouthpiece from your mouth and breathe out normally. 8. Rest for a few seconds and repeat Steps 1 through 7 at least 10 times every 1-2 hours when you are awake. Take your time and take a few normal breaths between deep breaths. 9. The spirometer may include an indicator to show your best effort. Use the indicator  as a goal to work toward during each repetition. 10. After each set of 10 deep breaths, practice coughing to be sure your lungs are clear. If you have an incision (the cut made at the time of surgery), support your incision when coughing by placing a pillow or rolled up towels firmly against it. Once you are able to get out of bed, walk around indoors and cough well. You may stop using the incentive spirometer when instructed by your caregiver.  RISKS AND COMPLICATIONS  Take your time so you do not get dizzy or light-headed.  If you are in pain, you may need to take or ask for pain medication before doing incentive spirometry. It is harder to take a deep breath if you are having pain. AFTER USE  Rest and breathe slowly and easily.  It can be helpful to keep track of a log of your progress. Your caregiver can provide you with a simple table to help with this. If you are using the spirometer at home, follow these instructions: [place] IF:   You are having difficultly using the spirometer.  You have trouble using the spirometer as often as instructed.  Your pain medication is not giving enough relief while using the spirometer.  You develop fever of 100.5 F (38.1 C) or higher. SEEK IMMEDIATE MEDICAL CARE IF:   You cough up bloody sputum that had not been present before.  You develop fever of 102 F (38.9 C) or greater.  You develop worsening pain at or near the incision site. MAKE SURE YOU:   Understand these instructions.  Will watch your condition.  Will get help right away if you are not doing well or get worse. Document Released: 12/18/2006 Document Revised: 10/30/2011 Document Reviewed: 02/18/2007 [facility] Patient Information 2014 [place], [place].   ________________________________________________________________________

## 2020-10-16 ENCOUNTER — Encounter (HOSPITAL_COMMUNITY): Payer: Self-pay

## 2020-10-16 ENCOUNTER — Other Ambulatory Visit: Payer: Self-pay

## 2020-10-16 ENCOUNTER — Inpatient Hospital Stay (HOSPITAL_COMMUNITY)
Admission: EM | Admit: 2020-10-16 | Discharge: 2020-10-21 | DRG: 246 | Disposition: A | Discharge status: Home / Self Care | Payer: Medicare Other | Attending: Internal Medicine | Admitting: Internal Medicine

## 2020-10-16 ENCOUNTER — Emergency Department (HOSPITAL_COMMUNITY): Payer: Medicare Other

## 2020-10-16 DIAGNOSIS — I214 Non-ST elevation (NSTEMI) myocardial infarction: Principal | ICD-10-CM | POA: Diagnosis present

## 2020-10-16 DIAGNOSIS — E876 Hypokalemia: Secondary | ICD-10-CM | POA: Diagnosis not present

## 2020-10-16 DIAGNOSIS — G2581 Restless legs syndrome: Secondary | ICD-10-CM | POA: Diagnosis present

## 2020-10-16 DIAGNOSIS — Z87891 Personal history of nicotine dependence: Secondary | ICD-10-CM

## 2020-10-16 DIAGNOSIS — I2693 Single subsegmental pulmonary embolism without acute cor pulmonale: Secondary | ICD-10-CM | POA: Diagnosis present

## 2020-10-16 DIAGNOSIS — R319 Hematuria, unspecified: Secondary | ICD-10-CM | POA: Diagnosis present

## 2020-10-16 DIAGNOSIS — K5792 Diverticulitis of intestine, part unspecified, without perforation or abscess without bleeding: Secondary | ICD-10-CM | POA: Diagnosis present

## 2020-10-16 DIAGNOSIS — Z7982 Long term (current) use of aspirin: Secondary | ICD-10-CM

## 2020-10-16 DIAGNOSIS — R7989 Other specified abnormal findings of blood chemistry: Secondary | ICD-10-CM

## 2020-10-16 DIAGNOSIS — Z9049 Acquired absence of other specified parts of digestive tract: Secondary | ICD-10-CM

## 2020-10-16 DIAGNOSIS — I2699 Other pulmonary embolism without acute cor pulmonale: Secondary | ICD-10-CM

## 2020-10-16 DIAGNOSIS — D860 Sarcoidosis of lung: Secondary | ICD-10-CM | POA: Diagnosis present

## 2020-10-16 DIAGNOSIS — K219 Gastro-esophageal reflux disease without esophagitis: Secondary | ICD-10-CM

## 2020-10-16 DIAGNOSIS — I2510 Atherosclerotic heart disease of native coronary artery without angina pectoris: Secondary | ICD-10-CM

## 2020-10-16 DIAGNOSIS — Z20822 Contact with and (suspected) exposure to covid-19: Secondary | ICD-10-CM | POA: Diagnosis not present

## 2020-10-16 DIAGNOSIS — D72828 Other elevated white blood cell count: Secondary | ICD-10-CM | POA: Diagnosis present

## 2020-10-16 DIAGNOSIS — R079 Chest pain, unspecified: Secondary | ICD-10-CM | POA: Diagnosis not present

## 2020-10-16 DIAGNOSIS — I10 Essential (primary) hypertension: Secondary | ICD-10-CM

## 2020-10-16 DIAGNOSIS — I252 Old myocardial infarction: Secondary | ICD-10-CM

## 2020-10-16 DIAGNOSIS — I472 Ventricular tachycardia: Secondary | ICD-10-CM | POA: Diagnosis not present

## 2020-10-16 DIAGNOSIS — Z8719 Personal history of other diseases of the digestive system: Secondary | ICD-10-CM

## 2020-10-16 DIAGNOSIS — R778 Other specified abnormalities of plasma proteins: Secondary | ICD-10-CM

## 2020-10-16 DIAGNOSIS — E782 Mixed hyperlipidemia: Secondary | ICD-10-CM | POA: Diagnosis present

## 2020-10-16 DIAGNOSIS — R739 Hyperglycemia, unspecified: Secondary | ICD-10-CM

## 2020-10-16 DIAGNOSIS — R0789 Other chest pain: Secondary | ICD-10-CM | POA: Diagnosis not present

## 2020-10-16 DIAGNOSIS — Z96653 Presence of artificial knee joint, bilateral: Secondary | ICD-10-CM | POA: Diagnosis present

## 2020-10-16 DIAGNOSIS — I440 Atrioventricular block, first degree: Secondary | ICD-10-CM | POA: Diagnosis present

## 2020-10-16 DIAGNOSIS — J45909 Unspecified asthma, uncomplicated: Secondary | ICD-10-CM

## 2020-10-16 DIAGNOSIS — R040 Epistaxis: Secondary | ICD-10-CM | POA: Diagnosis not present

## 2020-10-16 DIAGNOSIS — Z79899 Other long term (current) drug therapy: Secondary | ICD-10-CM

## 2020-10-16 DIAGNOSIS — I509 Heart failure, unspecified: Secondary | ICD-10-CM | POA: Diagnosis not present

## 2020-10-16 DIAGNOSIS — G4733 Obstructive sleep apnea (adult) (pediatric): Secondary | ICD-10-CM | POA: Diagnosis present

## 2020-10-16 DIAGNOSIS — Z7951 Long term (current) use of inhaled steroids: Secondary | ICD-10-CM

## 2020-10-16 DIAGNOSIS — Z955 Presence of coronary angioplasty implant and graft: Secondary | ICD-10-CM

## 2020-10-16 DIAGNOSIS — N400 Enlarged prostate without lower urinary tract symptoms: Secondary | ICD-10-CM

## 2020-10-16 DIAGNOSIS — D72829 Elevated white blood cell count, unspecified: Secondary | ICD-10-CM

## 2020-10-16 DIAGNOSIS — N401 Enlarged prostate with lower urinary tract symptoms: Secondary | ICD-10-CM | POA: Diagnosis present

## 2020-10-16 DIAGNOSIS — D869 Sarcoidosis, unspecified: Secondary | ICD-10-CM

## 2020-10-16 LAB — COMPREHENSIVE METABOLIC PANEL
ALT: 12 U/L (ref 0–44)
AST: 26 U/L (ref 15–41)
Albumin: 4 g/dL (ref 3.5–5.0)
Alkaline Phosphatase: 120 U/L (ref 38–126)
Anion gap: 13 (ref 5–15)
BUN: 17 mg/dL (ref 8–23)
CO2: 21 mmol/L — ABNORMAL LOW (ref 22–32)
Calcium: 9 mg/dL (ref 8.9–10.3)
Chloride: 99 mmol/L (ref 98–111)
Creatinine, Ser: 1.07 mg/dL (ref 0.61–1.24)
GFR, Estimated: >60 mL/min (ref >60)
Glucose, Bld: 306 mg/dL — ABNORMAL HIGH (ref 70–99)
Potassium: 4 mmol/L (ref 3.5–5.1)
Sodium: 133 mmol/L — ABNORMAL LOW (ref 135–145)
Total Bilirubin: 0.8 mg/dL (ref 0.3–1.2)
Total Protein: 7.8 g/dL (ref 6.5–8.1)

## 2020-10-16 LAB — CBC
HCT: 39.8 % (ref 39.0–52.0)
Hemoglobin: 12.9 g/dL — ABNORMAL LOW (ref 13.0–17.0)
MCH: 28 pg (ref 26.0–34.0)
MCHC: 32.4 g/dL (ref 30.0–36.0)
MCV: 86.5 fL (ref 80.0–100.0)
Platelets: 311 10*3/uL (ref 150–400)
RBC: 4.6 MIL/uL (ref 4.22–5.81)
RDW: 15.3 % (ref 11.5–15.5)
WBC: 11.3 10*3/uL — ABNORMAL HIGH (ref 4.0–10.5)
nRBC: 0 % (ref 0.0–0.2)

## 2020-10-16 LAB — LIPASE, BLOOD: Lipase: 37 U/L (ref 11–51)

## 2020-10-16 LAB — TROPONIN I (HIGH SENSITIVITY): Troponin I (High Sensitivity): 8 ng/L (ref <18)

## 2020-10-16 MED ORDER — ASPIRIN 81 MG PO CHEW
324.0000 mg | CHEWABLE_TABLET | Freq: Once | ORAL | Status: AC
  Administered 2020-10-16: 324 mg via ORAL
  Filled 2020-10-16: qty 4

## 2020-10-16 NOTE — ED Provider Notes (Signed)
11:28 PM Assumed care from Dr. Sabra Heck, please see their note for full history, physical and decision making until this point. In brief this is a 75 y.o. year old male who presented to the ED tonight with Chest Pain     Prior CAD here with what sounds like abdominal symptoms. Pain free here. Very atypical sounding, ecg and trop reassuring, pending 2nd troponin and follow up on symptoms. Likely discharge.   Patient chest pain-free during the whole of my care however troponin went from 04[birth year]18-65.  Although these do not high the trend worries me so we will bring him in for ACS rule out.  Discussed the hospitalist.  Stable for admission at this time.  Labs, studies and imaging reviewed by myself and considered in medical decision making if ordered. Imaging interpreted by radiology.  Labs Reviewed  COMPREHENSIVE METABOLIC PANEL - Abnormal; Notable for the following components:      Result Value   Sodium 133 (*)    CO2 21 (*)    Glucose, Bld 306 (*)    All other components within normal limits  CBC - Abnormal; Notable for the following components:   WBC 11.3 (*)    Hemoglobin 12.9 (*)    All other components within normal limits  CBC - Abnormal; Notable for the following components:   WBC 14.4 (*)    Hemoglobin 12.9 (*)    All other components within normal limits  COMPREHENSIVE METABOLIC PANEL - Abnormal; Notable for the following components:   CO2 21 (*)    Glucose, Bld 189 (*)    All other components within normal limits  TROPONIN I (HIGH SENSITIVITY) - Abnormal; Notable for the following components:   Troponin I (High Sensitivity) 29 (*)    All other components within normal limits  TROPONIN I (HIGH SENSITIVITY) - Abnormal; Notable for the following components:   Troponin I (High Sensitivity) 66 (*)    All other components within normal limits  SARS CORONAVIRUS 2 (TAT 6-24 HRS)  LIPASE, BLOOD  MAGNESIUM  PHOSPHORUS  PROTIME-INR  APTT  HEMOGLOBIN A1C  TROPONIN I (HIGH SENSITIVITY)   TROPONIN I (HIGH SENSITIVITY)  TROPONIN I (HIGH SENSITIVITY)    DG Chest Port 1 View  Final Result      No follow-ups on file.    Katrice Goel, Corene Cornea, MD 10/17/20 [phone]

## 2020-10-16 NOTE — ED Triage Notes (Signed)
Pt arrives from home via POV c/o chest pain that began a few hours ago. Pt reports taking all regular rpescribed medication today and not missing any doses. Pt reports CP is sternal, feeling bloated. Pt denies radiation to left arm, back jaw. Pt denies nausea, dizziness, or feeling lightheaded.

## 2020-10-16 NOTE — ED Provider Notes (Signed)
[facility] EMERGENCY DEPARTMENT Provider Note   CSN: [id number] Arrival date & time: 10/16/20  2201     History Chief Complaint  Patient presents with  . Chest Pain    Richard Webb is a 75 y.o. male.  HPI   This patient is a 75 year old male, he has a known history of coronary disease with 4 stents, he has had acid reflux disease hyperlipidemia, pulmonary hypertension, sarcoidosis and ulcerative colitis with a history of diverticulitis.  He tells me that he is pending possible surgical intervention for the diverticulitis as an outpatient electively.  The patient reports that tonight after visiting with some friends and having a subsandwich he went home and started to develop chest pain.  He reports that it was kind of in the substernal area down into the epigastrium, felt like his abdomen was bloated and distended and felt some discomfort in the left shoulder.  There was no shortness of breath, no diaphoresis, he felt a little flushed in the face and took his heart rate at home which was 110 bpm.  Since that time his chest pain is gone away, he still has a slight discomfort in the left shoulder and has a bloated feeling in his abdomen.  He denies any swelling of the legs.  He states that he is compliant with his medications including his diuretic.  The patient has not had any coughing, no fevers, no diarrhea.  The symptoms have improved spontaneously, he does take a baby aspirin daily  Past Medical History:  Diagnosis Date  . Arthritis   . Asthma   . Coronary atherosclerosis of native coronary artery    a. DES x 3 RCA and DES mCx 10/11 - [place] - with residual 50-70% stenosis in the distal Cx in AV groove, 70% distal LAD beyond apex, 70% small D1. b. 12/2017: similar results with multivessel CAD. Patent stents. Medical management recommended.   Marland Kitchen GERD (gastroesophageal reflux disease)   . History of kidney stones   . Mixed hyperlipidemia   . Obstructive sleep apnea   .  Pulmonary hypertension (HCC)    PASP 50 mmHg  . Restless leg syndrome   . Sarcoidosis   . Statin intolerance   . Ulcerative colitis [facility])     Patient Active Problem List   Diagnosis Date Noted  . Diverticulitis of large intestine with abscess 04/17/2020  . Acute diverticulitis 04/17/2020  . Benign prostatic hyperplasia 03/18/2020  . Gross hematuria 03/18/2020  . OAB (overactive bladder) 03/18/2020  . Dyspnea on exertion 07/21/2019  . Accelerating angina ([place])   . Coronary atherosclerosis of native coronary artery 10/10/2012  . Mixed hyperlipidemia 10/10/2012    Past Surgical History:  Procedure Laterality Date  . Ankle fracture repair Left 1985  . CARDIAC CATHETERIZATION     stents x4  . CHOLECYSTECTOMY  1970  . CYSTOSCOPY WITH INSERTION OF UROLIFT N/A 08/27/2017   Procedure: CYSTOSCOPY WITH INSERTION OF UROLIFT;  Surgeon: Cleon Gustin, MD;  Location: [facility];  Service: Urology;  Laterality: N/A;  . LAPAROSCOPIC APPENDECTOMY  2013  . LEFT HEART CATH AND CORONARY ANGIOGRAPHY N/A 01/02/2018   Procedure: LEFT HEART CATH AND CORONARY ANGIOGRAPHY;  Surgeon: Troy Sine, MD;  Location: [facility];  Service: Cardiovascular;  Laterality: N/A;  . Right total knee replacement Bilateral 2005       Family History  Problem Relation Age of Onset  . Colon cancer Mother   . CAD Father     Social History  Tobacco Use  . Smoking status: Never Smoker  . Smokeless tobacco: Never Used  Vaping Use  . Vaping Use: Never used  Substance Use Topics  . Alcohol use: No  . Drug use: No    Home Medications Prior to Admission medications   Medication Sig Start Date End Date Taking? Authorizing Provider  amLODipine (NORVASC) 10 MG tablet Take 10 mg by mouth daily. 09/21/20   [provider]  amLODipine (NORVASC) 2.5 MG tablet Take 1 tablet (2.5 mg total) by mouth daily. 04/20/20   Georgette Shell, MD  aspirin EC 81 MG tablet Take 81 mg by mouth daily.     [provider]  BREO ELLIPTA 200-25 MCG/INH AEPB Inhale 1 Inhaler into the lungs daily. 12/25/17   [provider]  carbidopa-levodopa (SINEMET IR) 10-100 MG per tablet Take 1 tablet by mouth every evening.    [provider]  cyanocobalamin (,VITAMIN B-12,) 1000 MCG/ML injection Inject 1,000 mcg into the muscle every 30 (thirty) days. 08/10/20   [provider]  Dupilumab 300 MG/2ML SOPN Inject 300 mg into the skin every 14 (fourteen) days.    [provider]  Evolocumab (REPATHA SURECLICK) 371 MG/ML SOAJ Inject 140 mg into the skin every 14 (fourteen) days.    [provider]  finasteride (PROSCAR) 5 MG tablet Take 1 tablet (5 mg total) by mouth daily. 09/22/20   McKenzie, Candee Furbish, MD  folic acid (FOLVITE) 1 MG tablet Take 1 mg by mouth See admin instructions. Mon - Fri    [provider]  hydrochlorothiazide (HYDRODIURIL) 25 MG tablet Take 25 mg by mouth daily.    [provider]  HYDROcodone-acetaminophen (NORCO/VICODIN) 5-325 MG tablet Take 1 tablet by mouth every 6 (six) hours as needed for severe pain. 08/06/20   [provider]  isosorbide mononitrate (IMDUR) 30 MG 24 hr tablet TAKE 1 TABLET BY MOUTH  DAILY Patient taking differently: Take 30 mg by mouth daily. 02/24/20   Satira Sark, MD  LORazepam (ATIVAN) 0.5 MG tablet Take 0.5 mg by mouth daily as needed for anxiety. 09/10/20   [provider]  losartan (COZAAR) 50 MG tablet Take 50 mg by mouth daily. 08/03/20   [provider]  metoprolol tartrate (LOPRESSOR) 25 MG tablet Take 12.5 mg by mouth 2 (two) times daily.    [provider]  montelukast (SINGULAIR) 10 MG tablet Take 10 mg by mouth at bedtime.    [provider]  nitroGLYCERIN (NITROSTAT) 0.4 MG SL tablet Place 1 tablet (0.4 mg total) under the tongue every 5 (five) minutes x 3 doses as needed for chest pain (if no relief after 3rd dose, proceed to the ED for an  evaluation or call 911). 06/26/19   Satira Sark, MD  omeprazole (PRILOSEC) 20 MG capsule Take 20 mg by mouth daily as needed (acid reflux). 07/13/20   [provider]  omeprazole (PRILOSEC) 40 MG capsule Take 40 mg by mouth daily as needed (acid reflux). 02/04/20   [provider]  oxymetazoline (AFRIN) 0.05 % nasal spray Place 1 spray into both nostrils at bedtime as needed for congestion.    [provider]  polyethylene glycol (MIRALAX / GLYCOLAX) 17 g packet Take 17 g by mouth daily as needed for mild constipation. Patient not taking: No sig reported 04/20/20   Georgette Shell, MD  potassium chloride (KLOR-CON) 10 MEQ tablet Take 1 tablet (10 mEq total) by mouth daily. 07/12/20 10/10/20  Domenic Polite,  Aloha Gell, MD  sulfaSALAzine (AZULFIDINE) 500 MG tablet Take 1,000 mg by mouth 2 (two) times daily.    [provider]  temazepam (RESTORIL) 30 MG capsule Take 30 mg by mouth at bedtime as needed for sleep.     [provider]  Vitamin D, Ergocalciferol, (DRISDOL) 50000 UNITS CAPS capsule Take 50,000 Units by mouth every 7 (seven) days. Thursdays    [provider]    Allergies    Bee venom and Statins  Review of Systems   Review of Systems  All other systems reviewed and are negative.   Physical Exam Updated Vital Signs BP (!) 167/81 (BP Location: Left Arm)   Pulse 98   Temp 97.9 F (36.6 C) (Oral)   Resp 20   Ht 1.829 m (6')   Wt 97.5 kg   SpO2 100%   BMI 29.15 kg/m   Physical Exam Vitals and nursing note reviewed.  Constitutional:      General: He is not in acute distress.    Appearance: He is well-developed and well-nourished.  HENT:     Head: Normocephalic and atraumatic.     Mouth/Throat:     Mouth: Oropharynx is clear and moist.     Pharynx: No oropharyngeal exudate.  Eyes:     General: No scleral icterus.       Right eye: No discharge.        Left eye: No discharge.     Extraocular Movements: EOM normal.      Conjunctiva/sclera: Conjunctivae normal.     Pupils: Pupils are equal, round, and reactive to light.  Neck:     Thyroid: No thyromegaly.     Vascular: No JVD.  Cardiovascular:     Rate and Rhythm: Normal rate and regular rhythm.     Pulses: Intact distal pulses.     Heart sounds: Normal heart sounds. No murmur heard. No friction rub. No gallop.   Pulmonary:     Effort: Pulmonary effort is normal. No respiratory distress.     Breath sounds: Normal breath sounds. No wheezing or rales.  Abdominal:     General: Bowel sounds are normal. There is no distension.     Palpations: Abdomen is soft. There is no mass.     Tenderness: There is no abdominal tenderness.     Comments: The patient is nontender except for some minimal discomfort in the upper abdomen in the left upper quadrant, there is no epigastric or right upper quadrant tenderness, no Murphy sign, no lower abdominal tenderness.  The abdomen percusses normally, there is normal bowel sounds  Musculoskeletal:        General: No tenderness or edema. Normal range of motion.     Cervical back: Normal range of motion and neck supple.  Lymphadenopathy:     Cervical: No cervical adenopathy.  Skin:    General: Skin is warm and dry.     Findings: No erythema or rash.  Neurological:     Mental Status: He is alert.     Coordination: Coordination normal.  Psychiatric:        Mood and Affect: Mood and affect normal.        Behavior: Behavior normal.     ED Results / Procedures / Treatments   Labs (all labs ordered are listed, but only abnormal results are displayed) Labs Reviewed  COMPREHENSIVE METABOLIC PANEL  LIPASE, BLOOD  CBC  TROPONIN I (HIGH SENSITIVITY)    EKG EKG Interpretation  Date/Time:  Saturday  October 16 2020 22:09:52 EST Ventricular Rate:  98 PR Interval:    QRS Duration: 95 QT Interval:  365 QTC Calculation: 466 R Axis:   36 Text Interpretation: Sinus rhythm Prolonged PR interval Low voltage, precordial  leads since last tracing no significant change Confirmed by Noemi Chapel [phone]) on 10/16/2020 10:14:47 PM   Radiology DG Chest Port 1 View  Result Date: 10/16/2020 CLINICAL DATA:  Chest pain. EXAM: PORTABLE CHEST 1 VIEW COMPARISON:  None. FINDINGS: The heart size and mediastinal contours are within normal limits. No pneumothorax or pleural effusion is noted. Bilateral upper lobe opacities are noted which may represent pneumonia or possibly edema, but neoplasm cannot be excluded. The visualized skeletal structures are unremarkable. IMPRESSION: Bilateral upper lobe opacities are noted which may represent pneumonia or possibly edema, but neoplasm cannot be excluded. CT scan of the chest is recommended for further evaluation. Electronically Signed   By: Marijo Conception M.D.   On: 10/16/2020 22:45   ECHOCARDIOGRAM COMPLETE  Result Date: 10/17/2020    ECHOCARDIOGRAM REPORT   Patient Name:   Richard Webb Date of Exam: 10/17/2020 Medical Rec #:  [id number]        Height:       72.0 in Accession #:    [id number]       Weight:       214.9 lb Date of Birth:  [birth year]        BSA:          2.197 m Patient Age:    50 years         BP:           119/72 mmHg Patient Gender: M                HR:           60 bpm. Exam Location:  Inpatient Procedure: 2D Echo, Cardiac Doppler and Color Doppler Indications:    Dyspnea R06.00  History:        Patient has no prior history of Echocardiogram examinations and                 Patient has prior history of Echocardiogram examinations, most                 recent 08/10/2020. CAD, Pulmonary HTN; Risk Factors:Dyslipidemia                 and Sleep Apnea. Sarcoidosis. GERD. Asthma.  Sonographer:    Darlina Sicilian RDCS Referring Phys: [place]  1. Left ventricular ejection fraction, by estimation, is 70 to 75%. The left ventricle has hyperdynamic function. The left ventricle has no regional wall motion abnormalities. There is mild left ventricular  hypertrophy of the basal-septal segment. Left ventricular diastolic parameters are consistent with Grade I diastolic dysfunction (impaired relaxation).  2. Right ventricular systolic function is normal. The right ventricular size is normal.  3. The mitral valve is normal in structure. Trivial mitral valve regurgitation. No evidence of mitral stenosis.  4. The aortic valve is tricuspid. Aortic valve regurgitation is not visualized. Mild aortic valve sclerosis is present, with no evidence of aortic valve stenosis. FINDINGS  Left Ventricle: Left ventricular ejection fraction, by estimation, is 70 to 75%. The left ventricle has hyperdynamic function. The left ventricle has no regional wall motion abnormalities. The left ventricular internal cavity size was normal in size. There is mild left ventricular hypertrophy of the basal-septal segment. Left ventricular diastolic parameters are consistent  with Grade I diastolic dysfunction (impaired relaxation). Right Ventricle: The right ventricular size is normal. Right ventricular systolic function is normal. Left Atrium: Left atrial size was normal in size. Right Atrium: Right atrial size was normal in size. Pericardium: There is no evidence of pericardial effusion. Mitral Valve: The mitral valve is normal in structure. Mild mitral annular calcification. Trivial mitral valve regurgitation. No evidence of mitral valve stenosis. Tricuspid Valve: The tricuspid valve is normal in structure. Tricuspid valve regurgitation is trivial. No evidence of tricuspid stenosis. Aortic Valve: The aortic valve is tricuspid. Aortic valve regurgitation is not visualized. Mild aortic valve sclerosis is present, with no evidence of aortic valve stenosis. Pulmonic Valve: The pulmonic valve was normal in structure. Pulmonic valve regurgitation is trivial. No evidence of pulmonic stenosis. Aorta: The aortic root is normal in size and structure. Venous: The inferior vena cava was not well visualized.  IAS/Shunts: The interatrial septum was not well visualized.  LEFT VENTRICLE PLAX 2D LVIDd:         4.44 cm  Diastology LVIDs:         2.35 cm  LV e' medial:    5.33 cm/s LV PW:         0.89 cm  LV E/e' medial:  13.6 LV IVS:        1.38 cm  LV e' lateral:   5.44 cm/s LVOT diam:     2.30 cm  LV E/e' lateral: 13.3 LV SV:         110 LV SV Index:   50 LVOT Area:     4.15 cm                          3D Volume EF:                         3D EF:        54 %                         LV EDV:       130 ml                         LV ESV:       59 ml                         LV SV:        70 ml RIGHT VENTRICLE RV S prime:     9.03 cm/s TAPSE (M-mode): 2.0 cm LEFT ATRIUM             Index       RIGHT ATRIUM           Index LA diam:        3.80 cm 1.73 cm/m  RA Area:     10.90 cm LA Vol (A2C):   45.6 ml 20.76 ml/m RA Volume:   18.90 ml  8.60 ml/m LA Vol (A4C):   46.7 ml 21.26 ml/m LA Biplane Vol: 47.1 ml 21.44 ml/m  AORTIC VALVE LVOT Vmax:   115.00 cm/s LVOT Vmean:  87.500 cm/s LVOT VTI:    0.265 m  AORTA Ao Root diam: 3.70 cm Ao Asc diam:  3.40 cm MITRAL VALVE MV Area (PHT): 2.01 cm    SHUNTS MV Decel Time: 377 msec    Systemic  VTI:  0.26 m MV E velocity: 72.40 cm/s  Systemic Diam: 2.30 cm MV A velocity: 81.80 cm/s MV E/A ratio:  0.89 Kirk Ruths MD Electronically signed by Kirk Ruths MD Signature Date/Time: 10/17/2020/12:12:14 PM    Final     Procedures .Critical Care Performed by: Noemi Chapel, MD Authorized by: Noemi Chapel, MD   Critical care provider statement:    Critical care time (minutes):  35   Critical care time was exclusive of:  Separately billable procedures and treating other patients and teaching time   Critical care was necessary to treat or prevent imminent or life-threatening deterioration of the following conditions:  Cardiac failure   Critical care was time spent personally by me on the following activities:  Blood draw for specimens, development of treatment plan with patient or  surrogate, discussions with consultants, evaluation of patient's response to treatment, examination of patient, obtaining history from patient or surrogate, ordering and performing treatments and interventions, ordering and review of laboratory studies, ordering and review of radiographic studies, pulse oximetry, re-evaluation of patient's condition and review of old charts     Medications Ordered in ED Medications  aspirin chewable tablet 324 mg (has no administration in time range)    ED Course  I have reviewed the triage vital signs and the nursing notes.  Pertinent labs & imaging results that were available during my care of the patient were reviewed by me and considered in my medical decision making (see chart for details).  Clinical Course as of 10/17/20 1547  Sat Oct 16, 2020  2302 Room 11, Danelle Earthly, this patient is 15 with COPD, presented with significant shortness of breath, wheezing, prolonged expiratory phase, x-ray negative, labs unremarkable, the patient states that he wants to go home, on a continuous nebulizer right now, steroids, Covid negative, no pneumonia but had increased cough, needs to go home on doxycycline if still improved after continuous neb completed. [BM]    Clinical Course User Index [BM] Noemi Chapel, MD   MDM Rules/Calculators/A&P                          There is no signs of heart failure, no JVD or peripheral edema, no pulmonary edema, clear lung sounds, clear heart sounds, no ectopy, no murmurs.  On exam this patient does not appear to be in any distress and has no abdominal tenderness to suggest an acute surgical cause.  His EKG does show some Q waves in the inferior leads, these were found on prior EKGs.  There is no ST elevation or depression.  His symptoms are improving and this was nonexertional chest discomfort.  He has not had any recent chest discomfort to suggest that he has a progressive angina.  Troponin pending, aspirin given, cardiac  monitoring, chest x-ray  The patient story is concerning for coronary symptoms given his risk factors albeit not exertional or anginal in textbook appearance.  If positive troponin patient will need to be admitted to the hospital with anticoagulation   Final Clinical Impression(s) / ED Diagnoses Final diagnoses:  Nonspecific chest pain  Elevated troponin      Noemi Chapel, MD 10/17/20 [phone]

## 2020-10-17 ENCOUNTER — Inpatient Hospital Stay (HOSPITAL_COMMUNITY): Payer: Medicare Other

## 2020-10-17 ENCOUNTER — Encounter (HOSPITAL_COMMUNITY): Payer: Self-pay | Admitting: Family Medicine

## 2020-10-17 ENCOUNTER — Observation Stay (HOSPITAL_COMMUNITY): Payer: Medicare Other

## 2020-10-17 DIAGNOSIS — Z87891 Personal history of nicotine dependence: Secondary | ICD-10-CM | POA: Diagnosis not present

## 2020-10-17 DIAGNOSIS — I440 Atrioventricular block, first degree: Secondary | ICD-10-CM | POA: Diagnosis present

## 2020-10-17 DIAGNOSIS — G2581 Restless legs syndrome: Secondary | ICD-10-CM | POA: Diagnosis not present

## 2020-10-17 DIAGNOSIS — D869 Sarcoidosis, unspecified: Secondary | ICD-10-CM | POA: Diagnosis not present

## 2020-10-17 DIAGNOSIS — R0600 Dyspnea, unspecified: Secondary | ICD-10-CM

## 2020-10-17 DIAGNOSIS — Z20822 Contact with and (suspected) exposure to covid-19: Secondary | ICD-10-CM | POA: Diagnosis not present

## 2020-10-17 DIAGNOSIS — K219 Gastro-esophageal reflux disease without esophagitis: Secondary | ICD-10-CM

## 2020-10-17 DIAGNOSIS — Z79899 Other long term (current) drug therapy: Secondary | ICD-10-CM | POA: Diagnosis not present

## 2020-10-17 DIAGNOSIS — I214 Non-ST elevation (NSTEMI) myocardial infarction: Secondary | ICD-10-CM | POA: Diagnosis not present

## 2020-10-17 DIAGNOSIS — Z96653 Presence of artificial knee joint, bilateral: Secondary | ICD-10-CM | POA: Diagnosis not present

## 2020-10-17 DIAGNOSIS — E785 Hyperlipidemia, unspecified: Secondary | ICD-10-CM | POA: Diagnosis not present

## 2020-10-17 DIAGNOSIS — I10 Essential (primary) hypertension: Secondary | ICD-10-CM | POA: Diagnosis not present

## 2020-10-17 DIAGNOSIS — J45909 Unspecified asthma, uncomplicated: Secondary | ICD-10-CM

## 2020-10-17 DIAGNOSIS — Z7982 Long term (current) use of aspirin: Secondary | ICD-10-CM | POA: Diagnosis not present

## 2020-10-17 DIAGNOSIS — R778 Other specified abnormalities of plasma proteins: Secondary | ICD-10-CM

## 2020-10-17 DIAGNOSIS — J4520 Mild intermittent asthma, uncomplicated: Secondary | ICD-10-CM | POA: Diagnosis not present

## 2020-10-17 DIAGNOSIS — R7989 Other specified abnormal findings of blood chemistry: Secondary | ICD-10-CM

## 2020-10-17 DIAGNOSIS — R739 Hyperglycemia, unspecified: Secondary | ICD-10-CM

## 2020-10-17 DIAGNOSIS — N401 Enlarged prostate with lower urinary tract symptoms: Secondary | ICD-10-CM | POA: Diagnosis present

## 2020-10-17 DIAGNOSIS — I2510 Atherosclerotic heart disease of native coronary artery without angina pectoris: Secondary | ICD-10-CM | POA: Diagnosis not present

## 2020-10-17 DIAGNOSIS — R079 Chest pain, unspecified: Secondary | ICD-10-CM | POA: Diagnosis not present

## 2020-10-17 DIAGNOSIS — I2699 Other pulmonary embolism without acute cor pulmonale: Secondary | ICD-10-CM | POA: Diagnosis not present

## 2020-10-17 DIAGNOSIS — I2693 Single subsegmental pulmonary embolism without acute cor pulmonale: Secondary | ICD-10-CM | POA: Diagnosis not present

## 2020-10-17 DIAGNOSIS — D72829 Elevated white blood cell count, unspecified: Secondary | ICD-10-CM | POA: Diagnosis not present

## 2020-10-17 DIAGNOSIS — I472 Ventricular tachycardia: Secondary | ICD-10-CM | POA: Diagnosis not present

## 2020-10-17 DIAGNOSIS — Z7951 Long term (current) use of inhaled steroids: Secondary | ICD-10-CM | POA: Diagnosis not present

## 2020-10-17 DIAGNOSIS — G4733 Obstructive sleep apnea (adult) (pediatric): Secondary | ICD-10-CM | POA: Diagnosis present

## 2020-10-17 DIAGNOSIS — D860 Sarcoidosis of lung: Secondary | ICD-10-CM | POA: Diagnosis present

## 2020-10-17 DIAGNOSIS — R0789 Other chest pain: Secondary | ICD-10-CM | POA: Diagnosis not present

## 2020-10-17 DIAGNOSIS — I700 Atherosclerosis of aorta: Secondary | ICD-10-CM | POA: Diagnosis not present

## 2020-10-17 DIAGNOSIS — R0602 Shortness of breath: Secondary | ICD-10-CM | POA: Diagnosis not present

## 2020-10-17 DIAGNOSIS — K5792 Diverticulitis of intestine, part unspecified, without perforation or abscess without bleeding: Secondary | ICD-10-CM | POA: Diagnosis not present

## 2020-10-17 DIAGNOSIS — E782 Mixed hyperlipidemia: Secondary | ICD-10-CM | POA: Diagnosis not present

## 2020-10-17 DIAGNOSIS — D72828 Other elevated white blood cell count: Secondary | ICD-10-CM | POA: Diagnosis present

## 2020-10-17 DIAGNOSIS — Z8719 Personal history of other diseases of the digestive system: Secondary | ICD-10-CM | POA: Diagnosis not present

## 2020-10-17 DIAGNOSIS — Z955 Presence of coronary angioplasty implant and graft: Secondary | ICD-10-CM | POA: Diagnosis not present

## 2020-10-17 DIAGNOSIS — Z9049 Acquired absence of other specified parts of digestive tract: Secondary | ICD-10-CM | POA: Diagnosis not present

## 2020-10-17 LAB — MAGNESIUM: Magnesium: 2.2 mg/dL (ref 1.7–2.4)

## 2020-10-17 LAB — TROPONIN I (HIGH SENSITIVITY)
Troponin I (High Sensitivity): 29 ng/L — ABNORMAL HIGH (ref <18)
Troponin I (High Sensitivity): 66 ng/L — ABNORMAL HIGH (ref <18)
Troponin I (High Sensitivity): 137 ng/L (ref <18)
Troponin I (High Sensitivity): 178 ng/L (ref <18)
Troponin I (High Sensitivity): 207 ng/L (ref <18)
Troponin I (High Sensitivity): 196 ng/L (ref <18)

## 2020-10-17 LAB — APTT: aPTT: 29 seconds (ref 24–36)

## 2020-10-17 LAB — COMPREHENSIVE METABOLIC PANEL
ALT: 14 U/L (ref 0–44)
AST: 19 U/L (ref 15–41)
Albumin: 3.9 g/dL (ref 3.5–5.0)
Alkaline Phosphatase: 110 U/L (ref 38–126)
Anion gap: 10 (ref 5–15)
BUN: 18 mg/dL (ref 8–23)
CO2: 21 mmol/L — ABNORMAL LOW (ref 22–32)
Calcium: 9.1 mg/dL (ref 8.9–10.3)
Chloride: 104 mmol/L (ref 98–111)
Creatinine, Ser: 0.83 mg/dL (ref 0.61–1.24)
GFR, Estimated: >60 mL/min (ref >60)
Glucose, Bld: 189 mg/dL — ABNORMAL HIGH (ref 70–99)
Potassium: 3.8 mmol/L (ref 3.5–5.1)
Sodium: 135 mmol/L (ref 135–145)
Total Bilirubin: 0.6 mg/dL (ref 0.3–1.2)
Total Protein: 7.6 g/dL (ref 6.5–8.1)

## 2020-10-17 LAB — CBC
HCT: 40 % (ref 39.0–52.0)
Hemoglobin: 12.9 g/dL — ABNORMAL LOW (ref 13.0–17.0)
MCH: 28.2 pg (ref 26.0–34.0)
MCHC: 32.3 g/dL (ref 30.0–36.0)
MCV: 87.5 fL (ref 80.0–100.0)
Platelets: 313 10*3/uL (ref 150–400)
RBC: 4.57 MIL/uL (ref 4.22–5.81)
RDW: 15.4 % (ref 11.5–15.5)
WBC: 14.4 10*3/uL — ABNORMAL HIGH (ref 4.0–10.5)
nRBC: 0 % (ref 0.0–0.2)

## 2020-10-17 LAB — HEMOGLOBIN A1C
Hgb A1c MFr Bld: 6.2 % — ABNORMAL HIGH (ref 4.8–5.6)
Mean Plasma Glucose: 131.24 mg/dL

## 2020-10-17 LAB — ECHOCARDIOGRAM COMPLETE
Area-P 1/2: 2.01 cm2
Height: 72 in
S' Lateral: 2.35 cm
Weight: 3439.18 oz

## 2020-10-17 LAB — PROTIME-INR
INR: 1.1 (ref 0.8–1.2)
Prothrombin Time: 13.4 seconds (ref 11.4–15.2)

## 2020-10-17 LAB — PHOSPHORUS: Phosphorus: 3.1 mg/dL (ref 2.5–4.6)

## 2020-10-17 LAB — GLUCOSE, CAPILLARY: Glucose-Capillary: 115 mg/dL — ABNORMAL HIGH (ref 70–99)

## 2020-10-17 LAB — CBG MONITORING, ED: Glucose-Capillary: 126 mg/dL — ABNORMAL HIGH (ref 70–99)

## 2020-10-17 LAB — SARS CORONAVIRUS 2 (TAT 6-24 HRS): SARS Coronavirus 2: NEGATIVE

## 2020-10-17 LAB — HEPARIN LEVEL (UNFRACTIONATED): Heparin Unfractionated: 0.39 IU/mL (ref 0.30–0.70)

## 2020-10-17 MED ORDER — MONTELUKAST SODIUM 10 MG PO TABS
10.0000 mg | ORAL_TABLET | Freq: Every day | ORAL | Status: DC
  Administered 2020-10-17 – 2020-10-20 (×4): 10 mg via ORAL
  Filled 2020-10-17 (×4): qty 1

## 2020-10-17 MED ORDER — CARBIDOPA-LEVODOPA 10-100 MG PO TABS
1.0000 | ORAL_TABLET | Freq: Every day | ORAL | Status: DC
  Administered 2020-10-18 – 2020-10-20 (×3): 1 via ORAL
  Filled 2020-10-17 (×6): qty 1

## 2020-10-17 MED ORDER — IOHEXOL 350 MG/ML SOLN
100.0000 mL | Freq: Once | INTRAVENOUS | Status: AC | PRN
  Administered 2020-10-17: 100 mL via INTRAVENOUS

## 2020-10-17 MED ORDER — ASPIRIN EC 81 MG PO TBEC
81.0000 mg | DELAYED_RELEASE_TABLET | Freq: Every day | ORAL | Status: DC
  Administered 2020-10-17 – 2020-10-18 (×2): 81 mg via ORAL
  Filled 2020-10-17 (×2): qty 1

## 2020-10-17 MED ORDER — FINASTERIDE 5 MG PO TABS
5.0000 mg | ORAL_TABLET | Freq: Every day | ORAL | Status: DC
  Administered 2020-10-17 – 2020-10-21 (×4): 5 mg via ORAL
  Filled 2020-10-17 (×4): qty 1

## 2020-10-17 MED ORDER — AMLODIPINE BESYLATE 5 MG PO TABS: 10.0000 mg | ORAL_TABLET | Freq: Every day | ORAL | Status: DC

## 2020-10-17 MED ORDER — TEMAZEPAM 15 MG PO CAPS
30.0000 mg | ORAL_CAPSULE | Freq: Every evening | ORAL | Status: DC | PRN
  Administered 2020-10-17 – 2020-10-20 (×4): 30 mg via ORAL
  Filled 2020-10-17: qty 4
  Filled 2020-10-17 (×3): qty 2

## 2020-10-17 MED ORDER — FLUTICASONE FUROATE-VILANTEROL 200-25 MCG/INH IN AEPB
1.0000 | INHALATION_SPRAY | Freq: Every day | RESPIRATORY_TRACT | Status: DC
  Administered 2020-10-18: 08:00:00 1 via RESPIRATORY_TRACT
  Filled 2020-10-17 (×2): qty 28

## 2020-10-17 MED ORDER — PANTOPRAZOLE SODIUM 40 MG PO TBEC: 40.0000 mg | DELAYED_RELEASE_TABLET | Freq: Every day | ORAL | Status: DC

## 2020-10-17 MED ORDER — INSULIN ASPART 100 UNIT/ML ~~LOC~~ SOLN
0.0000 [IU] | Freq: Three times a day (TID) | SUBCUTANEOUS | Status: DC
  Administered 2020-10-18: 1 [IU] via SUBCUTANEOUS
  Administered 2020-10-19: 2 [IU] via SUBCUTANEOUS
  Administered 2020-10-19: 1 [IU] via SUBCUTANEOUS
  Administered 2020-10-20: 2 [IU] via SUBCUTANEOUS

## 2020-10-17 MED ORDER — PANTOPRAZOLE SODIUM 40 MG PO TBEC
40.0000 mg | DELAYED_RELEASE_TABLET | Freq: Two times a day (BID) | ORAL | Status: DC
  Administered 2020-10-17 – 2020-10-21 (×8): 40 mg via ORAL
  Filled 2020-10-17 (×8): qty 1

## 2020-10-17 MED ORDER — CHLORHEXIDINE GLUCONATE CLOTH 2 % EX PADS
6.0000 | MEDICATED_PAD | Freq: Every day | CUTANEOUS | Status: DC
  Administered 2020-10-17 – 2020-10-21 (×4): 6 via TOPICAL

## 2020-10-17 MED ORDER — NITROGLYCERIN 0.4 MG SL SUBL: 0.4000 mg | SUBLINGUAL_TABLET | SUBLINGUAL | Status: DC | PRN

## 2020-10-17 MED ORDER — ISOSORBIDE MONONITRATE ER 30 MG PO TB24
30.0000 mg | ORAL_TABLET | Freq: Every day | ORAL | Status: DC
  Administered 2020-10-17 – 2020-10-21 (×4): 30 mg via ORAL
  Filled 2020-10-17 (×4): qty 1

## 2020-10-17 MED ORDER — CLOPIDOGREL BISULFATE 75 MG PO TABS
75.0000 mg | ORAL_TABLET | Freq: Every day | ORAL | Status: DC
  Administered 2020-10-17 – 2020-10-18 (×2): 75 mg via ORAL
  Filled 2020-10-17 (×2): qty 1

## 2020-10-17 MED ORDER — METOPROLOL TARTRATE 25 MG PO TABS
25.0000 mg | ORAL_TABLET | Freq: Two times a day (BID) | ORAL | Status: DC
  Administered 2020-10-17 – 2020-10-21 (×8): 25 mg via ORAL
  Filled 2020-10-17 (×8): qty 1

## 2020-10-17 MED ORDER — ENOXAPARIN SODIUM 40 MG/0.4ML ~~LOC~~ SOLN
40.0000 mg | SUBCUTANEOUS | Status: DC
  Administered 2020-10-17: 40 mg via SUBCUTANEOUS
  Filled 2020-10-17: qty 0.4

## 2020-10-17 MED ORDER — ATORVASTATIN CALCIUM 10 MG PO TABS
20.0000 mg | ORAL_TABLET | Freq: Every day | ORAL | Status: DC
  Administered 2020-10-17 – 2020-10-20 (×4): 20 mg via ORAL
  Filled 2020-10-17 (×4): qty 2

## 2020-10-17 MED ORDER — HEPARIN (PORCINE) 25000 UT/250ML-% IV SOLN
1200.0000 [IU]/h | INTRAVENOUS | Status: DC
  Administered 2020-10-17 – 2020-10-19 (×3): 1200 [IU]/h via INTRAVENOUS
  Filled 2020-10-17 (×3): qty 250

## 2020-10-17 MED ORDER — METOPROLOL TARTRATE 25 MG PO TABS: 12.5000 mg | ORAL_TABLET | Freq: Two times a day (BID) | ORAL | Status: DC

## 2020-10-17 MED ORDER — INSULIN ASPART 100 UNIT/ML ~~LOC~~ SOLN: 0.0000 [IU] | Freq: Every day | SUBCUTANEOUS | Status: DC

## 2020-10-17 MED ORDER — LOSARTAN POTASSIUM 50 MG PO TABS
50.0000 mg | ORAL_TABLET | Freq: Every day | ORAL | Status: DC
  Administered 2020-10-17 – 2020-10-21 (×4): 50 mg via ORAL
  Filled 2020-10-17 (×2): qty 1
  Filled 2020-10-17: qty 2
  Filled 2020-10-17: qty 1

## 2020-10-17 NOTE — Progress Notes (Signed)
ANTICOAGULATION CONSULT NOTE - Initial Consult  Pharmacy Consult for Heparin Indication: chest pain/ACS  Allergies  Allergen Reactions  . Bee Venom Anaphylaxis  . Statins     Myalgias - Simvastatin and Rosuvastatin    Patient Measurements: Height: 6' (182.9 cm) Weight: 97.5 kg (214 lb 15.2 oz) IBW/kg (Calculated) : 77.6 HEPARIN DW (KG): 97.2  Vital Signs: BP: 136/75 (02/27 0830) Pulse Rate: 80 (02/27 0830)  Labs: Recent Labs    10/16/20 2215 10/17/20 0033 10/17/20 0236 10/17/20 0448 10/17/20 0729  HGB 12.9*  --   --  12.9*  --   HCT 39.8  --   --  40.0  --   PLT 311  --   --  313  --   APTT  --   --   --  29  --   LABPROT  --   --   --  13.4  --   INR  --   --   --  1.1  --   CREATININE 1.07  --   --  0.83  --   TROPONINIHS 8   < > 66* 137* 178*   < > = values in this interval not displayed.    Estimated Creatinine Clearance: 94.5 mL/min (by C-G formula based on SCr of 0.83 mg/dL).   Medical History: Past Medical History:  Diagnosis Date  . Arthritis   . Asthma   . Coronary atherosclerosis of native coronary artery    a. DES x 3 RCA and DES mCx 10/11 - [place] - with residual 50-70% stenosis in the distal Cx in AV groove, 70% distal LAD beyond apex, 70% small D1. b. 12/2017: similar results with multivessel CAD. Patent stents. Medical management recommended.   Marland Kitchen GERD (gastroesophageal reflux disease)   . History of kidney stones   . Mixed hyperlipidemia   . Obstructive sleep apnea   . Pulmonary hypertension (HCC)    PASP 50 mmHg  . Restless leg syndrome   . Sarcoidosis   . Statin intolerance   . Ulcerative colitis ([place])     Medications:  (Not in a hospital admission)   Assessment: Baseline labs WNL.  Start Heparin, no bolus, per MD.  Pt received Lovenox dose this am ~ 0730  Goal of Therapy:  Heparin level 0.3-0.7 units/ml Monitor platelets by anticoagulation protocol: Yes   Plan:  Heparin infusion at 1200 units/hr (no bolus per MD  instructions) Check heparin level ~ 8 hours after heparin started.   [place] Crane, Scott A 10/17/2020,10:39 AM

## 2020-10-17 NOTE — Progress Notes (Addendum)
Patient seen and evaluated, chart reviewed, please see EMR for updated orders. Please see full H&P dictated by admitting physician Dr Josephine Cables for same date of service.     Brief Summary:- 75 y.o. 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 Rudean Haskell who recommends medical management rather than transfer to [facility] at this time -Start IV heparin without bolus--- watch for bleeding (prior history of hematuria and prior history of peptic ulcer disease with GI bleed) -Start isosorbide, metoprolol, Lipitor and aspirin, and Plavix -EKG non-acute at this time -Currently chest pain-free  2)Hyperglycemia--- no prior history of diabetes, -A1c 6.2 -Suspect stress related  -Use Novolog/Humalog Sliding scale insulin with Accu-Cheks/Fingersticks as ordered  3) leukocytosis-- -No fevers, -Chest x-ray suggestive of Pneumonia Versus Malignancy, chest x-ray findings may be related to underlying sarcoidosis -We will get CTA chest to rule out PE and possible malignancy  4)OSA--- continue CPAP nightly  5) asthma and sarcoidosis--- chest x-ray findings may be related to underlying pulmonary sarcoidosis -Continue bronchodilators  6)H/o PUD--with prior GI bleed, continue PPI especially with DAPT and IV heparin use  7)BPH--with hematuria in the past, continue Proscar watch for hematuria while on DAPT  and IV heparin  -Total care time 22 minutes  75 y.o. male with medical history significant for CAD s/p for stent placement, hypertension, ulcerative colitis, GERD, diverticulitis, obstructive sleep apnea, asthma/sarcoidosis

## 2020-10-17 NOTE — ED Notes (Addendum)
Date and time results received: 10/17/20 & 1152hrs (use smartphrase ".now" to insert current time)  Test: Troponin Critical Value: 207  Name of Provider Notified: Dr. Roderic Palau and Dr, Renaye Rakers   Orders Received? Or Actions Taken?: Notified

## 2020-10-17 NOTE — ED Notes (Signed)
Report called to [facility] ICU

## 2020-10-17 NOTE — ED Notes (Signed)
Pt ambulated to restroom stable on feet.

## 2020-10-17 NOTE — H&P (Signed)
History and Physical  Cort Dragoo JKK:[id number] DOB: May 30, 1946 DOA: 10/16/2020  Referring physician: Merrily Pew, MD PCP: [name], MD  Patient coming from: Home  Chief Complaint: Chest Pain  HPI: Richard Webb is a 75 y.o. male with medical history significant for CAD s/p for stent placement, hypertension, ulcerative colitis, GERD, diverticulitis, obstructive sleep apnea, asthma/sarcoidosis who presents to the emergency department due to chest pain that started last night.  Patient states that on returning home after having dinner with some friends yesterday, he developed a midsternal chest pressure associated with abdominal bloating and with radiation to left, chest pressure was nonreproducible, he took Pepto-Bismol due to the abdominal bloating, but there was no improvement.  Patient states that he checked his heart rate at home and it was 110 bpm, he took his evening meds without any improvement, so he called his son who brought him to the ED for further evaluation.  Patient states that his chest pain self resolved after he got to the ED.  ED Course:  In the emergency department, slightly tachypneic, otherwise, he was hemodynamically stable.  Work-up in the ED showed mild leukocytosis, hyperglycemia, troponin 8 > 29 > 66.  Lipase 37.   Chest x-ray showed bilateral upper lobe opacities are noted which may represent pneumonia or possibly edema, but neoplasm cannot be excluded. He was treated with aspirin.  Hospitalist was asked to admit patient for further evaluation and management.   Review of Systems: Constitutional: Negative for chills and fever.  HENT: Negative for ear pain and sore throat.   Eyes: Negative for pain and visual disturbance.  Respiratory: Negative for cough, chest tightness and shortness of breath.   Cardiovascular: Positive for chest pressure.  Negative for palpitations.  Gastrointestinal: Positive for abdominal bloating.  Negative for vomiting.   Endocrine: Negative for polyphagia and polyuria.  Genitourinary: Negative for decreased urine volume, dysuria, enuresis, hematuria Musculoskeletal: Negative for arthralgias and back pain.  Skin: Negative for color change and rash.  Allergic/Immunologic: Negative for immunocompromised state.  Neurological: Negative for tremors, syncope, speech difficulty, weakness, light-headedness and headaches.  Hematological: Does not bruise/bleed easily.  All other systems reviewed and are negative   Past Medical History:  Diagnosis Date  . Arthritis   . Asthma   . Coronary atherosclerosis of native coronary artery    a. DES x 3 RCA and DES mCx 10/11 - [place] - with residual 50-70% stenosis in the distal Cx in AV groove, 70% distal LAD beyond apex, 70% small D1. b. 12/2017: similar results with multivessel CAD. Patent stents. Medical management recommended.   Marland Kitchen GERD (gastroesophageal reflux disease)   . History of kidney stones   . Mixed hyperlipidemia   . Obstructive sleep apnea   . Pulmonary hypertension (HCC)    PASP 50 mmHg  . Restless leg syndrome   . Sarcoidosis   . Statin intolerance   . Ulcerative colitis [facility])    Past Surgical History:  Procedure Laterality Date  . Ankle fracture repair Left 1985  . CARDIAC CATHETERIZATION     stents x4  . CHOLECYSTECTOMY  1970  . CYSTOSCOPY WITH INSERTION OF UROLIFT N/A 08/27/2017   Procedure: CYSTOSCOPY WITH INSERTION OF UROLIFT;  Surgeon: Cleon Gustin, MD;  Location: [facility];  Service: Urology;  Laterality: N/A;  . LAPAROSCOPIC APPENDECTOMY  2013  . LEFT HEART CATH AND CORONARY ANGIOGRAPHY N/A 01/02/2018   Procedure: LEFT HEART CATH AND CORONARY ANGIOGRAPHY;  Surgeon: Troy Sine, MD;  Location: [place] [facility];  Service: Cardiovascular;  Laterality: N/A;  . Right total knee replacement Bilateral 2005    Social History:  reports that he has never smoked. He has never used smokeless tobacco. He reports that he does not drink  alcohol and does not use drugs.   Allergies  Allergen Reactions  . Bee Venom Anaphylaxis  . Statins     Myalgias - Simvastatin and Rosuvastatin    Family History  Problem Relation Age of Onset  . Colon cancer Mother   . CAD Father    Prior to Admission medications   Medication Sig Start Date End Date Taking? Authorizing Provider  amLODipine (NORVASC) 10 MG tablet Take 10 mg by mouth daily. 09/21/20   [provider]  amLODipine (NORVASC) 2.5 MG tablet Take 1 tablet (2.5 mg total) by mouth daily. 04/20/20   Georgette Shell, MD  aspirin EC 81 MG tablet Take 81 mg by mouth daily.    [provider]  BREO ELLIPTA 200-25 MCG/INH AEPB Inhale 1 Inhaler into the lungs daily. 12/25/17   [provider]  carbidopa-levodopa (SINEMET IR) 10-100 MG per tablet Take 1 tablet by mouth every evening.    [provider]  cyanocobalamin (,VITAMIN B-12,) 1000 MCG/ML injection Inject 1,000 mcg into the muscle every 30 (thirty) days. 08/10/20   [provider]  Dupilumab 300 MG/2ML SOPN Inject 300 mg into the skin every 14 (fourteen) days.    [provider]  Evolocumab (REPATHA SURECLICK) 419 MG/ML SOAJ Inject 140 mg into the skin every 14 (fourteen) days.    [provider]  finasteride (PROSCAR) 5 MG tablet Take 1 tablet (5 mg total) by mouth daily. 09/22/20   McKenzie, Candee Furbish, MD  folic acid (FOLVITE) 1 MG tablet Take 1 mg by mouth See admin instructions. Mon - Fri    [provider]  hydrochlorothiazide (HYDRODIURIL) 25 MG tablet Take 25 mg by mouth daily.    [provider]  HYDROcodone-acetaminophen (NORCO/VICODIN) 5-325 MG tablet Take 1 tablet by mouth every 6 (six) hours as needed for severe pain. 08/06/20   [provider]  isosorbide mononitrate (IMDUR) 30 MG 24 hr tablet TAKE 1 TABLET BY MOUTH  DAILY Patient taking differently: Take 30 mg by mouth daily. 02/24/20   Satira Sark, MD  LORazepam (ATIVAN)  0.5 MG tablet Take 0.5 mg by mouth daily as needed for anxiety. 09/10/20   [provider]  losartan (COZAAR) 50 MG tablet Take 50 mg by mouth daily. 08/03/20   [provider]  metoprolol tartrate (LOPRESSOR) 25 MG tablet Take 12.5 mg by mouth 2 (two) times daily.    [provider]  montelukast (SINGULAIR) 10 MG tablet Take 10 mg by mouth at bedtime.    [provider]  nitroGLYCERIN (NITROSTAT) 0.4 MG SL tablet Place 1 tablet (0.4 mg total) under the tongue every 5 (five) minutes x 3 doses as needed for chest pain (if no relief after 3rd dose, proceed to the ED for an evaluation or call 911). 06/26/19   Satira Sark, MD  omeprazole (PRILOSEC) 20 MG capsule Take 20 mg by mouth daily as needed (acid reflux). 07/13/20   [provider]  omeprazole (PRILOSEC) 40 MG capsule Take 40 mg by mouth daily as needed (acid reflux). 02/04/20   [provider]  oxymetazoline (AFRIN) 0.05 % nasal spray Place 1 spray into both nostrils at bedtime as needed for congestion.    [provider]  polyethylene glycol (MIRALAX /  GLYCOLAX) 17 g packet Take 17 g by mouth daily as needed for mild constipation. Patient not taking: No sig reported 04/20/20   Georgette Shell, MD  potassium chloride (KLOR-CON) 10 MEQ tablet Take 1 tablet (10 mEq total) by mouth daily. 07/12/20 10/10/20  Satira Sark, MD  sulfaSALAzine (AZULFIDINE) 500 MG tablet Take 1,000 mg by mouth 2 (two) times daily.    [provider]  temazepam (RESTORIL) 30 MG capsule Take 30 mg by mouth at bedtime as needed for sleep.     [provider]  Vitamin D, Ergocalciferol, (DRISDOL) 50000 UNITS CAPS capsule Take 50,000 Units by mouth every 7 (seven) days. Thursdays    [provider]    Physical Exam: BP (!) 146/74   Pulse 82   Temp 97.9 F (36.6 C) (Oral)   Resp 17   Ht 6' (1.829 m)   Wt 97.5 kg   SpO2 97%   BMI 29.15 kg/m   . General: 75 y.o.  year-old male well developed well nourished in no acute distress.  Alert and oriented x3. Marland Kitchen HEENT: NCAT, EOMI . Neck: Supple, trachea medial . Cardiovascular: Regular rate and rhythm with no rubs or gallops.  No thyromegaly or JVD noted.  No lower extremity edema. 2/4 pulses in all 4 extremities. Marland Kitchen Respiratory: Clear to auscultation with no wheezes or rales. Good inspiratory effort. . Abdomen: Soft, nontender with normal bowel sounds x4 quadrants. . Muskuloskeletal: No cyanosis, clubbing or edema noted bilaterally . Neuro: CN II-XII intact, strength 5/5 x 4, sensation, reflexes intact . Skin: No ulcerative lesions noted or rashes . Psychiatry: Judgement and insight appear normal. Mood is appropriate for condition and setting          Labs on Admission:  Basic Metabolic Panel: Recent Labs  Lab 10/16/20 2215  NA 133*  K 4.0  CL 99  CO2 21*  GLUCOSE 306*  BUN 17  CREATININE 1.07  CALCIUM 9.0   Liver Function Tests: Recent Labs  Lab 10/16/20 2215  AST 26  ALT 12  ALKPHOS 120  BILITOT 0.8  PROT 7.8  ALBUMIN 4.0   Recent Labs  Lab 10/16/20 2215  LIPASE 37   No results for input(s): AMMONIA in the last 168 hours. CBC: Recent Labs  Lab 10/16/20 2215  WBC 11.3*  HGB 12.9*  HCT 39.8  MCV 86.5  PLT 311   Cardiac Enzymes: No results for input(s): CKTOTAL, CKMB, CKMBINDEX, TROPONINI in the last 168 hours.  BNP (last 3 results) No results for input(s): BNP in the last 8760 hours.  ProBNP (last 3 results) No results for input(s): PROBNP in the last 8760 hours.  CBG: No results for input(s): GLUCAP in the last 168 hours.  Radiological Exams on Admission: DG Chest Port 1 View  Result Date: 10/16/2020 CLINICAL DATA:  Chest pain. EXAM: PORTABLE CHEST 1 VIEW COMPARISON:  None. FINDINGS: The heart size and mediastinal contours are within normal limits. No pneumothorax or pleural effusion is noted. Bilateral upper lobe opacities are noted which may represent pneumonia  or possibly edema, but neoplasm cannot be excluded. The visualized skeletal structures are unremarkable. IMPRESSION: Bilateral upper lobe opacities are noted which may represent pneumonia or possibly edema, but neoplasm cannot be excluded. CT scan of the chest is recommended for further evaluation. Electronically Signed   By: Marijo Conception M.D.   On: 10/16/2020 22:45    EKG: I independently viewed the EKG done and my findings are as followed: Normal  sinus rhythm at a rate of 98 bpm   Assessment/Plan Present on Admission: . Chest pain  Active Problems:   Chest pain   Chest pain rule out ACS Elevated troponin levels Heart score=4 Continue telemetry  Troponins 8 >29 >66; chest pain as since resolved, continue to trend troponin EKG personally reviewed showed normal sinus rhythm at rate of 90 bpm Cardiology will be consulted to help decide if Stress test is needed in am Versus other diagnostic modalities.    Aspirin was given, continue nitroglycerin, and betablocker.   Leukocytosis possibly reactive WBC 11.3, no sign of any acute infectious process Continue to monitor WBC with morning labs  Hyperglycemia with no known history of T2DM CBG 306, hemoglobin A1c will be checked  Coronary artery disease Patient has history of stent placement.  Continue aspirin  Hypertension Continue losartan, amlodipine and metoprolol  History of ulcerative colitis/diverticulitis Patient states that he has a pending elective surgical intervention for diverticulitis as an outpatient  Gastroesophageal reflux disease Continue pantoprazole  Obstructive sleep apnea Continue home CPAP at night  Asthma/sarcoidosis Continue Breo Ellipta, Singulair Chest x-ray showed bilateral upper lobe opacities which patient states was chronic and that it was due to his sarcoidosis.  DVT prophylaxis: Lovenox  Code Status: Full code  Family Communication: None at bedside  Disposition Plan:  Patient is  from:                        home Anticipated DC to:                   Home Anticipated DC date:                1 day Anticipated DC barriers:           Patient requires inpatient admission for chest pain rule out of ACS and cardiology consult  Consults called: Cardiology  Admission status: Observation   Bernadette Hoit MD [facility]  10/17/2020, 4:22 AM

## 2020-10-17 NOTE — ED Notes (Signed)
Date and time results received: 10/17/20 0650 Test: Troponin Critical Value: 137 Name of Provider Notified: Dr. Josephine Cables

## 2020-10-17 NOTE — Progress Notes (Signed)
[place] for Heparin Indication: chest pain/ACS  Allergies  Allergen Reactions  . Bee Venom Anaphylaxis  . Statins Other (See Comments)    Myalgias - Simvastatin and Rosuvastatin    Patient Measurements: Height: 6' (182.9 cm) Weight: 100.9 kg (222 lb 7.1 oz) IBW/kg (Calculated) : 77.6 HEPARIN DW (KG): 98.2  Vital Signs: Temp: 97.4 F (36.3 C) (02/27 1749) Temp Source: Oral (02/27 1749) BP: 135/68 (02/27 1800) Pulse Rate: 58 (02/27 1800)  Labs: Recent Labs    10/16/20 2215 10/17/20 0033 10/17/20 0448 10/17/20 0729 10/17/20 0923 10/17/20 1138 10/17/20 1727  HGB 12.9*  --  12.9*  --   --   --   --   HCT 39.8  --  40.0  --   --   --   --   PLT 311  --  313  --   --   --   --   APTT  --   --  29  --   --   --   --   LABPROT  --   --  13.4  --   --   --   --   INR  --   --  1.1  --   --   --   --   HEPARINUNFRC  --   --   --   --   --   --  0.39  CREATININE 1.07  --  0.83  --   --   --   --   TROPONINIHS 8   < > 137* 178* 207* 196*  --    < > = values in this interval not displayed.    Estimated Creatinine Clearance: 96 mL/min (by C-G formula based on SCr of 0.83 mg/dL).    Assessment: 75 y.o. M started on heparin gtt for increasing troponin. On heparin gtt for r/o ACS.  Heparin level therapeutic (0.39) on gtt at 1200 units/hr. No bleeding noted.  Goal of Therapy:  Heparin level 0.3-0.7 units/ml Monitor platelets by anticoagulation protocol: Yes   Plan:  Continue heparin infusion at 1200 units/hr  Will f/u daily heparin level  Sherlon Handing, PharmD, BCPS Please see amion for complete clinical pharmacist phone list 10/17/2020,6:49 PM

## 2020-10-17 NOTE — Progress Notes (Signed)
6:48 AM RN called due to patient's troponin increasing to 137 Troponin trend since arrival to the ED: 8 > 29 >66 >137  Patient denies chest pain.  EKG repeated showed no acute changes Patient will need to be ruled out for NSTEMI.  Cardiology already consulted Heparin currently held due to no chest pain may be initiated if troponin level continues to increase.

## 2020-10-17 NOTE — ED Notes (Signed)
Pt taken by this nurse to ICU 3.

## 2020-10-17 NOTE — ED Notes (Signed)
Critical Trop 178 Dr. Joesph Fillers notified, awaiting additional orders.

## 2020-10-17 NOTE — Progress Notes (Signed)
   CTA Chest Report-----IMPRESSION: 1. Single small focus of pulmonary embolism involving a subsegmental branch of the superior segment of the left lower lobe. This is of questionable significance regarding the patient's symptoms. No central pulmonary emboli. Consider lower extremity venous doppler ultrasound. 2. Central enlargement of the pulmonary arteries consistent with pulmonary arterial hypertension. 3. 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 The basilar components have progressed compared with the prior abdominal CT and superimposed inflammation/infection is not completely excluded. No evidence of neoplasm.    --Continue IV heparin, -Await pulmonology and cardiology input  Roxan Hockey, MD

## 2020-10-17 NOTE — ED Notes (Signed)
Pt is now inpatient status and vital signs will be completed accordingly.

## 2020-10-18 ENCOUNTER — Other Ambulatory Visit: Payer: Self-pay | Admitting: Internal Medicine

## 2020-10-18 DIAGNOSIS — I214 Non-ST elevation (NSTEMI) myocardial infarction: Secondary | ICD-10-CM | POA: Diagnosis not present

## 2020-10-18 DIAGNOSIS — I10 Essential (primary) hypertension: Secondary | ICD-10-CM | POA: Diagnosis not present

## 2020-10-18 DIAGNOSIS — E782 Mixed hyperlipidemia: Secondary | ICD-10-CM

## 2020-10-18 DIAGNOSIS — I2699 Other pulmonary embolism without acute cor pulmonale: Secondary | ICD-10-CM

## 2020-10-18 DIAGNOSIS — Z789 Other specified health status: Secondary | ICD-10-CM

## 2020-10-18 DIAGNOSIS — E785 Hyperlipidemia, unspecified: Secondary | ICD-10-CM | POA: Diagnosis not present

## 2020-10-18 LAB — CBC
HCT: 38.2 % — ABNORMAL LOW (ref 39.0–52.0)
Hemoglobin: 12 g/dL — ABNORMAL LOW (ref 13.0–17.0)
MCH: 28.1 pg (ref 26.0–34.0)
MCHC: 31.4 g/dL (ref 30.0–36.0)
MCV: 89.5 fL (ref 80.0–100.0)
Platelets: 300 10*3/uL (ref 150–400)
RBC: 4.27 MIL/uL (ref 4.22–5.81)
RDW: 15.9 % — ABNORMAL HIGH (ref 11.5–15.5)
WBC: 11.2 10*3/uL — ABNORMAL HIGH (ref 4.0–10.5)
nRBC: 0 % (ref 0.0–0.2)

## 2020-10-18 LAB — COMPREHENSIVE METABOLIC PANEL
ALT: 17 U/L (ref 0–44)
AST: 19 U/L (ref 15–41)
Albumin: 3.6 g/dL (ref 3.5–5.0)
Alkaline Phosphatase: 71 U/L (ref 38–126)
Anion gap: 11 (ref 5–15)
BUN: 21 mg/dL (ref 8–23)
CO2: 24 mmol/L (ref 22–32)
Calcium: 9.1 mg/dL (ref 8.9–10.3)
Chloride: 104 mmol/L (ref 98–111)
Creatinine, Ser: 0.95 mg/dL (ref 0.61–1.24)
GFR, Estimated: >60 mL/min (ref >60)
Glucose, Bld: 113 mg/dL — ABNORMAL HIGH (ref 70–99)
Potassium: 3.6 mmol/L (ref 3.5–5.1)
Sodium: 139 mmol/L (ref 135–145)
Total Bilirubin: 0.7 mg/dL (ref 0.3–1.2)
Total Protein: 6.6 g/dL (ref 6.5–8.1)

## 2020-10-18 LAB — GLUCOSE, CAPILLARY
Glucose-Capillary: 90 mg/dL (ref 70–99)
Glucose-Capillary: 111 mg/dL — ABNORMAL HIGH (ref 70–99)
Glucose-Capillary: 130 mg/dL — ABNORMAL HIGH (ref 70–99)
Glucose-Capillary: 126 mg/dL — ABNORMAL HIGH (ref 70–99)

## 2020-10-18 LAB — MRSA PCR SCREENING: MRSA by PCR: NEGATIVE

## 2020-10-18 LAB — HEPARIN LEVEL (UNFRACTIONATED): Heparin Unfractionated: 0.38 IU/mL (ref 0.30–0.70)

## 2020-10-18 MED ORDER — ASPIRIN 81 MG PO CHEW
81.0000 mg | CHEWABLE_TABLET | ORAL | Status: AC
  Administered 2020-10-19: 81 mg via ORAL
  Filled 2020-10-18: qty 1

## 2020-10-18 MED ORDER — LOPERAMIDE HCL 2 MG PO CAPS
2.0000 mg | ORAL_CAPSULE | Freq: Four times a day (QID) | ORAL | Status: DC | PRN
  Administered 2020-10-18 – 2020-10-19 (×2): 2 mg via ORAL
  Filled 2020-10-18 (×2): qty 1

## 2020-10-18 MED ORDER — SODIUM CHLORIDE 0.9 % IV SOLN: 250.0000 mL | INTRAVENOUS | Status: DC | PRN

## 2020-10-18 MED ORDER — SODIUM CHLORIDE 0.9% FLUSH
3.0000 mL | Freq: Two times a day (BID) | INTRAVENOUS | Status: DC
  Administered 2020-10-18: 3 mL via INTRAVENOUS

## 2020-10-18 MED ORDER — SODIUM CHLORIDE 0.9 % WEIGHT BASED INFUSION
3.0000 mL/kg/h | INTRAVENOUS | Status: DC
  Administered 2020-10-19: 3 mL/kg/h via INTRAVENOUS

## 2020-10-18 MED ORDER — SODIUM CHLORIDE 0.9% FLUSH: 3.0000 mL | INTRAVENOUS | Status: DC | PRN

## 2020-10-18 MED ORDER — ASPIRIN EC 81 MG PO TBEC
81.0000 mg | DELAYED_RELEASE_TABLET | Freq: Every day | ORAL | Status: DC
  Administered 2020-10-20: 81 mg via ORAL
  Filled 2020-10-18: qty 1

## 2020-10-18 MED ORDER — SODIUM CHLORIDE 0.9 % WEIGHT BASED INFUSION: 1.0000 mL/kg/h | INTRAVENOUS | Status: DC

## 2020-10-18 NOTE — Progress Notes (Signed)
Report called and given to Estill Bamberg, RN on [place].

## 2020-10-18 NOTE — Consult Note (Signed)
NAME:  Richard Webb, MRN:  [id number], DOB:  03/30/1946, LOS: 1 ADMISSION DATE:  10/16/2020, CONSULTATION DATE:  10/18/20 REFERRING MD:  Dr.Emokpae, CHIEF COMPLAINT:  Pulmonary consult    Brief History:  75yo male presented initially to [facility] for acute onset of mediastinal chest pressure. Given history of asthma/sarcidosis and possible need for LHC a pulmonary consult was requested   History of Present Illness:  Richard Webb is a 75 y.o. with a past medical history significant for ulcerative colitis, pulmonary sarcoidosis, pulmonary hypertension, obstructive sleep apnea, CAD status post PCI, hypertension, hyperlipidemia, asthma, and GERD to [facility] department with complaints of substernal chest pain.  Patient reports chest pain was associated with abdominal bloating with radiation to left arm described as pressure-like in sensation.  Patient tried Pepto-Bismol for abdominal bloating with little improvement seen.  Patient reports chest pain resolved on arrival to ED.  On arrival patient was seen mildly tachypneic but otherwise hemodynamically stable.  On admission included glucose 199, white blood cell count 14.4.  Since admission high-sensitivity troponin with peak of 207.  CTA chest obtained which revealed single small focus of pulmonary emboli involving subsegmental branches of the superior segment of the left lower lobe, enlargement of pulmonary arteries consistent with pulmonary artery hypertension, and evidence of chronic sarcoidosis.  Patient was transferred from [facility] emergency department to [facility] for further evaluation including left heart cath and pulmonary consultation  Additional pulmonary history includes: Patient was diagnoses with Sarcoid in 1996 and was treated with MTX for 5-30yr and has been off MTX since 2001. His sarcoidosis was diagnoses by tissue biopsy. Additional family members seen with sarcoidosis includes his sister, she was also diagnosed at an  early age and also has a history of smoking. [place] but did not work in the [place] himself. No exotic animal exposure, he only has a cat as a pet. He has never smoked.   Past Medical History:  Ulcerative colitis, pulmonary sarcoidosis, pulmonary hypertension, obstructive sleep apnea, CAD status post PCI, hypertension, hyperlipidemia, asthma, and GERD   Significant Hospital Events:  [facility]  Consults:  Cardiology Pulmonary  Procedures:    Significant Diagnostic Tests:  CTA chest 2/27 > CTA chest obtained which revealed single small focus of pulmonary emboli involving subsegmental branches of the superior segment of the left lower lobe, enlargement of pulmonary arteries consistent with pulmonary artery hypertension, and evidence of chronic sarcoidosis.   Micro Data:  Covid 2/27 > negative MRSA PCR 2/27 > negative  Antimicrobials:    Interim History / Subjective:  Sitting on edge of bed with no acute complaints   Objective   Blood pressure 134/76, pulse (!) 58, temperature (!) 97.5 F (36.4 C), temperature source Oral, resp. rate 17, height 6' (1.829 m), weight 100.9 kg, SpO2 98 %.        Intake/Output Summary (Last 24 hours) at 10/18/2020 1423 Last data filed at 10/18/2020 0835 Gross per 24 hour  Intake 343.03 ml  Output 925 ml  Net -581.97 ml   Filed Weights   10/16/20 2212 10/17/20 1749  Weight: 97.5 kg 100.9 kg    Examination: General: Very pleasant elderly gentleman sitting up on edge of bed in NAD HEENT: [place]/AT, MM pink/moist, PERRL,  Neuro: Alert and oriented x3, non-focal  CV: s1s2 regular rate and rhythm, no murmur, rubs, or gallops,  PULM:  Clear to  Ascultation bilaterally, oxygen saturations appropriate on RA GI: soft, bowel sounds  active in all 4 quadrants, non-tender, non-distended, tolerating TF Extremities: warm/dry, no edema  Skin: no rashes or lesions  Resolved Hospital Problem list      Assessment & Plan:  Chronic arcoidosis -Extensive upper lobe prominence lung disease compatible with chronic sarcoidosis seen on CTA chest. First diagnoses 196 Acute pulmonary embolism -Single small focus of pulmonary embolism involving a subsegmental branch of the superior segment of the left upper lobe seen on CTA chest ? Pulmonary hypertension -Seen on echocardiogram and CTA chest P: Continue heparin drip No need for treatment for sarcoidosis at this time  Patient will need to follow up with pulmonologist as an outpatient  Continue home BDs Mobilize as able   Best practice  Per primary    Labs   CBC: Recent Labs  Lab 10/16/20 2215 10/17/20 0448 10/18/20 0536  WBC 11.3* 14.4* 11.2*  HGB 12.9* 12.9* 12.0*  HCT 39.8 40.0 38.2*  MCV 86.5 87.5 89.5  PLT 311 313 161    Basic Metabolic Panel: Recent Labs  Lab 10/16/20 2215 10/17/20 0448 10/18/20 0536  NA 133* 135 139  K 4.0 3.8 3.6  CL 99 104 104  CO2 21* 21* 24  GLUCOSE 306* 189* 113*  BUN 17 18 21   CREATININE 1.07 0.83 0.95  CALCIUM 9.0 9.1 9.1  MG  --  2.2  --   PHOS  --  3.1  --    GFR: Estimated Creatinine Clearance: 83.9 mL/min (by C-G formula based on SCr of 0.95 mg/dL). Recent Labs  Lab 10/16/20 2215 10/17/20 0448 10/18/20 0536  WBC 11.3* 14.4* 11.2*    Liver Function Tests: Recent Labs  Lab 10/16/20 2215 10/17/20 0448 10/18/20 0536  AST 26 19 19   ALT 12 14 17   ALKPHOS 120 110 71  BILITOT 0.8 0.6 0.7  PROT 7.8 7.6 6.6  ALBUMIN 4.0 3.9 3.6   Recent Labs  Lab 10/16/20 2215  LIPASE 37   No results for input(s): AMMONIA in the last 168 hours.  ABG No results found for: PHART, PCO2ART, PO2ART, HCO3, TCO2, ACIDBASEDEF, O2SAT   Coagulation Profile: Recent Labs  Lab 10/17/20 0448  INR 1.1    Cardiac Enzymes: No results for input(s): CKTOTAL, CKMB, CKMBINDEX, TROPONINI in the last 168 hours.  HbA1C: Hgb A1c MFr Bld  Date/Time Value Ref Range Status  10/16/2020 10:26 PM  6.2 (H) 4.8 - 5.6 % Final    Comment:    (NOTE) Pre diabetes:          5.7%-6.4%  Diabetes:              >6.4%  Glycemic control for   <7.0% adults with diabetes     CBG: Recent Labs  Lab 10/17/20 1640 10/17/20 2129 10/18/20 0740 10/18/20 1127  GLUCAP 126* 115* 90 111*    Review of Systems:   Gen: Denies fever, chills, weight change, fatigue, night sweats HEENT: Denies blurred vision, double vision, hearing loss, tinnitus, sinus congestion, rhinorrhea, sore throat, neck stiffness, dysphagia PULM: Denies shortness of breath with exertion, cough, sputum production, hemoptysis, wheezing CV: Denies chest pain, edema, orthopnea, paroxysmal nocturnal dyspnea, palpitations GI: Denies abdominal pain, nausea, vomiting, diarrhea, hematochezia, melena, constipation, change in bowel habits GU: Denies dysuria, hematuria, polyuria, oliguria, urethral discharge Endocrine: Denies hot or cold intolerance, polyuria, polyphagia or appetite change Derm: Denies rash, dry skin, scaling or peeling skin change Heme: Denies easy bruising, bleeding, bleeding gums Neuro: Denies headache, numbness, weakness, slurred speech, loss of memory or consciousness  Past  Medical History:  He,  has a past medical history of Arthritis, Asthma, Coronary atherosclerosis of native coronary artery, GERD (gastroesophageal reflux disease), History of kidney stones, Mixed hyperlipidemia, Obstructive sleep apnea, Pulmonary hypertension ([place]), Restless leg syndrome, Sarcoidosis, Statin intolerance, and Ulcerative colitis ([place]).   Surgical History:   Past Surgical History:  Procedure Laterality Date  . Ankle fracture repair Left 1985  . CARDIAC CATHETERIZATION     stents x4  . CHOLECYSTECTOMY  1970  . CYSTOSCOPY WITH INSERTION OF UROLIFT N/A 08/27/2017   Procedure: CYSTOSCOPY WITH INSERTION OF UROLIFT;  Surgeon: Cleon Gustin, MD;  Location: [facility];  Service: Urology;  Laterality: N/A;  . LAPAROSCOPIC APPENDECTOMY   2013  . LEFT HEART CATH AND CORONARY ANGIOGRAPHY N/A 01/02/2018   Procedure: LEFT HEART CATH AND CORONARY ANGIOGRAPHY;  Surgeon: Troy Sine, MD;  Location: [facility];  Service: Cardiovascular;  Laterality: N/A;  . Right total knee replacement Bilateral 2005     Social History:   reports that he has never smoked. He has never used smokeless tobacco. He reports that he does not drink alcohol and does not use drugs.   Family History:  His family history includes CAD in his father; Colon cancer in his mother.   Allergies Allergies  Allergen Reactions  . Bee Venom Anaphylaxis  . Statins Other (See Comments)    Myalgias - Simvastatin and Rosuvastatin     Home Medications  Prior to Admission medications   Medication Sig Start Date End Date Taking? Authorizing Provider  amLODipine (NORVASC) 10 MG tablet Take 10 mg by mouth daily. 09/21/20  Yes [provider]  aspirin EC 81 MG tablet Take 81 mg by mouth daily.   Yes [provider]  BREO ELLIPTA 200-25 MCG/INH AEPB Inhale 1 Inhaler into the lungs daily. 12/25/17  Yes [provider]  carbidopa-levodopa (SINEMET IR) 10-100 MG per tablet Take 1 tablet by mouth every evening.   Yes [provider]  cyanocobalamin (,VITAMIN B-12,) 1000 MCG/ML injection Inject 1,000 mcg into the muscle every 30 (thirty) days. 08/10/20  Yes [provider]  Dupilumab 300 MG/2ML SOPN Inject 300 mg into the skin every 14 (fourteen) days.   Yes [provider]  Evolocumab (REPATHA SURECLICK) 628 MG/ML SOAJ Inject 140 mg into the skin every 14 (fourteen) days.   Yes [provider]  finasteride (PROSCAR) 5 MG tablet Take 1 tablet (5 mg total) by mouth daily. 09/22/20  Yes McKenzie, Candee Furbish, MD  folic acid (FOLVITE) 1 MG tablet Take 1 mg by mouth See admin instructions. Mon - Fri   Yes [provider]  hydrochlorothiazide (HYDRODIURIL) 25 MG tablet Take 25 mg by mouth daily.   Yes [provider]  HYDROcodone-acetaminophen (NORCO/VICODIN) 5-325 MG tablet Take 1 tablet by mouth every 6 (six) hours as needed for severe pain. 08/06/20  Yes [provider]  isosorbide mononitrate (IMDUR) 30 MG 24 hr tablet TAKE 1 TABLET BY MOUTH  DAILY Patient taking differently: Take 30 mg by mouth daily. 02/24/20  Yes Satira Sark, MD  LORazepam (ATIVAN) 0.5 MG tablet Take 0.5 mg by mouth daily as needed for anxiety. 09/10/20  Yes [provider]  losartan (COZAAR) 50 MG tablet Take 50 mg by mouth daily. 08/03/20  Yes [provider]  metoprolol tartrate (LOPRESSOR) 25 MG tablet Take 12.5 mg by mouth 2 (two) times daily.   Yes [provider]  montelukast (SINGULAIR) 10 MG tablet Take 10  mg by mouth at bedtime.   Yes [provider]  nitroGLYCERIN (NITROSTAT) 0.4 MG SL tablet Place 1 tablet (0.4 mg total) under the tongue every 5 (five) minutes x 3 doses as needed for chest pain (if no relief after 3rd dose, proceed to the ED for an evaluation or call 911). 06/26/19  Yes Satira Sark, MD  omeprazole (PRILOSEC) 20 MG capsule Take 20 mg by mouth daily as needed (acid reflux). 07/13/20  Yes [provider]  penicillin v potassium (VEETID) 500 MG tablet Take by mouth See admin instructions. TAKE 2 TABLETS BY MOUTH STAT THEN 1 TABLET FOUR TIMES DAILY FOR 7 DAYS 10/14/20  Yes [provider]  temazepam (RESTORIL) 30 MG capsule Take 30 mg by mouth at bedtime as needed for sleep.    Yes [provider]  Vitamin D, Ergocalciferol, (DRISDOL) 50000 UNITS CAPS capsule Take 50,000 Units by mouth every 7 (seven) days. Thursdays   Yes [provider]  potassium chloride (KLOR-CON) 10 MEQ tablet Take 1 tablet (10 mEq total) by mouth daily. 07/12/20 10/10/20  Satira Sark, MD  sulfaSALAzine (AZULFIDINE) 500 MG tablet Take 1,000 mg by mouth 2 (two) times daily.    [provider]     Signature:  Johnsie Cancel,  NP-C [facility] Personal contact information can be found on Amion  If no response please page: Adult pulmonary and critical care medicine pager on Amion unitl 7pm After 7pm please call [phone] 10/18/2020, 3:38 PM

## 2020-10-18 NOTE — Progress Notes (Signed)
Bed ready at [facility]. [facility] called to request transport. Transport stated will be here as soon as they can. Will continue to monitor. Pt updated on changes.

## 2020-10-18 NOTE — Consult Note (Addendum)
Cardiology Consultation:   Patient ID: Richard Webb MRN: [id number]; DOB: 03/24/1946  Admit date: 10/16/2020 Date of Consult: 10/18/2020  PCP:  [name], [place]  Cardiologist:  Rozann Lesches, MD  Advanced Practice Provider:  No care team member to display Electrophysiologist:  None   :[id number]}    Patient Profile:   Richard Webb is a 75 y.o. male with a hx of CAD who is being seen today for the evaluation of chest pain at the request of Dr. Denton Brick.  History of Present Illness:   Richard Webb is a 75 yo male patient with history of CAD DES x 3 RCA and DES Cfx 05/2010 [place], residual 50-70% dCfx in AV groove, 70% dLAD, 70% small D1, similar results on cath 2019, patent stents, medical therapy recommended. Also has HTN, HLD-statin intolerance, Pulmonary HTN, sarcoidosis.  Last saw Dr. Domenic Polite 06/2020 and doing well. Echo 12/21 normal LVEF 60-65% mild dd. Is scheduled for partial colectomy for diverticulitis 10/29/20  Patient now admited with chest pain, elevated troponins 137, 178, 207, 196, EKG's without acute change.CTA yest with single small focus of pulmonary embolism LL lobe? Significance in regards to patient's symptoms. No central PE. Echo 10/17/20 LVEF 70-75% hyperdynamic, mild LVH, Grade 1DD. Patient's wife died recently and he hasn't been feeling well. Chronic dyspnea on exertion that is unchanged. 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 Fri he had worsening SOB and after eating at a friends he developed chest tightness into left shoulder associated with bloating in his stomach. He took an extra Imdur rather than NTG. Pain free since he's been here. Has exertional angina about once a week relieved with rest.   Past Medical History:  Diagnosis Date  . Arthritis   . Asthma   . Coronary atherosclerosis of native coronary artery    a. DES x 3 RCA and DES mCx 10/11 -  [place] - with residual 50-70% stenosis in the distal Cx in AV groove, 70% distal LAD beyond apex, 70% small D1. b. 12/2017: similar results with multivessel CAD. Patent stents. Medical management recommended.   Marland Kitchen GERD (gastroesophageal reflux disease)   . History of kidney stones   . Mixed hyperlipidemia   . Obstructive sleep apnea   . Pulmonary hypertension (HCC)    PASP 50 mmHg  . Restless leg syndrome   . Sarcoidosis   . Statin intolerance   . Ulcerative colitis [facility])     Past Surgical History:  Procedure Laterality Date  . Ankle fracture repair Left 1985  . CARDIAC CATHETERIZATION     stents x4  . CHOLECYSTECTOMY  1970  . CYSTOSCOPY WITH INSERTION OF UROLIFT N/A 08/27/2017   Procedure: CYSTOSCOPY WITH INSERTION OF UROLIFT;  Surgeon: Cleon Gustin, MD;  Location: [facility];  Service: Urology;  Laterality: N/A;  . LAPAROSCOPIC APPENDECTOMY  2013  . LEFT HEART CATH AND CORONARY ANGIOGRAPHY N/A 01/02/2018   Procedure: LEFT HEART CATH AND CORONARY ANGIOGRAPHY;  Surgeon: Troy Sine, MD;  Location: [place] [facility];  Service: Cardiovascular;  Laterality: N/A;  . Right total knee replacement Bilateral 2005     Home Medications:  Prior to Admission medications   Medication Sig Start Date End Date Taking? Authorizing Provider  amLODipine (NORVASC) 10 MG tablet Take 10 mg by mouth daily. 09/21/20  Yes [provider]  aspirin EC 81 MG tablet Take 81 mg by mouth daily.  Yes [provider]  BREO ELLIPTA 200-25 MCG/INH AEPB Inhale 1 Inhaler into the lungs daily. 12/25/17  Yes [provider]  carbidopa-levodopa (SINEMET IR) 10-100 MG per tablet Take 1 tablet by mouth every evening.   Yes [provider]  cyanocobalamin (,VITAMIN B-12,) 1000 MCG/ML injection Inject 1,000 mcg into the muscle every 30 (thirty) days. 08/10/20  Yes [provider]  Dupilumab 300 MG/2ML SOPN Inject 300 mg into the skin every 14 (fourteen) days.   Yes [provider]  Evolocumab (REPATHA SURECLICK) 562 MG/ML SOAJ Inject 140 mg into the skin every 14 (fourteen) days.   Yes [provider]  finasteride (PROSCAR) 5 MG tablet Take 1 tablet (5 mg total) by mouth daily. 09/22/20  Yes McKenzie, Candee Furbish, MD  folic acid (FOLVITE) 1 MG tablet Take 1 mg by mouth See admin instructions. Mon - Fri   Yes [provider]  hydrochlorothiazide (HYDRODIURIL) 25 MG tablet Take 25 mg by mouth daily.   Yes [provider]  HYDROcodone-acetaminophen (NORCO/VICODIN) 5-325 MG tablet Take 1 tablet by mouth every 6 (six) hours as needed for severe pain. 08/06/20  Yes [provider]  isosorbide mononitrate (IMDUR) 30 MG 24 hr tablet TAKE 1 TABLET BY MOUTH  DAILY Patient taking differently: Take 30 mg by mouth daily. 02/24/20  Yes Satira Sark, MD  LORazepam (ATIVAN) 0.5 MG tablet Take 0.5 mg by mouth daily as needed for anxiety. 09/10/20  Yes [provider]  losartan (COZAAR) 50 MG tablet Take 50 mg by mouth daily. 08/03/20  Yes [provider]  metoprolol tartrate (LOPRESSOR) 25 MG tablet Take 12.5 mg by mouth 2 (two) times daily.   Yes [provider]  montelukast (SINGULAIR) 10 MG tablet Take 10 mg by mouth at bedtime.   Yes [provider]  nitroGLYCERIN (NITROSTAT) 0.4 MG SL tablet Place 1 tablet (0.4 mg total) under the tongue every 5 (five) minutes x 3 doses as needed for chest pain (if no relief after 3rd dose, proceed to the ED for an evaluation or call 911). 06/26/19  Yes Satira Sark, MD  omeprazole (PRILOSEC) 20 MG capsule Take 20 mg by mouth daily as needed (acid reflux). 07/13/20  Yes [provider]  penicillin v potassium (VEETID) 500 MG tablet Take by mouth See admin instructions. TAKE 2 TABLETS BY MOUTH STAT THEN 1 TABLET FOUR TIMES DAILY FOR 7 DAYS 10/14/20  Yes [provider]  temazepam (RESTORIL) 30 MG capsule Take 30 mg by mouth at bedtime as needed for  sleep.    Yes [provider]  Vitamin D, Ergocalciferol, (DRISDOL) 50000 UNITS CAPS capsule Take 50,000 Units by mouth every 7 (seven) days. Thursdays   Yes [provider]  potassium chloride (KLOR-CON) 10 MEQ tablet Take 1 tablet (10 mEq total) by mouth daily. 07/12/20 10/10/20  Satira Sark, MD  sulfaSALAzine (AZULFIDINE) 500 MG tablet Take 1,000 mg by mouth 2 (two) times daily.    [provider]    Inpatient Medications: Scheduled Meds: . aspirin EC  81 mg Oral Daily  . atorvastatin  20 mg Oral q1800  . carbidopa-levodopa  1 tablet Oral QHS  . Chlorhexidine Gluconate Cloth  6 each Topical Daily  . clopidogrel  75 mg Oral Daily  . finasteride  5 mg Oral Daily  . fluticasone furoate-vilanterol  1 puff Inhalation Daily  . insulin aspart  0-5 Units Subcutaneous QHS  . insulin aspart  0-9 Units Subcutaneous  TID WC  . isosorbide mononitrate  30 mg Oral Daily  . losartan  50 mg Oral Daily  . metoprolol tartrate  25 mg Oral BID  . montelukast  10 mg Oral QHS  . pantoprazole  40 mg Oral BID   Continuous Infusions: . heparin 1,200 Units/hr (10/18/20 0621)   PRN Meds: nitroGLYCERIN, temazepam  Allergies:    Allergies  Allergen Reactions  . Bee Venom Anaphylaxis  . Statins Other (See Comments)    Myalgias - Simvastatin and Rosuvastatin    Social History:   Social History   Socioeconomic History  . Marital status: Married    Spouse name: Not on file  . Number of children: Not on file  . Years of education: Not on file  . Highest education level: Not on file  Occupational History  . Occupation: Pastor  Tobacco Use  . Smoking status: Never Smoker  . Smokeless tobacco: Never Used  Vaping Use  . Vaping Use: Never used  Substance and Sexual Activity  . Alcohol use: No  . Drug use: No  . Sexual activity: Yes    Birth control/protection: None  Other Topics Concern  . Not on file  Social History Narrative  . Not on file   Social  Determinants of Health   Financial Resource Strain: Not on file  Food Insecurity: Not on file  Transportation Needs: Not on file  Physical Activity: Not on file  Stress: Not on file  Social Connections: Not on file  Intimate Partner Violence: Not on file    Family History:     Family History  Problem Relation Age of Onset  . Colon cancer Mother   . CAD Father      ROS:  Please see the history of present illness.  Review of Systems  Constitutional: Positive for malaise/fatigue.  HENT: Negative.   Cardiovascular: Positive for chest pain and dyspnea on exertion.  Respiratory: Positive for shortness of breath.   Endocrine: Negative.   Hematologic/Lymphatic: Negative.   Musculoskeletal: Negative.   Gastrointestinal: Positive for bloating.  Genitourinary: Negative.   Neurological: Negative.     All other ROS reviewed and negative.     Physical Exam/Data:   Vitals:   10/18/20 0500 10/18/20 0737 10/18/20 0800 10/18/20 0807  BP: (!) 147/89  (!) 143/70   Pulse: 71  63   Resp: 13  19   Temp:  97.8 F (36.6 C)    TempSrc:  Oral    SpO2: 98%  97% 99%  Weight:      Height:        Intake/Output Summary (Last 24 hours) at 10/18/2020 0950 Last data filed at 10/18/2020 0835 Gross per 24 hour  Intake 343.03 ml  Output 925 ml  Net -581.97 ml   Last 3 Weights 10/17/2020 10/16/2020 09/22/2020  Weight (lbs) 222 lb 7.1 oz 214 lb 15.2 oz 215 lb  Weight (kg) 100.9 kg 97.5 kg 97.523 kg     Body mass index is 30.17 kg/m.  General:  Well nourished, well developed, in no acute distress HEENT: normal Lymph: no adenopathy Neck: no JVD Endocrine:  No thryomegaly Vascular: No carotid bruits; FA pulses 2+ bilaterally without bruits  Cardiac:  normal S1, S2; RRR; no murmur  Lungs:  clear to auscultation bilaterally, no wheezing, rhonchi or rales  Abd: soft, nontender, no hepatomegaly  Ext: no edema Musculoskeletal:  No deformities, BUE and BLE strength normal and equal Skin: warm and  dry  Neuro:  CNs 2-12  intact, no focal abnormalities noted Psych:  Normal affect   EKG:  The EKG was personally reviewed and demonstrates:  Sinus brady 55 with first degree AV block,  Telemetry:  Telemetry was personally reviewed and demonstrates:  NSR with NSVT but looks like artifact- was up using bathroom, asymptomatic  Relevant CV Studies: Cardiac catheterization 2018/01/12:  Previously placed Dist RCA stent (unknown type) is widely patent.  Previously placed Ost RCA to Prox RCA stent (unknown type) is widely patent.  Prox RCA to Mid RCA lesion is 60% stenosed.  Mid RCA lesion is 70% stenosed.  Acute Mrg lesion is 95% stenosed.  Post Atrio lesion is 60% stenosed.  Ost RPDA lesion is 70% stenosed.  Previously placed Prox Cx stent (unknown type) is widely patent.  Mid Cx to Dist Cx lesion is 60% stenosed.  Ost 3rd Mrg to 3rd Mrg lesion is 50% stenosed.  Ost 2nd Diag to 2nd Diag lesion is 70% stenosed.  Mid LAD lesion is 70% stenosed.  Dist LAD lesion is 30% stenosed.  Prox LAD lesion is 55% stenosed.  The left ventricular ejection fraction is greater than 65% by visual estimate.  There is hyperdynamic left ventricular systolic function.  LV end diastolic pressure is normal.   Hyperdynamic LV function with a "spade-like ventricle "with an EF of at least 75%.   Multivessel CAD with 55% proximal LAD stenosis after the first small first diagonal vessel, with diffuse 70% bifurcation stenoses involving the mid LAD and second diagonal vessel and 30% distal apical diffuse stenoses; patent left circumflex stent in the AV groove between the first small marginal Novalie Leamy and second large marginal vessel.  There is diffuse 50 to 60% stenoses in the distal AV groove circumflex and bifurcating OM 3 vessel; large dominant RCA with patent proximal stent, diffuse 60 to 70% mid stenoses with 95% ostial stenosis and a small RV marginal Lanecia Sliva and patent distal RCA stent proximal to the PDA  takeoff with 60 to 70% stenoses at the origin of the PDA and continuation Darvin Dials beyond the PDA vessel.   Echocardiogram 10/23/2017: Study Conclusions   - Left ventricle: The cavity size was normal. Wall thickness was    increased increased in a pattern of mild to moderate LVH.    Systolic function was normal. The estimated ejection fraction was    in the range of 55% to 60%. Wall motion was normal; there were no    regional wall motion abnormalities. Doppler parameters are    consistent with abnormal left ventricular relaxation (grade 1    diastolic dysfunction).  - Aortic valve: Mildly calcified annulus. Trileaflet; mildly    thickened leaflets. Valve area (VTI): 2.54 cm^2. Valve area    (Vmax): 2.26 cm^2.  - Mitral valve: Mildly calcified annulus. Normal thickness leaflets    .  - Technically adequate study.      Laboratory Data:  High Sensitivity Troponin:   Recent Labs  Lab 10/17/20 0236 10/17/20 0448 10/17/20 0729 10/17/20 0923 10/17/20 1138  TROPONINIHS 66* 137* 178* 207* 196*     Chemistry Recent Labs  Lab 10/16/20 2215 10/17/20 0448 10/18/20 0536  NA 133* 135 139  K 4.0 3.8 3.6  CL 99 104 104  CO2 21* 21* 24  GLUCOSE 306* 189* 113*  BUN 17 18 21   CREATININE 1.07 0.83 0.95  CALCIUM 9.0 9.1 9.1  GFRNONAA >60 >60 >60  ANIONGAP 13 10 11     Recent Labs  Lab 10/16/20 2215 10/17/20 0448 10/18/20 0536  PROT 7.8 7.6 6.6  ALBUMIN 4.0 3.9 3.6  AST 26 19 19   ALT 12 14 17   ALKPHOS 120 110 71  BILITOT 0.8 0.6 0.7   Hematology Recent Labs  Lab 10/16/20 2215 10/17/20 0448 10/18/20 0536  WBC 11.3* 14.4* 11.2*  RBC 4.60 4.57 4.27  HGB 12.9* 12.9* 12.0*  HCT 39.8 40.0 38.2*  MCV 86.5 87.5 89.5  MCH 28.0 28.2 28.1  MCHC 32.4 32.3 31.4  RDW 15.3 15.4 15.9*  PLT 311 313 300   BNPNo results for input(s): BNP, PROBNP in the last 168 hours.  DDimer No results for input(s): DDIMER in the last 168 hours.   Radiology/Studies:  CT ANGIO CHEST PE W OR WO  CONTRAST  Result Date: 10/17/2020 CLINICAL DATA:  Onset of chest pain and shortness of breath last night. Abnormal chest x-ray. History of sarcoidosis, asthma and hypertension. Clinical suspicion of pulmonary embolism. EXAM: CT ANGIOGRAPHY CHEST WITH CONTRAST TECHNIQUE: Multidetector CT imaging of the chest was performed using the standard protocol during bolus administration of intravenous contrast. Multiplanar CT image reconstructions and MIPs were obtained to evaluate the vascular anatomy. CONTRAST:  135m OMNIPAQUE IOHEXOL 350 MG/ML SOLN COMPARISON:  Chest radiographs 10/16/2020. Abdominal CT 02/15/2020 and 04/17/2020. FINDINGS: Cardiovascular: The pulmonary arteries are well opacified with contrast to the level of the subsegmental branches. There is a single occluded subsegmental Ahmir Bracken in the superior segment of the left lower lobe (images 28 through 34 of series 4), consistent with pulmonary embolism. The pulmonary arteries are otherwise patent, and there are no central pulmonary emboli. There is central enlargement of the pulmonary arteries consistent with pulmonary arterial hypertension. There is moderate atherosclerosis of the aorta, great vessels and coronary arteries. The heart size is normal. There is no pericardial effusion. Mediastinum/Nodes: There are no enlarged mediastinal, hilar or axillary lymph nodes.There are scattered small partially calcified mediastinal and hilar lymph nodes bilaterally. The thyroid gland, trachea and esophagus demonstrate no significant findings. Lungs/Pleura: There is no pleural effusion or pneumothorax. There is extensive upper lobe predominant chronic lung disease with thickening of the bronchovascular bundles, peribronchovascular scarring and traction bronchiectasis, compatible with chronic sarcoidosis. The basilar components have progressed compared with the prior abdominal CT. Scattered mucous plugging is noted. There is no confluent airspace opacity or suspicious  pulmonary nodule. Upper abdomen: Nodular parents of the liver again noted suspicious for cirrhosis. No adrenal mass. Musculoskeletal/Chest wall: There is no chest wall mass or suspicious osseous finding. Review of the MIP images confirms the above findings. IMPRESSION: 1. Single small focus of pulmonary embolism involving a subsegmental Tarae Wooden of the superior segment of the left lower lobe. This is of questionable significance regarding the patient's symptoms. No central pulmonary emboli. Consider lower extremity venous doppler ultrasound. 2. Central enlargement of the pulmonary arteries consistent with pulmonary arterial hypertension. 3. 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 The basilar components have progressed compared with the prior abdominal CT and superimposed inflammation/infection is not completely excluded. No evidence of neoplasm. 4. Aortic Atherosclerosis (ICD10-I70.0). 5. Critical Value/emergent results were called by telephone at the time of interpretation on 10/17/2020 at 4:16 pm to provider MAletta Edouard who verbally acknowledged these results. Electronically Signed   By: WRichardean SaleM.D.   On: 10/17/2020 16:17   DG Chest Port 1 View  Result Date: 10/16/2020 CLINICAL DATA:  Chest pain. EXAM: PORTABLE CHEST 1 VIEW COMPARISON:  None. FINDINGS: The heart size and mediastinal contours are within normal limits. No pneumothorax or pleural  effusion is noted. Bilateral upper lobe opacities are noted which may represent pneumonia or possibly edema, but neoplasm cannot be excluded. The visualized skeletal structures are unremarkable. IMPRESSION: Bilateral upper lobe opacities are noted which may represent pneumonia or possibly edema, but neoplasm cannot be excluded. CT scan of the chest is recommended for further evaluation. Electronically Signed   By: Marijo Conception M.D.   On: 10/16/2020 22:45   ECHOCARDIOGRAM COMPLETE  Result Date: 10/17/2020     ECHOCARDIOGRAM REPORT   Patient Name:   ENOCH MOFFA Date of Exam: 10/17/2020 Medical Rec #:  [id number]        Height:       72.0 in Accession #:    [id number]       Weight:       214.9 lb Date of Birth:  [birth year]        BSA:          2.197 m Patient Age:    11 years         BP:           119/72 mmHg Patient Gender: M                HR:           60 bpm. Exam Location:  Inpatient Procedure: 2D Echo, Cardiac Doppler and Color Doppler Indications:    Dyspnea R06.00  History:        Patient has no prior history of Echocardiogram examinations and                 Patient has prior history of Echocardiogram examinations, most                 recent 08/10/2020. CAD, Pulmonary HTN; Risk Factors:Dyslipidemia                 and Sleep Apnea. Sarcoidosis. GERD. Asthma.  Sonographer:    Darlina Sicilian RDCS Referring Phys: Lewis  1. Left ventricular ejection fraction, by estimation, is 70 to 75%. The left ventricle has hyperdynamic function. The left ventricle has no regional wall motion abnormalities. There is mild left ventricular hypertrophy of the basal-septal segment. Left ventricular diastolic parameters are consistent with Grade I diastolic dysfunction (impaired relaxation).  2. Right ventricular systolic function is normal. The right ventricular size is normal.  3. The mitral valve is normal in structure. Trivial mitral valve regurgitation. No evidence of mitral stenosis.  4. The aortic valve is tricuspid. Aortic valve regurgitation is not visualized. Mild aortic valve sclerosis is present, with no evidence of aortic valve stenosis. FINDINGS  Left Ventricle: Left ventricular ejection fraction, by estimation, is 70 to 75%. The left ventricle has hyperdynamic function. The left ventricle has no regional wall motion abnormalities. The left ventricular internal cavity size was normal in size. There is mild left ventricular hypertrophy of the basal-septal segment. Left ventricular diastolic  parameters are consistent with Grade I diastolic dysfunction (impaired relaxation). Right Ventricle: The right ventricular size is normal. Right ventricular systolic function is normal. Left Atrium: Left atrial size was normal in size. Right Atrium: Right atrial size was normal in size. Pericardium: There is no evidence of pericardial effusion. Mitral Valve: The mitral valve is normal in structure. Mild mitral annular calcification. Trivial mitral valve regurgitation. No evidence of mitral valve stenosis. Tricuspid Valve: The tricuspid valve is normal in structure. Tricuspid valve regurgitation is trivial. No evidence of tricuspid stenosis. Aortic Valve: The aortic valve is  tricuspid. Aortic valve regurgitation is not visualized. Mild aortic valve sclerosis is present, with no evidence of aortic valve stenosis. Pulmonic Valve: The pulmonic valve was normal in structure. Pulmonic valve regurgitation is trivial. No evidence of pulmonic stenosis. Aorta: The aortic root is normal in size and structure. Venous: The inferior vena cava was not well visualized. IAS/Shunts: The interatrial septum was not well visualized.  LEFT VENTRICLE PLAX 2D LVIDd:         4.44 cm  Diastology LVIDs:         2.35 cm  LV e' medial:    5.33 cm/s LV PW:         0.89 cm  LV E/e' medial:  13.6 LV IVS:        1.38 cm  LV e' lateral:   5.44 cm/s LVOT diam:     2.30 cm  LV E/e' lateral: 13.3 LV SV:         110 LV SV Index:   50 LVOT Area:     4.15 cm                          3D Volume EF:                         3D EF:        54 %                         LV EDV:       130 ml                         LV ESV:       59 ml                         LV SV:        70 ml RIGHT VENTRICLE RV S prime:     9.03 cm/s TAPSE (M-mode): 2.0 cm LEFT ATRIUM             Index       RIGHT ATRIUM           Index LA diam:        3.80 cm 1.73 cm/m  RA Area:     10.90 cm LA Vol (A2C):   45.6 ml 20.76 ml/m RA Volume:   18.90 ml  8.60 ml/m LA Vol (A4C):   46.7 ml 21.26  ml/m LA Biplane Vol: 47.1 ml 21.44 ml/m  AORTIC VALVE LVOT Vmax:   115.00 cm/s LVOT Vmean:  87.500 cm/s LVOT VTI:    0.265 m  AORTA Ao Root diam: 3.70 cm Ao Asc diam:  3.40 cm MITRAL VALVE MV Area (PHT): 2.01 cm    SHUNTS MV Decel Time: 377 msec    Systemic VTI:  0.26 m MV E velocity: 72.40 cm/s  Systemic Diam: 2.30 cm MV A velocity: 81.80 cm/s MV E/A ratio:  0.89 Kirk Ruths MD Electronically signed by Kirk Ruths MD Signature Date/Time: 10/17/2020/12:12:14 PM    Final      Assessment and Plan:   Chest pain consistent with angina, with elvated troponins up 204, no EKG changes, single small focus PE on CTA, echo with hyperdynamic LVEF 70-75% no WMA. 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 Currently on IV heparin and no further chest pain. Also scheduled for  partial colectomy for diverticulitis 10/29/20. Will discuss options with Dr. Harl Bowie including possible cath vs medical therapy.  CAD DES x 3 RCA and DES Cfx 05/2010 [place], residual 50-70% dCfx in AV groove, 70% dLAD, 70% small D1, similar results on cath 2019, patent stents, medical therapy recommended.  HTN controlled on losartan, HCTZ, metoprolol, Imdur  HLD statin intolerance on repatha  Sarcoidosis followed by pulmonary  Schedule for partial colectomy 10/29/20    Risk Assessment/Risk Scores:     TIMI Risk Score for Unstable Angina or Non-ST Elevation MI:   The patient's TIMI risk score is 6, which indicates a 41% risk of all cause mortality, new or recurrent myocardial infarction or need for urgent revascularization in the next 14 days.          For questions or updates, please contact [place] Please consult [email or web address] for contact info under    Signed, Ermalinda Barrios, PA-C  10/18/2020 9:50 AM   Attending note Patient seen and discussed with PA Bonnell Public, I agree with her documentation. 75 yo male history of CAD with prior stenting as reported above, HL, OSA, sarcoid admitted with chest pain and SOB  Descirbes a  tightnes midchest chest and associated pain left shoulder lasting about 1 hour. Reports symptoms similar to his angina before prior stents. Chronic DOE with some recent progression.    K 4 Cr 1.07 BUN 17 WBC 11.3 Plt 311 Hgb 12.9  Trop 8-->29-->66-->137-->178-->207-->196 COVID neg EKG SR, no acute ischemic changes CXR bilateral upper lobe opacities, pneumonia vs edema cannot exclude neoplasm CT PE single small focus of pulmonary embolism subsegmentla Colandra Ohanian LUL, questionable significance Extensive upper lobe disease compatible with sarcoid Echo LVEF 70-75%, no WMAs, grade I dd, normal RV  12/2017 cath: prox LAD 55%, mid LAD 70%, D2 70%, LCx patent prox stent, mid LCX 60%, OM3 50%, RCA patent stent prox, mid 60-70%, acute marginal 95%, RPDA 70%   Presents with chest pain typical for angina, identical to his prior symptoms when he received stents. Trop peak 207, trending down. Very tiny PE on CT scan would not account for his symptoms and trop rise. GIven his typical symptoms, known significnat disease, and trop elevation concern remains about possible ACS. Plan for transfer to [facility] with cath tomrrow. He is scheduled for partial colectomy in March for diverticulitis, I spoke with his surgeon Dr Leighton Ruff who is fine postponing that surgery as needed if patient is placed on DAPT. Prior hematuria followed by urology that is nearly resolved and I dont see being a limitation for cath at this time.     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    I have reviewed the risks, indications, and alternatives to cardiac catheterization, possible angioplasty, and stenting with the patient today. Risks include but are not limited to bleeding, infection, vascular injury, stroke, myocardial infection, arrhythmia, kidney injury, radiation-related injury in the case of prolonged fluoroscopy use, emergency cardiac surgery, and death.  The patient understands the risks of serious complication is 1-2 in 1975 with diagnostic cardiac cath and 1-2% or less with angioplasty/stenting.   Carlyle Dolly MD

## 2020-10-18 NOTE — Progress Notes (Signed)
[facility] for Heparin Indication: chest pain/ACS  Allergies  Allergen Reactions  . Bee Venom Anaphylaxis  . Statins Other (See Comments)    Myalgias - Simvastatin and Rosuvastatin    Patient Measurements: Height: 6' (182.9 cm) Weight: 100.9 kg (222 lb 7.1 oz) IBW/kg (Calculated) : 77.6 HEPARIN DW (KG): 98.2  Vital Signs: Temp: 97.8 F (36.6 C) (02/28 0737) Temp Source: Oral (02/28 0737) BP: 147/89 (02/28 0500) Pulse Rate: 71 (02/28 0500)  Labs: Recent Labs    10/16/20 2215 10/17/20 0033 10/17/20 0448 10/17/20 0729 10/17/20 0923 10/17/20 1138 10/17/20 1727 10/18/20 0536  HGB 12.9*  --  12.9*  --   --   --   --  12.0*  HCT 39.8  --  40.0  --   --   --   --  38.2*  PLT 311  --  313  --   --   --   --  300  APTT  --   --  29  --   --   --   --   --   LABPROT  --   --  13.4  --   --   --   --   --   INR  --   --  1.1  --   --   --   --   --   HEPARINUNFRC  --   --   --   --   --   --  0.39 0.38  CREATININE 1.07  --  0.83  --   --   --   --  0.95  TROPONINIHS 8   < > 137* 178* 207* 196*  --   --    < > = values in this interval not displayed.    Estimated Creatinine Clearance: 83.9 mL/min (by C-G formula based on SCr of 0.95 mg/dL).    Assessment: 75 y.o. M started on heparin gtt for increasing troponin. On heparin gtt for r/o ACS.  Heparin level therapeutic at 0.38  Goal of Therapy:  Heparin level 0.3-0.7 units/ml Monitor platelets by anticoagulation protocol: Yes   Plan:  Continue heparin infusion at 1200 units/hr  Will f/u daily heparin level  Margot Ables, PharmD Clinical Pharmacist 10/18/2020 7:55 AM

## 2020-10-18 NOTE — Progress Notes (Signed)
Pt house and car keys sent with [employer]. Pt son to pick them up from here.

## 2020-10-18 NOTE — Progress Notes (Signed)
Patient Demographics:    Richard Webb, is a 75 y.o. male, DOB - May 29, 1946, NPY:[id number]  Admit date - 10/16/2020   Admitting Physician Roxan Hockey, MD  Outpatient Primary MD for the patient is [name], MD  LOS - 1   Chief Complaint  Patient presents with  . Chest Pain        Subjective:    Richard Webb today has no fevers, no emesis,  No further  chest pain,   No Nausea, Vomiting or Diarrhea   Assessment  & Plan :    Principal Problem:   NSTEMI (non-ST elevated myocardial infarction) [facility]) Active Problems:   Chest pain   Sarcoidosis   Coronary artery disease   Elevated troponin   Leukocytosis   Essential hypertension   History of ulcerative colitis   GERD (gastroesophageal reflux disease)   Asthma   History of diverticulitis   Hyperglycemia  Brief Summary:- 74 y.o.malewith medical history significant forCADs/pfor stent placement, hypertension, ulcerative colitis, GERD, diverticulitis, obstructive sleep apnea, asthma/sarcoidosis, history of hematuria and 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 --- admitted on 10/17/2020 with chest pains and found to have troponin elevation and small Single small focus of pulmonary embolism involving a subsegmental branch of the superior segment of the left lower lobe---currently on iv Heparin--- -transferring to [facility] for possible LHC on 2/29/2022   A/p 1)H/o CAD--s/p Prior Stents/NSTEMI--- patient presented with chest pains and elevated troponin  --Troponin trend -8 > 29 >66 >137>>178>>207>196  -Echo from 10/17/2020 with EF of 70 to 75% without regional wall motion normalities, there is grade 1 diastolic dysfunction -c/n IV heparin without bolus--- watch for bleeding (prior history of hematuria and prior history of peptic ulcer disease with GI bleed) -Started isosorbide, metoprolol, Lipitor (was  on Repatha PTA)  -c/n  aspirin, and Plavix -EKG non-acute at this time -Currently chest pain-free --transferring to [facility] for possible LHC on 2/29/2022  2)Hyperglycemia--- no prior history of diabetes, -A1c 6.2 -Suspect stress related  -Use Novolog/Humalog Sliding scale insulin with Accu-Cheks/Fingersticks as ordered  3)Leukocytosis-- -No fevers, WBC is down to 11.2 from 14.4 -Leukocytosis is probably reactive in the setting of NSTEMI and pulmonary embolus -CTA chest without frank pneumonia or malignancy -CTA with left-sided PE -CTA chest with progressive pulmonary sarcoidosis  4)OSA--- continue CPAP nightly  5)Asthma and sarcoidosis--- -CTA chest shows progression of pulmonary sarcoidosis -Continue bronchodilators -Awaiting pulmonology consult  6)H/o PUD--with prior GI bleed, continue PPI especially with DAPT and IV heparin use  7)BPH--with hematuria in the past, continue Proscar watch for hematuria while on DAPT and IV heparin  8)Lt Sided PE-- small Single small focus of pulmonary embolism involving a subsegmental branch of the superior segment of the left lower lobe- ---CTA chest from 10/17/2020 noted -Continue IV heparin as above #1  Disposition/Need for in-Hospital Stay- patient unable to be discharged at this time due to -NSTEMI and left-sided PE requiring IV heparin, awaiting LHC  Status is: Inpatient  Remains inpatient appropriate because:Please see above  Disposition: The patient is from: Home              Anticipated d/c is to: Home  Anticipated d/c date is: 2 days              Patient currently is not medically stable to d/c. Barriers: Not Clinically Stable-   Code Status :   Code Status: Full Code   Family Communication:    NA (patient is alert, awake and coherent)   Consults  :  cardiology  DVT Prophylaxis  :   - SCDs  SCDs Start: 10/17/20 0434    Lab Results  Component Value Date   PLT 300 10/18/2020    Inpatient  Medications  Scheduled Meds: . aspirin EC  81 mg Oral Daily  . atorvastatin  20 mg Oral q1800  . carbidopa-levodopa  1 tablet Oral QHS  . Chlorhexidine Gluconate Cloth  6 each Topical Daily  . clopidogrel  75 mg Oral Daily  . finasteride  5 mg Oral Daily  . fluticasone furoate-vilanterol  1 puff Inhalation Daily  . insulin aspart  0-5 Units Subcutaneous QHS  . insulin aspart  0-9 Units Subcutaneous TID WC  . isosorbide mononitrate  30 mg Oral Daily  . losartan  50 mg Oral Daily  . metoprolol tartrate  25 mg Oral BID  . montelukast  10 mg Oral QHS  . pantoprazole  40 mg Oral BID   Continuous Infusions: . heparin 1,200 Units/hr (10/18/20 0621)   PRN Meds:.nitroGLYCERIN, temazepam    Anti-infectives (From admission, onward)   None        Objective:   Vitals:   10/18/20 0807 10/18/20 0900 10/18/20 1100 10/18/20 1129  BP:  (!) 160/66 139/70   Pulse:  66 (!) 59   Resp:  (!) 24 18   Temp:    (!) 97.5 F (36.4 C)  TempSrc:    Oral  SpO2: 99% 98% 97%   Weight:      Height:        Wt Readings from Last 3 Encounters:  10/17/20 100.9 kg  09/22/20 97.5 kg  07/12/20 96.2 kg     Intake/Output Summary (Last 24 hours) at 10/18/2020 1150 Last data filed at 10/18/2020 0835 Gross per 24 hour  Intake 343.03 ml  Output 925 ml  Net -581.97 ml   Physical Exam  Gen:- Awake Alert,  In no apparent distress  HEENT:- [place].AT, No sclera icterus Neck-Supple Neck,No JVD,.  Lungs-diminished breath sounds without wheezing CV- S1, S2 normal, regular  Abd-  +ve B.Sounds, Abd Soft, No tenderness,    Extremity/Skin:- No  edema, pedal pulses present  Psych-affect is appropriate, oriented x3 Neuro-no new focal deficits, no tremors   Data Review:   Micro Results Recent Results (from the past 240 hour(s))  SARS CORONAVIRUS 2 (TAT 6-24 HRS) Nasopharyngeal Nasopharyngeal Swab     Status: None   Collection Time: 10/17/20  4:00 AM   Specimen: Nasopharyngeal Swab  Result Value Ref Range  Status   SARS Coronavirus 2 NEGATIVE NEGATIVE Final    Comment: (NOTE) SARS-CoV-2 target nucleic acids are NOT DETECTED.  The SARS-CoV-2 RNA is generally detectable in upper and lower respiratory specimens during the acute phase of infection. Negative results do not preclude SARS-CoV-2 infection, do not rule out co-infections with other pathogens, and should not be used as the sole basis for treatment or other patient management decisions. Negative results must be combined with clinical observations, patient history, and epidemiological information. The expected result is Negative.  Fact Sheet for Patients: SugarRoll.be  Fact Sheet for Healthcare Providers: https://www.woods-mathews.com/  This test is not yet approved or  cleared by the [place] and  has been authorized for detection and/or diagnosis of SARS-CoV-2 by FDA under an Emergency Use Authorization (EUA). This EUA will remain  in effect (meaning this test can be used) for the duration of the COVID-19 declaration under Se ction 564(b)(1) of the Act, 21 U.S.C. section 360bbb-3(b)(1), unless the authorization is terminated or revoked sooner.  Performed at [facility], [place] [address] [place], [place] [address]   MRSA PCR Screening     Status: None   Collection Time: 10/17/20  5:48 PM   Specimen: Nasopharyngeal  Result Value Ref Range Status   MRSA by PCR NEGATIVE NEGATIVE Final    Comment:        The GeneXpert MRSA Assay (FDA approved for NASAL specimens only), is one component of a comprehensive MRSA colonization surveillance program. It is not intended to diagnose MRSA infection nor to guide or monitor treatment for MRSA infections. Performed at [facility], [address] [place], [place] [address]     Radiology Reports CT ANGIO CHEST PE W OR WO CONTRAST  Result Date: 10/17/2020 CLINICAL DATA:  Onset of chest pain and shortness of breath last night.  Abnormal chest x-ray. History of sarcoidosis, asthma and hypertension. Clinical suspicion of pulmonary embolism. EXAM: CT ANGIOGRAPHY CHEST WITH CONTRAST TECHNIQUE: Multidetector CT imaging of the chest was performed using the standard protocol during bolus administration of intravenous contrast. Multiplanar CT image reconstructions and MIPs were obtained to evaluate the vascular anatomy. CONTRAST:  18m OMNIPAQUE IOHEXOL 350 MG/ML SOLN COMPARISON:  Chest radiographs 10/16/2020. Abdominal CT 02/15/2020 and 04/17/2020. FINDINGS: Cardiovascular: The pulmonary arteries are well opacified with contrast to the level of the subsegmental branches. There is a single occluded subsegmental branch in the superior segment of the left lower lobe (images 28 through 34 of series 4), consistent with pulmonary embolism. The pulmonary arteries are otherwise patent, and there are no central pulmonary emboli. There is central enlargement of the pulmonary arteries consistent with pulmonary arterial hypertension. There is moderate atherosclerosis of the aorta, great vessels and coronary arteries. The heart size is normal. There is no pericardial effusion. Mediastinum/Nodes: There are no enlarged mediastinal, hilar or axillary lymph nodes.There are scattered small partially calcified mediastinal and hilar lymph nodes bilaterally. The thyroid gland, trachea and esophagus demonstrate no significant findings. Lungs/Pleura: There is no pleural effusion or pneumothorax. There is extensive upper lobe predominant chronic lung disease with thickening of the bronchovascular bundles, peribronchovascular scarring and traction bronchiectasis, compatible with chronic sarcoidosis. The basilar components have progressed compared with the prior abdominal CT. Scattered mucous plugging is noted. There is no confluent airspace opacity or suspicious pulmonary nodule. Upper abdomen: Nodular parents of the liver again noted suspicious for cirrhosis. No  adrenal mass. Musculoskeletal/Chest wall: There is no chest wall mass or suspicious osseous finding. Review of the MIP images confirms the above findings. IMPRESSION: 1. Single small focus of pulmonary embolism involving a subsegmental branch of the superior segment of the left lower lobe. This is of questionable significance regarding the patient's symptoms. No central pulmonary emboli. Consider lower extremity venous doppler ultrasound. 2. Central enlargement of the pulmonary arteries consistent with pulmonary arterial hypertension. 3. 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 The basilar components have progressed compared with the prior abdominal CT and superimposed inflammation/infection is not completely excluded. No evidence of neoplasm. 4. Aortic Atherosclerosis (ICD10-I70.0). 5. Critical Value/emergent results were called by telephone at the time of interpretation on 10/17/2020 at  4:16 pm to provider Aletta Edouard, who verbally acknowledged these results. Electronically Signed   By: Richardean Sale M.D.   On: 10/17/2020 16:17   DG Chest Port 1 View  Result Date: 10/16/2020 CLINICAL DATA:  Chest pain. EXAM: PORTABLE CHEST 1 VIEW COMPARISON:  None. FINDINGS: The heart size and mediastinal contours are within normal limits. No pneumothorax or pleural effusion is noted. Bilateral upper lobe opacities are noted which may represent pneumonia or possibly edema, but neoplasm cannot be excluded. The visualized skeletal structures are unremarkable. IMPRESSION: Bilateral upper lobe opacities are noted which may represent pneumonia or possibly edema, but neoplasm cannot be excluded. CT scan of the chest is recommended for further evaluation. Electronically Signed   By: Marijo Conception M.D.   On: 10/16/2020 22:45   ECHOCARDIOGRAM COMPLETE  Result Date: 10/17/2020    ECHOCARDIOGRAM REPORT   Patient Name:   Richard Webb Date of Exam: 10/17/2020 Medical Rec #:  [id number]         Height:       72.0 in Accession #:    [id number]       Weight:       214.9 lb Date of Birth:  Dec 21, 1945        BSA:          2.197 m Patient Age:    85 years         BP:           119/72 mmHg Patient Gender: M                HR:           60 bpm. Exam Location:  Inpatient Procedure: 2D Echo, Cardiac Doppler and Color Doppler Indications:    Dyspnea R06.00  History:        Patient has no prior history of Echocardiogram examinations and                 Patient has prior history of Echocardiogram examinations, most                 recent 08/10/2020. CAD, Pulmonary HTN; Risk Factors:Dyslipidemia                 and Sleep Apnea. Sarcoidosis. GERD. Asthma.  Sonographer:    Darlina Sicilian RDCS Referring Phys: [place]  1. Left ventricular ejection fraction, by estimation, is 70 to 75%. The left ventricle has hyperdynamic function. The left ventricle has no regional wall motion abnormalities. There is mild left ventricular hypertrophy of the basal-septal segment. Left ventricular diastolic parameters are consistent with Grade I diastolic dysfunction (impaired relaxation).  2. Right ventricular systolic function is normal. The right ventricular size is normal.  3. The mitral valve is normal in structure. Trivial mitral valve regurgitation. No evidence of mitral stenosis.  4. The aortic valve is tricuspid. Aortic valve regurgitation is not visualized. Mild aortic valve sclerosis is present, with no evidence of aortic valve stenosis. FINDINGS  Left Ventricle: Left ventricular ejection fraction, by estimation, is 70 to 75%. The left ventricle has hyperdynamic function. The left ventricle has no regional wall motion abnormalities. The left ventricular internal cavity size was normal in size. There is mild left ventricular hypertrophy of the basal-septal segment. Left ventricular diastolic parameters are consistent with Grade I diastolic dysfunction (impaired relaxation). Right Ventricle: The  right ventricular size is normal. Right ventricular systolic function is normal. Left Atrium: Left atrial size was normal in size. Right  Atrium: Right atrial size was normal in size. Pericardium: There is no evidence of pericardial effusion. Mitral Valve: The mitral valve is normal in structure. Mild mitral annular calcification. Trivial mitral valve regurgitation. No evidence of mitral valve stenosis. Tricuspid Valve: The tricuspid valve is normal in structure. Tricuspid valve regurgitation is trivial. No evidence of tricuspid stenosis. Aortic Valve: The aortic valve is tricuspid. Aortic valve regurgitation is not visualized. Mild aortic valve sclerosis is present, with no evidence of aortic valve stenosis. Pulmonic Valve: The pulmonic valve was normal in structure. Pulmonic valve regurgitation is trivial. No evidence of pulmonic stenosis. Aorta: The aortic root is normal in size and structure. Venous: The inferior vena cava was not well visualized. IAS/Shunts: The interatrial septum was not well visualized.  LEFT VENTRICLE PLAX 2D LVIDd:         4.44 cm  Diastology LVIDs:         2.35 cm  LV e' medial:    5.33 cm/s LV PW:         0.89 cm  LV E/e' medial:  13.6 LV IVS:        1.38 cm  LV e' lateral:   5.44 cm/s LVOT diam:     2.30 cm  LV E/e' lateral: 13.3 LV SV:         110 LV SV Index:   50 LVOT Area:     4.15 cm                          3D Volume EF:                         3D EF:        54 %                         LV EDV:       130 ml                         LV ESV:       59 ml                         LV SV:        70 ml RIGHT VENTRICLE RV S prime:     9.03 cm/s TAPSE (M-mode): 2.0 cm LEFT ATRIUM             Index       RIGHT ATRIUM           Index LA diam:        3.80 cm 1.73 cm/m  RA Area:     10.90 cm LA Vol (A2C):   45.6 ml 20.76 ml/m RA Volume:   18.90 ml  8.60 ml/m LA Vol (A4C):   46.7 ml 21.26 ml/m LA Biplane Vol: 47.1 ml 21.44 ml/m  AORTIC VALVE LVOT Vmax:   115.00 cm/s LVOT Vmean:  87.500 cm/s  LVOT VTI:    0.265 m  AORTA Ao Root diam: 3.70 cm Ao Asc diam:  3.40 cm MITRAL VALVE MV Area (PHT): 2.01 cm    SHUNTS MV Decel Time: 377 msec    Systemic VTI:  0.26 m MV E velocity: 72.40 cm/s  Systemic Diam: 2.30 cm MV A velocity: 81.80 cm/s MV E/A ratio:  0.89 Kirk Ruths MD Electronically signed by Aaron Edelman  Crenshaw MD Signature Date/Time: 10/17/2020/12:12:14 PM    Final      CBC Recent Labs  Lab 10/16/20 2215 10/17/20 0448 10/18/20 0536  WBC 11.3* 14.4* 11.2*  HGB 12.9* 12.9* 12.0*  HCT 39.8 40.0 38.2*  PLT 311 313 300  MCV 86.5 87.5 89.5  MCH 28.0 28.2 28.1  MCHC 32.4 32.3 31.4  RDW 15.3 15.4 15.9*    Chemistries  Recent Labs  Lab 10/16/20 2215 10/17/20 0448 10/18/20 0536  NA 133* 135 139  K 4.0 3.8 3.6  CL 99 104 104  CO2 21* 21* 24  GLUCOSE 306* 189* 113*  BUN 17 18 21   CREATININE 1.07 0.83 0.95  CALCIUM 9.0 9.1 9.1  MG  --  2.2  --   AST 26 19 19   ALT 12 14 17   ALKPHOS 120 110 71  BILITOT 0.8 0.6 0.7   ------------------------------------------------------------------------------------------------------------------ No results for input(s): CHOL, HDL, LDLCALC, TRIG, CHOLHDL, LDLDIRECT in the last 72 hours.  Lab Results  Component Value Date   HGBA1C 6.2 (H) 10/16/2020   ------------------------------------------------------------------------------------------------------------------ No results for input(s): TSH, T4TOTAL, T3FREE, THYROIDAB in the last 72 hours.  Invalid input(s): FREET3 ------------------------------------------------------------------------------------------------------------------ No results for input(s): VITAMINB12, FOLATE, FERRITIN, TIBC, IRON, RETICCTPCT in the last 72 hours.  Coagulation profile Recent Labs  Lab 10/17/20 0448  INR 1.1    No results for input(s): DDIMER in the last 72 hours.  Cardiac Enzymes No results for input(s): CKMB, TROPONINI, MYOGLOBIN in the last 168 hours.  Invalid input(s):  CK ------------------------------------------------------------------------------------------------------------------    Component Value Date/Time   BNP 10 10/18/2017 1648     Courage Emokpae M.D on 10/18/2020 at 11:50 AM  Go to [email or web address] - for contact info  [facility] - Office  [phone]

## 2020-10-18 NOTE — H&P (View-Only) (Signed)
Cardiology Consultation:   Patient ID: Richard Webb MRN: [id number]; DOB: 03/24/1946  Admit date: 10/16/2020 Date of Consult: 10/18/2020  PCP:  [name], [place]  Cardiologist:  Rozann Lesches, MD  Advanced Practice Provider:  No care team member to display Electrophysiologist:  None   :[id number]}    Patient Profile:   Richard Webb is a 75 y.o. male with a hx of CAD who is being seen today for the evaluation of chest pain at the request of Dr. Denton Brick.  History of Present Illness:   [name] is a 75 yo male patient with history of CAD DES x 3 RCA and DES Cfx 05/2010 [place], residual 50-70% dCfx in AV groove, 70% dLAD, 70% small D1, similar results on cath 2019, patent stents, medical therapy recommended. Also has HTN, HLD-statin intolerance, Pulmonary HTN, sarcoidosis.  Last saw Dr. Domenic Polite 06/2020 and doing well. Echo 12/21 normal LVEF 60-65% mild dd. Is scheduled for partial colectomy for diverticulitis 10/29/20  Patient now admited with chest pain, elevated troponins 137, 178, 207, 196, EKG's without acute change.CTA yest with single small focus of pulmonary embolism LL lobe? Significance in regards to patient's symptoms. No central PE. Echo 10/17/20 LVEF 70-75% hyperdynamic, mild LVH, Grade 1DD. Patient's wife died recently and he hasn't been feeling well. Chronic dyspnea on exertion that is unchanged. 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 Fri he had worsening SOB and after eating at a friends he developed chest tightness into left shoulder associated with bloating in his stomach. He took an extra Imdur rather than NTG. Pain free since he's been here. Has exertional angina about once a week relieved with rest.   Past Medical History:  Diagnosis Date  . Arthritis   . Asthma   . Coronary atherosclerosis of native coronary artery    a. DES x 3 RCA and DES mCx 10/11 -  [place] - with residual 50-70% stenosis in the distal Cx in AV groove, 70% distal LAD beyond apex, 70% small D1. b. 12/2017: similar results with multivessel CAD. Patent stents. Medical management recommended.   Marland Kitchen GERD (gastroesophageal reflux disease)   . History of kidney stones   . Mixed hyperlipidemia   . Obstructive sleep apnea   . Pulmonary hypertension (HCC)    PASP 50 mmHg  . Restless leg syndrome   . Sarcoidosis   . Statin intolerance   . Ulcerative colitis [facility])     Past Surgical History:  Procedure Laterality Date  . Ankle fracture repair Left 1985  . CARDIAC CATHETERIZATION     stents x4  . CHOLECYSTECTOMY  1970  . CYSTOSCOPY WITH INSERTION OF UROLIFT N/A 08/27/2017   Procedure: CYSTOSCOPY WITH INSERTION OF UROLIFT;  Surgeon: Cleon Gustin, MD;  Location: [facility];  Service: Urology;  Laterality: N/A;  . LAPAROSCOPIC APPENDECTOMY  2013  . LEFT HEART CATH AND CORONARY ANGIOGRAPHY N/A 01/02/2018   Procedure: LEFT HEART CATH AND CORONARY ANGIOGRAPHY;  Surgeon: Troy Sine, MD;  Location: [place] [facility];  Service: Cardiovascular;  Laterality: N/A;  . Right total knee replacement Bilateral 2005     Home Medications:  Prior to Admission medications   Medication Sig Start Date End Date Taking? Authorizing Provider  amLODipine (NORVASC) 10 MG tablet Take 10 mg by mouth daily. 09/21/20  Yes [provider]  aspirin EC 81 MG tablet Take 81 mg by mouth daily.  Yes [provider]  BREO ELLIPTA 200-25 MCG/INH AEPB Inhale 1 Inhaler into the lungs daily. 12/25/17  Yes [provider]  carbidopa-levodopa (SINEMET IR) 10-100 MG per tablet Take 1 tablet by mouth every evening.   Yes [provider]  cyanocobalamin (,VITAMIN B-12,) 1000 MCG/ML injection Inject 1,000 mcg into the muscle every 30 (thirty) days. 08/10/20  Yes [provider]  Dupilumab 300 MG/2ML SOPN Inject 300 mg into the skin every 14 (fourteen) days.   Yes [provider]  Evolocumab (REPATHA SURECLICK) 562 MG/ML SOAJ Inject 140 mg into the skin every 14 (fourteen) days.   Yes [provider]  finasteride (PROSCAR) 5 MG tablet Take 1 tablet (5 mg total) by mouth daily. 09/22/20  Yes McKenzie, Candee Furbish, MD  folic acid (FOLVITE) 1 MG tablet Take 1 mg by mouth See admin instructions. Mon - Fri   Yes [provider]  hydrochlorothiazide (HYDRODIURIL) 25 MG tablet Take 25 mg by mouth daily.   Yes [provider]  HYDROcodone-acetaminophen (NORCO/VICODIN) 5-325 MG tablet Take 1 tablet by mouth every 6 (six) hours as needed for severe pain. 08/06/20  Yes [provider]  isosorbide mononitrate (IMDUR) 30 MG 24 hr tablet TAKE 1 TABLET BY MOUTH  DAILY Patient taking differently: Take 30 mg by mouth daily. 02/24/20  Yes Satira Sark, MD  LORazepam (ATIVAN) 0.5 MG tablet Take 0.5 mg by mouth daily as needed for anxiety. 09/10/20  Yes [provider]  losartan (COZAAR) 50 MG tablet Take 50 mg by mouth daily. 08/03/20  Yes [provider]  metoprolol tartrate (LOPRESSOR) 25 MG tablet Take 12.5 mg by mouth 2 (two) times daily.   Yes [provider]  montelukast (SINGULAIR) 10 MG tablet Take 10 mg by mouth at bedtime.   Yes [provider]  nitroGLYCERIN (NITROSTAT) 0.4 MG SL tablet Place 1 tablet (0.4 mg total) under the tongue every 5 (five) minutes x 3 doses as needed for chest pain (if no relief after 3rd dose, proceed to the ED for an evaluation or call 911). 06/26/19  Yes Satira Sark, MD  omeprazole (PRILOSEC) 20 MG capsule Take 20 mg by mouth daily as needed (acid reflux). 07/13/20  Yes [provider]  penicillin v potassium (VEETID) 500 MG tablet Take by mouth See admin instructions. TAKE 2 TABLETS BY MOUTH STAT THEN 1 TABLET FOUR TIMES DAILY FOR 7 DAYS 10/14/20  Yes [provider]  temazepam (RESTORIL) 30 MG capsule Take 30 mg by mouth at bedtime as needed for  sleep.    Yes [provider]  Vitamin D, Ergocalciferol, (DRISDOL) 50000 UNITS CAPS capsule Take 50,000 Units by mouth every 7 (seven) days. Thursdays   Yes [provider]  potassium chloride (KLOR-CON) 10 MEQ tablet Take 1 tablet (10 mEq total) by mouth daily. 07/12/20 10/10/20  Satira Sark, MD  sulfaSALAzine (AZULFIDINE) 500 MG tablet Take 1,000 mg by mouth 2 (two) times daily.    [provider]    Inpatient Medications: Scheduled Meds: . aspirin EC  81 mg Oral Daily  . atorvastatin  20 mg Oral q1800  . carbidopa-levodopa  1 tablet Oral QHS  . Chlorhexidine Gluconate Cloth  6 each Topical Daily  . clopidogrel  75 mg Oral Daily  . finasteride  5 mg Oral Daily  . fluticasone furoate-vilanterol  1 puff Inhalation Daily  . insulin aspart  0-5 Units Subcutaneous QHS  . insulin aspart  0-9 Units Subcutaneous  TID WC  . isosorbide mononitrate  30 mg Oral Daily  . losartan  50 mg Oral Daily  . metoprolol tartrate  25 mg Oral BID  . montelukast  10 mg Oral QHS  . pantoprazole  40 mg Oral BID   Continuous Infusions: . heparin 1,200 Units/hr (10/18/20 0621)   PRN Meds: nitroGLYCERIN, temazepam  Allergies:    Allergies  Allergen Reactions  . Bee Venom Anaphylaxis  . Statins Other (See Comments)    Myalgias - Simvastatin and Rosuvastatin    Social History:   Social History   Socioeconomic History  . Marital status: Married    Spouse name: Not on file  . Number of children: Not on file  . Years of education: Not on file  . Highest education level: Not on file  Occupational History  . Occupation: Pastor  Tobacco Use  . Smoking status: Never Smoker  . Smokeless tobacco: Never Used  Vaping Use  . Vaping Use: Never used  Substance and Sexual Activity  . Alcohol use: No  . Drug use: No  . Sexual activity: Yes    Birth control/protection: None  Other Topics Concern  . Not on file  Social History Narrative  . Not on file   Social  Determinants of Health   Financial Resource Strain: Not on file  Food Insecurity: Not on file  Transportation Needs: Not on file  Physical Activity: Not on file  Stress: Not on file  Social Connections: Not on file  Intimate Partner Violence: Not on file    Family History:     Family History  Problem Relation Age of Onset  . Colon cancer Mother   . CAD Father      ROS:  Please see the history of present illness.  Review of Systems  Constitutional: Positive for malaise/fatigue.  HENT: Negative.   Cardiovascular: Positive for chest pain and dyspnea on exertion.  Respiratory: Positive for shortness of breath.   Endocrine: Negative.   Hematologic/Lymphatic: Negative.   Musculoskeletal: Negative.   Gastrointestinal: Positive for bloating.  Genitourinary: Negative.   Neurological: Negative.     All other ROS reviewed and negative.     Physical Exam/Data:   Vitals:   10/18/20 0500 10/18/20 0737 10/18/20 0800 10/18/20 0807  BP: (!) 147/89  (!) 143/70   Pulse: 71  63   Resp: 13  19   Temp:  97.8 F (36.6 C)    TempSrc:  Oral    SpO2: 98%  97% 99%  Weight:      Height:        Intake/Output Summary (Last 24 hours) at 10/18/2020 0950 Last data filed at 10/18/2020 0835 Gross per 24 hour  Intake 343.03 ml  Output 925 ml  Net -581.97 ml   Last 3 Weights 10/17/2020 10/16/2020 09/22/2020  Weight (lbs) 222 lb 7.1 oz 214 lb 15.2 oz 215 lb  Weight (kg) 100.9 kg 97.5 kg 97.523 kg     Body mass index is 30.17 kg/m.  General:  Well nourished, well developed, in no acute distress HEENT: normal Lymph: no adenopathy Neck: no JVD Endocrine:  No thryomegaly Vascular: No carotid bruits; FA pulses 2+ bilaterally without bruits  Cardiac:  normal S1, S2; RRR; no murmur  Lungs:  clear to auscultation bilaterally, no wheezing, rhonchi or rales  Abd: soft, nontender, no hepatomegaly  Ext: no edema Musculoskeletal:  No deformities, BUE and BLE strength normal and equal Skin: warm and  dry  Neuro:  CNs 2-12  intact, no focal abnormalities noted Psych:  Normal affect   EKG:  The EKG was personally reviewed and demonstrates:  Sinus brady 55 with first degree AV block,  Telemetry:  Telemetry was personally reviewed and demonstrates:  NSR with NSVT but looks like artifact- was up using bathroom, asymptomatic  Relevant CV Studies: Cardiac catheterization 2018/01/12:  Previously placed Dist RCA stent (unknown type) is widely patent.  Previously placed Ost RCA to Prox RCA stent (unknown type) is widely patent.  Prox RCA to Mid RCA lesion is 60% stenosed.  Mid RCA lesion is 70% stenosed.  Acute Mrg lesion is 95% stenosed.  Post Atrio lesion is 60% stenosed.  Ost RPDA lesion is 70% stenosed.  Previously placed Prox Cx stent (unknown type) is widely patent.  Mid Cx to Dist Cx lesion is 60% stenosed.  Ost 3rd Mrg to 3rd Mrg lesion is 50% stenosed.  Ost 2nd Diag to 2nd Diag lesion is 70% stenosed.  Mid LAD lesion is 70% stenosed.  Dist LAD lesion is 30% stenosed.  Prox LAD lesion is 55% stenosed.  The left ventricular ejection fraction is greater than 65% by visual estimate.  There is hyperdynamic left ventricular systolic function.  LV end diastolic pressure is normal.   Hyperdynamic LV function with a "spade-like ventricle "with an EF of at least 75%.   Multivessel CAD with 55% proximal LAD stenosis after the first small first diagonal vessel, with diffuse 70% bifurcation stenoses involving the mid LAD and second diagonal vessel and 30% distal apical diffuse stenoses; patent left circumflex stent in the AV groove between the first small marginal branch and second large marginal vessel.  There is diffuse 50 to 60% stenoses in the distal AV groove circumflex and bifurcating OM 3 vessel; large dominant RCA with patent proximal stent, diffuse 60 to 70% mid stenoses with 95% ostial stenosis and a small RV marginal branch and patent distal RCA stent proximal to the PDA  takeoff with 60 to 70% stenoses at the origin of the PDA and continuation branch beyond the PDA vessel.   Echocardiogram 10/23/2017: Study Conclusions   - Left ventricle: The cavity size was normal. Wall thickness was    increased increased in a pattern of mild to moderate LVH.    Systolic function was normal. The estimated ejection fraction was    in the range of 55% to 60%. Wall motion was normal; there were no    regional wall motion abnormalities. Doppler parameters are    consistent with abnormal left ventricular relaxation (grade 1    diastolic dysfunction).  - Aortic valve: Mildly calcified annulus. Trileaflet; mildly    thickened leaflets. Valve area (VTI): 2.54 cm^2. Valve area    (Vmax): 2.26 cm^2.  - Mitral valve: Mildly calcified annulus. Normal thickness leaflets    .  - Technically adequate study.      Laboratory Data:  High Sensitivity Troponin:   Recent Labs  Lab 10/17/20 0236 10/17/20 0448 10/17/20 0729 10/17/20 0923 10/17/20 1138  TROPONINIHS 66* 137* 178* 207* 196*     Chemistry Recent Labs  Lab 10/16/20 2215 10/17/20 0448 10/18/20 0536  NA 133* 135 139  K 4.0 3.8 3.6  CL 99 104 104  CO2 21* 21* 24  GLUCOSE 306* 189* 113*  BUN 17 18 21   CREATININE 1.07 0.83 0.95  CALCIUM 9.0 9.1 9.1  GFRNONAA >60 >60 >60  ANIONGAP 13 10 11     Recent Labs  Lab 10/16/20 2215 10/17/20 0448 10/18/20 0536  PROT 7.8 7.6 6.6  ALBUMIN 4.0 3.9 3.6  AST 26 19 19   ALT 12 14 17   ALKPHOS 120 110 71  BILITOT 0.8 0.6 0.7   Hematology Recent Labs  Lab 10/16/20 2215 10/17/20 0448 10/18/20 0536  WBC 11.3* 14.4* 11.2*  RBC 4.60 4.57 4.27  HGB 12.9* 12.9* 12.0*  HCT 39.8 40.0 38.2*  MCV 86.5 87.5 89.5  MCH 28.0 28.2 28.1  MCHC 32.4 32.3 31.4  RDW 15.3 15.4 15.9*  PLT 311 313 300   BNPNo results for input(s): BNP, PROBNP in the last 168 hours.  DDimer No results for input(s): DDIMER in the last 168 hours.   Radiology/Studies:  CT ANGIO CHEST PE W OR WO  CONTRAST  Result Date: 10/17/2020 CLINICAL DATA:  Onset of chest pain and shortness of breath last night. Abnormal chest x-ray. History of sarcoidosis, asthma and hypertension. Clinical suspicion of pulmonary embolism. EXAM: CT ANGIOGRAPHY CHEST WITH CONTRAST TECHNIQUE: Multidetector CT imaging of the chest was performed using the standard protocol during bolus administration of intravenous contrast. Multiplanar CT image reconstructions and MIPs were obtained to evaluate the vascular anatomy. CONTRAST:  135m OMNIPAQUE IOHEXOL 350 MG/ML SOLN COMPARISON:  Chest radiographs 10/16/2020. Abdominal CT 02/15/2020 and 04/17/2020. FINDINGS: Cardiovascular: The pulmonary arteries are well opacified with contrast to the level of the subsegmental branches. There is a single occluded subsegmental branch in the superior segment of the left lower lobe (images 28 through 34 of series 4), consistent with pulmonary embolism. The pulmonary arteries are otherwise patent, and there are no central pulmonary emboli. There is central enlargement of the pulmonary arteries consistent with pulmonary arterial hypertension. There is moderate atherosclerosis of the aorta, great vessels and coronary arteries. The heart size is normal. There is no pericardial effusion. Mediastinum/Nodes: There are no enlarged mediastinal, hilar or axillary lymph nodes.There are scattered small partially calcified mediastinal and hilar lymph nodes bilaterally. The thyroid gland, trachea and esophagus demonstrate no significant findings. Lungs/Pleura: There is no pleural effusion or pneumothorax. There is extensive upper lobe predominant chronic lung disease with thickening of the bronchovascular bundles, peribronchovascular scarring and traction bronchiectasis, compatible with chronic sarcoidosis. The basilar components have progressed compared with the prior abdominal CT. Scattered mucous plugging is noted. There is no confluent airspace opacity or suspicious  pulmonary nodule. Upper abdomen: Nodular parents of the liver again noted suspicious for cirrhosis. No adrenal mass. Musculoskeletal/Chest wall: There is no chest wall mass or suspicious osseous finding. Review of the MIP images confirms the above findings. IMPRESSION: 1. Single small focus of pulmonary embolism involving a subsegmental branch of the superior segment of the left lower lobe. This is of questionable significance regarding the patient's symptoms. No central pulmonary emboli. Consider lower extremity venous doppler ultrasound. 2. Central enlargement of the pulmonary arteries consistent with pulmonary arterial hypertension. 3. 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 The basilar components have progressed compared with the prior abdominal CT and superimposed inflammation/infection is not completely excluded. No evidence of neoplasm. 4. Aortic Atherosclerosis (ICD10-I70.0). 5. Critical Value/emergent results were called by telephone at the time of interpretation on 10/17/2020 at 4:16 pm to provider MAletta Edouard who verbally acknowledged these results. Electronically Signed   By: WRichardean SaleM.D.   On: 10/17/2020 16:17   DG Chest Port 1 View  Result Date: 10/16/2020 CLINICAL DATA:  Chest pain. EXAM: PORTABLE CHEST 1 VIEW COMPARISON:  None. FINDINGS: The heart size and mediastinal contours are within normal limits. No pneumothorax or pleural  effusion is noted. Bilateral upper lobe opacities are noted which may represent pneumonia or possibly edema, but neoplasm cannot be excluded. The visualized skeletal structures are unremarkable. IMPRESSION: Bilateral upper lobe opacities are noted which may represent pneumonia or possibly edema, but neoplasm cannot be excluded. CT scan of the chest is recommended for further evaluation. Electronically Signed   By: Marijo Conception M.D.   On: 10/16/2020 22:45   ECHOCARDIOGRAM COMPLETE  Result Date: 10/17/2020     ECHOCARDIOGRAM REPORT   Patient Name:   ENOCH MOFFA Date of Exam: 10/17/2020 Medical Rec #:  [id number]        Height:       72.0 in Accession #:    [id number]       Weight:       214.9 lb Date of Birth:  [birth year]        BSA:          2.197 m Patient Age:    11 years         BP:           119/72 mmHg Patient Gender: M                HR:           60 bpm. Exam Location:  Inpatient Procedure: 2D Echo, Cardiac Doppler and Color Doppler Indications:    Dyspnea R06.00  History:        Patient has no prior history of Echocardiogram examinations and                 Patient has prior history of Echocardiogram examinations, most                 recent 08/10/2020. CAD, Pulmonary HTN; Risk Factors:Dyslipidemia                 and Sleep Apnea. Sarcoidosis. GERD. Asthma.  Sonographer:    Darlina Sicilian RDCS Referring Phys: Lewis  1. Left ventricular ejection fraction, by estimation, is 70 to 75%. The left ventricle has hyperdynamic function. The left ventricle has no regional wall motion abnormalities. There is mild left ventricular hypertrophy of the basal-septal segment. Left ventricular diastolic parameters are consistent with Grade I diastolic dysfunction (impaired relaxation).  2. Right ventricular systolic function is normal. The right ventricular size is normal.  3. The mitral valve is normal in structure. Trivial mitral valve regurgitation. No evidence of mitral stenosis.  4. The aortic valve is tricuspid. Aortic valve regurgitation is not visualized. Mild aortic valve sclerosis is present, with no evidence of aortic valve stenosis. FINDINGS  Left Ventricle: Left ventricular ejection fraction, by estimation, is 70 to 75%. The left ventricle has hyperdynamic function. The left ventricle has no regional wall motion abnormalities. The left ventricular internal cavity size was normal in size. There is mild left ventricular hypertrophy of the basal-septal segment. Left ventricular diastolic  parameters are consistent with Grade I diastolic dysfunction (impaired relaxation). Right Ventricle: The right ventricular size is normal. Right ventricular systolic function is normal. Left Atrium: Left atrial size was normal in size. Right Atrium: Right atrial size was normal in size. Pericardium: There is no evidence of pericardial effusion. Mitral Valve: The mitral valve is normal in structure. Mild mitral annular calcification. Trivial mitral valve regurgitation. No evidence of mitral valve stenosis. Tricuspid Valve: The tricuspid valve is normal in structure. Tricuspid valve regurgitation is trivial. No evidence of tricuspid stenosis. Aortic Valve: The aortic valve is  tricuspid. Aortic valve regurgitation is not visualized. Mild aortic valve sclerosis is present, with no evidence of aortic valve stenosis. Pulmonic Valve: The pulmonic valve was normal in structure. Pulmonic valve regurgitation is trivial. No evidence of pulmonic stenosis. Aorta: The aortic root is normal in size and structure. Venous: The inferior vena cava was not well visualized. IAS/Shunts: The interatrial septum was not well visualized.  LEFT VENTRICLE PLAX 2D LVIDd:         4.44 cm  Diastology LVIDs:         2.35 cm  LV e' medial:    5.33 cm/s LV PW:         0.89 cm  LV E/e' medial:  13.6 LV IVS:        1.38 cm  LV e' lateral:   5.44 cm/s LVOT diam:     2.30 cm  LV E/e' lateral: 13.3 LV SV:         110 LV SV Index:   50 LVOT Area:     4.15 cm                          3D Volume EF:                         3D EF:        54 %                         LV EDV:       130 ml                         LV ESV:       59 ml                         LV SV:        70 ml RIGHT VENTRICLE RV S prime:     9.03 cm/s TAPSE (M-mode): 2.0 cm LEFT ATRIUM             Index       RIGHT ATRIUM           Index LA diam:        3.80 cm 1.73 cm/m  RA Area:     10.90 cm LA Vol (A2C):   45.6 ml 20.76 ml/m RA Volume:   18.90 ml  8.60 ml/m LA Vol (A4C):   46.7 ml 21.26  ml/m LA Biplane Vol: 47.1 ml 21.44 ml/m  AORTIC VALVE LVOT Vmax:   115.00 cm/s LVOT Vmean:  87.500 cm/s LVOT VTI:    0.265 m  AORTA Ao Root diam: 3.70 cm Ao Asc diam:  3.40 cm MITRAL VALVE MV Area (PHT): 2.01 cm    SHUNTS MV Decel Time: 377 msec    Systemic VTI:  0.26 m MV E velocity: 72.40 cm/s  Systemic Diam: 2.30 cm MV A velocity: 81.80 cm/s MV E/A ratio:  0.89 Kirk Ruths MD Electronically signed by Kirk Ruths MD Signature Date/Time: 10/17/2020/12:12:14 PM    Final      Assessment and Plan:   Chest pain consistent with angina, with elvated troponins up 204, no EKG changes, single small focus PE on CTA, echo with hyperdynamic LVEF 70-75% no WMA. 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 Currently on IV heparin and no further chest pain. Also scheduled for  partial colectomy for diverticulitis 10/29/20. Will discuss options with Dr. Harl Bowie including possible cath vs medical therapy.  CAD DES x 3 RCA and DES Cfx 05/2010 [place], residual 50-70% dCfx in AV groove, 70% dLAD, 70% small D1, similar results on cath 2019, patent stents, medical therapy recommended.  HTN controlled on losartan, HCTZ, metoprolol, Imdur  HLD statin intolerance on repatha  Sarcoidosis followed by pulmonary  Schedule for partial colectomy 10/29/20    Risk Assessment/Risk Scores:     TIMI Risk Score for Unstable Angina or Non-ST Elevation MI:   The patient's TIMI risk score is 6, which indicates a 41% risk of all cause mortality, new or recurrent myocardial infarction or need for urgent revascularization in the next 14 days.          For questions or updates, please contact [place] Please consult [email or web address] for contact info under    Signed, Ermalinda Barrios, PA-C  10/18/2020 9:50 AM   Attending note Patient seen and discussed with PA Bonnell Public, I agree with her documentation. 75 yo male history of CAD with prior stenting as reported above, HL, OSA, sarcoid admitted with chest pain and SOB  Descirbes a  tightnes midchest chest and associated pain left shoulder lasting about 1 hour. Reports symptoms similar to his angina before prior stents. Chronic DOE with some recent progression.    K 4 Cr 1.07 BUN 17 WBC 11.3 Plt 311 Hgb 12.9  Trop 8-->29-->66-->137-->178-->207-->196 COVID neg EKG SR, no acute ischemic changes CXR bilateral upper lobe opacities, pneumonia vs edema cannot exclude neoplasm CT PE single small focus of pulmonary embolism subsegmentla branch LUL, questionable significance Extensive upper lobe disease compatible with sarcoid Echo LVEF 70-75%, no WMAs, grade I dd, normal RV  12/2017 cath: prox LAD 55%, mid LAD 70%, D2 70%, LCx patent prox stent, mid LCX 60%, OM3 50%, RCA patent stent prox, mid 60-70%, acute marginal 95%, RPDA 70%   Presents with chest pain typical for angina, identical to his prior symptoms when he received stents. Trop peak 207, trending down. Very tiny PE on CT scan would not account for his symptoms and trop rise. GIven his typical symptoms, known significnat disease, and trop elevation concern remains about possible ACS. Plan for transfer to [facility] with cath tomrrow. He is scheduled for partial colectomy in March for diverticulitis, I spoke with his surgeon Dr Leighton Ruff who is fine postponing that surgery as needed if patient is placed on DAPT. Prior hematuria followed by urology that is nearly resolved and I dont see being a limitation for cath at this time.     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    I have reviewed the risks, indications, and alternatives to cardiac catheterization, possible angioplasty, and stenting with the patient today. Risks include but are not limited to bleeding, infection, vascular injury, stroke, myocardial infection, arrhythmia, kidney injury, radiation-related injury in the case of prolonged fluoroscopy use, emergency cardiac surgery, and death.  The patient understands the risks of serious complication is 1-2 in 1975 with diagnostic cardiac cath and 1-2% or less with angioplasty/stenting.   Carlyle Dolly MD

## 2020-10-19 ENCOUNTER — Encounter (HOSPITAL_COMMUNITY)
Admission: EM | Disposition: A | Discharge status: Home / Self Care | Payer: Self-pay | Source: Home / Self Care | Attending: Internal Medicine

## 2020-10-19 ENCOUNTER — Encounter (HOSPITAL_COMMUNITY): Payer: Self-pay | Admitting: Cardiovascular Disease

## 2020-10-19 ENCOUNTER — Encounter (HOSPITAL_COMMUNITY)
Admission: RE | Admit: 2020-10-19 | Discharge: 2020-10-19 | Disposition: A | Discharge status: Home / Self Care | Payer: Medicare Other | Source: Ambulatory Visit | Attending: Internal Medicine | Admitting: Internal Medicine

## 2020-10-19 ENCOUNTER — Inpatient Hospital Stay (HOSPITAL_COMMUNITY): Payer: Medicare Other

## 2020-10-19 DIAGNOSIS — I2699 Other pulmonary embolism without acute cor pulmonale: Secondary | ICD-10-CM

## 2020-10-19 HISTORY — PX: LEFT HEART CATH AND CORONARY ANGIOGRAPHY: CATH118249

## 2020-10-19 HISTORY — PX: CORONARY STENT INTERVENTION: CATH118234

## 2020-10-19 LAB — GLUCOSE, CAPILLARY
Glucose-Capillary: 129 mg/dL — ABNORMAL HIGH (ref 70–99)
Glucose-Capillary: 97 mg/dL (ref 70–99)
Glucose-Capillary: 176 mg/dL — ABNORMAL HIGH (ref 70–99)
Glucose-Capillary: 110 mg/dL — ABNORMAL HIGH (ref 70–99)

## 2020-10-19 LAB — C DIFFICILE QUICK SCREEN W PCR REFLEX
C Diff antigen: NEGATIVE
C Diff interpretation: NOT DETECTED
C Diff toxin: NEGATIVE

## 2020-10-19 LAB — LIPID PANEL
Cholesterol: 88 mg/dL (ref 0–200)
HDL: 47 mg/dL (ref >40)
LDL Cholesterol: 3 mg/dL (ref 0–99)
Total CHOL/HDL Ratio: 1.9 RATIO
Triglycerides: 190 mg/dL — ABNORMAL HIGH (ref <150)
VLDL: 38 mg/dL (ref 0–40)

## 2020-10-19 LAB — CBC
HCT: 40.9 % (ref 39.0–52.0)
Hemoglobin: 12.8 g/dL — ABNORMAL LOW (ref 13.0–17.0)
MCH: 27.8 pg (ref 26.0–34.0)
MCHC: 31.3 g/dL (ref 30.0–36.0)
MCV: 88.9 fL (ref 80.0–100.0)
Platelets: 309 10*3/uL (ref 150–400)
RBC: 4.6 MIL/uL (ref 4.22–5.81)
RDW: 15.8 % — ABNORMAL HIGH (ref 11.5–15.5)
WBC: 9.7 10*3/uL (ref 4.0–10.5)
nRBC: 0 % (ref 0.0–0.2)

## 2020-10-19 LAB — HEPARIN LEVEL (UNFRACTIONATED): Heparin Unfractionated: 0.31 IU/mL (ref 0.30–0.70)

## 2020-10-19 LAB — POCT ACTIVATED CLOTTING TIME
Activated Clotting Time: 273 seconds
Activated Clotting Time: 737 seconds

## 2020-10-19 LAB — TSH: TSH: 2.119 u[IU]/mL (ref 0.350–4.500)

## 2020-10-19 SURGERY — LEFT HEART CATH AND CORONARY ANGIOGRAPHY
Anesthesia: LOCAL

## 2020-10-19 MED ORDER — ACETAMINOPHEN 325 MG PO TABS
650.0000 mg | ORAL_TABLET | ORAL | Status: DC | PRN
  Administered 2020-10-19: 650 mg via ORAL
  Filled 2020-10-19: qty 2

## 2020-10-19 MED ORDER — HYDRALAZINE HCL 20 MG/ML IJ SOLN: 10.0000 mg | INTRAMUSCULAR | Status: AC | PRN

## 2020-10-19 MED ORDER — FENTANYL CITRATE (PF) 100 MCG/2ML IJ SOLN
INTRAMUSCULAR | Status: DC | PRN
  Administered 2020-10-19 (×4): 25 ug via INTRAVENOUS

## 2020-10-19 MED ORDER — LIDOCAINE HCL (PF) 1 % IJ SOLN
INTRAMUSCULAR | Status: AC
  Filled 2020-10-19: qty 30

## 2020-10-19 MED ORDER — SODIUM CHLORIDE 0.9% FLUSH: 3.0000 mL | INTRAVENOUS | Status: DC | PRN

## 2020-10-19 MED ORDER — VERAPAMIL HCL 2.5 MG/ML IV SOLN
INTRAVENOUS | Status: AC
  Filled 2020-10-19: qty 2

## 2020-10-19 MED ORDER — SODIUM CHLORIDE 0.9% FLUSH
3.0000 mL | Freq: Two times a day (BID) | INTRAVENOUS | Status: DC
  Administered 2020-10-19 – 2020-10-20 (×4): 3 mL via INTRAVENOUS

## 2020-10-19 MED ORDER — TICAGRELOR 90 MG PO TABS
ORAL_TABLET | ORAL | Status: DC | PRN
  Administered 2020-10-19: 180 mg via ORAL

## 2020-10-19 MED ORDER — NITROGLYCERIN 1 MG/10 ML FOR IR/CATH LAB
INTRA_ARTERIAL | Status: DC | PRN
  Administered 2020-10-19 (×2): 200 ug via INTRACORONARY
  Administered 2020-10-19: 100 ug via INTRACORONARY

## 2020-10-19 MED ORDER — APIXABAN 5 MG PO TABS
10.0000 mg | ORAL_TABLET | Freq: Two times a day (BID) | ORAL | Status: DC
  Administered 2020-10-19 – 2020-10-21 (×4): 10 mg via ORAL
  Filled 2020-10-19 (×4): qty 2

## 2020-10-19 MED ORDER — ASPIRIN 81 MG PO CHEW: 81.0000 mg | CHEWABLE_TABLET | Freq: Every day | ORAL | Status: DC

## 2020-10-19 MED ORDER — SODIUM CHLORIDE 0.9 % IV SOLN: 250.0000 mL | INTRAVENOUS | Status: DC | PRN

## 2020-10-19 MED ORDER — SODIUM CHLORIDE 0.9 % IV SOLN: INTRAVENOUS | Status: AC

## 2020-10-19 MED ORDER — LABETALOL HCL 5 MG/ML IV SOLN: 10.0000 mg | INTRAVENOUS | Status: AC | PRN

## 2020-10-19 MED ORDER — MIDAZOLAM HCL 2 MG/2ML IJ SOLN
INTRAMUSCULAR | Status: AC
  Filled 2020-10-19: qty 2

## 2020-10-19 MED ORDER — APIXABAN 5 MG PO TABS: 5.0000 mg | ORAL_TABLET | Freq: Two times a day (BID) | ORAL | Status: DC

## 2020-10-19 MED ORDER — IOHEXOL 350 MG/ML SOLN
INTRAVENOUS | Status: DC | PRN
  Administered 2020-10-19: 180 mL

## 2020-10-19 MED ORDER — MIDAZOLAM HCL 2 MG/2ML IJ SOLN
INTRAMUSCULAR | Status: DC | PRN
  Administered 2020-10-19 (×2): 1 mg via INTRAVENOUS
  Administered 2020-10-19: 2 mg via INTRAVENOUS

## 2020-10-19 MED ORDER — HEPARIN SODIUM (PORCINE) 1000 UNIT/ML IJ SOLN
INTRAMUSCULAR | Status: DC | PRN
  Administered 2020-10-19: 6000 [IU] via INTRAVENOUS
  Administered 2020-10-19: 3000 [IU] via INTRAVENOUS

## 2020-10-19 MED ORDER — HEPARIN (PORCINE) IN NACL 1000-0.9 UT/500ML-% IV SOLN
INTRAVENOUS | Status: AC
  Filled 2020-10-19: qty 500

## 2020-10-19 MED ORDER — HEPARIN SODIUM (PORCINE) 1000 UNIT/ML IJ SOLN
INTRAMUSCULAR | Status: AC
  Filled 2020-10-19: qty 1

## 2020-10-19 MED ORDER — NITROGLYCERIN 1 MG/10 ML FOR IR/CATH LAB
INTRA_ARTERIAL | Status: AC
  Filled 2020-10-19: qty 10

## 2020-10-19 MED ORDER — HEPARIN (PORCINE) IN NACL 1000-0.9 UT/500ML-% IV SOLN
INTRAVENOUS | Status: DC | PRN
  Administered 2020-10-19 (×2): 500 mL

## 2020-10-19 MED ORDER — FENTANYL CITRATE (PF) 100 MCG/2ML IJ SOLN
INTRAMUSCULAR | Status: AC
  Filled 2020-10-19: qty 2

## 2020-10-19 MED ORDER — TICAGRELOR 90 MG PO TABS
90.0000 mg | ORAL_TABLET | Freq: Two times a day (BID) | ORAL | Status: DC
  Administered 2020-10-19 – 2020-10-20 (×2): 90 mg via ORAL
  Filled 2020-10-19 (×2): qty 1

## 2020-10-19 MED ORDER — ONDANSETRON HCL 4 MG/2ML IJ SOLN: 4.0000 mg | Freq: Four times a day (QID) | INTRAMUSCULAR | Status: DC | PRN

## 2020-10-19 MED ORDER — VERAPAMIL HCL 2.5 MG/ML IV SOLN
INTRAVENOUS | Status: DC | PRN
  Administered 2020-10-19: 10 mL via INTRA_ARTERIAL

## 2020-10-19 MED ORDER — APIXABAN (ELIQUIS) EDUCATION KIT FOR DVT/PE PATIENTS
PACK | Freq: Once | Status: AC
  Filled 2020-10-19: qty 1

## 2020-10-19 MED ORDER — DIAZEPAM 5 MG PO TABS
5.0000 mg | ORAL_TABLET | Freq: Four times a day (QID) | ORAL | Status: DC | PRN
Start: 2020-10-19 — End: 2020-10-21
  Administered 2020-10-20: 5 mg via ORAL
  Filled 2020-10-19: qty 1

## 2020-10-19 MED ORDER — LIDOCAINE HCL (PF) 1 % IJ SOLN
INTRAMUSCULAR | Status: DC | PRN
  Administered 2020-10-19: 3 mL

## 2020-10-19 MED ORDER — TICAGRELOR 90 MG PO TABS
ORAL_TABLET | ORAL | Status: AC
  Filled 2020-10-19: qty 2

## 2020-10-19 SURGICAL SUPPLY — 18 items
BALLN SAPPHIRE 2.5X12 (BALLOONS) ×2
BALLN ~~LOC~~ EMERGE MR 4.5X8 (BALLOONS) ×2
BALLOON SAPPHIRE 2.5X12 (BALLOONS) ×1 IMPLANT
BALLOON ~~LOC~~ EMERGE MR 4.5X8 (BALLOONS) ×1 IMPLANT
CATH OPTITORQUE TIG 4.0 5F (CATHETERS) ×2 IMPLANT
CATH VISTA GUIDE 6FR XBLAD3.5 (CATHETERS) ×2 IMPLANT
DEVICE RAD COMP TR BAND LRG (VASCULAR PRODUCTS) ×2 IMPLANT
GLIDESHEATH SLEND SS 6F .021 (SHEATH) ×2 IMPLANT
GUIDEWIRE INQWIRE 1.5J.035X260 (WIRE) ×1 IMPLANT
INQWIRE 1.5J .035X260CM (WIRE) ×2
KIT ENCORE 26 ADVANTAGE (KITS) ×2 IMPLANT
KIT ESSENTIALS PG (KITS) ×2 IMPLANT
KIT HEART LEFT (KITS) ×2 IMPLANT
PACK CARDIAC CATHETERIZATION (CUSTOM PROCEDURE TRAY) ×2 IMPLANT
STENT RESOLUTE ONYX 4.0X15 (Permanent Stent) ×2 IMPLANT
TRANSDUCER W/STOPCOCK (MISCELLANEOUS) ×2 IMPLANT
TUBING CIL FLEX 10 FLL-RA (TUBING) ×2 IMPLANT
WIRE COUGAR XT STRL 190CM (WIRE) ×2 IMPLANT

## 2020-10-19 NOTE — Interval H&P Note (Signed)
Cath Lab Visit (complete for each Cath Lab visit)  Clinical Evaluation Leading to the Procedure:   ACS: Yes.    Non-ACS:    Anginal Classification: CCS III  Anti-ischemic medical therapy: Maximal Therapy (2 or more classes of medications)  Non-Invasive Test Results: No non-invasive testing performed  Prior CABG: No previous CABG      History and Physical Interval Note:  10/19/2020 9:27 AM  Richard Webb  has presented today for surgery, with the diagnosis of chest pain.  The various methods of treatment have been discussed with the patient and family. After consideration of risks, benefits and other options for treatment, the patient has consented to  Procedure(s): LEFT HEART CATH AND CORONARY ANGIOGRAPHY (N/A) as a surgical intervention.  The patient's history has been reviewed, patient examined, no change in status, stable for surgery.  I have reviewed the patient's chart and labs.  Questions were answered to the patient's satisfaction.     Richard Webb

## 2020-10-19 NOTE — Progress Notes (Signed)
Lower extremity venous has been completed.   Preliminary results in CV Proc.   Abram Sander 10/19/2020 2:31 PM

## 2020-10-19 NOTE — Progress Notes (Signed)
Progress Note  Patient Name: Richard Webb Date of Encounter: 10/19/2020  [facility] Cardiologist: Rozann Lesches, MD   Subjective   Had PCI earlier today.  Patient has severe multivessel coronary disease and a cath today there was severe progression in the proximal LAD segment.  This led to stent implantation with good result.  There is still severe mid and distal LAD stenosis, moderately severe mid RCA stenosis, and mild to moderate circumflex disease.  No current symptoms.  CT scan performed yesterday demonstrates a small subsegmental pulmonary embolism in the right upper lung of uncertain clinical significance per radiology report.  Plavix has been changed to ticagrelor by Dr. Claiborne Billings.  Inpatient Medications    Scheduled Meds: . aspirin  81 mg Oral Daily  . [START ON 10/20/2020] aspirin EC  81 mg Oral Daily  . atorvastatin  20 mg Oral q1800  . carbidopa-levodopa  1 tablet Oral QHS  . Chlorhexidine Gluconate Cloth  6 each Topical Daily  . clopidogrel  75 mg Oral Daily  . finasteride  5 mg Oral Daily  . fluticasone furoate-vilanterol  1 puff Inhalation Daily  . insulin aspart  0-5 Units Subcutaneous QHS  . insulin aspart  0-9 Units Subcutaneous TID WC  . isosorbide mononitrate  30 mg Oral Daily  . losartan  50 mg Oral Daily  . metoprolol tartrate  25 mg Oral BID  . montelukast  10 mg Oral QHS  . pantoprazole  40 mg Oral BID  . sodium chloride flush  3 mL Intravenous Q12H  . ticagrelor  90 mg Oral BID   Continuous Infusions: . sodium chloride    . sodium chloride    . heparin 1,200 Units/hr (10/19/20 0900)   PRN Meds: sodium chloride, acetaminophen, diazepam, hydrALAZINE, labetalol, loperamide, nitroGLYCERIN, ondansetron (ZOFRAN) IV, sodium chloride flush, temazepam   Vital Signs    Vitals:   10/19/20 1130 10/19/20 1135 10/19/20 1140 10/19/20 1145  BP: (!) 142/71 134/68 (!) 154/65 (!) 142/71  Pulse: (!) 54 (!) 56 (!) 54 (!) 57  Resp: 12 (!) 9 15 14   Temp:       TempSrc:      SpO2: 100% 100% 99% 100%  Weight:      Height:        Intake/Output Summary (Last 24 hours) at 10/19/2020 1309 Last data filed at 10/19/2020 0900 Gross per 24 hour  Intake 667.97 ml  Output 1725 ml  Net -1057.03 ml   Last 3 Weights 10/18/2020 10/17/2020 10/16/2020  Weight (lbs) 223 lb 15.8 oz 222 lb 7.1 oz 214 lb 15.2 oz  Weight (kg) 101.6 kg 100.9 kg 97.5 kg      Telemetry    Normal sinus rhythm- Personally Reviewed  ECG    Sinus bradycardia with first-degree AV block.- Personally Reviewed  Physical Exam  Elderly and  obese GEN: No acute distress.   Neck: No JVD Cardiac: RRR, no murmurs, rubs, or gallops.  Respiratory: Clear to auscultation bilaterally. GI: Soft, nontender, non-distended  MS: No edema; No deformity. Neuro:  Nonfocal  Psych: Normal affect   Labs    High Sensitivity Troponin:   Recent Labs  Lab 10/17/20 0236 10/17/20 0448 10/17/20 0729 10/17/20 0923 10/17/20 1138  TROPONINIHS 66* 137* 178* 207* 196*      Chemistry Recent Labs  Lab 10/16/20 2215 10/17/20 0448 10/18/20 0536  NA 133* 135 139  K 4.0 3.8 3.6  CL 99 104 104  CO2 21* 21* 24  GLUCOSE 306* 189* 113*  BUN 17 18 21   CREATININE 1.07 0.83 0.95  CALCIUM 9.0 9.1 9.1  PROT 7.8 7.6 6.6  ALBUMIN 4.0 3.9 3.6  AST 26 19 19   ALT 12 14 17   ALKPHOS 120 110 71  BILITOT 0.8 0.6 0.7  GFRNONAA >60 >60 >60  ANIONGAP 13 10 11      Hematology Recent Labs  Lab 10/17/20 0448 10/18/20 0536 10/19/20 0531  WBC 14.4* 11.2* 9.7  RBC 4.57 4.27 4.60  HGB 12.9* 12.0* 12.8*  HCT 40.0 38.2* 40.9  MCV 87.5 89.5 88.9  MCH 28.2 28.1 27.8  MCHC 32.3 31.4 31.3  RDW 15.4 15.9* 15.8*  PLT 313 300 309    BNPNo results for input(s): BNP, PROBNP in the last 168 hours.   DDimer No results for input(s): DDIMER in the last 168 hours.   Radiology    CT ANGIO CHEST PE W OR WO CONTRAST  Result Date: 10/17/2020 CLINICAL DATA:  Onset of chest pain and shortness of breath last night.  Abnormal chest x-ray. History of sarcoidosis, asthma and hypertension. Clinical suspicion of pulmonary embolism. EXAM: CT ANGIOGRAPHY CHEST WITH CONTRAST TECHNIQUE: Multidetector CT imaging of the chest was performed using the standard protocol during bolus administration of intravenous contrast. Multiplanar CT image reconstructions and MIPs were obtained to evaluate the vascular anatomy. CONTRAST:  160m OMNIPAQUE IOHEXOL 350 MG/ML SOLN COMPARISON:  Chest radiographs 10/16/2020. Abdominal CT 02/15/2020 and 04/17/2020. FINDINGS: Cardiovascular: The pulmonary arteries are well opacified with contrast to the level of the subsegmental branches. There is a single occluded subsegmental branch in the superior segment of the left lower lobe (images 28 through 34 of series 4), consistent with pulmonary embolism. The pulmonary arteries are otherwise patent, and there are no central pulmonary emboli. There is central enlargement of the pulmonary arteries consistent with pulmonary arterial hypertension. There is moderate atherosclerosis of the aorta, great vessels and coronary arteries. The heart size is normal. There is no pericardial effusion. Mediastinum/Nodes: There are no enlarged mediastinal, hilar or axillary lymph nodes.There are scattered small partially calcified mediastinal and hilar lymph nodes bilaterally. The thyroid gland, trachea and esophagus demonstrate no significant findings. Lungs/Pleura: There is no pleural effusion or pneumothorax. There is extensive upper lobe predominant chronic lung disease with thickening of the bronchovascular bundles, peribronchovascular scarring and traction bronchiectasis, compatible with chronic sarcoidosis. The basilar components have progressed compared with the prior abdominal CT. Scattered mucous plugging is noted. There is no confluent airspace opacity or suspicious pulmonary nodule. Upper abdomen: Nodular parents of the liver again noted suspicious for cirrhosis. No  adrenal mass. Musculoskeletal/Chest wall: There is no chest wall mass or suspicious osseous finding. Review of the MIP images confirms the above findings. IMPRESSION: 1. Single small focus of pulmonary embolism involving a subsegmental branch of the superior segment of the left lower lobe. This is of questionable significance regarding the patient's symptoms. No central pulmonary emboli. Consider lower extremity venous doppler ultrasound. 2. Central enlargement of the pulmonary arteries consistent with pulmonary arterial hypertension. 3. 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 The basilar components have progressed compared with the prior abdominal CT and superimposed inflammation/infection is not completely excluded. No evidence of neoplasm. 4. Aortic Atherosclerosis (ICD10-I70.0). 5. Critical Value/emergent results were called by telephone at the time of interpretation on 10/17/2020 at 4:16 pm to provider MAletta Edouard who verbally acknowledged these results. Electronically Signed   By: WRichardean SaleM.D.   On: 10/17/2020 16:17   CARDIAC CATHETERIZATION  Result Date: 10/19/2020  Prox LAD lesion is 90% stenosed.  Dist LM lesion is 30% stenosed.  2nd Diag lesion is 70% stenosed.  Mid LAD lesion is 50% stenosed.  Dist LAD-1 lesion is 90% stenosed.  Dist LAD-2 lesion is 80% stenosed.  Post intervention, there is a 0% residual stenosis.  2nd Mrg lesion is 50% stenosed.  Mid Cx to Dist Cx lesion is 70% stenosed.  Previously placed Prox Cx to Mid Cx stent (unknown type) is widely patent.  Ost Cx to Prox Cx lesion is 20% stenosed.  Previously placed Ost RCA to Prox RCA stent (unknown type) is widely patent.  Previously placed Dist RCA stent (unknown type) is widely patent.  Prox RCA to Mid RCA lesion is 50% stenosed.  Mid RCA-1 lesion is 50% stenosed.  Previously placed Mid RCA-2 stent (unknown type) is widely patent.  RV Branch lesion is 95% stenosed.   RPDA lesion is 70% stenosed.  RPAV lesion is 50% stenosed.  A stent was successfully placed.  Significant three-vessel CAD with previously placed stents in the circumflex, and RCA which remain patent. The LAD has 30% smooth ostial narrowing followed by 90% proximal LAD stenosis before the initial septal perforating artery.  The mid LAD and diagonal vessel is previously noted 70 and 50% stenoses.  There is diffuse distal apical LAD disease of 90 and 80%. The circumflex stent is widely patent.  There is mild proximal 20% narrowing.  There is mid AV groove circumflex stenoses of 70% with 50% OM 2 stenosis. The RCA has patent stents at the ostium, proximal to the acute margin, and prior to the PDA vessel.  There is previously noted 50% stenoses slightly improved from previously in the mid RCA with old 95% ostial stenosis and a small marginal branch.  The PDA is small and has ostial 70% stenosis and there is 50% continuation branch stenosis in the distal RCA. Successful PCI with DES stenting of the proximal LAD with ultimate insertion of a 4.0 x 15 mm Resolute Onyx stent postdilated with stent taper from 4.45 to 4.20 mm with residual narrowing 0% and brisk TIMI-3 flow. LVEDP 13 mm. RECOMMENDATION: DAPT therapy ideally for greater than 1 year.  The patient will have to defer his planned upcoming surgery and possibly this can be done after 6 months if he remains stable.  Recommend increase medical therapy for concomitant CAD with increase nitrates, beta-blocker therapy, consider initiation of ranolazine.  Continue aggressive lipid-lowering therapy with PCSK9 inhibition.    Cardiac Studies   Post PCI coronary image 10/19/2020: Intervention      Patient Profile     75 y.o. male CAD who is being seen today for the evaluation of chest pain and h/o CAD (CAD DES x 3 RCA and DES Cfx 05/2010 [place], residual 50-70% dCfx in AV groove, 70% dLAD, 70% small D1, similar results on cath 2019, patent stents), primary  hypertension, hyperlipidemia, statin intol, pulmonary hypertension, and sarcoidosis.  Assessment & Plan    1. CAD, native with progression to severe proximal LAD: Successfully treated with stent. 2. Pulmonary embolism: Please see report, described as a small subsegmental left lower lobe of lung.  Will discuss whether full anticoagulation is needed.  We will get bilateral lower extremity DVT study.  Please see pulmonary consultation where pulmonary embolism was recognized but no recommendation made for anticoagulation. 3. Pulmonary sarcoidosi: See pulmonary consultation 4. Pulmonary hypertension: See pulmonary consultation 5. Hyperlipidemia: High intensity statin therapy 6. Asthma: Not currently symptomatic  Addendum: 2:16 PM Discussed  CT finding with Dr. Vaughan Browner reviewed the CT scan.  He feels like there is a small complaining embolus in the left lower lobe.  He feels that she has been treated.  We discussed the increased bleeding risk but in the clinical context, we both agree that therapy will be started.  Will switch from Brilinta to Plavix after 1 month.  We will continue aspirin, Brilinta, and Xarelto combo until doing been discontinue aspirin as a stronger        For questions or updates, please contact [place] Please consult [email or web address] for contact info under        Signed, Sinclair Grooms, MD  10/19/2020, 1:09 PM

## 2020-10-19 NOTE — Progress Notes (Signed)
PROGRESS NOTE    Richard Webb   OMV:[id number]  DOB: 02-Apr-1946  DOA: 10/16/2020     2  PCP: Richard Blitz, MD  CC: CP/SOB  Hospital Course: Richard Webb is a 75 yo male with PMH pulmonary sarcoidosis (follows with pulm), CAD, HTN, UC, GERD, asthma, diverticulitis, BPH with LUTS s/p urolift (now with [facility]). He presented to [facility] with CP and was transferred to [facility] for an NSTEMI. He underwent a DES to the proximal LAD on 10/19/20.   Workup also notable for a CTA chest done on 10/17/20 which showed a probable small PE involving the LLL.  He was also evaluated by pulmonology and it was ruled out that there was any acute component involving his sarcoidosis.  He was not recommended for any treatment and will follow up outpatient with pulmonology (however, of note his previous pulmonologist has since retired and he will need to establish new care with a different provider).   Interval History:  Seen in his room after returning from Cath Lab.  His 2 sons were in the room as well as his daughter-in-law on the phone.  Full update given and questions answered to the best my ability.  Family had several questions regarding his multiple underlying comorbidities, the PE, the stent, his possible history of cirrhosis, and some concern for bleeding with voiding lately.  ROS: Constitutional: negative for chills and fevers, Respiratory: negative for cough and wheezing, Cardiovascular: negative for chest pain and Gastrointestinal: negative for abdominal pain  Assessment & Plan: NSTEMI CAD - s/p cath on 3/1; now with new DES to prox LAD (hx underlying CAD as well) - TBD in regards to DAPT regimen with underlying PE noted recently; for now on asa/brilinta - CP free when seen after LHC  PE - noted on CTA chest on 2/27; small focus in LLL; likely not causing his sxms - he has been under tremendous stress per daughter in law (his wife passed away ~1 month ago) and he was a little more sedentary than usual  but not much per family; given underlying pulm HTN and history of sarcoidosis this PE may be a complication of sarcoid - check LE duplex - may need to discuss with pulm regarding presence of PE and if felt related to sarcoid; if so would patient warrant prolonged/lifelong anticoagulation  - currently on asa/brilinta - cardiology planning to discuss case with pulm for further consensus on treatment plan  BPH with LUTS - s/p urolift; recent urology note reviewed from 06/2019 - he did not tolerate myrbertiq per urology - patient and family now also reporting intermittent hematuria; need to watch this while on DAPT/anticoagulation; family had a lot of concerns; tried discussing that this would need to be monitored for potential worsening at this time as risk of stopping treatment for stent outweighs benefit   Sarcoidosis - follows with pulm; seen inpatient; no concern for acute flare - his pulmonologist has retired and will to establish with new provider at discharge - seen PE discussion as well  Hx Diverticulitis - was to undergo partial colectomy in March as well but this is now postponed 2/2 stent placement     Old records reviewed in assessment of this patient  Antimicrobials:   DVT prophylaxis: SCD's Start: 10/19/20 1304 SCDs Start: 10/17/20 0434  Eliquis started afternoon of 3/1   Code Status:   Code Status: Full Code Family Communication: sons bedside  Disposition Plan: Status is: Inpatient  Remains inpatient appropriate because:Ongoing diagnostic testing needed not appropriate  for outpatient work up, IV treatments appropriate due to intensity of illness or inability to take PO and Inpatient level of care appropriate due to severity of illness   Dispo: The patient is from: Home              Anticipated d/c is to: Home              Patient currently is not medically stable to d/c.   Difficult to place patient No   Risk of unplanned readmission score: Unplanned  Admission- Pilot do not use: 16.82   Objective: Blood pressure (!) 142/71, pulse (!) 57, temperature 98.1 F (36.7 C), temperature source Oral, resp. rate 14, height 6' (1.829 m), weight 101.6 kg, SpO2 100 %.  Examination: General appearance: alert, cooperative and no distress Head: Normocephalic, without obvious abnormality, atraumatic Eyes: EOMI Lungs: scattered coarse sounds bilaterally; no wheezing Heart: regular rate and rhythm and S1, S2 normal Abdomen: normal findings: bowel sounds normal and soft, non-tender Extremities: no edema Skin: mobility and turgor normal Neurologic: Grossly normal  Consultants:   Cardiology  Pulm  Procedures:   LHC, 10/19/20  Data Reviewed: I have personally reviewed following labs and imaging studies Results for orders placed or performed during the hospital encounter of 10/16/20 (from the past 24 hour(s))  Glucose, capillary     Status: Abnormal   Collection Time: 10/18/20  4:09 PM  Result Value Ref Range   Glucose-Capillary 130 (H) 70 - 99 mg/dL  C Difficile Quick Screen w PCR reflex     Status: None   Collection Time: 10/18/20  7:28 PM   Specimen: STOOL  Result Value Ref Range   C Diff antigen NEGATIVE NEGATIVE   C Diff toxin NEGATIVE NEGATIVE   C Diff interpretation No C. difficile detected.   Glucose, capillary     Status: Abnormal   Collection Time: 10/18/20  9:07 PM  Result Value Ref Range   Glucose-Capillary 126 (H) 70 - 99 mg/dL  Heparin level (unfractionated)     Status: None   Collection Time: 10/19/20  5:31 AM  Result Value Ref Range   Heparin Unfractionated 0.31 0.30 - 0.70 IU/mL  CBC     Status: Abnormal   Collection Time: 10/19/20  5:31 AM  Result Value Ref Range   WBC 9.7 4.0 - 10.5 K/uL   RBC 4.60 4.22 - 5.81 MIL/uL   Hemoglobin 12.8 (L) 13.0 - 17.0 g/dL   HCT 40.9 39.0 - 52.0 %   MCV 88.9 80.0 - 100.0 fL   MCH 27.8 26.0 - 34.0 pg   MCHC 31.3 30.0 - 36.0 g/dL   RDW 15.8 (H) 11.5 - 15.5 %   Platelets 309 150 -  400 K/uL   nRBC 0.0 0.0 - 0.2 %  TSH     Status: None   Collection Time: 10/19/20  5:31 AM  Result Value Ref Range   TSH 2.119 0.350 - 4.500 uIU/mL  Lipid panel     Status: Abnormal   Collection Time: 10/19/20  5:31 AM  Result Value Ref Range   Cholesterol 88 0 - 200 mg/dL   Triglycerides 190 (H) <150 mg/dL   HDL 47 >40 mg/dL   Total CHOL/HDL Ratio 1.9 RATIO   VLDL 38 0 - 40 mg/dL   LDL Cholesterol 3 0 - 99 mg/dL  Glucose, capillary     Status: Abnormal   Collection Time: 10/19/20  6:20 AM  Result Value Ref Range   Glucose-Capillary 129 (H) 70 -  99 mg/dL  Glucose, capillary     Status: None   Collection Time: 10/19/20 11:13 AM  Result Value Ref Range   Glucose-Capillary 97 70 - 99 mg/dL    Recent Results (from the past 240 hour(s))  SARS CORONAVIRUS 2 (TAT 6-24 HRS) Nasopharyngeal Nasopharyngeal Swab     Status: None   Collection Time: 10/17/20  4:00 AM   Specimen: Nasopharyngeal Swab  Result Value Ref Range Status   SARS Coronavirus 2 NEGATIVE NEGATIVE Final    Comment: (NOTE) SARS-CoV-2 target nucleic acids are NOT DETECTED.  The SARS-CoV-2 RNA is generally detectable in upper and lower respiratory specimens during the acute phase of infection. Negative results do not preclude SARS-CoV-2 infection, do not rule out co-infections with other pathogens, and should not be used as the sole basis for treatment or other patient management decisions. Negative results must be combined with clinical observations, patient history, and epidemiological information. The expected result is Negative.  Fact Sheet for Patients: SugarRoll.be  Fact Sheet for Healthcare Providers: https://www.woods-mathews.com/  This test is not yet approved or cleared by the [place] FDA and  has been authorized for detection and/or diagnosis of SARS-CoV-2 by FDA under an Emergency Use Authorization (EUA). This EUA will remain  in effect (meaning this test  can be used) for the duration of the COVID-19 declaration under Se ction 564(b)(1) of the Act, 21 U.S.C. section 360bbb-3(b)(1), unless the authorization is terminated or revoked sooner.  Performed at [facility], [place] [address] [place], [place] [address]   MRSA PCR Screening     Status: None   Collection Time: 10/17/20  5:48 PM   Specimen: Nasopharyngeal  Result Value Ref Range Status   MRSA by PCR NEGATIVE NEGATIVE Final    Comment:        The GeneXpert MRSA Assay (FDA approved for NASAL specimens only), is one component of a comprehensive MRSA colonization surveillance program. It is not intended to diagnose MRSA infection nor to guide or monitor treatment for MRSA infections. Performed at [facility], [address], [place], [place] [address]   C Difficile Quick Screen w PCR reflex     Status: None   Collection Time: 10/18/20  7:28 PM   Specimen: STOOL  Result Value Ref Range Status   C Diff antigen NEGATIVE NEGATIVE Final   C Diff toxin NEGATIVE NEGATIVE Final   C Diff interpretation No C. difficile detected.  Final    Comment: Performed at [facility], [place] [address], [place], [place] [address]     Radiology Studies: CT ANGIO CHEST PE W OR WO CONTRAST  Result Date: 10/17/2020 CLINICAL DATA:  Onset of chest pain and shortness of breath last night. Abnormal chest x-ray. History of sarcoidosis, asthma and hypertension. Clinical suspicion of pulmonary embolism. EXAM: CT ANGIOGRAPHY CHEST WITH CONTRAST TECHNIQUE: Multidetector CT imaging of the chest was performed using the standard protocol during bolus administration of intravenous contrast. Multiplanar CT image reconstructions and MIPs were obtained to evaluate the vascular anatomy. CONTRAST:  182m OMNIPAQUE IOHEXOL 350 MG/ML SOLN COMPARISON:  Chest radiographs 10/16/2020. Abdominal CT 02/15/2020 and 04/17/2020. FINDINGS: Cardiovascular: The pulmonary arteries are well opacified with contrast to the level of  the subsegmental branches. There is a single occluded subsegmental branch in the superior segment of the left lower lobe (images 28 through 34 of series 4), consistent with pulmonary embolism. The pulmonary arteries are otherwise patent, and there are no central pulmonary emboli. There is central enlargement  of the pulmonary arteries consistent with pulmonary arterial hypertension. There is moderate atherosclerosis of the aorta, great vessels and coronary arteries. The heart size is normal. There is no pericardial effusion. Mediastinum/Nodes: There are no enlarged mediastinal, hilar or axillary lymph nodes.There are scattered small partially calcified mediastinal and hilar lymph nodes bilaterally. The thyroid gland, trachea and esophagus demonstrate no significant findings. Lungs/Pleura: There is no pleural effusion or pneumothorax. There is extensive upper lobe predominant chronic lung disease with thickening of the bronchovascular bundles, peribronchovascular scarring and traction bronchiectasis, compatible with chronic sarcoidosis. The basilar components have progressed compared with the prior abdominal CT. Scattered mucous plugging is noted. There is no confluent airspace opacity or suspicious pulmonary nodule. Upper abdomen: Nodular parents of the liver again noted suspicious for cirrhosis. No adrenal mass. Musculoskeletal/Chest wall: There is no chest wall mass or suspicious osseous finding. Review of the MIP images confirms the above findings. IMPRESSION: 1. Single small focus of pulmonary embolism involving a subsegmental branch of the superior segment of the left lower lobe. This is of questionable significance regarding the patient's symptoms. No central pulmonary emboli. Consider lower extremity venous doppler ultrasound. 2. Central enlargement of the pulmonary arteries consistent with pulmonary arterial hypertension. 3. 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  The basilar components have progressed compared with the prior abdominal CT and superimposed inflammation/infection is not completely excluded. No evidence of neoplasm. 4. Aortic Atherosclerosis (ICD10-I70.0). 5. Critical Value/emergent results were called by telephone at the time of interpretation on 10/17/2020 at 4:16 pm to provider Aletta Edouard, who verbally acknowledged these results. Electronically Signed   By: Richardean Sale M.D.   On: 10/17/2020 16:17   CARDIAC CATHETERIZATION  Result Date: 10/19/2020  Prox LAD lesion is 90% stenosed.  Dist LM lesion is 30% stenosed.  2nd Diag lesion is 70% stenosed.  Mid LAD lesion is 50% stenosed.  Dist LAD-1 lesion is 90% stenosed.  Dist LAD-2 lesion is 80% stenosed.  Post intervention, there is a 0% residual stenosis.  2nd Mrg lesion is 50% stenosed.  Mid Cx to Dist Cx lesion is 70% stenosed.  Previously placed Prox Cx to Mid Cx stent (unknown type) is widely patent.  Ost Cx to Prox Cx lesion is 20% stenosed.  Previously placed Ost RCA to Prox RCA stent (unknown type) is widely patent.  Previously placed Dist RCA stent (unknown type) is widely patent.  Prox RCA to Mid RCA lesion is 50% stenosed.  Mid RCA-1 lesion is 50% stenosed.  Previously placed Mid RCA-2 stent (unknown type) is widely patent.  RV Branch lesion is 95% stenosed.  RPDA lesion is 70% stenosed.  RPAV lesion is 50% stenosed.  A stent was successfully placed.  Significant three-vessel CAD with previously placed stents in the circumflex, and RCA which remain patent. The LAD has 30% smooth ostial narrowing followed by 90% proximal LAD stenosis before the initial septal perforating artery.  The mid LAD and diagonal vessel is previously noted 70 and 50% stenoses.  There is diffuse distal apical LAD disease of 90 and 80%. The circumflex stent is widely patent.  There is mild proximal 20% narrowing.  There is mid AV groove circumflex stenoses of 70% with 50% OM 2 stenosis. The RCA has  patent stents at the ostium, proximal to the acute margin, and prior to the PDA vessel.  There is previously noted 50% stenoses slightly improved from previously in the mid RCA with old 95% ostial stenosis and a small marginal branch.  The PDA is small and has ostial 70% stenosis and there is 50% continuation branch stenosis in the distal RCA. Successful PCI with DES stenting of the proximal LAD with ultimate insertion of a 4.0 x 15 mm Resolute Onyx stent postdilated with stent taper from 4.45 to 4.20 mm with residual narrowing 0% and brisk TIMI-3 flow. LVEDP 13 mm. RECOMMENDATION: DAPT therapy ideally for greater than 1 year.  The patient will have to defer his planned upcoming surgery and possibly this can be done after 6 months if he remains stable.  Recommend increase medical therapy for concomitant CAD with increase nitrates, beta-blocker therapy, consider initiation of ranolazine.  Continue aggressive lipid-lowering therapy with PCSK9 inhibition.   VAS [place] LOWER EXTREMITY VENOUS (DVT)  Result Date: 10/19/2020  Lower Venous DVT Study Comparison Study: 04/19/20 previous Performing Technologist: Abram Sander RVS  Examination Guidelines: A complete evaluation includes B-mode imaging, spectral Doppler, color Doppler, and power Doppler as needed of all accessible portions of each vessel. Bilateral testing is considered an integral part of a complete examination. Limited examinations for reoccurring indications may be performed as noted. The reflux portion of the exam is performed with the patient in reverse Trendelenburg.  +---------+---------------+---------+-----------+----------+--------------+ RIGHT    CompressibilityPhasicitySpontaneityPropertiesThrombus Aging +---------+---------------+---------+-----------+----------+--------------+ CFV      Full           Yes      Yes                                 +---------+---------------+---------+-----------+----------+--------------+ SFJ      Full                                                         +---------+---------------+---------+-----------+----------+--------------+ FV Prox  Full                                                        +---------+---------------+---------+-----------+----------+--------------+ FV Mid   Full                                                        +---------+---------------+---------+-----------+----------+--------------+ FV DistalFull                                                        +---------+---------------+---------+-----------+----------+--------------+ PFV      Full                                                        +---------+---------------+---------+-----------+----------+--------------+ POP      Full           Yes  Yes                                 +---------+---------------+---------+-----------+----------+--------------+ PTV      Full                                                        +---------+---------------+---------+-----------+----------+--------------+ PERO     Full                                                        +---------+---------------+---------+-----------+----------+--------------+   +---------+---------------+---------+-----------+----------+--------------+ LEFT     CompressibilityPhasicitySpontaneityPropertiesThrombus Aging +---------+---------------+---------+-----------+----------+--------------+ CFV      Full           Yes      Yes                                 +---------+---------------+---------+-----------+----------+--------------+ SFJ      Full                                                        +---------+---------------+---------+-----------+----------+--------------+ FV Prox  Full                                                        +---------+---------------+---------+-----------+----------+--------------+ FV Mid   Full                                                         +---------+---------------+---------+-----------+----------+--------------+ FV DistalFull                                                        +---------+---------------+---------+-----------+----------+--------------+ PFV      Full                                                        +---------+---------------+---------+-----------+----------+--------------+ POP      Full           Yes      Yes                                 +---------+---------------+---------+-----------+----------+--------------+ PTV      Full                                                        +---------+---------------+---------+-----------+----------+--------------+  PERO     Full                                                        +---------+---------------+---------+-----------+----------+--------------+     Summary: BILATERAL: - No evidence of deep vein thrombosis seen in the lower extremities, bilaterally. - No evidence of superficial venous thrombosis in the lower extremities, bilaterally. -No evidence of popliteal cyst, bilaterally.   *See table(s) above for measurements and observations.    Preliminary    VAS [place] LOWER EXTREMITY VENOUS (DVT)      CT ANGIO CHEST PE W OR WO CONTRAST  Final Result    DG Chest Port 1 View  Final Result      Scheduled Meds: . [START ON 10/20/2020] aspirin EC  81 mg Oral Daily  . atorvastatin  20 mg Oral q1800  . carbidopa-levodopa  1 tablet Oral QHS  . Chlorhexidine Gluconate Cloth  6 each Topical Daily  . finasteride  5 mg Oral Daily  . fluticasone furoate-vilanterol  1 puff Inhalation Daily  . insulin aspart  0-5 Units Subcutaneous QHS  . insulin aspart  0-9 Units Subcutaneous TID WC  . isosorbide mononitrate  30 mg Oral Daily  . losartan  50 mg Oral Daily  . metoprolol tartrate  25 mg Oral BID  . montelukast  10 mg Oral QHS  . pantoprazole  40 mg Oral BID  . sodium chloride flush  3 mL Intravenous Q12H  . ticagrelor  90 mg Oral  BID   PRN Meds: sodium chloride, acetaminophen, diazepam, hydrALAZINE, labetalol, loperamide, nitroGLYCERIN, ondansetron (ZOFRAN) IV, sodium chloride flush, temazepam Continuous Infusions: . sodium chloride 150 mL/hr at 10/19/20 1419  . sodium chloride    . heparin 1,200 Units/hr (10/19/20 0900)     LOS: 2 days  Time spent: Greater than 50% of the 35 minute visit was spent in counseling/coordination of care for the patient as laid out in the A&P.   Dwyane Dee, MD [facility] 10/19/2020, 2:34 PM

## 2020-10-19 NOTE — Progress Notes (Addendum)
[place] for apixaban Indication: chest pain/ACS  Allergies  Allergen Reactions  . Bee Venom Anaphylaxis  . Statins Other (See Comments)    Myalgias - Simvastatin and Rosuvastatin    Patient Measurements: Height: 6' (182.9 cm) Weight: 101.6 kg (223 lb 15.8 oz) IBW/kg (Calculated) : 77.6 HEPARIN DW (KG): 98.4  Vital Signs: Temp: 98.1 F (36.7 C) (03/01 0630) Temp Source: Oral (03/01 0630) BP: 149/65 (03/01 1110) Pulse Rate: 57 (03/01 1110)  Labs: Recent Labs    10/16/20 2215 10/17/20 0033 10/17/20 0448 10/17/20 0729 10/17/20 0923 10/17/20 1138 10/17/20 1727 10/18/20 0536 10/19/20 0531  HGB 12.9*  --  12.9*  --   --   --   --  12.0* 12.8*  HCT 39.8  --  40.0  --   --   --   --  38.2* 40.9  PLT 311  --  313  --   --   --   --  300 309  APTT  --   --  29  --   --   --   --   --   --   LABPROT  --   --  13.4  --   --   --   --   --   --   INR  --   --  1.1  --   --   --   --   --   --   HEPARINUNFRC  --   --   --   --   --   --  0.39 0.38 0.31  CREATININE 1.07  --  0.83  --   --   --   --  0.95  --   TROPONINIHS 8   < > 137* 178* 207* 196*  --   --   --    < > = values in this interval not displayed.    Estimated Creatinine Clearance: 84.1 mL/min (by C-G formula based on SCr of 0.95 mg/dL).  Assessment: 75 y.o. M started on heparin gtt for increasing troponin. Heparin level at goal this morning. Patient currently in cath. CBC stable overnight.   Goal of Therapy:  Heparin level 0.3-0.7 units/ml Monitor platelets by anticoagulation protocol: Yes   Plan:  Follow up plan post cath  Erin Hearing PharmD., BCPS Clinical Pharmacist 10/19/2020 11:15 AM  Addendum:  Stent placed in cath lab this morning. Also patient noted to have a small PE on CT scan 2/27. Discussed with pulmonology and cardiology. Will treat with apixaban as well as brilinta x 1 month and aspirin x 1 month then change to apixaban + plavix.   Copays for  apixaban and brilinta are $47 per month. Will provide education prior to discharge.   10/19/2020 2:36 PM

## 2020-10-20 LAB — GLUCOSE, CAPILLARY
Glucose-Capillary: 162 mg/dL — ABNORMAL HIGH (ref 70–99)
Glucose-Capillary: 102 mg/dL — ABNORMAL HIGH (ref 70–99)
Glucose-Capillary: 94 mg/dL (ref 70–99)
Glucose-Capillary: 136 mg/dL — ABNORMAL HIGH (ref 70–99)

## 2020-10-20 LAB — CBC
HCT: 35 % — ABNORMAL LOW (ref 39.0–52.0)
Hemoglobin: 11.6 g/dL — ABNORMAL LOW (ref 13.0–17.0)
MCH: 28.5 pg (ref 26.0–34.0)
MCHC: 33.1 g/dL (ref 30.0–36.0)
MCV: 86 fL (ref 80.0–100.0)
Platelets: 278 10*3/uL (ref 150–400)
RBC: 4.07 MIL/uL — ABNORMAL LOW (ref 4.22–5.81)
RDW: 15.8 % — ABNORMAL HIGH (ref 11.5–15.5)
WBC: 10.4 10*3/uL (ref 4.0–10.5)
nRBC: 0 % (ref 0.0–0.2)

## 2020-10-20 LAB — BASIC METABOLIC PANEL
Anion gap: 10 (ref 5–15)
BUN: 17 mg/dL (ref 8–23)
CO2: 20 mmol/L — ABNORMAL LOW (ref 22–32)
Calcium: 8.5 mg/dL — ABNORMAL LOW (ref 8.9–10.3)
Chloride: 106 mmol/L (ref 98–111)
Creatinine, Ser: 0.96 mg/dL (ref 0.61–1.24)
GFR, Estimated: >60 mL/min (ref >60)
Glucose, Bld: 107 mg/dL — ABNORMAL HIGH (ref 70–99)
Potassium: 3.7 mmol/L (ref 3.5–5.1)
Sodium: 136 mmol/L (ref 135–145)

## 2020-10-20 LAB — MAGNESIUM: Magnesium: 1.9 mg/dL (ref 1.7–2.4)

## 2020-10-20 MED ORDER — AMLODIPINE BESYLATE 10 MG PO TABS
10.0000 mg | ORAL_TABLET | Freq: Every day | ORAL | Status: DC
  Administered 2020-10-20 – 2020-10-21 (×2): 10 mg via ORAL
  Filled 2020-10-20 (×2): qty 1

## 2020-10-20 MED ORDER — CLOPIDOGREL BISULFATE 75 MG PO TABS
75.0000 mg | ORAL_TABLET | Freq: Every day | ORAL | Status: DC
  Administered 2020-10-21: 75 mg via ORAL
  Filled 2020-10-20: qty 1

## 2020-10-20 MED ORDER — PHENYLEPHRINE HCL 0.5 % NA SOLN
2.0000 [drp] | Freq: Four times a day (QID) | NASAL | Status: DC | PRN
  Administered 2020-10-20: 2 [drp] via NASAL
  Filled 2020-10-20: qty 15

## 2020-10-20 MED ORDER — CLOPIDOGREL BISULFATE 75 MG PO TABS
300.0000 mg | ORAL_TABLET | Freq: Once | ORAL | Status: AC
  Administered 2020-10-20: 300 mg via ORAL
  Filled 2020-10-20: qty 4

## 2020-10-20 MED ORDER — HYDROCODONE-ACETAMINOPHEN 5-325 MG PO TABS
1.0000 | ORAL_TABLET | Freq: Four times a day (QID) | ORAL | Status: DC | PRN
  Administered 2020-10-20 – 2020-10-21 (×4): 1 via ORAL
  Filled 2020-10-20 (×4): qty 1

## 2020-10-20 NOTE — Progress Notes (Signed)
Chaplain responded to consult for emotional and spiritual support. Chaplain visited with patient and listened to his story. Patient lost his wife a month ago and is very emotional when talking about it. He feels like he is at a crossroads in his life and no life partner to discuss it with. Chaplain provided spiritual support, listening and emotional support. Patient is hoping to go home today.   10/20/20 1200  Clinical Encounter Type  Visited With Patient  Visit Type Spiritual support  Referral From Nurse  Consult/Referral To Chaplain  Spiritual Encounters  Spiritual Needs Prayer;Emotional  Stress Factors  Patient Stress Factors Health changes

## 2020-10-20 NOTE — Progress Notes (Signed)
PROGRESS NOTE    Richard Webb   RJJ:[id number]  DOB: [birth year]  DOA: 10/16/2020     3  PCP: [name], MD  CC: CP/SOB  Hospital Course: Mr. Cerone is a 74 yo male with PMH pulmonary sarcoidosis (follows with pulm), CAD, HTN, UC, GERD, asthma, diverticulitis, BPH with LUTS s/p urolift (now with [facility]). He presented to [facility] with CP and was transferred to [facility] for an NSTEMI. He underwent a DES to the proximal LAD on 10/19/20.    Workup also notable for a CTA chest done on 10/17/20 which showed a small PE involving the LLL.  He was also evaluated by pulmonology and it was ruled out that there was any acute component involving his sarcoidosis.   He was not recommended for any treatment and will follow up outpatient with pulmonology (however, of note his previous pulmonologist has since retired and he will need to establish new care with a different provider).  He was trialed on triple therapy but developed a nosebleed on the morning of 10/20/2020.  Therefore, aspirin was discontinued and he was planned to continue on Brilinta and Eliquis for 1 month then transitioning Brilinta to Plavix with a load and continuing on dual therapy with Plavix and Eliquis.   Interval History:  Having a nosebleed this morning.  He was started on Afrin nasal spray.  Aspirin was discontinued, see discussion below. He otherwise had no other events overnight and was talking with cardiac rehab this morning.  ROS: Constitutional: negative for chills and fevers, Respiratory: negative for cough and wheezing, Cardiovascular: negative for chest pain and Gastrointestinal: negative for abdominal pain  Assessment & Plan: NSTEMI CAD - s/p cath on 3/1; now with new DES to prox LAD (hx underlying CAD as well) - due to nosebleed on triple therapy, plan is brilinta x 1 month then transition to plavix with 300 mg load  PE - noted on CTA chest on 2/27; small focus in LLL; likely not causing his sxms; etiology may be due to  underlying sarcoidosis -Lower extremity duplex negative -Continue on Eliquis.  Of note patient also on antiplatelet therapy, see discussion above  BPH with LUTS - s/p urolift; recent urology note reviewed from 06/2019 - he did not tolerate myrbertiq per urology - patient and family now also reporting intermittent hematuria at home PTA; need to watch this while on DAPT/anticoagulation; family had a lot of concerns; tried discussing that this would need to be monitored for potential worsening at this time as risk of stopping treatment for stent outweighs benefit   Sarcoidosis - follows with pulm; seen inpatient; no concern for acute flare - his pulmonologist has retired and will to establish with new provider at discharge - seen PE discussion as well  Hx Diverticulitis - was to undergo partial colectomy in March as well but this is now postponed 2/2 stent placement   Old records reviewed in assessment of this patient  Antimicrobials:   DVT prophylaxis: SCD's Start: 10/19/20 1304 SCDs Start: 10/17/20 0434  Eliquis started afternoon of 3/1 apixaban (ELIQUIS) tablet 10 mg  apixaban (ELIQUIS) tablet 5 mg   Code Status:   Code Status: Full Code Family Communication: sons and daughter in law  Disposition Plan: Status is: Inpatient  Remains inpatient appropriate because:Ongoing diagnostic testing needed not appropriate for outpatient work up, IV treatments appropriate due to intensity of illness or inability to take PO and Inpatient level of care appropriate due to severity of illness   Dispo: The patient is  from: Home              Anticipated d/c is to: Home              Patient currently is not medically stable to d/c.   Difficult to place patient No   Risk of unplanned readmission score: Unplanned Admission- Pilot do not use: 18.88   Objective: Blood pressure (!) 161/72, pulse 80, temperature 97.8 F (36.6 C), temperature source Oral, resp. rate 17, height 6' (1.829 m),  weight 99.6 kg, SpO2 95 %.  Examination: General appearance: alert, cooperative and no distress Head: Normocephalic, without obvious abnormality, atraumatic Eyes: EOMI  Nose: right nostril noted with red mucous membrane and blood in canal  Lungs: scattered coarse sounds bilaterally; no wheezing Heart: regular rate and rhythm and S1, S2 normal Abdomen: normal findings: bowel sounds normal and soft, non-tender Extremities: no edema Skin: mobility and turgor normal Neurologic: Grossly normal  Consultants:   Cardiology  Pulm  Procedures:   LHC, 10/19/20  Data Reviewed: I have personally reviewed following labs and imaging studies Results for orders placed or performed during the hospital encounter of 10/16/20 (from the past 24 hour(s))  Glucose, capillary     Status: Abnormal   Collection Time: 10/19/20  9:32 PM  Result Value Ref Range   Glucose-Capillary 110 (H) 70 - 99 mg/dL  Basic metabolic panel     Status: Abnormal   Collection Time: 10/20/20  2:18 AM  Result Value Ref Range   Sodium 136 135 - 145 mmol/L   Potassium 3.7 3.5 - 5.1 mmol/L   Chloride 106 98 - 111 mmol/L   CO2 20 (L) 22 - 32 mmol/L   Glucose, Bld 107 (H) 70 - 99 mg/dL   BUN 17 8 - 23 mg/dL   Creatinine, Ser 0.96 0.61 - 1.24 mg/dL   Calcium 8.5 (L) 8.9 - 10.3 mg/dL   GFR, Estimated >60 >60 mL/min   Anion gap 10 5 - 15  CBC     Status: Abnormal   Collection Time: 10/20/20  2:18 AM  Result Value Ref Range   WBC 10.4 4.0 - 10.5 K/uL   RBC 4.07 (L) 4.22 - 5.81 MIL/uL   Hemoglobin 11.6 (L) 13.0 - 17.0 g/dL   HCT 35.0 (L) 39.0 - 52.0 %   MCV 86.0 80.0 - 100.0 fL   MCH 28.5 26.0 - 34.0 pg   MCHC 33.1 30.0 - 36.0 g/dL   RDW 15.8 (H) 11.5 - 15.5 %   Platelets 278 150 - 400 K/uL   nRBC 0.0 0.0 - 0.2 %  Magnesium     Status: None   Collection Time: 10/20/20  2:18 AM  Result Value Ref Range   Magnesium 1.9 1.7 - 2.4 mg/dL  Glucose, capillary     Status: Abnormal   Collection Time: 10/20/20  7:53 AM  Result  Value Ref Range   Glucose-Capillary 162 (H) 70 - 99 mg/dL  Glucose, capillary     Status: Abnormal   Collection Time: 10/20/20 11:35 AM  Result Value Ref Range   Glucose-Capillary 102 (H) 70 - 99 mg/dL  Glucose, capillary     Status: None   Collection Time: 10/20/20  3:45 PM  Result Value Ref Range   Glucose-Capillary 94 70 - 99 mg/dL    Recent Results (from the past 240 hour(s))  SARS CORONAVIRUS 2 (TAT 6-24 HRS) Nasopharyngeal Nasopharyngeal Swab     Status: None   Collection Time: 10/17/20  4:00 AM  Specimen: Nasopharyngeal Swab  Result Value Ref Range Status   SARS Coronavirus 2 NEGATIVE NEGATIVE Final    Comment: (NOTE) SARS-CoV-2 target nucleic acids are NOT DETECTED.  The SARS-CoV-2 RNA is generally detectable in upper and lower respiratory specimens during the acute phase of infection. Negative results do not preclude SARS-CoV-2 infection, do not rule out co-infections with other pathogens, and should not be used as the sole basis for treatment or other patient management decisions. Negative results must be combined with clinical observations, patient history, and epidemiological information. The expected result is Negative.  Fact Sheet for Patients: SugarRoll.be  Fact Sheet for Healthcare Providers: https://www.woods-mathews.com/  This test is not yet approved or cleared by the [place] FDA and  has been authorized for detection and/or diagnosis of SARS-CoV-2 by FDA under an Emergency Use Authorization (EUA). This EUA will remain  in effect (meaning this test can be used) for the duration of the COVID-19 declaration under Se ction 564(b)(1) of the Act, 21 U.S.C. section 360bbb-3(b)(1), unless the authorization is terminated or revoked sooner.  Performed at [facility], [place] [address], [place], [place] [address]   MRSA PCR Screening     Status: None   Collection Time: 10/17/20  5:48 PM   Specimen:  Nasopharyngeal  Result Value Ref Range Status   MRSA by PCR NEGATIVE NEGATIVE Final    Comment:        The GeneXpert MRSA Assay (FDA approved for NASAL specimens only), is one component of a comprehensive MRSA colonization surveillance program. It is not intended to diagnose MRSA infection nor to guide or monitor treatment for MRSA infections. Performed at [facility], [address] [place], [place] [address]   C Difficile Quick Screen w PCR reflex     Status: None   Collection Time: 10/18/20  7:28 PM   Specimen: STOOL  Result Value Ref Range Status   C Diff antigen NEGATIVE NEGATIVE Final   C Diff toxin NEGATIVE NEGATIVE Final   C Diff interpretation No C. difficile detected.  Final    Comment: Performed at [facility], [place] [address] [place],  [address]     Radiology Studies: CARDIAC CATHETERIZATION  Result Date: 10/19/2020  Prox LAD lesion is 90% stenosed.  Dist LM lesion is 30% stenosed.  2nd Diag lesion is 70% stenosed.  Mid LAD lesion is 50% stenosed.  Dist LAD-1 lesion is 90% stenosed.  Dist LAD-2 lesion is 80% stenosed.  Post intervention, there is a 0% residual stenosis.  2nd Mrg lesion is 50% stenosed.  Mid Cx to Dist Cx lesion is 70% stenosed.  Previously placed Prox Cx to Mid Cx stent (unknown type) is widely patent.  Ost Cx to Prox Cx lesion is 20% stenosed.  Previously placed Ost RCA to Prox RCA stent (unknown type) is widely patent.  Previously placed Dist RCA stent (unknown type) is widely patent.  Prox RCA to Mid RCA lesion is 50% stenosed.  Mid RCA-1 lesion is 50% stenosed.  Previously placed Mid RCA-2 stent (unknown type) is widely patent.  RV Branch lesion is 95% stenosed.  RPDA lesion is 70% stenosed.  RPAV lesion is 50% stenosed.  A stent was successfully placed.  Significant three-vessel CAD with previously placed stents in the circumflex, and RCA which remain patent. The LAD has 30% smooth ostial narrowing followed by 90%  proximal LAD stenosis before the initial septal perforating artery.  The mid LAD and diagonal vessel is previously noted 70 and  50% stenoses.  There is diffuse distal apical LAD disease of 90 and 80%. The circumflex stent is widely patent.  There is mild proximal 20% narrowing.  There is mid AV groove circumflex stenoses of 70% with 50% OM 2 stenosis. The RCA has patent stents at the ostium, proximal to the acute margin, and prior to the PDA vessel.  There is previously noted 50% stenoses slightly improved from previously in the mid RCA with old 95% ostial stenosis and a small marginal branch.  The PDA is small and has ostial 70% stenosis and there is 50% continuation branch stenosis in the distal RCA. Successful PCI with DES stenting of the proximal LAD with ultimate insertion of a 4.0 x 15 mm Resolute Onyx stent postdilated with stent taper from 4.45 to 4.20 mm with residual narrowing 0% and brisk TIMI-3 flow. LVEDP 13 mm. RECOMMENDATION: DAPT therapy ideally for greater than 1 year.  The patient will have to defer his planned upcoming surgery and possibly this can be done after 6 months if he remains stable.  Recommend increase medical therapy for concomitant CAD with increase nitrates, beta-blocker therapy, consider initiation of ranolazine.  Continue aggressive lipid-lowering therapy with PCSK9 inhibition.   VAS [place] LOWER EXTREMITY VENOUS (DVT)  Result Date: 10/19/2020  Lower Venous DVT Study Indications: Pulmonary embolism.  Comparison Study: 04/19/20 previous Performing Technologist: Abram Sander RVS  Examination Guidelines: A complete evaluation includes B-mode imaging, spectral Doppler, color Doppler, and power Doppler as needed of all accessible portions of each vessel. Bilateral testing is considered an integral part of a complete examination. Limited examinations for reoccurring indications may be performed as noted. The reflux portion of the exam is performed with the patient in reverse Trendelenburg.   +---------+---------------+---------+-----------+----------+--------------+ RIGHT    CompressibilityPhasicitySpontaneityPropertiesThrombus Aging +---------+---------------+---------+-----------+----------+--------------+ CFV      Full           Yes      Yes                                 +---------+---------------+---------+-----------+----------+--------------+ SFJ      Full                                                        +---------+---------------+---------+-----------+----------+--------------+ FV Prox  Full                                                        +---------+---------------+---------+-----------+----------+--------------+ FV Mid   Full                                                        +---------+---------------+---------+-----------+----------+--------------+ FV DistalFull                                                        +---------+---------------+---------+-----------+----------+--------------+  PFV      Full                                                        +---------+---------------+---------+-----------+----------+--------------+ POP      Full           Yes      Yes                                 +---------+---------------+---------+-----------+----------+--------------+ PTV      Full                                                        +---------+---------------+---------+-----------+----------+--------------+ PERO     Full                                                        +---------+---------------+---------+-----------+----------+--------------+   +---------+---------------+---------+-----------+----------+--------------+ LEFT     CompressibilityPhasicitySpontaneityPropertiesThrombus Aging +---------+---------------+---------+-----------+----------+--------------+ CFV      Full           Yes      Yes                                  +---------+---------------+---------+-----------+----------+--------------+ SFJ      Full                                                        +---------+---------------+---------+-----------+----------+--------------+ FV Prox  Full                                                        +---------+---------------+---------+-----------+----------+--------------+ FV Mid   Full                                                        +---------+---------------+---------+-----------+----------+--------------+ FV DistalFull                                                        +---------+---------------+---------+-----------+----------+--------------+ PFV      Full                                                        +---------+---------------+---------+-----------+----------+--------------+  POP      Full           Yes      Yes                                 +---------+---------------+---------+-----------+----------+--------------+ PTV      Full                                                        +---------+---------------+---------+-----------+----------+--------------+ PERO     Full                                                        +---------+---------------+---------+-----------+----------+--------------+     Summary: BILATERAL: - No evidence of deep vein thrombosis seen in the lower extremities, bilaterally. - No evidence of superficial venous thrombosis in the lower extremities, bilaterally. -No evidence of popliteal cyst, bilaterally.   *See table(s) above for measurements and observations. Electronically signed by Curt Jews MD on 10/19/2020 at 7:18:23 PM.    Final    VAS [place] LOWER EXTREMITY VENOUS (DVT)  Final Result    CT ANGIO CHEST PE W OR WO CONTRAST  Final Result    DG Chest Port 1 View  Final Result      Scheduled Meds: . amLODipine  10 mg Oral Daily  . apixaban  10 mg Oral BID   Followed by  . [START ON 10/26/2020] apixaban  5  mg Oral BID  . atorvastatin  20 mg Oral q1800  . carbidopa-levodopa  1 tablet Oral QHS  . Chlorhexidine Gluconate Cloth  6 each Topical Daily  . clopidogrel  300 mg Oral Once  . [START ON 10/21/2020] clopidogrel  75 mg Oral Daily  . finasteride  5 mg Oral Daily  . fluticasone furoate-vilanterol  1 puff Inhalation Daily  . insulin aspart  0-5 Units Subcutaneous QHS  . insulin aspart  0-9 Units Subcutaneous TID WC  . isosorbide mononitrate  30 mg Oral Daily  . losartan  50 mg Oral Daily  . metoprolol tartrate  25 mg Oral BID  . montelukast  10 mg Oral QHS  . pantoprazole  40 mg Oral BID  . sodium chloride flush  3 mL Intravenous Q12H   PRN Meds: sodium chloride, diazepam, HYDROcodone-acetaminophen, loperamide, nitroGLYCERIN, ondansetron (ZOFRAN) IV, phenylephrine, sodium chloride flush, temazepam Continuous Infusions: . sodium chloride       LOS: 3 days  Time spent: Greater than 50% of the 35 minute visit was spent in counseling/coordination of care for the patient as laid out in the A&P.   Dwyane Dee, MD [facility] 10/20/2020, 4:24 PM

## 2020-10-20 NOTE — Progress Notes (Addendum)
The patient has been seen in conjunction with Reino Bellis, NP. All aspects of care have been considered and discussed. The patient has been personally interviewed, examined, and all clinical data has been reviewed.   Already having nose bleeds on triple therapy. Decided to stop aspirin.  Ambulated with no angina or dyspnea.  Pulmonary Consultant felt PE warranted therapy and so Eliquis added to ASA and Brilinta. Will DC aspirin and go with Brilinta and Eliquis for 3-4 weeks then decrease intensity by dropping Brilinta and starting Clopidogrel with a 300 mg load at time of last Brilinta dose no later than 11/19/2020.  Okay to DC from our standpoint.  F/U with Dr. Domenic Polite in 1-2 weeks.  Report any bleeding.   Progress Note  Patient Name: Richard Webb Date of Encounter: 10/20/2020  [facility] Cardiologist: Rozann Lesches, MD   Subjective   Sitting up in chair, no chest pain. Right sided nosebleed this morning.   Inpatient Medications    Scheduled Meds: . apixaban  10 mg Oral BID   Followed by  . [START ON 10/26/2020] apixaban  5 mg Oral BID  . aspirin EC  81 mg Oral Daily  . atorvastatin  20 mg Oral q1800  . carbidopa-levodopa  1 tablet Oral QHS  . Chlorhexidine Gluconate Cloth  6 each Topical Daily  . finasteride  5 mg Oral Daily  . fluticasone furoate-vilanterol  1 puff Inhalation Daily  . insulin aspart  0-5 Units Subcutaneous QHS  . insulin aspart  0-9 Units Subcutaneous TID WC  . isosorbide mononitrate  30 mg Oral Daily  . losartan  50 mg Oral Daily  . metoprolol tartrate  25 mg Oral BID  . montelukast  10 mg Oral QHS  . pantoprazole  40 mg Oral BID  . sodium chloride flush  3 mL Intravenous Q12H  . ticagrelor  90 mg Oral BID   Continuous Infusions: . sodium chloride     PRN Meds: sodium chloride, acetaminophen, diazepam, loperamide, nitroGLYCERIN, ondansetron (ZOFRAN) IV, phenylephrine, sodium chloride flush, temazepam   Vital Signs    Vitals:    10/19/20 1910 10/19/20 2355 10/20/20 0521 10/20/20 0749  BP: (!) 143/75 129/67 (!) 141/61 (!) 152/62  Pulse: 77 70 69 83  Resp: 16 16 17 17   Temp: 98.3 F (36.8 C) 98.5 F (36.9 C) 97.7 F (36.5 C) 97.8 F (36.6 C)  TempSrc: Oral Oral Oral Oral  SpO2: 97% 97% 96% 95%  Weight:   99.6 kg   Height:        Intake/Output Summary (Last 24 hours) at 10/20/2020 0848 Last data filed at 10/20/2020 0522 Gross per 24 hour  Intake 2262.39 ml  Output 1050 ml  Net 1212.39 ml   Last 3 Weights 10/20/2020 10/19/2020 10/18/2020  Weight (lbs) 219 lb 9.6 oz 224 lb 223 lb 15.8 oz  Weight (kg) 99.61 kg 101.606 kg 101.6 kg      Telemetry    SR with PVCs-70-80s - Personally Reviewed  ECG    SR with 1st degree AVB, PVCs - Personally Reviewed  Physical Exam   GEN: No acute distress.   Neck: No JVD Cardiac: RRR, no murmurs, rubs, or gallops.  Respiratory: Clear to auscultation bilaterally. GI: Soft, nontender, non-distended  MS: No edema; No deformity. Right radial cath site stable. Neuro:  Nonfocal  Psych: Normal affect   Labs    High Sensitivity Troponin:   Recent Labs  Lab 10/17/20 0236 10/17/20 0448 10/17/20 3491 10/17/20 7915 10/17/20 1138  TROPONINIHS 66* 137* 178* 207* 196*      Chemistry Recent Labs  Lab 10/16/20 2215 10/17/20 0448 10/18/20 0536 10/20/20 0218  NA 133* 135 139 136  K 4.0 3.8 3.6 3.7  CL 99 104 104 106  CO2 21* 21* 24 20*  GLUCOSE 306* 189* 113* 107*  BUN 17 18 21 17   CREATININE 1.07 0.83 0.95 0.96  CALCIUM 9.0 9.1 9.1 8.5*  PROT 7.8 7.6 6.6  --   ALBUMIN 4.0 3.9 3.6  --   AST 26 19 19   --   ALT 12 14 17   --   ALKPHOS 120 110 71  --   BILITOT 0.8 0.6 0.7  --   GFRNONAA >60 >60 >60 >60  ANIONGAP 13 10 11 10      Hematology Recent Labs  Lab 10/18/20 0536 10/19/20 0531 10/20/20 0218  WBC 11.2* 9.7 10.4  RBC 4.27 4.60 4.07*  HGB 12.0* 12.8* 11.6*  HCT 38.2* 40.9 35.0*  MCV 89.5 88.9 86.0  MCH 28.1 27.8 28.5  MCHC 31.4 31.3 33.1  RDW  15.9* 15.8* 15.8*  PLT 300 309 278    BNPNo results for input(s): BNP, PROBNP in the last 168 hours.   DDimer No results for input(s): DDIMER in the last 168 hours.   Radiology    CARDIAC CATHETERIZATION  Result Date: 10/19/2020  Prox LAD lesion is 90% stenosed.  Dist LM lesion is 30% stenosed.  2nd Diag lesion is 70% stenosed.  Mid LAD lesion is 50% stenosed.  Dist LAD-1 lesion is 90% stenosed.  Dist LAD-2 lesion is 80% stenosed.  Post intervention, there is a 0% residual stenosis.  2nd Mrg lesion is 50% stenosed.  Mid Cx to Dist Cx lesion is 70% stenosed.  Previously placed Prox Cx to Mid Cx stent (unknown type) is widely patent.  Ost Cx to Prox Cx lesion is 20% stenosed.  Previously placed Ost RCA to Prox RCA stent (unknown type) is widely patent.  Previously placed Dist RCA stent (unknown type) is widely patent.  Prox RCA to Mid RCA lesion is 50% stenosed.  Mid RCA-1 lesion is 50% stenosed.  Previously placed Mid RCA-2 stent (unknown type) is widely patent.  RV Branch lesion is 95% stenosed.  RPDA lesion is 70% stenosed.  RPAV lesion is 50% stenosed.  A stent was successfully placed.  Significant three-vessel CAD with previously placed stents in the circumflex, and RCA which remain patent. The LAD has 30% smooth ostial narrowing followed by 90% proximal LAD stenosis before the initial septal perforating artery.  The mid LAD and diagonal vessel is previously noted 70 and 50% stenoses.  There is diffuse distal apical LAD disease of 90 and 80%. The circumflex stent is widely patent.  There is mild proximal 20% narrowing.  There is mid AV groove circumflex stenoses of 70% with 50% OM 2 stenosis. The RCA has patent stents at the ostium, proximal to the acute margin, and prior to the PDA vessel.  There is previously noted 50% stenoses slightly improved from previously in the mid RCA with old 95% ostial stenosis and a small marginal branch.  The PDA is small and has ostial 70% stenosis  and there is 50% continuation branch stenosis in the distal RCA. Successful PCI with DES stenting of the proximal LAD with ultimate insertion of a 4.0 x 15 mm Resolute Onyx stent postdilated with stent taper from 4.45 to 4.20 mm with residual narrowing 0% and brisk TIMI-3 flow. LVEDP 13 mm. RECOMMENDATION: DAPT  therapy ideally for greater than 1 year.  The patient will have to defer his planned upcoming surgery and possibly this can be done after 6 months if he remains stable.  Recommend increase medical therapy for concomitant CAD with increase nitrates, beta-blocker therapy, consider initiation of ranolazine.  Continue aggressive lipid-lowering therapy with PCSK9 inhibition.   VAS [place] LOWER EXTREMITY VENOUS (DVT)  Result Date: 10/19/2020  Lower Venous DVT Study Indications: Pulmonary embolism.  Comparison Study: 04/19/20 previous Performing Technologist: Abram Sander RVS  Examination Guidelines: A complete evaluation includes B-mode imaging, spectral Doppler, color Doppler, and power Doppler as needed of all accessible portions of each vessel. Bilateral testing is considered an integral part of a complete examination. Limited examinations for reoccurring indications may be performed as noted. The reflux portion of the exam is performed with the patient in reverse Trendelenburg.  +---------+---------------+---------+-----------+----------+--------------+ RIGHT    CompressibilityPhasicitySpontaneityPropertiesThrombus Aging +---------+---------------+---------+-----------+----------+--------------+ CFV      Full           Yes      Yes                                 +---------+---------------+---------+-----------+----------+--------------+ SFJ      Full                                                        +---------+---------------+---------+-----------+----------+--------------+ FV Prox  Full                                                         +---------+---------------+---------+-----------+----------+--------------+ FV Mid   Full                                                        +---------+---------------+---------+-----------+----------+--------------+ FV DistalFull                                                        +---------+---------------+---------+-----------+----------+--------------+ PFV      Full                                                        +---------+---------------+---------+-----------+----------+--------------+ POP      Full           Yes      Yes                                 +---------+---------------+---------+-----------+----------+--------------+ PTV      Full                                                        +---------+---------------+---------+-----------+----------+--------------+  PERO     Full                                                        +---------+---------------+---------+-----------+----------+--------------+   +---------+---------------+---------+-----------+----------+--------------+ LEFT     CompressibilityPhasicitySpontaneityPropertiesThrombus Aging +---------+---------------+---------+-----------+----------+--------------+ CFV      Full           Yes      Yes                                 +---------+---------------+---------+-----------+----------+--------------+ SFJ      Full                                                        +---------+---------------+---------+-----------+----------+--------------+ FV Prox  Full                                                        +---------+---------------+---------+-----------+----------+--------------+ FV Mid   Full                                                        +---------+---------------+---------+-----------+----------+--------------+ FV DistalFull                                                         +---------+---------------+---------+-----------+----------+--------------+ PFV      Full                                                        +---------+---------------+---------+-----------+----------+--------------+ POP      Full           Yes      Yes                                 +---------+---------------+---------+-----------+----------+--------------+ PTV      Full                                                        +---------+---------------+---------+-----------+----------+--------------+ PERO     Full                                                        +---------+---------------+---------+-----------+----------+--------------+  Summary: BILATERAL: - No evidence of deep vein thrombosis seen in the lower extremities, bilaterally. - No evidence of superficial venous thrombosis in the lower extremities, bilaterally. -No evidence of popliteal cyst, bilaterally.   *See table(s) above for measurements and observations. Electronically signed by Curt Jews MD on 10/19/2020 at 7:18:23 PM.    Final     Cardiac Studies   Cath: 10/19/20   Prox LAD lesion is 90% stenosed.  Dist LM lesion is 30% stenosed.  2nd Diag lesion is 70% stenosed.  Mid LAD lesion is 50% stenosed.  Dist LAD-1 lesion is 90% stenosed.  Dist LAD-2 lesion is 80% stenosed.  Post intervention, there is a 0% residual stenosis.  2nd Mrg lesion is 50% stenosed.  Mid Cx to Dist Cx lesion is 70% stenosed.  Previously placed Prox Cx to Mid Cx stent (unknown type) is widely patent.  Ost Cx to Prox Cx lesion is 20% stenosed.  Previously placed Ost RCA to Prox RCA stent (unknown type) is widely patent.  Previously placed Dist RCA stent (unknown type) is widely patent.  Prox RCA to Mid RCA lesion is 50% stenosed.  Mid RCA-1 lesion is 50% stenosed.  Previously placed Mid RCA-2 stent (unknown type) is widely patent.  RV Branch lesion is 95% stenosed.  RPDA lesion is 70%  stenosed.  RPAV lesion is 50% stenosed.  A stent was successfully placed.   Significant three-vessel CAD with previously placed stents in the circumflex, and RCA which remain patent.  The LAD has 30% smooth ostial narrowing followed by 90% proximal LAD stenosis before the initial septal perforating artery.  The mid LAD and diagonal vessel is previously noted 70 and 50% stenoses.  There is diffuse distal apical LAD disease of 90 and 80%.  The circumflex stent is widely patent.  There is mild proximal 20% narrowing.  There is mid AV groove circumflex stenoses of 70% with 50% OM 2 stenosis.  The RCA has patent stents at the ostium, proximal to the acute margin, and prior to the PDA vessel.  There is previously noted 50% stenoses slightly improved from previously in the mid RCA with old 95% ostial stenosis and a small marginal branch.  The PDA is small and has ostial 70% stenosis and there is 50% continuation branch stenosis in the distal RCA.  Successful PCI with DES stenting of the proximal LAD with ultimate insertion of a 4.0 x 15 mm Resolute Onyx stent postdilated with stent taper from 4.45 to 4.20 mm with residual narrowing 0% and brisk TIMI-3 flow.  LVEDP 13 mm.  RECOMMENDATION: DAPT therapy ideally for greater than 1 year.  The patient will have to defer his planned upcoming surgery and possibly this can be done after 6 months if he remains stable.  Recommend increase medical therapy for concomitant CAD with increase nitrates, beta-blocker therapy, consider initiation of ranolazine.  Continue aggressive lipid-lowering therapy with PCSK9 inhibition.  Diagnostic Dominance: Right    Intervention    Echo: 10/17/20  IMPRESSIONS    1. Left ventricular ejection fraction, by estimation, is 70 to 75%. The  left ventricle has hyperdynamic function. The left ventricle has no  regional wall motion abnormalities. There is mild left ventricular  hypertrophy of the basal-septal  segment. Left  ventricular diastolic parameters are consistent with Grade I diastolic  dysfunction (impaired relaxation).  2. Right ventricular systolic function is normal. The right ventricular  size is normal.  3. The mitral valve is normal in structure. Trivial mitral valve  regurgitation. No evidence of mitral stenosis.  4. The aortic valve is tricuspid. Aortic valve regurgitation is not  visualized. Mild aortic valve sclerosis is present, with no evidence of  aortic valve stenosis.   Patient Profile     75 y.o. male with PMH of CADwho was seen for the evaluation ofchest pain and h/o CAD (CAD DES x 3 RCA and DES Cfx 05/2010 [place], residual 50-70% dCfx in AV groove, 70% dLAD, 70% small D1, similar results on cath 2019, patent stents), primary hypertension, hyperlipidemia, statin intol, pulmonary hypertension, and sarcoidosis.   Assessment & Plan    1. NSTEMI/Chest pain: hsTn peaked at 207. Was placed on IV heparin and transferred to [facility] from [facility] for further work up with cardiac cath noted above with Dr. Claiborne Billings. Significant 3v CAD with patent stents in the Lcx and RCA. LAD with notable segmental disease of 30% follow by 90% pLAD stenosis and 90% apical lesion. Successful PCI/DES to the pLAD with TIMI 3 flow. Does have 95% stenosis in small marginal branch of the RCA.  -- placed on DAPT with ASA/Brilinta post cath with recommendations for aggressive medical therapy for residual disease -- of note the patient will also require treatment for PE per PCCM recommendations and currently on Eliquis 12m BID until 3/8, then reduced to 560mBID. Per Dr. SmTamala Julianill plan for triple therapy while in the hospital. Then stopping ASA. Then 30 days of Brilinta with transition from Brilinta to plavix.  2. Small LLL PE: seen by PCCM with recommendations for [place]  3. HTN: blood pressures are elevated this morning -- continue metoprolol 2534mID, losartan 66m18mily, Imdur 30mg69mly -- will  resume amlodipine 10mg 33my   4. 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 Evaluated by PCCM with no recommendations for active treatment   6. Diverticulitis: followed by surgery. Had plans for a partial colectomy 3/11. Dr. BranchHarl Bowied with Dr. AliciaLeighton Ruffas ok with postponing surgery given the need for cath and DAPT. This was relayed to the patient   7. Epistaxis: started this morning. Afrin ordered.    For questions or updates, please contact [facility] consult [email or web address] for contact info under        Signed, LindsaReino Bellis3/09/2020, 8:48 AM

## 2020-10-20 NOTE — Hospital Course (Addendum)
Mr. Newberry is a 75 yo male with PMH pulmonary sarcoidosis (follows with pulm), CAD, HTN, UC, GERD, asthma, diverticulitis, BPH with LUTS s/p urolift (now with [facility]). He presented to [facility] with CP and was transferred to [facility] for an NSTEMI. He underwent a DES to the proximal LAD on 10/19/20.    Workup also notable for a CTA chest done on 10/17/20 which showed a small PE involving the LLL.  He was also evaluated by pulmonology and it was ruled out that there was any acute component involving his sarcoidosis.   He was not recommended for any treatment and will follow up outpatient with pulmonology (however, of note his previous pulmonologist has since retired and he will need to establish new care with a different provider).  He was trialed on triple therapy but developed a nosebleed on the morning of 10/20/2020 followed by ongoing hematuria later in the afternoon.  Due to these events, aspirin was discontinued.  His Brilinta was transitioned to Plavix while in the hospital with a load prior to.  He was discharged to continue on Plavix and Eliquis.  He will still need ongoing monitoring intermittently outpatient for any worsening hematuria or other signs of bleeding while on anticoagulation/antiplatelet therapy.  Hemoglobin was 11 g/dL at time of discharge.

## 2020-10-20 NOTE — Plan of Care (Signed)

## 2020-10-20 NOTE — Progress Notes (Signed)
CARDIAC REHAB PHASE I   PRE:  Rate/Rhythm: 75 SR with PVC's   BP:  Supine: 152/62  Sitting:   Standing:    SaO2: 96 RA  MODE:  Ambulation: 430 ft   POST:  Rate/Rhythm: 93 SR PVC's  BP:  Supine:   Sitting: 161/76  Standing:    SaO2: 97 RA   Pt was reclining in bed with light nose bleed, nurse aware, pt still wanted to walk and amb 430 ft without c/o SOB, or chest pain. Pt did have light chronic back pain while walking, one 30 second rest break taken for back pain. Education on MI booklet, home exercise guidelines, radial site restrictions and heart healthy diet. Reinforced adherence to aspirin, Brilinta and NTG usage. Strong reinforcement on signs and symptoms of bleeding. Referred pt to [facility] phase 2 cardiac rehab (pt attended the program before and is eager to start). Pt verbalized understanding and all his questions were answered. Pt left in recliner with call bell in reach.   Walters Walker ACSM-EP 10/20/2020 9:28 AM

## 2020-10-20 NOTE — Progress Notes (Signed)
    Called by RN with patient having episode of hematuria. I talked with the patient and reviewed notes where he is followed by urology as an outpatient. Last seen in the office 09/22/20 with flex cystoscope noting enlarged prostate erythema at Urolift implant sites. Was restarted on Proscar 31m daily. Has had intermittent episodes of hematuria, but no worse.   Given his need for Eliquis load with PE and now stenting, in the setting of hematuria/epistaxis this morning discussed with attending. Will transition from Brilinta to Plavix with a 3018mload this evening and then 7584maily starting tomorrow. -- check CBC in am   Signed, LinReino BellisP-C 10/20/2020, 4:26 PM Pager: [phone]

## 2020-10-21 ENCOUNTER — Other Ambulatory Visit: Payer: Self-pay | Admitting: Internal Medicine

## 2020-10-21 LAB — CBC WITH DIFFERENTIAL/PLATELET
Abs Immature Granulocytes: 0.09 10*3/uL — ABNORMAL HIGH (ref 0.00–0.07)
Basophils Absolute: 0 10*3/uL (ref 0.0–0.1)
Basophils Relative: 0 %
Eosinophils Absolute: 0.6 10*3/uL — ABNORMAL HIGH (ref 0.0–0.5)
Eosinophils Relative: 4 %
HCT: 33.5 % — ABNORMAL LOW (ref 39.0–52.0)
Hemoglobin: 11 g/dL — ABNORMAL LOW (ref 13.0–17.0)
Immature Granulocytes: 1 %
Lymphocytes Relative: 12 %
Lymphs Abs: 1.5 10*3/uL (ref 0.7–4.0)
MCH: 28 pg (ref 26.0–34.0)
MCHC: 32.8 g/dL (ref 30.0–36.0)
MCV: 85.2 fL (ref 80.0–100.0)
Monocytes Absolute: 1.2 10*3/uL — ABNORMAL HIGH (ref 0.1–1.0)
Monocytes Relative: 10 %
Neutro Abs: 9.2 10*3/uL — ABNORMAL HIGH (ref 1.7–7.7)
Neutrophils Relative %: 73 %
Platelets: 277 10*3/uL (ref 150–400)
RBC: 3.93 MIL/uL — ABNORMAL LOW (ref 4.22–5.81)
RDW: 15.7 % — ABNORMAL HIGH (ref 11.5–15.5)
WBC: 12.5 10*3/uL — ABNORMAL HIGH (ref 4.0–10.5)
nRBC: 0 % (ref 0.0–0.2)

## 2020-10-21 LAB — BASIC METABOLIC PANEL
Anion gap: 10 (ref 5–15)
BUN: 13 mg/dL (ref 8–23)
CO2: 21 mmol/L — ABNORMAL LOW (ref 22–32)
Calcium: 8.6 mg/dL — ABNORMAL LOW (ref 8.9–10.3)
Chloride: 103 mmol/L (ref 98–111)
Creatinine, Ser: 0.98 mg/dL (ref 0.61–1.24)
GFR, Estimated: >60 mL/min (ref >60)
Glucose, Bld: 135 mg/dL — ABNORMAL HIGH (ref 70–99)
Potassium: 3.4 mmol/L — ABNORMAL LOW (ref 3.5–5.1)
Sodium: 134 mmol/L — ABNORMAL LOW (ref 135–145)

## 2020-10-21 LAB — GLUCOSE, CAPILLARY
Glucose-Capillary: 135 mg/dL — ABNORMAL HIGH (ref 70–99)
Glucose-Capillary: 84 mg/dL (ref 70–99)

## 2020-10-21 LAB — MAGNESIUM: Magnesium: 2 mg/dL (ref 1.7–2.4)

## 2020-10-21 MED ORDER — APIXABAN 5 MG PO TABS: ORAL_TABLET | ORAL | 3 refills | Status: DC

## 2020-10-21 MED ORDER — CLOPIDOGREL BISULFATE 75 MG PO TABS: 75.0000 mg | ORAL_TABLET | Freq: Every day | ORAL | 5 refills | Status: DC

## 2020-10-21 MED ORDER — POTASSIUM CHLORIDE CRYS ER 20 MEQ PO TBCR
40.0000 meq | EXTENDED_RELEASE_TABLET | Freq: Once | ORAL | Status: AC
  Administered 2020-10-21: 40 meq via ORAL
  Filled 2020-10-21: qty 2

## 2020-10-21 MED ORDER — METOPROLOL TARTRATE 25 MG PO TABS: 25.0000 mg | ORAL_TABLET | Freq: Two times a day (BID) | ORAL | 3 refills | Status: DC

## 2020-10-21 MED ORDER — FAMOTIDINE 20 MG PO TABS: 20.0000 mg | ORAL_TABLET | Freq: Every evening | ORAL | 3 refills | Status: DC

## 2020-10-21 MED FILL — CLOPIDOGREL 75 MG TABLET: 75 | 30 days supply | Qty: 30 | Fill #0

## 2020-10-21 MED FILL — ELIQUIS 5 MG TABLET: 5 | 30 days supply | Qty: 60 | Fill #0

## 2020-10-21 MED FILL — FAMOTIDINE 20 MG TABS: 20 | 30 days supply | Qty: 30 | Fill #0

## 2020-10-21 MED FILL — METOPROLOL TARTRATE 25 MG T: 25 | 30 days supply | Qty: 60 | Fill #0

## 2020-10-21 NOTE — Progress Notes (Signed)
CARDIAC REHAB PHASE I   PRE:  Rate/Rhythm: 77 SR     MODE:  Ambulation: 400 ft   POST:  Rate/Rhythm: 93 SR few PVCs  BP:  Supine:   Sitting: 153/77  Standing:    SaO2: 93%RA 0919-0950 Pt walked 400 ft with steady gait and no CP. Did c/o back pain which is chronic for him. RN aware. Reinforced that pt is now on plavix and not brilinta. No questions re ed done yesterday.   Graylon Good, RN BSN  10/21/2020 9:48 AM

## 2020-10-21 NOTE — Discharge Summary (Signed)
Physician Discharge Summary   Richard Webb UXL:[id number] DOB: 05-05-1946 DOA: 10/16/2020  PCP: [name], MD  Admit date: 10/16/2020 Discharge date: 10/21/2020   Admitted From: home Disposition:  home Discharging physician: Dwyane Dee, MD  Recommendations for Outpatient Follow-up:  1. Follow up with urology and cardiology 2. Repeat Hgb at follow up visit (mostly stable around 11g/dL at time of discharge)  Patient discharged to home in Discharge Condition: stable Risk of unplanned readmission score: Unplanned Admission- Pilot do not use: 17.37  CODE STATUS: Full Diet recommendation:  Diet Orders (From admission, onward)    Start     Ordered   10/21/20 0000  Diet - low sodium heart healthy        10/21/20 0924   10/19/20 1304  Diet Heart Room service appropriate? Yes; Fluid consistency: Thin  Diet effective now       Question Answer Comment  Room service appropriate? Yes   Fluid consistency: Thin      10/19/20 1303          Hospital Course: Mr. Gatliff is a 75 yo male with PMH pulmonary sarcoidosis (follows with pulm), CAD, HTN, UC, GERD, asthma, diverticulitis, BPH with LUTS s/p urolift (now with [facility]). He presented to [facility] with CP and was transferred to [facility] for an NSTEMI. He underwent a DES to the proximal LAD on 10/19/20.    Workup also notable for a CTA chest done on 10/17/20 which showed a small PE involving the LLL.  He was also evaluated by pulmonology and it was ruled out that there was any acute component involving his sarcoidosis.   He was not recommended for any treatment and will follow up outpatient with pulmonology (however, of note his previous pulmonologist has since retired and he will need to establish new care with a different provider).  He was trialed on triple therapy but developed a nosebleed on the morning of 10/20/2020 followed by ongoing hematuria later in the afternoon.  Due to these events, aspirin was discontinued.  His Brilinta was  transitioned to Plavix while in the hospital with a load prior to.  He was discharged to continue on Plavix and Eliquis.  He will still need ongoing monitoring intermittently outpatient for any worsening hematuria or other signs of bleeding while on anticoagulation/antiplatelet therapy.  Hemoglobin was 11 g/dL at time of discharge.  NSTEMI CAD - s/p cath on 3/1; now with new DES to prox LAD (hx underlying CAD as well) - due to nosebleed on triple therapy and ongoing hematuria (intermittent even prior to admission) he was changed in hospital from [place] to plavix s/p 300 mg load then 75 mg daily  PE - noted on CTA chest on 2/27; small focus in LLL; likely not causing his sxms; etiology may be due to underlying sarcoidosis -Lower extremity duplex negative -Continue on Eliquis.  Of note patient also on antiplatelet therapy, see discussion above  BPH with LUTS - s/p urolift; recent urology note reviewed from 06/2019 - he did not tolerate myrbertiq per urology - patient and family now also reporting intermittent hematuria at home PTA; need to watch this while on plavix and eliquis -Hematuria will need to be monitored outpatient; will need intermittent hemoglobin monitoring  Sarcoidosis - follows with pulm; seen inpatient; no concern for acute flare - his pulmonologist has retired and will to establish with new provider at discharge - seen PE discussion as well  Hx Diverticulitis - was to undergo partial colectomy in March as well but this  is now postponed 2/2 stent placement    The patient's chronic medical conditions were treated accordingly per the patient's home medication regimen except as noted.  On day of discharge, patient was felt deemed stable for discharge. Patient/family member advised to call PCP or come back to ER if needed.   Principal Diagnosis: NSTEMI (non-ST elevated myocardial infarction) [facility])  Discharge Diagnoses: Active Hospital Problems   Diagnosis Date Noted   . NSTEMI (non-ST elevated myocardial infarction) ([place]) 10/17/2020    Priority: High  . Pulmonary embolism ([place]) 10/18/2020    Priority: High  . Sarcoidosis 10/17/2020    Priority: Medium  . Essential hypertension 10/17/2020  . History of ulcerative colitis 10/17/2020  . GERD (gastroesophageal reflux disease) 10/17/2020  . Asthma 10/17/2020  . History of diverticulitis 10/17/2020  . Hyperglycemia 10/17/2020  . BPH (benign prostatic hyperplasia) 03/18/2020  . Coronary artery disease 10/10/2012    Resolved Hospital Problems   Diagnosis Date Noted Date Resolved  . Chest pain 10/17/2020 10/20/2020  . Elevated troponin 10/17/2020 10/20/2020  . Leukocytosis 10/17/2020 10/20/2020    Discharge Instructions    Amb Referral to Cardiac Rehabilitation   Complete by: As directed    [place] phase 2 cardiac rehab   Diagnosis:  NSTEMI Coronary Stents     After initial evaluation and assessments completed: Virtual Based Care may be provided alone or in conjunction with Phase 2 Cardiac Rehab based on patient barriers.: Yes   Diet - low sodium heart healthy   Complete by: As directed    Increase activity slowly   Complete by: As directed      Allergies as of 10/21/2020      Reactions   Bee Venom Anaphylaxis   Statins Other (See Comments)   Myalgias - Simvastatin and Rosuvastatin      Medication List    STOP taking these medications   aspirin EC 81 MG tablet   LORazepam 0.5 MG tablet Commonly known as: ATIVAN   omeprazole 20 MG capsule Commonly known as: PRILOSEC     TAKE these medications   amLODipine 10 MG tablet Commonly known as: NORVASC Take 10 mg by mouth daily.   apixaban 5 MG Tabs tablet Commonly known as: ELIQUIS Take 2 tablets twice daily until 3/7. On 3/8 start taking 1 tablet twice daily .   Breo Ellipta 200-25 MCG/INH Aepb Generic drug: fluticasone furoate-vilanterol Inhale 1 Inhaler into the lungs daily.   carbidopa-levodopa 10-100 MG  tablet Commonly known as: SINEMET IR Take 1 tablet by mouth every evening.   clopidogrel 75 MG tablet Commonly known as: PLAVIX Take 1 tablet (75 mg total) by mouth daily.   cyanocobalamin 1000 MCG/ML injection Commonly known as: (VITAMIN B-12) Inject 1,000 mcg into the muscle every 30 (thirty) days.   Dupilumab 300 MG/2ML Sopn Inject 300 mg into the skin every 14 (fourteen) days.   famotidine 20 MG tablet Commonly known as: PEPCID Take 1 tablet (20 mg total) by mouth every evening.   finasteride 5 MG tablet Commonly known as: PROSCAR Take 1 tablet (5 mg total) by mouth daily.   folic acid 1 MG tablet Commonly known as: FOLVITE Take 1 mg by mouth See admin instructions. Mon - Fri   hydrochlorothiazide 25 MG tablet Commonly known as: HYDRODIURIL Take 25 mg by mouth daily.   HYDROcodone-acetaminophen 5-325 MG tablet Commonly known as: NORCO/VICODIN Take 1 tablet by mouth every 6 (six) hours as needed for severe pain.   isosorbide mononitrate 30 MG 24 hr  tablet Commonly known as: IMDUR TAKE 1 TABLET BY MOUTH  DAILY   losartan 50 MG tablet Commonly known as: COZAAR Take 50 mg by mouth daily.   metoprolol tartrate 25 MG tablet Commonly known as: LOPRESSOR Take 1 tablet (25 mg total) by mouth 2 (two) times daily. What changed: how much to take   montelukast 10 MG tablet Commonly known as: SINGULAIR Take 10 mg by mouth at bedtime.   nitroGLYCERIN 0.4 MG SL tablet Commonly known as: NITROSTAT Place 1 tablet (0.4 mg total) under the tongue every 5 (five) minutes x 3 doses as needed for chest pain (if no relief after 3rd dose, proceed to the ED for an evaluation or call 911).   penicillin v potassium 500 MG tablet Commonly known as: VEETID Take by mouth See admin instructions. TAKE 2 TABLETS BY MOUTH STAT THEN 1 TABLET FOUR TIMES DAILY FOR 7 DAYS   potassium chloride 10 MEQ tablet Commonly known as: KLOR-CON Take 1 tablet (10 mEq total) by mouth daily.   Repatha  SureClick 563 MG/ML Soaj Generic drug: Evolocumab Inject 140 mg into the skin every 14 (fourteen) days.   sulfaSALAzine 500 MG tablet Commonly known as: AZULFIDINE Take 1,000 mg by mouth 2 (two) times daily.   temazepam 30 MG capsule Commonly known as: RESTORIL Take 30 mg by mouth at bedtime as needed for sleep.   Vitamin D (Ergocalciferol) 1.25 MG (50000 UNIT) Caps capsule Commonly known as: DRISDOL Take 50,000 Units by mouth every 7 (seven) days. Thursdays       Follow-up Information    [name], MD. Schedule an appointment as soon as possible for a visit in 1 week(s).   Specialty: Internal Medicine Why: Ask for hemoglobin check Contact information: [place] [address] [phone]        Verta Ellen., NP Follow up on 11/04/2020.   Specialty: Cardiology Why: at [facility] for your follow up appt Contact information: [place] [address] [phone]              Allergies  Allergen Reactions  . Bee Venom Anaphylaxis  . Statins Other (See Comments)    Myalgias - Simvastatin and Rosuvastatin    Consultations: Cardiology  Discharge Exam: BP (!) 153/77   Pulse 76   Temp 98.5 F (36.9 C) (Oral)   Resp 19   Ht 6' (1.829 m)   Wt 100.4 kg   SpO2 100%   BMI 30.03 kg/m  General appearance: alert, cooperative and no distress Head: Normocephalic, without obvious abnormality, atraumatic Eyes: EOMI  Nose: no further bleeding noted from nose Lungs: scattered coarse sounds bilaterally; no wheezing Heart: regular rate and rhythm and S1, S2 normal Abdomen: normal findings: bowel sounds normal and soft, non-tender Extremities: no edema Skin: mobility and turgor normal Neurologic: Grossly normal  The results of significant diagnostics from this hospitalization (including imaging, microbiology, ancillary and laboratory) are listed below for reference.   Microbiology: Recent Results (from the past 240 hour(s))  SARS  CORONAVIRUS 2 (TAT 6-24 HRS) Nasopharyngeal Nasopharyngeal Swab     Status: None   Collection Time: 10/17/20  4:00 AM   Specimen: Nasopharyngeal Swab  Result Value Ref Range Status   SARS Coronavirus 2 NEGATIVE NEGATIVE Final    Comment: (NOTE) SARS-CoV-2 target nucleic acids are NOT DETECTED.  The SARS-CoV-2 RNA is generally detectable in upper and lower respiratory specimens during the acute phase of infection. Negative results do not  preclude SARS-CoV-2 infection, do not rule out co-infections with other pathogens, and should not be used as the sole basis for treatment or other patient management decisions. Negative results must be combined with clinical observations, patient history, and epidemiological information. The expected result is Negative.  Fact Sheet for Patients: SugarRoll.be  Fact Sheet for Healthcare Providers: https://www.woods-mathews.com/  This test is not yet approved or cleared by the [place] FDA and  has been authorized for detection and/or diagnosis of SARS-CoV-2 by FDA under an Emergency Use Authorization (EUA). This EUA will remain  in effect (meaning this test can be used) for the duration of the COVID-19 declaration under Se ction 564(b)(1) of the Act, 21 U.S.C. section 360bbb-3(b)(1), unless the authorization is terminated or revoked sooner.  Performed at [facility], [place] [address] [place], [place] [address]   MRSA PCR Screening     Status: None   Collection Time: 10/17/20  5:48 PM   Specimen: Nasopharyngeal  Result Value Ref Range Status   MRSA by PCR NEGATIVE NEGATIVE Final    Comment:        The GeneXpert MRSA Assay (FDA approved for NASAL specimens only), is one component of a comprehensive MRSA colonization surveillance program. It is not intended to diagnose MRSA infection nor to guide or monitor treatment for MRSA infections. Performed at [facility], [address]  [place], [place] [address]   C Difficile Quick Screen w PCR reflex     Status: None   Collection Time: 10/18/20  7:28 PM   Specimen: STOOL  Result Value Ref Range Status   C Diff antigen NEGATIVE NEGATIVE Final   C Diff toxin NEGATIVE NEGATIVE Final   C Diff interpretation No C. difficile detected.  Final    Comment: Performed at [facility], [place] [address] [place], [place] [address]     Labs: BNP (last 3 results) No results for input(s): BNP in the last 8760 hours. Basic Metabolic Panel: Recent Labs  Lab 10/16/20 2215 10/17/20 0448 10/18/20 0536 10/20/20 0218 10/21/20 0305  NA 133* 135 139 136 134*  K 4.0 3.8 3.6 3.7 3.4*  CL 99 104 104 106 103  CO2 21* 21* 24 20* 21*  GLUCOSE 306* 189* 113* 107* 135*  BUN 17 18 21 17 13   CREATININE 1.07 0.83 0.95 0.96 0.98  CALCIUM 9.0 9.1 9.1 8.5* 8.6*  MG  --  2.2  --  1.9 2.0  PHOS  --  3.1  --   --   --    Liver Function Tests: Recent Labs  Lab 10/16/20 2215 10/17/20 0448 10/18/20 0536  AST 26 19 19   ALT 12 14 17   ALKPHOS 120 110 71  BILITOT 0.8 0.6 0.7  PROT 7.8 7.6 6.6  ALBUMIN 4.0 3.9 3.6   Recent Labs  Lab 10/16/20 2215  LIPASE 37   No results for input(s): AMMONIA in the last 168 hours. CBC: Recent Labs  Lab 10/17/20 0448 10/18/20 0536 10/19/20 0531 10/20/20 0218 10/21/20 0305  WBC 14.4* 11.2* 9.7 10.4 12.5*  NEUTROABS  --   --   --   --  9.2*  HGB 12.9* 12.0* 12.8* 11.6* 11.0*  HCT 40.0 38.2* 40.9 35.0* 33.5*  MCV 87.5 89.5 88.9 86.0 85.2  PLT 313 300 309 278 277   Cardiac Enzymes: No results for input(s): CKTOTAL, CKMB, CKMBINDEX, TROPONINI in the last 168 hours. BNP: Invalid input(s): POCBNP CBG: Recent Labs  Lab 10/20/20 1135 10/20/20 1545 10/20/20  2149 10/21/20 0630 10/21/20 0725  GLUCAP 102* 94 136* 84 135*   D-Dimer No results for input(s): DDIMER in the last 72 hours. Hgb A1c No results for input(s): HGBA1C in the last 72 hours. Lipid Profile Recent Labs    10/19/20 0531   CHOL 88  HDL 47  LDLCALC 3  TRIG 190*  CHOLHDL 1.9   Thyroid function studies Recent Labs    10/19/20 0531  TSH 2.119   Anemia work up No results for input(s): VITAMINB12, FOLATE, FERRITIN, TIBC, IRON, RETICCTPCT in the last 72 hours. Urinalysis    Component Value Date/Time   COLORURINE YELLOW 04/17/2020 0250   APPEARANCEUR Clear 09/22/2020 1718   LABSPEC 1.008 04/17/2020 0250   PHURINE 6.0 04/17/2020 0250   GLUCOSEU Negative 09/22/2020 1718   HGBUR SMALL (A) 04/17/2020 0250   BILIRUBINUR Negative 09/22/2020 1718   KETONESUR NEGATIVE 04/17/2020 0250   PROTEINUR Negative 09/22/2020 1718   PROTEINUR NEGATIVE 04/17/2020 0250   NITRITE Negative 09/22/2020 1718   NITRITE NEGATIVE 04/17/2020 0250   LEUKOCYTESUR Negative 09/22/2020 1718   LEUKOCYTESUR NEGATIVE 04/17/2020 0250   Sepsis Labs Invalid input(s): PROCALCITONIN,  WBC,  LACTICIDVEN Microbiology Recent Results (from the past 240 hour(s))  SARS CORONAVIRUS 2 (TAT 6-24 HRS) Nasopharyngeal Nasopharyngeal Swab     Status: None   Collection Time: 10/17/20  4:00 AM   Specimen: Nasopharyngeal Swab  Result Value Ref Range Status   SARS Coronavirus 2 NEGATIVE NEGATIVE Final    Comment: (NOTE) SARS-CoV-2 target nucleic acids are NOT DETECTED.  The SARS-CoV-2 RNA is generally detectable in upper and lower respiratory specimens during the acute phase of infection. Negative results do not preclude SARS-CoV-2 infection, do not rule out co-infections with other pathogens, and should not be used as the sole basis for treatment or other patient management decisions. Negative results must be combined with clinical observations, patient history, and epidemiological information. The expected result is Negative.  Fact Sheet for Patients: SugarRoll.be  Fact Sheet for Healthcare Providers: https://www.woods-mathews.com/  This test is not yet approved or cleared by the [place] FDA  and  has been authorized for detection and/or diagnosis of SARS-CoV-2 by FDA under an Emergency Use Authorization (EUA). This EUA will remain  in effect (meaning this test can be used) for the duration of the COVID-19 declaration under Se ction 564(b)(1) of the Act, 21 U.S.C. section 360bbb-3(b)(1), unless the authorization is terminated or revoked sooner.  Performed at [facility], [place] [address] [place], [place] [address]   MRSA PCR Screening     Status: None   Collection Time: 10/17/20  5:48 PM   Specimen: Nasopharyngeal  Result Value Ref Range Status   MRSA by PCR NEGATIVE NEGATIVE Final    Comment:        The GeneXpert MRSA Assay (FDA approved for NASAL specimens only), is one component of a comprehensive MRSA colonization surveillance program. It is not intended to diagnose MRSA infection nor to guide or monitor treatment for MRSA infections. Performed at [facility], [address] [place], [place] [address]   C Difficile Quick Screen w PCR reflex     Status: None   Collection Time: 10/18/20  7:28 PM   Specimen: STOOL  Result Value Ref Range Status   C Diff antigen NEGATIVE NEGATIVE Final   C Diff toxin NEGATIVE NEGATIVE Final   C Diff interpretation No C. difficile detected.  Final    Comment: Performed at [facility], [place]  [address] [place], [place] [address]    Procedures/Studies: CT ANGIO CHEST PE W OR WO CONTRAST  Result Date: 10/17/2020 CLINICAL DATA:  Onset of chest pain and shortness of breath last night. Abnormal chest x-ray. History of sarcoidosis, asthma and hypertension. Clinical suspicion of pulmonary embolism. EXAM: CT ANGIOGRAPHY CHEST WITH CONTRAST TECHNIQUE: Multidetector CT imaging of the chest was performed using the standard protocol during bolus administration of intravenous contrast. Multiplanar CT image reconstructions and MIPs were obtained to evaluate the vascular anatomy. CONTRAST:  124m OMNIPAQUE IOHEXOL 350 MG/ML SOLN  COMPARISON:  Chest radiographs 10/16/2020. Abdominal CT 02/15/2020 and 04/17/2020. FINDINGS: Cardiovascular: The pulmonary arteries are well opacified with contrast to the level of the subsegmental branches. There is a single occluded subsegmental branch in the superior segment of the left lower lobe (images 28 through 34 of series 4), consistent with pulmonary embolism. The pulmonary arteries are otherwise patent, and there are no central pulmonary emboli. There is central enlargement of the pulmonary arteries consistent with pulmonary arterial hypertension. There is moderate atherosclerosis of the aorta, great vessels and coronary arteries. The heart size is normal. There is no pericardial effusion. Mediastinum/Nodes: There are no enlarged mediastinal, hilar or axillary lymph nodes.There are scattered small partially calcified mediastinal and hilar lymph nodes bilaterally. The thyroid gland, trachea and esophagus demonstrate no significant findings. Lungs/Pleura: There is no pleural effusion or pneumothorax. There is extensive upper lobe predominant chronic lung disease with thickening of the bronchovascular bundles, peribronchovascular scarring and traction bronchiectasis, compatible with chronic sarcoidosis. The basilar components have progressed compared with the prior abdominal CT. Scattered mucous plugging is noted. There is no confluent airspace opacity or suspicious pulmonary nodule. Upper abdomen: Nodular parents of the liver again noted suspicious for cirrhosis. No adrenal mass. Musculoskeletal/Chest wall: There is no chest wall mass or suspicious osseous finding. Review of the MIP images confirms the above findings. IMPRESSION: 1. Single small focus of pulmonary embolism involving a subsegmental branch of the superior segment of the left lower lobe. This is of questionable significance regarding the patient's symptoms. No central pulmonary emboli. Consider lower extremity venous doppler ultrasound. 2.  Central enlargement of the pulmonary arteries consistent with pulmonary arterial hypertension. 3. 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 The basilar components have progressed compared with the prior abdominal CT and superimposed inflammation/infection is not completely excluded. No evidence of neoplasm. 4. Aortic Atherosclerosis (ICD10-I70.0). 5. Critical Value/emergent results were called by telephone at the time of interpretation on 10/17/2020 at 4:16 pm to provider MAletta Edouard who verbally acknowledged these results. Electronically Signed   By: WRichardean SaleM.D.   On: 10/17/2020 16:17   CARDIAC CATHETERIZATION  Result Date: 10/19/2020  Prox LAD lesion is 90% stenosed.  Dist LM lesion is 30% stenosed.  2nd Diag lesion is 70% stenosed.  Mid LAD lesion is 50% stenosed.  Dist LAD-1 lesion is 90% stenosed.  Dist LAD-2 lesion is 80% stenosed.  Post intervention, there is a 0% residual stenosis.  2nd Mrg lesion is 50% stenosed.  Mid Cx to Dist Cx lesion is 70% stenosed.  Previously placed Prox Cx to Mid Cx stent (unknown type) is widely patent.  Ost Cx to Prox Cx lesion is 20% stenosed.  Previously placed Ost RCA to Prox RCA stent (unknown type) is widely patent.  Previously placed Dist RCA stent (unknown type) is widely patent.  Prox RCA to Mid RCA lesion is 50% stenosed.  Mid RCA-1 lesion is 50% stenosed.  Previously placed Mid RCA-2  stent (unknown type) is widely patent.  RV Branch lesion is 95% stenosed.  RPDA lesion is 70% stenosed.  RPAV lesion is 50% stenosed.  A stent was successfully placed.  Significant three-vessel CAD with previously placed stents in the circumflex, and RCA which remain patent. The LAD has 30% smooth ostial narrowing followed by 90% proximal LAD stenosis before the initial septal perforating artery.  The mid LAD and diagonal vessel is previously noted 70 and 50% stenoses.  There is diffuse distal apical LAD disease of  90 and 80%. The circumflex stent is widely patent.  There is mild proximal 20% narrowing.  There is mid AV groove circumflex stenoses of 70% with 50% OM 2 stenosis. The RCA has patent stents at the ostium, proximal to the acute margin, and prior to the PDA vessel.  There is previously noted 50% stenoses slightly improved from previously in the mid RCA with old 95% ostial stenosis and a small marginal branch.  The PDA is small and has ostial 70% stenosis and there is 50% continuation branch stenosis in the distal RCA. Successful PCI with DES stenting of the proximal LAD with ultimate insertion of a 4.0 x 15 mm Resolute Onyx stent postdilated with stent taper from 4.45 to 4.20 mm with residual narrowing 0% and brisk TIMI-3 flow. LVEDP 13 mm. RECOMMENDATION: DAPT therapy ideally for greater than 1 year.  The patient will have to defer his planned upcoming surgery and possibly this can be done after 6 months if he remains stable.  Recommend increase medical therapy for concomitant CAD with increase nitrates, beta-blocker therapy, consider initiation of ranolazine.  Continue aggressive lipid-lowering therapy with PCSK9 inhibition.   DG Chest Port 1 View  Result Date: 10/16/2020 CLINICAL DATA:  Chest pain. EXAM: PORTABLE CHEST 1 VIEW COMPARISON:  None. FINDINGS: The heart size and mediastinal contours are within normal limits. No pneumothorax or pleural effusion is noted. Bilateral upper lobe opacities are noted which may represent pneumonia or possibly edema, but neoplasm cannot be excluded. The visualized skeletal structures are unremarkable. IMPRESSION: Bilateral upper lobe opacities are noted which may represent pneumonia or possibly edema, but neoplasm cannot be excluded. CT scan of the chest is recommended for further evaluation. Electronically Signed   By: Marijo Conception M.D.   On: 10/16/2020 22:45   ECHOCARDIOGRAM COMPLETE  Result Date: 10/17/2020    ECHOCARDIOGRAM REPORT   Patient Name:   NELDON SHEPARD Date of Exam: 10/17/2020 Medical Rec #:  [id number]        Height:       72.0 in Accession #:    [id number]       Weight:       214.9 lb Date of Birth:  [birth year]        BSA:          2.197 m Patient Age:    53 years         BP:           119/72 mmHg Patient Gender: M                HR:           60 bpm. Exam Location:  Inpatient Procedure: 2D Echo, Cardiac Doppler and Color Doppler Indications:    Dyspnea R06.00  History:        Patient has no prior history of Echocardiogram examinations and                 Patient has  prior history of Echocardiogram examinations, most                 recent 08/10/2020. CAD, Pulmonary HTN; Risk Factors:Dyslipidemia                 and Sleep Apnea. Sarcoidosis. GERD. Asthma.  Sonographer:    Darlina Sicilian RDCS Referring Phys: [place]  1. Left ventricular ejection fraction, by estimation, is 70 to 75%. The left ventricle has hyperdynamic function. The left ventricle has no regional wall motion abnormalities. There is mild left ventricular hypertrophy of the basal-septal segment. Left ventricular diastolic parameters are consistent with Grade I diastolic dysfunction (impaired relaxation).  2. Right ventricular systolic function is normal. The right ventricular size is normal.  3. The mitral valve is normal in structure. Trivial mitral valve regurgitation. No evidence of mitral stenosis.  4. The aortic valve is tricuspid. Aortic valve regurgitation is not visualized. Mild aortic valve sclerosis is present, with no evidence of aortic valve stenosis. FINDINGS  Left Ventricle: Left ventricular ejection fraction, by estimation, is 70 to 75%. The left ventricle has hyperdynamic function. The left ventricle has no regional wall motion abnormalities. The left ventricular internal cavity size was normal in size. There is mild left ventricular hypertrophy of the basal-septal segment. Left ventricular diastolic parameters are consistent with Grade I  diastolic dysfunction (impaired relaxation). Right Ventricle: The right ventricular size is normal. Right ventricular systolic function is normal. Left Atrium: Left atrial size was normal in size. Right Atrium: Right atrial size was normal in size. Pericardium: There is no evidence of pericardial effusion. Mitral Valve: The mitral valve is normal in structure. Mild mitral annular calcification. Trivial mitral valve regurgitation. No evidence of mitral valve stenosis. Tricuspid Valve: The tricuspid valve is normal in structure. Tricuspid valve regurgitation is trivial. No evidence of tricuspid stenosis. Aortic Valve: The aortic valve is tricuspid. Aortic valve regurgitation is not visualized. Mild aortic valve sclerosis is present, with no evidence of aortic valve stenosis. Pulmonic Valve: The pulmonic valve was normal in structure. Pulmonic valve regurgitation is trivial. No evidence of pulmonic stenosis. Aorta: The aortic root is normal in size and structure. Venous: The inferior vena cava was not well visualized. IAS/Shunts: The interatrial septum was not well visualized.  LEFT VENTRICLE PLAX 2D LVIDd:         4.44 cm  Diastology LVIDs:         2.35 cm  LV e' medial:    5.33 cm/s LV PW:         0.89 cm  LV E/e' medial:  13.6 LV IVS:        1.38 cm  LV e' lateral:   5.44 cm/s LVOT diam:     2.30 cm  LV E/e' lateral: 13.3 LV SV:         110 LV SV Index:   50 LVOT Area:     4.15 cm                          3D Volume EF:                         3D EF:        54 %                         LV EDV:       130 ml  LV ESV:       59 ml                         LV SV:        70 ml RIGHT VENTRICLE RV S prime:     9.03 cm/s TAPSE (M-mode): 2.0 cm LEFT ATRIUM             Index       RIGHT ATRIUM           Index LA diam:        3.80 cm 1.73 cm/m  RA Area:     10.90 cm LA Vol (A2C):   45.6 ml 20.76 ml/m RA Volume:   18.90 ml  8.60 ml/m LA Vol (A4C):   46.7 ml 21.26 ml/m LA Biplane Vol: 47.1 ml 21.44  ml/m  AORTIC VALVE LVOT Vmax:   115.00 cm/s LVOT Vmean:  87.500 cm/s LVOT VTI:    0.265 m  AORTA Ao Root diam: 3.70 cm Ao Asc diam:  3.40 cm MITRAL VALVE MV Area (PHT): 2.01 cm    SHUNTS MV Decel Time: 377 msec    Systemic VTI:  0.26 m MV E velocity: 72.40 cm/s  Systemic Diam: 2.30 cm MV A velocity: 81.80 cm/s MV E/A ratio:  0.89 Kirk Ruths MD Electronically signed by Kirk Ruths MD Signature Date/Time: 10/17/2020/12:12:14 PM    Final    VAS [place] LOWER EXTREMITY VENOUS (DVT)  Result Date: 10/19/2020  Lower Venous DVT Study Indications: Pulmonary embolism.  Comparison Study: 04/19/20 previous Performing Technologist: Abram Sander RVS  Examination Guidelines: A complete evaluation includes B-mode imaging, spectral Doppler, color Doppler, and power Doppler as needed of all accessible portions of each vessel. Bilateral testing is considered an integral part of a complete examination. Limited examinations for reoccurring indications may be performed as noted. The reflux portion of the exam is performed with the patient in reverse Trendelenburg.  +---------+---------------+---------+-----------+----------+--------------+ RIGHT    CompressibilityPhasicitySpontaneityPropertiesThrombus Aging +---------+---------------+---------+-----------+----------+--------------+ CFV      Full           Yes      Yes                                 +---------+---------------+---------+-----------+----------+--------------+ SFJ      Full                                                        +---------+---------------+---------+-----------+----------+--------------+ FV Prox  Full                                                        +---------+---------------+---------+-----------+----------+--------------+ FV Mid   Full                                                        +---------+---------------+---------+-----------+----------+--------------+ FV DistalFull                                                         +---------+---------------+---------+-----------+----------+--------------+  PFV      Full                                                        +---------+---------------+---------+-----------+----------+--------------+ POP      Full           Yes      Yes                                 +---------+---------------+---------+-----------+----------+--------------+ PTV      Full                                                        +---------+---------------+---------+-----------+----------+--------------+ PERO     Full                                                        +---------+---------------+---------+-----------+----------+--------------+   +---------+---------------+---------+-----------+----------+--------------+ LEFT     CompressibilityPhasicitySpontaneityPropertiesThrombus Aging +---------+---------------+---------+-----------+----------+--------------+ CFV      Full           Yes      Yes                                 +---------+---------------+---------+-----------+----------+--------------+ SFJ      Full                                                        +---------+---------------+---------+-----------+----------+--------------+ FV Prox  Full                                                        +---------+---------------+---------+-----------+----------+--------------+ FV Mid   Full                                                        +---------+---------------+---------+-----------+----------+--------------+ FV DistalFull                                                        +---------+---------------+---------+-----------+----------+--------------+ PFV      Full                                                        +---------+---------------+---------+-----------+----------+--------------+  POP      Full           Yes      Yes                                  +---------+---------------+---------+-----------+----------+--------------+ PTV      Full                                                        +---------+---------------+---------+-----------+----------+--------------+ PERO     Full                                                        +---------+---------------+---------+-----------+----------+--------------+     Summary: BILATERAL: - No evidence of deep vein thrombosis seen in the lower extremities, bilaterally. - No evidence of superficial venous thrombosis in the lower extremities, bilaterally. -No evidence of popliteal cyst, bilaterally.   *See table(s) above for measurements and observations. Electronically signed by Curt Jews MD on 10/19/2020 at 7:18:23 PM.    Final      Time coordinating discharge: Over 42 minutes    Dwyane Dee, MD  [facility] 10/21/2020, 3:56 PM

## 2020-10-21 NOTE — Progress Notes (Addendum)
The patient has been seen in conjunction with Harlan Stains, NP. All aspects of care have been considered and discussed. The patient has been personally interviewed, examined, and all clinical data has been reviewed.   Underlying tendency to have hematuria even prior to anticoagulation and antiplatelet therapy.  With recurrent hematuria yesterday, decided to decrease P2 Y 12 intensity to clopidogrel 75 mg/day.  The patient was loaded with 300 mg of clopidogrel.  Continue PE dosing protocol for Eliquis.  Will be on long-term dual therapy with clopidogrel.  Agree with discharge today.  Hemoglobin stable - ish   Progress Note  Patient Name: Richard Webb Date of Encounter: 10/21/2020  [facility] Cardiologist: Rozann Lesches, MD   Subjective   No new issues overnight  No further epistaxis   Inpatient Medications    Scheduled Meds: . amLODipine  10 mg Oral Daily  . apixaban  10 mg Oral BID   Followed by  . [START ON 10/26/2020] apixaban  5 mg Oral BID  . atorvastatin  20 mg Oral q1800  . carbidopa-levodopa  1 tablet Oral QHS  . Chlorhexidine Gluconate Cloth  6 each Topical Daily  . clopidogrel  75 mg Oral Daily  . finasteride  5 mg Oral Daily  . fluticasone furoate-vilanterol  1 puff Inhalation Daily  . insulin aspart  0-5 Units Subcutaneous QHS  . insulin aspart  0-9 Units Subcutaneous TID WC  . isosorbide mononitrate  30 mg Oral Daily  . losartan  50 mg Oral Daily  . metoprolol tartrate  25 mg Oral BID  . montelukast  10 mg Oral QHS  . pantoprazole  40 mg Oral BID  . potassium chloride  40 mEq Oral Once  . sodium chloride flush  3 mL Intravenous Q12H   Continuous Infusions: . sodium chloride     PRN Meds: sodium chloride, diazepam, HYDROcodone-acetaminophen, loperamide, nitroGLYCERIN, ondansetron (ZOFRAN) IV, phenylephrine, sodium chloride flush, temazepam   Vital Signs    Vitals:   10/20/20 0749 10/20/20 0849 10/20/20 1958 10/21/20 0509  BP: (!)  152/62 (!) 161/72 134/66 126/64  Pulse: 83 80 78 77  Resp: 17  16 19   Temp: 97.8 F (36.6 C)  98.5 F (36.9 C) 98.5 F (36.9 C)  TempSrc: Oral  Oral Oral  SpO2: 95%  96% 100%  Weight:    100.4 kg  Height:        Intake/Output Summary (Last 24 hours) at 10/21/2020 0910 Last data filed at 10/21/2020 0526 Gross per 24 hour  Intake 310 ml  Output 1250 ml  Net -940 ml   Last 3 Weights 10/21/2020 10/20/2020 10/19/2020  Weight (lbs) 221 lb 6.4 oz 219 lb 9.6 oz 224 lb  Weight (kg) 100.426 kg 99.61 kg 101.606 kg      Telemetry    SR with PVCs - Personally Reviewed  ECG    No new tracing this morning  Physical Exam   GEN: No acute distress.   Neck: No JVD Cardiac: RRR, no murmurs, rubs, or gallops.  Respiratory: Clear to auscultation bilaterally. GI: Soft, nontender, non-distended  MS: No edema; No deformity. Right radial cath site stable Neuro:  Nonfocal  Psych: Normal affect   Labs    High Sensitivity Troponin:   Recent Labs  Lab 10/17/20 0236 10/17/20 0448 10/17/20 0729 10/17/20 0923 10/17/20 1138  TROPONINIHS 66* 137* 178* 207* 196*      Chemistry Recent Labs  Lab 10/16/20 2215 10/17/20 0448 10/18/20 0536 10/20/20 4132 10/21/20 0305  NA 133* 135 139 136 134*  K 4.0 3.8 3.6 3.7 3.4*  CL 99 104 104 106 103  CO2 21* 21* 24 20* 21*  GLUCOSE 306* 189* 113* 107* 135*  BUN 17 18 21 17 13   CREATININE 1.07 0.83 0.95 0.96 0.98  CALCIUM 9.0 9.1 9.1 8.5* 8.6*  PROT 7.8 7.6 6.6  --   --   ALBUMIN 4.0 3.9 3.6  --   --   AST 26 19 19   --   --   ALT 12 14 17   --   --   ALKPHOS 120 110 71  --   --   BILITOT 0.8 0.6 0.7  --   --   GFRNONAA >60 >60 >60 >60 >60  ANIONGAP 13 10 11 10 10      Hematology Recent Labs  Lab 10/19/20 0531 10/20/20 0218 10/21/20 0305  WBC 9.7 10.4 12.5*  RBC 4.60 4.07* 3.93*  HGB 12.8* 11.6* 11.0*  HCT 40.9 35.0* 33.5*  MCV 88.9 86.0 85.2  MCH 27.8 28.5 28.0  MCHC 31.3 33.1 32.8  RDW 15.8* 15.8* 15.7*  PLT 309 278 277    BNPNo  results for input(s): BNP, PROBNP in the last 168 hours.   DDimer No results for input(s): DDIMER in the last 168 hours.   Radiology    CARDIAC CATHETERIZATION  Result Date: 10/19/2020  Prox LAD lesion is 90% stenosed.  Dist LM lesion is 30% stenosed.  2nd Diag lesion is 70% stenosed.  Mid LAD lesion is 50% stenosed.  Dist LAD-1 lesion is 90% stenosed.  Dist LAD-2 lesion is 80% stenosed.  Post intervention, there is a 0% residual stenosis.  2nd Mrg lesion is 50% stenosed.  Mid Cx to Dist Cx lesion is 70% stenosed.  Previously placed Prox Cx to Mid Cx stent (unknown type) is widely patent.  Ost Cx to Prox Cx lesion is 20% stenosed.  Previously placed Ost RCA to Prox RCA stent (unknown type) is widely patent.  Previously placed Dist RCA stent (unknown type) is widely patent.  Prox RCA to Mid RCA lesion is 50% stenosed.  Mid RCA-1 lesion is 50% stenosed.  Previously placed Mid RCA-2 stent (unknown type) is widely patent.  RV Branch lesion is 95% stenosed.  RPDA lesion is 70% stenosed.  RPAV lesion is 50% stenosed.  A stent was successfully placed.  Significant three-vessel CAD with previously placed stents in the circumflex, and RCA which remain patent. The LAD has 30% smooth ostial narrowing followed by 90% proximal LAD stenosis before the initial septal perforating artery.  The mid LAD and diagonal vessel is previously noted 70 and 50% stenoses.  There is diffuse distal apical LAD disease of 90 and 80%. The circumflex stent is widely patent.  There is mild proximal 20% narrowing.  There is mid AV groove circumflex stenoses of 70% with 50% OM 2 stenosis. The RCA has patent stents at the ostium, proximal to the acute margin, and prior to the PDA vessel.  There is previously noted 50% stenoses slightly improved from previously in the mid RCA with old 95% ostial stenosis and a small marginal branch.  The PDA is small and has ostial 70% stenosis and there is 50% continuation branch stenosis in  the distal RCA. Successful PCI with DES stenting of the proximal LAD with ultimate insertion of a 4.0 x 15 mm Resolute Onyx stent postdilated with stent taper from 4.45 to 4.20 mm with residual narrowing 0% and brisk TIMI-3 flow. LVEDP 13  mm. RECOMMENDATION: DAPT therapy ideally for greater than 1 year.  The patient will have to defer his planned upcoming surgery and possibly this can be done after 6 months if he remains stable.  Recommend increase medical therapy for concomitant CAD with increase nitrates, beta-blocker therapy, consider initiation of ranolazine.  Continue aggressive lipid-lowering therapy with PCSK9 inhibition.   VAS [place] LOWER EXTREMITY VENOUS (DVT)  Result Date: 10/19/2020  Lower Venous DVT Study Indications: Pulmonary embolism.  Comparison Study: 04/19/20 previous Performing Technologist: Abram Sander RVS  Examination Guidelines: A complete evaluation includes B-mode imaging, spectral Doppler, color Doppler, and power Doppler as needed of all accessible portions of each vessel. Bilateral testing is considered an integral part of a complete examination. Limited examinations for reoccurring indications may be performed as noted. The reflux portion of the exam is performed with the patient in reverse Trendelenburg.  +---------+---------------+---------+-----------+----------+--------------+ RIGHT    CompressibilityPhasicitySpontaneityPropertiesThrombus Aging +---------+---------------+---------+-----------+----------+--------------+ CFV      Full           Yes      Yes                                 +---------+---------------+---------+-----------+----------+--------------+ SFJ      Full                                                        +---------+---------------+---------+-----------+----------+--------------+ FV Prox  Full                                                        +---------+---------------+---------+-----------+----------+--------------+ FV Mid    Full                                                        +---------+---------------+---------+-----------+----------+--------------+ FV DistalFull                                                        +---------+---------------+---------+-----------+----------+--------------+ PFV      Full                                                        +---------+---------------+---------+-----------+----------+--------------+ POP      Full           Yes      Yes                                 +---------+---------------+---------+-----------+----------+--------------+ PTV      Full                                                        +---------+---------------+---------+-----------+----------+--------------+  PERO     Full                                                        +---------+---------------+---------+-----------+----------+--------------+   +---------+---------------+---------+-----------+----------+--------------+ LEFT     CompressibilityPhasicitySpontaneityPropertiesThrombus Aging +---------+---------------+---------+-----------+----------+--------------+ CFV      Full           Yes      Yes                                 +---------+---------------+---------+-----------+----------+--------------+ SFJ      Full                                                        +---------+---------------+---------+-----------+----------+--------------+ FV Prox  Full                                                        +---------+---------------+---------+-----------+----------+--------------+ FV Mid   Full                                                        +---------+---------------+---------+-----------+----------+--------------+ FV DistalFull                                                        +---------+---------------+---------+-----------+----------+--------------+ PFV      Full                                                         +---------+---------------+---------+-----------+----------+--------------+ POP      Full           Yes      Yes                                 +---------+---------------+---------+-----------+----------+--------------+ PTV      Full                                                        +---------+---------------+---------+-----------+----------+--------------+ PERO     Full                                                        +---------+---------------+---------+-----------+----------+--------------+  Summary: BILATERAL: - No evidence of deep vein thrombosis seen in the lower extremities, bilaterally. - No evidence of superficial venous thrombosis in the lower extremities, bilaterally. -No evidence of popliteal cyst, bilaterally.   *See table(s) above for measurements and observations. Electronically signed by Curt Jews MD on 10/19/2020 at 7:18:23 PM.    Final     Cardiac Studies   Cath: 10/19/20   Prox LAD lesion is 90% stenosed.  Dist LM lesion is 30% stenosed.  2nd Diag lesion is 70% stenosed.  Mid LAD lesion is 50% stenosed.  Dist LAD-1 lesion is 90% stenosed.  Dist LAD-2 lesion is 80% stenosed.  Post intervention, there is a 0% residual stenosis.  2nd Mrg lesion is 50% stenosed.  Mid Cx to Dist Cx lesion is 70% stenosed.  Previously placed Prox Cx to Mid Cx stent (unknown type) is widely patent.  Ost Cx to Prox Cx lesion is 20% stenosed.  Previously placed Ost RCA to Prox RCA stent (unknown type) is widely patent.  Previously placed Dist RCA stent (unknown type) is widely patent.  Prox RCA to Mid RCA lesion is 50% stenosed.  Mid RCA-1 lesion is 50% stenosed.  Previously placed Mid RCA-2 stent (unknown type) is widely patent.  RV Branch lesion is 95% stenosed.  RPDA lesion is 70% stenosed.  RPAV lesion is 50% stenosed.  A stent was successfully placed.  Significant three-vessel CAD with previously placed stents in the  circumflex, and RCA which remain patent.  The LAD has 30% smooth ostial narrowing followed by 90% proximal LAD stenosis before the initial septal perforating artery. The mid LAD and diagonal vessel is previously noted 70 and 50% stenoses. There is diffuse distal apical LAD disease of 90 and 80%.  The circumflex stent is widely patent. There is mild proximal 20% narrowing. There is mid AV groove circumflex stenoses of 70% with 50% OM 2 stenosis.  The RCA has patent stents at the ostium, proximal to the acute margin, and prior to the PDA vessel. There is previously noted 50% stenoses slightly improved from previously in the mid RCA with old 95% ostial stenosis and a small marginal branch. The PDA is small and has ostial 70% stenosis and there is 50% continuation branch stenosis in the distal RCA.  Successful PCI with DES stenting of the proximal LAD with ultimate insertion of a 4.0 x 15 mm Resolute Onyx stent postdilated with stent taper from 4.45 to 4.20 mm with residual narrowing 0% and brisk TIMI-3 flow.  LVEDP 13 mm.  RECOMMENDATION: DAPT therapy ideally for greater than 1 year. The patient will have to defer his planned upcoming surgery and possibly this can be done after 6 months if he remains stable. Recommend increase medical therapy for concomitant CAD with increase nitrates, beta-blocker therapy, consider initiation of ranolazine. Continue aggressive lipid-lowering therapy with PCSK9 inhibition.  Diagnostic Dominance: Right    Intervention    Echo: 10/17/20  IMPRESSIONS    1. Left ventricular ejection fraction, by estimation, is 70 to 75%. The  left ventricle has hyperdynamic function. The left ventricle has no  regional wall motion abnormalities. There is mild left ventricular  hypertrophy of the basal-septal segment. Left  ventricular diastolic parameters are consistent with Grade I diastolic  dysfunction (impaired relaxation).  2. Right ventricular  systolic function is normal. The right ventricular  size is normal.  3. The mitral valve is normal in structure. Trivial mitral valve  regurgitation. No evidence of mitral stenosis.  4. The aortic  valve is tricuspid. Aortic valve regurgitation is not  visualized. Mild aortic valve sclerosis is present, with no evidence of  aortic valve stenosis.   Patient Profile     75 y.o. male with PMH of CADwho was seen for the evaluation ofchest painand h/o CAD (CAD DES x 3 RCA and DES Cfx 05/2010 [place], residual 50-70% dCfx in AV groove, 70% dLAD, 70% small D1, similar results on cath 2019, patent stents),primary hypertension, hyperlipidemia, statin intol, pulmonary hypertension, and sarcoidosis.   Assessment & Plan    1. NSTEMI/Chest pain: hsTn peaked at 207. Was placed on IV heparin and transferred to [facility] from [facility] for further work up with cardiac cath noted above with Dr. Claiborne Billings. Significant 3v CAD with patent stents in the Lcx and RCA. LAD with notable segmental disease of 30% follow by 90% pLAD stenosis and 90% apical lesion. Successful PCI/DES to the pLAD with TIMI 3 flow. Does have 95% stenosis in small marginal branch of the RCA.  -- placed on DAPT with ASA/Brilinta post cath with recommendations for aggressive medical therapy for residual disease -- of note the patient will also require treatment for PE per PCCM recommendations and currently on Eliquis 78m BID until 3/8, then reduced to 532mBID. Per Dr. SmTamala Julianill plan for triple therapy while in the hospital. Then stopping ASA at discharge. Then 30 days of Brilinta with transition from Brilinta to plavix. -- developed epistaxis/hematuria yesterday. ASA was stopped and ultimately switched to plavix with 30014moad. Now on 22m89mily  2. Small LLL PE: seen by PCCM with recommendations for OAC.Longfellow- on Eliquis 10mg34m load  3. HTN: stable -- continue metoprolol 25mg 15m losartan 25mg d37m, Imdur 30mg da43mand amlodipine 10mg dai14m  4. 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 Evaluated by PCCM with no recommendations for active treatment   6. Diverticulitis: followed by surgery. Had plans for a partial colectomy 3/11. Dr. Branch taHarl Bowieith Dr. Alicia ThLeighton Ruffok with postponing surgery given the need for cath and DAPT. This was relayed to the patient   7. Epistaxis: resolved  8. Hematuria: follows with urology as an outpatient. Symptoms have been unchanged. Says he was scheduled for a prostate procedure in the near future but this will be delayed now in the setting of ACS and need for DAPT -- H/H stable this morning -- continue routine follow up  9. Hypokalemia: 3.4, suppl  *Educated by PharmD prior to discharge   Will arrange hospital follow up  For questions or updates, please contact [facility] [email or web address] for contact info under    Signed, Lindsay RReino Bellis/2022, 9:10 AM

## 2020-10-21 NOTE — Discharge Instructions (Addendum)
Radial Site Care  This sheet gives you information about how to care for yourself after your procedure. Your health care provider may also give you more specific instructions. If you have problems or questions, contact your health care provider. What can I expect after the procedure? After the procedure, it is common to have:  Bruising and tenderness at the catheter insertion area. Follow these instructions at home: Medicines  Take over-the-counter and prescription medicines only as told by your health care provider. Insertion site care  Follow instructions from your health care provider about how to take care of your insertion site. Make sure you: ? Wash your hands with soap and water before you change your bandage (dressing). If soap and water are not available, use hand sanitizer. ? Change your dressing as told by your health care provider. ? Leave stitches (sutures), skin glue, or adhesive strips in place. These skin closures may need to stay in place for 2 weeks or longer. If adhesive strip edges start to loosen and curl up, you may trim the loose edges. Do not remove adhesive strips completely unless your health care provider tells you to do that.  Check your insertion site every day for signs of infection. Check for: ? Redness, swelling, or pain. ? Fluid or blood. ? Pus or a bad smell. ? Warmth.  Do not take baths, swim, or use a hot tub until your health care provider approves.  You may shower 24-48 hours after the procedure, or as directed by your health care provider. ? Remove the dressing and gently wash the site with plain soap and water. ? Pat the area dry with a clean towel. ? Do not rub the site. That could cause bleeding.  Do not apply powder or lotion to the site. Activity  For 24 hours after the procedure, or as directed by your health care provider: ? Do not flex or bend the affected arm. ? Do not push or pull heavy objects with the affected arm. ? Do not drive  yourself home from the hospital or clinic. You may drive 24 hours after the procedure unless your health care provider tells you not to. ? Do not operate machinery or power tools.  Do not lift anything that is heavier than 10 lb (4.5 kg), or the limit that you are told, until your health care provider says that it is safe.  Ask your health care provider when it is okay to: ? Return to work or school. ? Resume usual physical activities or sports. ? Resume sexual activity.   General instructions  If the catheter site starts to bleed, raise your arm and put firm pressure on the site. If the bleeding does not stop, get help right away. This is a medical emergency.  If you went home on the same day as your procedure, a responsible adult should be with you for the first 24 hours after you arrive home.  Keep all follow-up visits as told by your health care provider. This is important. Contact a health care provider if:  You have a fever.  You have redness, swelling, or yellow drainage around your insertion site. Get help right away if:  You have unusual pain at the radial site.  The catheter insertion area swells very fast.  The insertion area is bleeding, and the bleeding does not stop when you hold steady pressure on the area.  Your arm or hand becomes pale, cool, tingly, or numb. These symptoms may represent a serious  problem that is an emergency. Do not wait to see if the symptoms will go away. Get medical help right away. Call your local emergency services (911 in the [place] Do not drive yourself to the hospital. Summary  After the procedure, it is common to have bruising and tenderness at the site.  Follow instructions from your health care provider about how to take care of your radial site wound. Check the wound every day for signs of infection.  Do not lift anything that is heavier than 10 lb (4.5 kg), or the limit that you are told, until your health care provider says that it  is safe. This information is not intended to replace advice given to you by your health care provider. Make sure you discuss any questions you have with your health care provider. Document Revised: 09/12/2017 Document Reviewed: 09/12/2017 Elsevier Patient Education  2021 [place] on my medicine - ELIQUIS (apixaban)  This medication education was reviewed with me or my healthcare representative as part of my discharge preparation.  Why was Eliquis prescribed for you? 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  What do You need to know about Eliquis ? The starting dose is 10 mg (two 5 mg tablets) taken TWICE daily for the FIRST SEVEN (7) DAYS, then on Tuesday 3/8  the dose is reduced to ONE 5 mg tablet taken TWICE daily.  Eliquis may be taken with or without food.   Try to take the dose about the same time in the morning and in the evening. If you have difficulty swallowing the tablet whole please discuss with your pharmacist how to take the medication safely.  Take Eliquis exactly as prescribed and DO NOT stop taking Eliquis without talking to the doctor who prescribed the medication.  Stopping may increase your risk of developing a new blood clot.  Refill your prescription before you run out.  After discharge, you should have regular check-up appointments with your healthcare provider that is prescribing your Eliquis.    What do you do if you miss a dose? If a dose of ELIQUIS is not taken at the scheduled time, take it as soon as possible on the same day and twice-daily administration should be resumed. The dose should not be doubled to make up for a missed dose.  Important Safety Information A possible side effect of Eliquis is bleeding. You should call your healthcare provider right away if you experience  any of the following: ? Bleeding from an injury or your nose that does not stop. ? Unusual colored urine (red or dark brown) or unusual colored stools (red or black). ? Unusual bruising for unknown reasons. ? A serious fall or if you hit your head (even if there is no bleeding).  Some medicines may interact with Eliquis and might increase your risk of bleeding or clotting while on Eliquis. To help avoid this, consult your healthcare provider or pharmacist prior to using any new prescription or non-prescription medications, including herbals, vitamins, non-steroidal anti-inflammatory drugs (NSAIDs) and supplements.  This website has more information on Eliquis (apixaban): http://www.eliquis.com/eliquis/home  Information about your medication: Plavix (anti-platelet agent)  Generic Name (Brand): clopidogrel (Plavix), once daily medication  PURPOSE: You are taking this medication along with aspirin to lower your chance of having a heart attack, stroke, or blood clots in  your heart stent. These can be fatal. Plavix and aspirin help prevent platelets from sticking together and forming a clot that can block an artery or your stent.   Common SIDE EFFECTS you may experience include: bruising or bleeding more easily, shortness of breath  Do not stop taking PLAVIX without talking to the doctor who prescribes it for you. People who are treated with a stent and stop taking Plavix too soon, have a higher risk of getting a blood clot in the stent, having a heart attack, or dying. If you stop Plavix because of bleeding, or for other reasons, your risk of a heart attack or stroke may increase.   Tell all of your doctors and dentists that you are taking Plavix. They should talk to the doctor who prescribed Plavix for you before you have any surgery or invasive procedure.   Contact your health care provider if you experience: severe or uncontrollable bleeding, pink/red/brown urine, vomiting blood or vomit that  looks like "coffee grounds", red or black stools (looks like tar), coughing up blood or blood clots ----------------------------------------------------------------------------------------------------------------------    Heart-Healthy Eating Plan Heart-healthy meal planning includes:  Eating less unhealthy fats.  Eating more healthy fats.  Making other changes in your diet. Talk with your doctor or a diet specialist (dietitian) to create an eating plan that is right for you. What is my plan? Your doctor may recommend an eating plan that includes:  Total fat: ______% or less of total calories a day.  Saturated fat: ______% or less of total calories a day.  Cholesterol: less than _________mg a day. What are tips for following this plan? Cooking Avoid frying your food. Try to bake, boil, grill, or broil it instead. You can also reduce fat by:  Removing the skin from poultry.  Removing all visible fats from meats.  Steaming vegetables in water or broth. Meal planning  At meals, divide your plate into four equal parts: ? Fill one-half of your plate with vegetables and green salads. ? Fill one-fourth of your plate with whole grains. ? Fill one-fourth of your plate with lean protein foods.  Eat 4-5 servings of vegetables per day. A serving of vegetables is: ? 1 cup of raw or cooked vegetables. ? 2 cups of raw leafy greens.  Eat 4-5 servings of fruit per day. A serving of fruit is: ? 1 medium whole fruit. ?  cup of dried fruit. ?  cup of fresh, frozen, or canned fruit. ?  cup of 100% fruit juice.  Eat more foods that have soluble fiber. These are apples, broccoli, carrots, beans, peas, and barley. Try to get 20-30 g of fiber per day.  Eat 4-5 servings of nuts, legumes, and seeds per week: ? 1 serving of dried beans or legumes equals  cup after being cooked. ? 1 serving of nuts is  cup. ? 1 serving of seeds equals 1 tablespoon.   General information  Eat more  home-cooked food. Eat less restaurant, buffet, and fast food.  Limit or avoid alcohol.  Limit foods that are high in starch and sugar.  Avoid fried foods.  Lose weight if you are overweight.  Keep track of how much salt (sodium) you eat. This is important if you have high blood pressure. Ask your doctor to tell you more about this.  Try to add vegetarian meals each week. Fats  Choose healthy fats. These include olive oil and canola oil, flaxseeds, walnuts, almonds, and seeds.  Eat more omega-3 fats. These  include salmon, mackerel, sardines, tuna, flaxseed oil, and ground flaxseeds. Try to eat fish at least 2 times each week.  Check food labels. Avoid foods with trans fats or high amounts of saturated fat.  Limit saturated fats. ? These are often found in animal products, such as meats, butter, and cream. ? These are also found in plant foods, such as palm oil, palm kernel oil, and coconut oil.  Avoid foods with partially hydrogenated oils in them. These have trans fats. Examples are stick margarine, some tub margarines, cookies, crackers, and other baked goods. What foods can I eat? Fruits All fresh, canned (in natural juice), or frozen fruits. Vegetables Fresh or frozen vegetables (raw, steamed, roasted, or grilled). Green salads. Grains Most grains. Choose whole wheat and whole grains most of the time. Rice and pasta, including brown rice and pastas made with whole wheat. Meats and other proteins Lean, well-trimmed beef, veal, pork, and lamb. Chicken and [place] without skin. All fish and shellfish. Wild duck, rabbit, pheasant, and venison. Egg whites or low-cholesterol egg substitutes. Dried beans, peas, lentils, and tofu. Seeds and most nuts. Dairy Low-fat or nonfat cheeses, including ricotta and mozzarella. Skim or 1% milk that is liquid, powdered, or evaporated. Buttermilk that is made with low-fat milk. Nonfat or low-fat yogurt. Fats and oils Non-hydrogenated (trans-free)  margarines. Vegetable oils, including soybean, sesame, sunflower, olive, peanut, safflower, corn, canola, and cottonseed. Salad dressings or mayonnaise made with a vegetable oil. Beverages Mineral water. Coffee and tea. Diet carbonated beverages. Sweets and desserts Sherbet, gelatin, and fruit ice. Small amounts of dark chocolate. Limit all sweets and desserts. Seasonings and condiments All seasonings and condiments. The items listed above may not be a complete list of foods and drinks you can eat. Contact a dietitian for more options. What foods should I avoid? Fruits Canned fruit in heavy syrup. Fruit in cream or butter sauce. Fried fruit. Limit coconut. Vegetables Vegetables cooked in cheese, cream, or butter sauce. Fried vegetables. Grains Breads that are made with saturated or trans fats, oils, or whole milk. Croissants. Sweet rolls. Donuts. High-fat crackers, such as cheese crackers. Meats and other proteins Fatty meats, such as hot dogs, ribs, sausage, bacon, rib-eye roast or steak. High-fat deli meats, such as salami and bologna. Caviar. Domestic duck and goose. Organ meats, such as liver. Dairy Cream, sour cream, cream cheese, and creamed cottage cheese. Whole-milk cheeses. Whole or 2% milk that is liquid, evaporated, or condensed. Whole buttermilk. Cream sauce or high-fat cheese sauce. Yogurt that is made from whole milk. Fats and oils Meat fat, or shortening. Cocoa butter, hydrogenated oils, palm oil, coconut oil, palm kernel oil. Solid fats and shortenings, including bacon fat, salt pork, lard, and butter. Nondairy cream substitutes. Salad dressings with cheese or sour cream. Beverages Regular sodas and juice drinks with added sugar. Sweets and desserts Frosting. Pudding. Cookies. Cakes. Pies. Milk chocolate or white chocolate. Buttered syrups. Full-fat ice cream or ice cream drinks. The items listed above may not be a complete list of foods and drinks to avoid. Contact a  dietitian for more information. Summary  Heart-healthy meal planning includes eating less unhealthy fats, eating more healthy fats, and making other changes in your diet.  Eat a balanced diet. This includes fruits and vegetables, low-fat or nonfat dairy, lean protein, nuts and legumes, whole grains, and heart-healthy oils and fats. This information is not intended to replace advice given to you by your health care provider. Make sure you discuss any questions you have  with your health care provider. Document Revised: 10/11/2017 Document Reviewed: 09/14/2017 Elsevier Patient Education  2021 [employer].

## 2020-10-22 ENCOUNTER — Telehealth (HOSPITAL_COMMUNITY): Payer: Self-pay

## 2020-10-22 NOTE — Telephone Encounter (Signed)
Per Phase I, fax cardiac rehab referral to [facility] cardiac rehab.

## 2020-10-26 ENCOUNTER — Other Ambulatory Visit (HOSPITAL_COMMUNITY): Payer: Medicare Other

## 2020-10-27 ENCOUNTER — Other Ambulatory Visit: Payer: Medicare Other | Admitting: Urology

## 2020-10-29 ENCOUNTER — Encounter (HOSPITAL_COMMUNITY): Admission: RE | Payer: Self-pay | Source: Home / Self Care

## 2020-10-29 ENCOUNTER — Inpatient Hospital Stay (HOSPITAL_COMMUNITY): Admission: RE | Admit: 2020-10-29 | Payer: Medicare Other | Source: Home / Self Care | Admitting: General Surgery

## 2020-10-29 DIAGNOSIS — Z86711 Personal history of pulmonary embolism: Secondary | ICD-10-CM | POA: Diagnosis not present

## 2020-10-29 DIAGNOSIS — L4050 Arthropathic psoriasis, unspecified: Secondary | ICD-10-CM | POA: Diagnosis not present

## 2020-10-29 DIAGNOSIS — Z789 Other specified health status: Secondary | ICD-10-CM | POA: Diagnosis not present

## 2020-10-29 DIAGNOSIS — E538 Deficiency of other specified B group vitamins: Secondary | ICD-10-CM | POA: Diagnosis not present

## 2020-10-29 DIAGNOSIS — Z6829 Body mass index (BMI) 29.0-29.9, adult: Secondary | ICD-10-CM | POA: Diagnosis not present

## 2020-10-29 DIAGNOSIS — I10 Essential (primary) hypertension: Secondary | ICD-10-CM | POA: Diagnosis not present

## 2020-10-29 DIAGNOSIS — K7460 Unspecified cirrhosis of liver: Secondary | ICD-10-CM | POA: Diagnosis not present

## 2020-10-29 DIAGNOSIS — Z299 Encounter for prophylactic measures, unspecified: Secondary | ICD-10-CM | POA: Diagnosis not present

## 2020-10-29 DIAGNOSIS — I214 Non-ST elevation (NSTEMI) myocardial infarction: Secondary | ICD-10-CM | POA: Diagnosis not present

## 2020-10-29 SURGERY — COLECTOMY, PARTIAL, ROBOT-ASSISTED, LAPAROSCOPIC
Anesthesia: General

## 2020-11-01 ENCOUNTER — Other Ambulatory Visit: Payer: Self-pay

## 2020-11-01 ENCOUNTER — Inpatient Hospital Stay (HOSPITAL_COMMUNITY)
Admission: EM | Admit: 2020-11-01 | Discharge: 2020-11-05 | DRG: 391 | Disposition: A | Discharge status: Home / Self Care | Payer: Medicare Other | Attending: Internal Medicine | Admitting: Internal Medicine

## 2020-11-01 ENCOUNTER — Encounter (HOSPITAL_COMMUNITY): Payer: Self-pay | Admitting: Emergency Medicine

## 2020-11-01 DIAGNOSIS — Z888 Allergy status to other drugs, medicaments and biological substances status: Secondary | ICD-10-CM

## 2020-11-01 DIAGNOSIS — K5720 Diverticulitis of large intestine with perforation and abscess without bleeding: Principal | ICD-10-CM | POA: Diagnosis present

## 2020-11-01 DIAGNOSIS — N400 Enlarged prostate without lower urinary tract symptoms: Secondary | ICD-10-CM | POA: Diagnosis present

## 2020-11-01 DIAGNOSIS — I214 Non-ST elevation (NSTEMI) myocardial infarction: Secondary | ICD-10-CM | POA: Diagnosis present

## 2020-11-01 DIAGNOSIS — Z96653 Presence of artificial knee joint, bilateral: Secondary | ICD-10-CM | POA: Diagnosis present

## 2020-11-01 DIAGNOSIS — K651 Peritoneal abscess: Secondary | ICD-10-CM | POA: Diagnosis present

## 2020-11-01 DIAGNOSIS — R52 Pain, unspecified: Secondary | ICD-10-CM

## 2020-11-01 DIAGNOSIS — Z7901 Long term (current) use of anticoagulants: Secondary | ICD-10-CM

## 2020-11-01 DIAGNOSIS — Z20822 Contact with and (suspected) exposure to covid-19: Secondary | ICD-10-CM | POA: Diagnosis present

## 2020-11-01 DIAGNOSIS — K219 Gastro-esophageal reflux disease without esophagitis: Secondary | ICD-10-CM | POA: Diagnosis not present

## 2020-11-01 DIAGNOSIS — Z955 Presence of coronary angioplasty implant and graft: Secondary | ICD-10-CM

## 2020-11-01 DIAGNOSIS — K5190 Ulcerative colitis, unspecified, without complications: Secondary | ICD-10-CM | POA: Diagnosis present

## 2020-11-01 DIAGNOSIS — K5150 Left sided colitis without complications: Secondary | ICD-10-CM | POA: Diagnosis not present

## 2020-11-01 DIAGNOSIS — R3989 Other symptoms and signs involving the genitourinary system: Secondary | ICD-10-CM | POA: Diagnosis not present

## 2020-11-01 DIAGNOSIS — Z91030 Bee allergy status: Secondary | ICD-10-CM

## 2020-11-01 DIAGNOSIS — G4733 Obstructive sleep apnea (adult) (pediatric): Secondary | ICD-10-CM | POA: Diagnosis present

## 2020-11-01 DIAGNOSIS — Z9049 Acquired absence of other specified parts of digestive tract: Secondary | ICD-10-CM

## 2020-11-01 DIAGNOSIS — I2699 Other pulmonary embolism without acute cor pulmonale: Secondary | ICD-10-CM | POA: Diagnosis present

## 2020-11-01 DIAGNOSIS — E782 Mixed hyperlipidemia: Secondary | ICD-10-CM | POA: Diagnosis present

## 2020-11-01 DIAGNOSIS — E871 Hypo-osmolality and hyponatremia: Secondary | ICD-10-CM | POA: Diagnosis present

## 2020-11-01 DIAGNOSIS — Z800 Family history of malignant neoplasm of digestive organs: Secondary | ICD-10-CM

## 2020-11-01 DIAGNOSIS — D869 Sarcoidosis, unspecified: Secondary | ICD-10-CM | POA: Diagnosis present

## 2020-11-01 DIAGNOSIS — R109 Unspecified abdominal pain: Secondary | ICD-10-CM | POA: Diagnosis not present

## 2020-11-01 DIAGNOSIS — Z79899 Other long term (current) drug therapy: Secondary | ICD-10-CM

## 2020-11-01 DIAGNOSIS — Z86711 Personal history of pulmonary embolism: Secondary | ICD-10-CM

## 2020-11-01 DIAGNOSIS — R1032 Left lower quadrant pain: Secondary | ICD-10-CM | POA: Diagnosis not present

## 2020-11-01 DIAGNOSIS — I10 Essential (primary) hypertension: Secondary | ICD-10-CM | POA: Diagnosis not present

## 2020-11-01 DIAGNOSIS — I2510 Atherosclerotic heart disease of native coronary artery without angina pectoris: Secondary | ICD-10-CM | POA: Diagnosis present

## 2020-11-01 DIAGNOSIS — G2581 Restless legs syndrome: Secondary | ICD-10-CM | POA: Diagnosis present

## 2020-11-01 DIAGNOSIS — E876 Hypokalemia: Secondary | ICD-10-CM | POA: Diagnosis not present

## 2020-11-01 DIAGNOSIS — Z87442 Personal history of urinary calculi: Secondary | ICD-10-CM

## 2020-11-01 DIAGNOSIS — K5732 Diverticulitis of large intestine without perforation or abscess without bleeding: Secondary | ICD-10-CM | POA: Diagnosis not present

## 2020-11-01 DIAGNOSIS — Z8249 Family history of ischemic heart disease and other diseases of the circulatory system: Secondary | ICD-10-CM

## 2020-11-01 DIAGNOSIS — Z7902 Long term (current) use of antithrombotics/antiplatelets: Secondary | ICD-10-CM

## 2020-11-01 DIAGNOSIS — D649 Anemia, unspecified: Secondary | ICD-10-CM | POA: Diagnosis present

## 2020-11-01 HISTORY — DX: Diverticulitis of intestine, part unspecified, without perforation or abscess without bleeding: K57.92

## 2020-11-01 LAB — COMPREHENSIVE METABOLIC PANEL
ALT: 5 U/L (ref 0–44)
AST: 17 U/L (ref 15–41)
Albumin: 3.6 g/dL (ref 3.5–5.0)
Alkaline Phosphatase: 73 U/L (ref 38–126)
Anion gap: 10 (ref 5–15)
BUN: 16 mg/dL (ref 8–23)
CO2: 24 mmol/L (ref 22–32)
Calcium: 9.4 mg/dL (ref 8.9–10.3)
Chloride: 95 mmol/L — ABNORMAL LOW (ref 98–111)
Creatinine, Ser: 1.08 mg/dL (ref 0.61–1.24)
GFR, Estimated: >60 mL/min (ref >60)
Glucose, Bld: 113 mg/dL — ABNORMAL HIGH (ref 70–99)
Potassium: 3.9 mmol/L (ref 3.5–5.1)
Sodium: 129 mmol/L — ABNORMAL LOW (ref 135–145)
Total Bilirubin: 1.3 mg/dL — ABNORMAL HIGH (ref 0.3–1.2)
Total Protein: 7.5 g/dL (ref 6.5–8.1)

## 2020-11-01 LAB — CBC WITH DIFFERENTIAL/PLATELET
Abs Immature Granulocytes: 0.08 10*3/uL — ABNORMAL HIGH (ref 0.00–0.07)
Basophils Absolute: 0.1 10*3/uL (ref 0.0–0.1)
Basophils Relative: 0 %
Eosinophils Absolute: 0.2 10*3/uL (ref 0.0–0.5)
Eosinophils Relative: 2 %
HCT: 36 % — ABNORMAL LOW (ref 39.0–52.0)
Hemoglobin: 11.6 g/dL — ABNORMAL LOW (ref 13.0–17.0)
Immature Granulocytes: 1 %
Lymphocytes Relative: 12 %
Lymphs Abs: 1.7 10*3/uL (ref 0.7–4.0)
MCH: 27.7 pg (ref 26.0–34.0)
MCHC: 32.2 g/dL (ref 30.0–36.0)
MCV: 85.9 fL (ref 80.0–100.0)
Monocytes Absolute: 1.1 10*3/uL — ABNORMAL HIGH (ref 0.1–1.0)
Monocytes Relative: 7 %
Neutro Abs: 11.5 10*3/uL — ABNORMAL HIGH (ref 1.7–7.7)
Neutrophils Relative %: 78 %
Platelets: 376 10*3/uL (ref 150–400)
RBC: 4.19 MIL/uL — ABNORMAL LOW (ref 4.22–5.81)
RDW: 15 % (ref 11.5–15.5)
WBC: 14.6 10*3/uL — ABNORMAL HIGH (ref 4.0–10.5)
nRBC: 0 % (ref 0.0–0.2)

## 2020-11-01 LAB — LIPASE, BLOOD: Lipase: 36 U/L (ref 11–51)

## 2020-11-01 NOTE — ED Triage Notes (Signed)
Patient advised by his gastroenterologist to go to ER for IV antibiotics due to abnormal CT scan result today at [facility] - diverticulitis with abscess. Denies fever or chills .

## 2020-11-02 ENCOUNTER — Inpatient Hospital Stay: Payer: Self-pay

## 2020-11-02 ENCOUNTER — Encounter (HOSPITAL_COMMUNITY): Payer: Self-pay | Admitting: Internal Medicine

## 2020-11-02 DIAGNOSIS — I2699 Other pulmonary embolism without acute cor pulmonale: Secondary | ICD-10-CM | POA: Diagnosis not present

## 2020-11-02 DIAGNOSIS — I25118 Atherosclerotic heart disease of native coronary artery with other forms of angina pectoris: Secondary | ICD-10-CM | POA: Diagnosis not present

## 2020-11-02 DIAGNOSIS — I2510 Atherosclerotic heart disease of native coronary artery without angina pectoris: Secondary | ICD-10-CM

## 2020-11-02 DIAGNOSIS — D869 Sarcoidosis, unspecified: Secondary | ICD-10-CM | POA: Diagnosis present

## 2020-11-02 DIAGNOSIS — E782 Mixed hyperlipidemia: Secondary | ICD-10-CM | POA: Diagnosis present

## 2020-11-02 DIAGNOSIS — Z7901 Long term (current) use of anticoagulants: Secondary | ICD-10-CM | POA: Diagnosis not present

## 2020-11-02 DIAGNOSIS — Z79899 Other long term (current) drug therapy: Secondary | ICD-10-CM | POA: Diagnosis not present

## 2020-11-02 DIAGNOSIS — I252 Old myocardial infarction: Secondary | ICD-10-CM | POA: Diagnosis not present

## 2020-11-02 DIAGNOSIS — K219 Gastro-esophageal reflux disease without esophagitis: Secondary | ICD-10-CM | POA: Diagnosis present

## 2020-11-02 DIAGNOSIS — Z86711 Personal history of pulmonary embolism: Secondary | ICD-10-CM | POA: Diagnosis not present

## 2020-11-02 DIAGNOSIS — K5190 Ulcerative colitis, unspecified, without complications: Secondary | ICD-10-CM | POA: Diagnosis present

## 2020-11-02 DIAGNOSIS — N400 Enlarged prostate without lower urinary tract symptoms: Secondary | ICD-10-CM | POA: Diagnosis present

## 2020-11-02 DIAGNOSIS — Z888 Allergy status to other drugs, medicaments and biological substances status: Secondary | ICD-10-CM | POA: Diagnosis not present

## 2020-11-02 DIAGNOSIS — K5720 Diverticulitis of large intestine with perforation and abscess without bleeding: Secondary | ICD-10-CM | POA: Diagnosis present

## 2020-11-02 DIAGNOSIS — E871 Hypo-osmolality and hyponatremia: Secondary | ICD-10-CM | POA: Diagnosis present

## 2020-11-02 DIAGNOSIS — Z9049 Acquired absence of other specified parts of digestive tract: Secondary | ICD-10-CM | POA: Diagnosis not present

## 2020-11-02 DIAGNOSIS — I200 Unstable angina: Secondary | ICD-10-CM | POA: Diagnosis not present

## 2020-11-02 DIAGNOSIS — Z955 Presence of coronary angioplasty implant and graft: Secondary | ICD-10-CM | POA: Diagnosis not present

## 2020-11-02 DIAGNOSIS — Z9861 Coronary angioplasty status: Secondary | ICD-10-CM

## 2020-11-02 DIAGNOSIS — G2581 Restless legs syndrome: Secondary | ICD-10-CM | POA: Diagnosis present

## 2020-11-02 DIAGNOSIS — Z7902 Long term (current) use of antithrombotics/antiplatelets: Secondary | ICD-10-CM | POA: Diagnosis not present

## 2020-11-02 DIAGNOSIS — I10 Essential (primary) hypertension: Secondary | ICD-10-CM | POA: Diagnosis present

## 2020-11-02 DIAGNOSIS — Z91030 Bee allergy status: Secondary | ICD-10-CM | POA: Diagnosis not present

## 2020-11-02 DIAGNOSIS — G4733 Obstructive sleep apnea (adult) (pediatric): Secondary | ICD-10-CM | POA: Diagnosis present

## 2020-11-02 DIAGNOSIS — Z87442 Personal history of urinary calculi: Secondary | ICD-10-CM | POA: Diagnosis not present

## 2020-11-02 DIAGNOSIS — I214 Non-ST elevation (NSTEMI) myocardial infarction: Secondary | ICD-10-CM | POA: Diagnosis present

## 2020-11-02 DIAGNOSIS — Z8249 Family history of ischemic heart disease and other diseases of the circulatory system: Secondary | ICD-10-CM | POA: Diagnosis not present

## 2020-11-02 DIAGNOSIS — K651 Peritoneal abscess: Secondary | ICD-10-CM | POA: Diagnosis present

## 2020-11-02 DIAGNOSIS — Z800 Family history of malignant neoplasm of digestive organs: Secondary | ICD-10-CM | POA: Diagnosis not present

## 2020-11-02 DIAGNOSIS — Z20822 Contact with and (suspected) exposure to covid-19: Secondary | ICD-10-CM | POA: Diagnosis present

## 2020-11-02 LAB — URINALYSIS, ROUTINE W REFLEX MICROSCOPIC
Bilirubin Urine: NEGATIVE
Bilirubin Urine: NEGATIVE
Glucose, UA: NEGATIVE mg/dL
Glucose, UA: NEGATIVE mg/dL
Hgb urine dipstick: NEGATIVE
Hgb urine dipstick: NEGATIVE
Ketones, ur: NEGATIVE mg/dL
Ketones, ur: NEGATIVE mg/dL
Leukocytes,Ua: NEGATIVE
Leukocytes,Ua: NEGATIVE
Nitrite: NEGATIVE
Nitrite: NEGATIVE
Protein, ur: NEGATIVE mg/dL
Protein, ur: NEGATIVE mg/dL
Specific Gravity, Urine: 1.041 — ABNORMAL HIGH (ref 1.005–1.030)
Specific Gravity, Urine: 1.012 (ref 1.005–1.030)
pH: 5 (ref 5.0–8.0)
pH: 6 (ref 5.0–8.0)

## 2020-11-02 LAB — GLUCOSE, CAPILLARY
Glucose-Capillary: 104 mg/dL — ABNORMAL HIGH (ref 70–99)
Glucose-Capillary: 99 mg/dL (ref 70–99)
Glucose-Capillary: 166 mg/dL — ABNORMAL HIGH (ref 70–99)

## 2020-11-02 LAB — PROCALCITONIN: Procalcitonin: <0.1 ng/mL

## 2020-11-02 LAB — HEPARIN LEVEL (UNFRACTIONATED): Heparin Unfractionated: 0.84 IU/mL — ABNORMAL HIGH (ref 0.30–0.70)

## 2020-11-02 LAB — APTT
aPTT: 56 seconds — ABNORMAL HIGH (ref 24–36)
aPTT: 68 seconds — ABNORMAL HIGH (ref 24–36)

## 2020-11-02 LAB — OSMOLALITY, URINE: Osmolality, Ur: 284 mOsm/kg — ABNORMAL LOW (ref 300–900)

## 2020-11-02 LAB — RESP PANEL BY RT-PCR (FLU A&B, COVID) ARPGX2
Influenza A by PCR: NEGATIVE
Influenza B by PCR: NEGATIVE
SARS Coronavirus 2 by RT PCR: NEGATIVE

## 2020-11-02 LAB — OSMOLALITY: Osmolality: 280 mOsm/kg (ref 275–295)

## 2020-11-02 LAB — TYPE AND SCREEN
ABO/RH(D): O NEG
Antibody Screen: NEGATIVE

## 2020-11-02 LAB — SODIUM, URINE, RANDOM: Sodium, Ur: 14 mmol/L

## 2020-11-02 LAB — ABO/RH: ABO/RH(D): O NEG

## 2020-11-02 MED ORDER — CARBIDOPA-LEVODOPA 10-100 MG PO TABS
1.0000 | ORAL_TABLET | Freq: Every evening | ORAL | Status: DC
  Administered 2020-11-02 – 2020-11-04 (×3): 1 via ORAL
  Filled 2020-11-02 (×3): qty 1

## 2020-11-02 MED ORDER — MORPHINE SULFATE (PF) 2 MG/ML IV SOLN
1.0000 mg | INTRAVENOUS | Status: DC | PRN
  Administered 2020-11-02 (×2): 1 mg via INTRAVENOUS
  Filled 2020-11-02 (×3): qty 1

## 2020-11-02 MED ORDER — PIPERACILLIN-TAZOBACTAM 3.375 G IVPB 30 MIN
3.3750 g | Freq: Once | INTRAVENOUS | Status: AC
  Administered 2020-11-02: 3.375 g via INTRAVENOUS
  Filled 2020-11-02: qty 50

## 2020-11-02 MED ORDER — FLUTICASONE FUROATE-VILANTEROL 200-25 MCG/INH IN AEPB
1.0000 | INHALATION_SPRAY | Freq: Every day | RESPIRATORY_TRACT | Status: DC
  Administered 2020-11-02 – 2020-11-05 (×4): 1 via RESPIRATORY_TRACT
  Filled 2020-11-02: qty 28

## 2020-11-02 MED ORDER — ONDANSETRON HCL 4 MG/2ML IJ SOLN
4.0000 mg | Freq: Once | INTRAMUSCULAR | Status: AC
  Administered 2020-11-02: 4 mg via INTRAVENOUS
  Filled 2020-11-02: qty 2

## 2020-11-02 MED ORDER — CLOPIDOGREL BISULFATE 75 MG PO TABS
75.0000 mg | ORAL_TABLET | Freq: Every day | ORAL | Status: DC
  Administered 2020-11-02 – 2020-11-05 (×4): 75 mg via ORAL
  Filled 2020-11-02 (×4): qty 1

## 2020-11-02 MED ORDER — EZETIMIBE 10 MG PO TABS: 10.0000 mg | ORAL_TABLET | Freq: Every day | ORAL | Status: DC

## 2020-11-02 MED ORDER — LOSARTAN POTASSIUM 50 MG PO TABS: 50.0000 mg | ORAL_TABLET | Freq: Every day | ORAL | Status: DC

## 2020-11-02 MED ORDER — ISOSORBIDE MONONITRATE ER 30 MG PO TB24
30.0000 mg | ORAL_TABLET | Freq: Every day | ORAL | Status: DC
Start: 2020-11-02 — End: 2020-11-05
  Administered 2020-11-02 – 2020-11-05 (×4): 30 mg via ORAL
  Filled 2020-11-02 (×4): qty 1

## 2020-11-02 MED ORDER — TEMAZEPAM 15 MG PO CAPS
30.0000 mg | ORAL_CAPSULE | Freq: Every evening | ORAL | Status: DC | PRN
  Administered 2020-11-02 – 2020-11-04 (×3): 30 mg via ORAL
  Filled 2020-11-02 (×3): qty 2

## 2020-11-02 MED ORDER — METOPROLOL TARTRATE 25 MG PO TABS
25.0000 mg | ORAL_TABLET | Freq: Two times a day (BID) | ORAL | Status: DC
  Administered 2020-11-02 – 2020-11-05 (×6): 25 mg via ORAL
  Filled 2020-11-02 (×6): qty 1

## 2020-11-02 MED ORDER — CYANOCOBALAMIN 1000 MCG/ML IJ SOLN: 1000.0000 ug | INTRAMUSCULAR | Status: DC

## 2020-11-02 MED ORDER — HYDRALAZINE HCL 20 MG/ML IJ SOLN: 10.0000 mg | Freq: Four times a day (QID) | INTRAMUSCULAR | Status: DC | PRN

## 2020-11-02 MED ORDER — HEPARIN (PORCINE) 25000 UT/250ML-% IV SOLN
1550.0000 [IU]/h | INTRAVENOUS | Status: AC
  Administered 2020-11-02: 1400 [IU]/h via INTRAVENOUS
  Administered 2020-11-03: 1550 [IU]/h via INTRAVENOUS
  Filled 2020-11-02 (×4): qty 250

## 2020-11-02 MED ORDER — FOLIC ACID 1 MG PO TABS
1.0000 mg | ORAL_TABLET | ORAL | Status: DC
  Administered 2020-11-02 – 2020-11-04 (×3): 1 mg via ORAL
  Filled 2020-11-02 (×2): qty 1

## 2020-11-02 MED ORDER — SODIUM CHLORIDE 0.9 % IV SOLN: Freq: Once | INTRAVENOUS | Status: AC

## 2020-11-02 MED ORDER — FAMOTIDINE 20 MG PO TABS
40.0000 mg | ORAL_TABLET | Freq: Every day | ORAL | Status: DC
  Administered 2020-11-02 – 2020-11-05 (×4): 40 mg via ORAL
  Filled 2020-11-02 (×4): qty 2

## 2020-11-02 MED ORDER — PIPERACILLIN-TAZOBACTAM 3.375 G IVPB
3.3750 g | Freq: Three times a day (TID) | INTRAVENOUS | Status: DC
  Administered 2020-11-02 – 2020-11-04 (×6): 3.375 g via INTRAVENOUS
  Filled 2020-11-02 (×6): qty 50

## 2020-11-02 MED ORDER — AMLODIPINE BESYLATE 10 MG PO TABS
10.0000 mg | ORAL_TABLET | Freq: Every day | ORAL | Status: DC
Start: 2020-11-02 — End: 2020-11-05
  Administered 2020-11-02 – 2020-11-05 (×4): 10 mg via ORAL
  Filled 2020-11-02 (×4): qty 1

## 2020-11-02 MED ORDER — SODIUM CHLORIDE 0.9 % IV SOLN: INTRAVENOUS | Status: AC

## 2020-11-02 MED ORDER — LORAZEPAM 0.5 MG PO TABS: 0.5000 mg | ORAL_TABLET | Freq: Every day | ORAL | Status: DC | PRN

## 2020-11-02 MED ORDER — NITROGLYCERIN 0.4 MG SL SUBL: 0.4000 mg | SUBLINGUAL_TABLET | SUBLINGUAL | Status: DC | PRN

## 2020-11-02 MED ORDER — FINASTERIDE 5 MG PO TABS
5.0000 mg | ORAL_TABLET | Freq: Every day | ORAL | Status: DC
Start: 2020-11-02 — End: 2020-11-05
  Administered 2020-11-02 – 2020-11-05 (×4): 5 mg via ORAL
  Filled 2020-11-02 (×5): qty 1

## 2020-11-02 MED ORDER — DEXTROSE-NACL 5-0.9 % IV SOLN: INTRAVENOUS | Status: DC

## 2020-11-02 MED ORDER — MORPHINE SULFATE (PF) 4 MG/ML IV SOLN
4.0000 mg | Freq: Once | INTRAVENOUS | Status: AC
  Administered 2020-11-02: 4 mg via INTRAVENOUS
  Filled 2020-11-02: qty 1

## 2020-11-02 NOTE — ED Provider Notes (Signed)
[facility] EMERGENCY DEPARTMENT Provider Note   CSN: [id number] Arrival date & time: 11/01/20  2058     History Chief Complaint  Patient presents with  . Diverticulitis w/Abscess    Richard Webb is a 75 y.o. male.  HPI     This is a 75 year old male with a history of coronary artery disease, pulmonary hypertension, diverticulitis, sarcoidosis who presents with reports of diverticulitis with abscess.  Patient states that he had a heart attack several weeks ago.  Prior to that he had been scheduled for a colectomy for recurrent diverticular disease and abscess.  That now has been delayed because of anticoagulation.  He states that he began to have lower abdominal discomfort consistent with his prior diverticulitis.  He was treated as an outpatient with Augmentin by his GI doctor in [place].  He states that his pain continued.  He did not have any fevers.  He was seen and evaluated yesterday by his GI doctor who got a CT scan through [facility] in [place].  He was told that he had a 4 cm abscess at his sigmoid colon and needed IV antibiotics.  Patient reports ongoing pain.  No nausea, vomiting, diarrhea.  No bleeding.  Rates his pain at 7 out of 10.  He is currently on Eliquis and Plavix.  History is provided primarily by the patient.  I have been unable to obtain medical records from [facility].  I did speak to radiology who cannot see imaging from earlier yesterday.  Recommend trying to obtain a CD versus reimaging after 24 hours.  Past Medical History:  Diagnosis Date  . Arthritis   . Asthma   . Coronary atherosclerosis of native coronary artery    a. DES x 3 RCA and DES mCx 10/11 - [place] - with residual 50-70% stenosis in the distal Cx in AV groove, 70% distal LAD beyond apex, 70% small D1. b. 12/2017: similar results with multivessel CAD. Patent stents. Medical management recommended.   . Diverticulitis   . GERD (gastroesophageal  reflux disease)   . History of kidney stones   . Mixed hyperlipidemia   . Obstructive sleep apnea   . Pulmonary hypertension (HCC)    PASP 50 mmHg  . Restless leg syndrome   . Sarcoidosis   . Statin intolerance   . Ulcerative colitis [facility])     Patient Active Problem List   Diagnosis Date Noted  . Pulmonary embolism ([place]) 10/18/2020  . Essential hypertension 10/17/2020  . History of ulcerative colitis 10/17/2020  . GERD (gastroesophageal reflux disease) 10/17/2020  . Asthma 10/17/2020  . Sarcoidosis 10/17/2020  . History of diverticulitis 10/17/2020  . Hyperglycemia 10/17/2020  . NSTEMI (non-ST elevated myocardial infarction) ([place]) 10/17/2020  . Diverticulitis of large intestine with abscess 04/17/2020  . Acute diverticulitis 04/17/2020  . BPH (benign prostatic hyperplasia) 03/18/2020  . Gross hematuria 03/18/2020  . OAB (overactive bladder) 03/18/2020  . Dyspnea on exertion 07/21/2019  . Accelerating angina ([place])   . Coronary artery disease 10/10/2012  . Mixed hyperlipidemia 10/10/2012    Past Surgical History:  Procedure Laterality Date  . Ankle fracture repair Left 1985  . CARDIAC CATHETERIZATION     stents x4  . CHOLECYSTECTOMY  1970  . CORONARY STENT INTERVENTION N/A 10/19/2020   Procedure: CORONARY STENT INTERVENTION;  Surgeon: Troy Sine, MD;  Location: [place] [facility];  Service: Cardiovascular;  Laterality: N/A;  . CYSTOSCOPY WITH INSERTION OF UROLIFT N/A 08/27/2017   Procedure: CYSTOSCOPY  WITH INSERTION OF UROLIFT;  Surgeon: Cleon Gustin, MD;  Location: [facility];  Service: Urology;  Laterality: N/A;  . LAPAROSCOPIC APPENDECTOMY  2013  . LEFT HEART CATH AND CORONARY ANGIOGRAPHY N/A 01/02/2018   Procedure: LEFT HEART CATH AND CORONARY ANGIOGRAPHY;  Surgeon: Troy Sine, MD;  Location: [place] [facility];  Service: Cardiovascular;  Laterality: N/A;  . LEFT HEART CATH AND CORONARY ANGIOGRAPHY N/A 10/19/2020   Procedure: LEFT HEART CATH AND CORONARY  ANGIOGRAPHY;  Surgeon: Troy Sine, MD;  Location: [place] [facility];  Service: Cardiovascular;  Laterality: N/A;  . Right total knee replacement Bilateral 2005       Family History  Problem Relation Age of Onset  . Colon cancer Mother   . CAD Father     Social History   Tobacco Use  . Smoking status: Never Smoker  . Smokeless tobacco: Never Used  Vaping Use  . Vaping Use: Never used  Substance Use Topics  . Alcohol use: No  . Drug use: No    Home Medications Prior to Admission medications   Medication Sig Start Date End Date Taking? Authorizing Provider  amLODipine (NORVASC) 10 MG tablet Take 10 mg by mouth daily. 09/21/20   [provider]  apixaban (ELIQUIS) 5 MG TABS tablet Take 2 tablets twice daily until 3/7. On 3/8 start taking 1 tablet twice daily . 10/21/20   Dwyane Dee, MD  BREO ELLIPTA 200-25 MCG/INH AEPB Inhale 1 Inhaler into the lungs daily. 12/25/17   [provider]  carbidopa-levodopa (SINEMET IR) 10-100 MG per tablet Take 1 tablet by mouth every evening.    [provider]  clopidogrel (PLAVIX) 75 MG tablet Take 1 tablet (75 mg total) by mouth daily. 10/21/20   Dwyane Dee, MD  cyanocobalamin (,VITAMIN B-12,) 1000 MCG/ML injection Inject 1,000 mcg into the muscle every 30 (thirty) days. 08/10/20   [provider]  Dupilumab 300 MG/2ML SOPN Inject 300 mg into the skin every 14 (fourteen) days.    [provider]  Evolocumab (REPATHA SURECLICK) 401 MG/ML SOAJ Inject 140 mg into the skin every 14 (fourteen) days.    [provider]  famotidine (PEPCID) 20 MG tablet Take 1 tablet (20 mg total) by mouth every evening. 10/21/20 10/21/21  Dwyane Dee, MD  finasteride (PROSCAR) 5 MG tablet Take 1 tablet (5 mg total) by mouth daily. 09/22/20   McKenzie, Candee Furbish, MD  folic acid (FOLVITE) 1 MG tablet Take 1 mg by mouth See admin instructions. Mon - Fri    [provider]  hydrochlorothiazide (HYDRODIURIL) 25  MG tablet Take 25 mg by mouth daily.    [provider]  HYDROcodone-acetaminophen (NORCO/VICODIN) 5-325 MG tablet Take 1 tablet by mouth every 6 (six) hours as needed for severe pain. 08/06/20   [provider]  isosorbide mononitrate (IMDUR) 30 MG 24 hr tablet TAKE 1 TABLET BY MOUTH  DAILY Patient taking differently: Take 30 mg by mouth daily. 02/24/20   Satira Sark, MD  losartan (COZAAR) 50 MG tablet Take 50 mg by mouth daily. 08/03/20   [provider]  metoprolol tartrate (LOPRESSOR) 25 MG tablet Take 1 tablet (25 mg total) by mouth 2 (two) times daily. 10/21/20   Dwyane Dee, MD  montelukast (SINGULAIR) 10 MG tablet Take 10 mg by mouth at bedtime.    [provider]  nitroGLYCERIN (NITROSTAT) 0.4 MG SL tablet Place 1 tablet (0.4 mg total) under the tongue every 5 (five) minutes  x 3 doses as needed for chest pain (if no relief after 3rd dose, proceed to the ED for an evaluation or call 911). 06/26/19   Satira Sark, MD  penicillin v potassium (VEETID) 500 MG tablet Take by mouth See admin instructions. TAKE 2 TABLETS BY MOUTH STAT THEN 1 TABLET FOUR TIMES DAILY FOR 7 DAYS 10/14/20   [provider]  potassium chloride (KLOR-CON) 10 MEQ tablet Take 1 tablet (10 mEq total) by mouth daily. 07/12/20 10/10/20  Satira Sark, MD  sulfaSALAzine (AZULFIDINE) 500 MG tablet Take 1,000 mg by mouth 2 (two) times daily.    [provider]  temazepam (RESTORIL) 30 MG capsule Take 30 mg by mouth at bedtime as needed for sleep.     [provider]  Vitamin D, Ergocalciferol, (DRISDOL) 50000 UNITS CAPS capsule Take 50,000 Units by mouth every 7 (seven) days. Thursdays    [provider]    Allergies    Bee venom and Statins  Review of Systems   Review of Systems  Constitutional: Negative for fever.  Respiratory: Negative for shortness of breath.   Cardiovascular: Negative for chest pain.  Gastrointestinal: Positive for  abdominal pain. Negative for nausea and vomiting.  Genitourinary: Negative for dysuria.  All other systems reviewed and are negative.   Physical Exam Updated Vital Signs BP 112/70   Pulse 64   Temp 99 F (37.2 C)   Resp 15   Ht 1.829 m (6')   Wt 106 kg   SpO2 99%   BMI 31.69 kg/m   Physical Exam Vitals and nursing note reviewed.  Constitutional:      Appearance: He is well-developed. He is not ill-appearing.  HENT:     Head: Normocephalic and atraumatic.     Nose: Nose normal.     Mouth/Throat:     Mouth: Mucous membranes are moist.  Eyes:     Pupils: Pupils are equal, round, and reactive to light.  Cardiovascular:     Rate and Rhythm: Normal rate and regular rhythm.     Heart sounds: Normal heart sounds. No murmur heard.   Pulmonary:     Effort: Pulmonary effort is normal. No respiratory distress.     Breath sounds: Normal breath sounds. No wheezing.  Abdominal:     General: Bowel sounds are normal.     Palpations: Abdomen is soft.     Tenderness: There is abdominal tenderness. There is no rebound.     Comments: Suprapubic and left lower quadrant tenderness to palpation without rebound or guarding  Musculoskeletal:     Cervical back: Neck supple.     Right lower leg: No edema.     Left lower leg: No edema.  Lymphadenopathy:     Cervical: No cervical adenopathy.  Skin:    General: Skin is warm and dry.  Neurological:     Mental Status: He is alert and oriented to person, place, and time.  Psychiatric:        Mood and Affect: Mood normal.     ED Results / Procedures / Treatments   Labs (all labs ordered are listed, but only abnormal results are displayed) Labs Reviewed  CBC WITH DIFFERENTIAL/PLATELET - Abnormal; Notable for the following components:      Result Value   WBC 14.6 (*)    RBC 4.19 (*)    Hemoglobin 11.6 (*)    HCT 36.0 (*)    Neutro Abs 11.5 (*)    Monocytes Absolute 1.1 (*)  Abs Immature Granulocytes 0.08 (*)    All other components  within normal limits  COMPREHENSIVE METABOLIC PANEL - Abnormal; Notable for the following components:   Sodium 129 (*)    Chloride 95 (*)    Glucose, Bld 113 (*)    Total Bilirubin 1.3 (*)    All other components within normal limits  URINALYSIS, ROUTINE W REFLEX MICROSCOPIC - Abnormal; Notable for the following components:   Specific Gravity, Urine 1.041 (*)    All other components within normal limits  RESP PANEL BY RT-PCR (FLU A&B, COVID) ARPGX2  LIPASE, BLOOD    EKG EKG Interpretation  Date/Time:  Tuesday November 02 2020 00:56:01 EDT Ventricular Rate:  59 PR Interval:    QRS Duration: 109 QT Interval:  425 QTC Calculation: 421 R Axis:   43 Text Interpretation: Sinus rhythm Prolonged PR interval Abnormal R-wave progression, early transition Confirmed by Thayer Jew [phone]) on 11/02/2020 1:02:10 AM   Radiology No results found.  Procedures Procedures   Medications Ordered in ED Medications  0.9 %  sodium chloride infusion (has no administration in time range)  morphine 4 MG/ML injection 4 mg (has no administration in time range)  ondansetron (ZOFRAN) injection 4 mg (has no administration in time range)  piperacillin-tazobactam (ZOSYN) IVPB 3.375 g (has no administration in time range)    ED Course  I have reviewed the triage vital signs and the nursing notes.  Pertinent labs & imaging results that were available during my care of the patient were reviewed by me and considered in my medical decision making (see chart for details).    MDM Rules/Calculators/A&P                          Patient presents from his GI doctor in [place] with a diagnosis of diverticulitis with abscess requiring IV antibiotics.  He is overall nontoxic and vital signs are reassuring.  He is afebrile.  He is tender on exam without signs of peritonitis.  Unfortunately, do not have collateral supporting information although it does seem consistent with his history as I can see in  the chart.  He was previously scheduled to have a colectomy with Leighton Ruff and was admitted to the hospital in August 2021 with a similar presentation and diverticulitis with abscess.  At that time he improved with Zosyn.  He has a white count of 14.6 with a left shift.  We will plan to treat presumptively.  Patient was given IV Zosyn, fluids, nausea and pain medication.  After speaking with the radiologist, it is highly unlikely we will be able to receive imaging or collateral medical records this evening.  We will plan for admission for presumptive treatment with IV antibiotics.  Son is at the bedside.  He is going to attempt to get a CD in the morning of imaging.  Worse case scenario is the patient will need to be reimaged tomorrow.  Will discuss with admitting hospitalist. Final Clinical Impression(s) / ED Diagnoses Final diagnoses:  Diverticulitis of large intestine with abscess without bleeding    Rx / DC Orders ED Discharge Orders    None       Merryl Hacker, MD 11/02/20 0147

## 2020-11-02 NOTE — Consult Note (Addendum)
Cardiology Consultation:   Patient ID: Demitrus Francisco MRN: [id number]; DOB: 1946-03-18  Admit date: 11/01/2020 Date of Consult: 11/02/2020  PCP:  [name], [place]  Cardiologist:  Rozann Lesches, MD 07/12/2020 Advanced Practice Provider:  No care team member to display Electrophysiologist:  None    Patient Profile:   Kurk Corniel is a 75 y.o. 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  History of Present Illness:   Mr. Weller was hospitalized with a NSTEMI 02/26-03/10/2020, s/p DES LAD. Dx PE as well. ASA d/c'd at discharge, d/c on Plavix (changed from Brilinta due to epistaxis and hematuria) and treatment dose Eliquis till 03/08, then maintenance dose Eliquis.  He had plan to have a colectomy for recurrent diverticular dz last year, but this was delayed because his wife died.  After his non-STEMI, it was put off for another 6 months.  He was admitted early 03/15 with abdominal pain, diverticulitis and abd abscess, concern that surgery might be needed, the surgeons requested that we hold the Eliquis and Plavix, if possible. At this time, ABX and bowel rest are planned.  He is on heparin but still on the Plavix.  Mr. Burak admits that he was starting to notice problems with his gut prior to being discharged from the hospital on 3/3.  The symptoms worsened after he got home and finally he came back to the hospital..  Since being back in the hospital, he is tolerating clear liquids.  His abdominal symptoms have improved some, but he still feels pretty bad.  However, it is at least possible that he will not have to have surgery this  admission.  After his last significant episode in November 2021, he did well except for some minor flares until now.   Past Medical History:  Diagnosis Date  . Arthritis   . Asthma   . Coronary atherosclerosis of native coronary artery    a. DES x 3 RCA and DES mCx 10/11 - [place] - with residual 50-70% stenosis in the distal Cx in AV groove, 70% distal LAD beyond apex, 70% small D1. b. 12/2017: similar results with multivessel CAD. Patent stents. Medical management recommended.   . Diverticulitis   . GERD (gastroesophageal reflux disease)   . History of kidney stones   . Mixed hyperlipidemia   . Obstructive sleep apnea   . Pulmonary hypertension (HCC)    PASP 50 mmHg  . Restless leg syndrome   . Sarcoidosis   . Statin intolerance   . Ulcerative colitis [facility])     Past Surgical History:  Procedure Laterality Date  . Ankle fracture repair Left 1985  . CARDIAC CATHETERIZATION     stents x4  . CHOLECYSTECTOMY  1970  . CORONARY STENT INTERVENTION N/A 10/19/2020   Procedure: CORONARY STENT INTERVENTION;  Surgeon: Troy Sine, MD;  Location: [facility];  Service: Cardiovascular;  Laterality: N/A;  . CYSTOSCOPY WITH INSERTION OF UROLIFT N/A 08/27/2017   Procedure: CYSTOSCOPY WITH INSERTION OF UROLIFT;  Surgeon: Cleon Gustin, MD;  Location: [facility];  Service: Urology;  Laterality: N/A;  . LAPAROSCOPIC APPENDECTOMY  2013  . LEFT HEART CATH AND CORONARY  ANGIOGRAPHY N/A 01/02/2018   Procedure: LEFT HEART CATH AND CORONARY ANGIOGRAPHY;  Surgeon: Troy Sine, MD;  Location: [facility];  Service: Cardiovascular;  Laterality: N/A;  . LEFT HEART CATH AND CORONARY ANGIOGRAPHY N/A 10/19/2020   Procedure: LEFT HEART CATH AND CORONARY ANGIOGRAPHY;  Surgeon: Troy Sine, MD;  Location: [facility];  Service: Cardiovascular;  Laterality: N/A;  . Right total knee replacement Bilateral 2005     Home Medications:  Prior to Admission medications   Medication Sig  Start Date End Date Taking? Authorizing Provider  amLODipine (NORVASC) 10 MG tablet Take 10 mg by mouth daily. 09/21/20  Yes [provider]  amoxicillin-clavulanate (AUGMENTIN) 875-125 MG tablet Take 1 tablet by mouth every 12 (twelve) hours. 10/25/20 11/03/20 Yes [provider]  apixaban (ELIQUIS) 5 MG TABS tablet Take 2 tablets twice daily until 3/7. On 3/8 start taking 1 tablet twice daily . 10/21/20  Yes Dwyane Dee, MD  BREO ELLIPTA 200-25 MCG/INH AEPB Inhale 1 Inhaler into the lungs daily. 12/25/17  Yes [provider]  carbidopa-levodopa (SINEMET IR) 10-100 MG per tablet Take 1 tablet by mouth at bedtime.   Yes [provider]  clopidogrel (PLAVIX) 75 MG tablet Take 1 tablet (75 mg total) by mouth daily. 10/21/20  Yes Dwyane Dee, MD  cyanocobalamin (,VITAMIN B-12,) 1000 MCG/ML injection Inject 1,000 mcg into the muscle every 30 (thirty) days. 08/10/20  Yes [provider]  Dupilumab 300 MG/2ML SOPN Inject 300 mg into the skin every 14 (fourteen) days.   Yes [provider]  Evolocumab (REPATHA SURECLICK) 170 MG/ML SOAJ Inject 140 mg into the skin every 14 (fourteen) days.   Yes [provider]  famotidine (PEPCID) 20 MG tablet Take 1 tablet (20 mg total) by mouth every evening. 10/21/20 10/21/21 Yes Dwyane Dee, MD  finasteride (PROSCAR) 5 MG tablet Take 1 tablet (5 mg total) by mouth daily. 09/22/20  Yes McKenzie, Candee Furbish, MD  folic acid (FOLVITE) 1 MG tablet Take 1 mg by mouth See admin instructions. Mon - Fri   Yes [provider]  hydrochlorothiazide (HYDRODIURIL) 25 MG tablet Take 25 mg by mouth daily.   Yes [provider]  HYDROcodone-acetaminophen (NORCO/VICODIN) 5-325 MG tablet Take 1 tablet by mouth every 6 (six) hours as needed for severe pain. 08/06/20  Yes [provider]  isosorbide mononitrate (IMDUR) 30 MG 24 hr tablet TAKE 1 TABLET BY MOUTH  DAILY Patient taking differently: Take 30 mg by  mouth daily. 02/24/20  Yes Satira Sark, MD  losartan (COZAAR) 50 MG tablet Take 50 mg by mouth daily. 08/03/20  Yes [provider]  metoprolol tartrate (LOPRESSOR) 25 MG tablet Take 1 tablet (25 mg total) by mouth 2 (two) times daily. 10/21/20  Yes Dwyane Dee, MD  montelukast (SINGULAIR) 10 MG tablet Take 10 mg by mouth at bedtime.   Yes [provider]  nitroGLYCERIN (NITROSTAT) 0.4 MG SL tablet Place 1 tablet (0.4 mg total) under the tongue every 5 (five) minutes x 3 doses as needed for chest pain (if no relief after 3rd dose, proceed to the ED for an evaluation or call 911). 06/26/19  Yes Satira Sark, MD  omeprazole (PRILOSEC) 20 MG capsule Take 20 mg by mouth daily.   Yes [provider]  oxymetazoline (AFRIN) 0.05 % nasal spray Place 1 spray into both nostrils 2 (two) times daily as needed for congestion.   Yes [provider]  penicillin v  potassium (VEETID) 500 MG tablet Take by mouth See admin instructions. TAKE 2 TABLETS BY MOUTH STAT THEN 1 TABLET FOUR TIMES DAILY FOR 7 DAYS 10/14/20  Yes [provider]  potassium chloride (KLOR-CON) 10 MEQ tablet Take 10 mEq by mouth daily.   Yes [provider]  sulfaSALAzine (AZULFIDINE) 500 MG tablet Take 1,000 mg by mouth 2 (two) times daily.   Yes [provider]  temazepam (RESTORIL) 30 MG capsule Take 30 mg by mouth at bedtime as needed for sleep.    Yes [provider]  Vitamin D, Ergocalciferol, (DRISDOL) 50000 UNITS CAPS capsule Take 50,000 Units by mouth every 7 (seven) days. Thursdays   Yes [provider]  amoxicillin (AMOXIL) 500 MG capsule Take 1,500 mg by mouth once as needed. Take before dental appointment    [provider]    Inpatient Medications: Scheduled Meds: . amLODipine  10 mg Oral Daily  . carbidopa-levodopa  1 tablet Oral QPM  . clopidogrel  75 mg Oral Daily  . [START ON 11/24/2020] cyanocobalamin  1,000 mcg Intramuscular  Q30 days  . famotidine  40 mg Oral Daily  . finasteride  5 mg Oral Daily  . fluticasone furoate-vilanterol  1 puff Inhalation Daily  . folic acid  1 mg Oral Once per day on Mon Tue Wed Thu Fri  . isosorbide mononitrate  30 mg Oral Daily  . metoprolol tartrate  25 mg Oral BID   Continuous Infusions: . sodium chloride 75 mL/hr at 11/02/20 1636  . heparin 1,500 Units/hr (11/02/20 1636)  . piperacillin-tazobactam (ZOSYN)  IV Stopped (11/02/20 1436)   PRN Meds: hydrALAZINE, morphine injection, nitroGLYCERIN, temazepam  Allergies:    Allergies  Allergen Reactions  . Bee Venom Anaphylaxis  . Statins Other (See Comments)    Myalgias - Simvastatin and Rosuvastatin    Social History:   Social History   Socioeconomic History  . Marital status: Married    Spouse name: Not on file  . Number of children: Not on file  . Years of education: Not on file  . Highest education level: Not on file  Occupational History  . Occupation: Pastor  Tobacco Use  . Smoking status: Never Smoker  . Smokeless tobacco: Never Used  Vaping Use  . Vaping Use: Never used  Substance and Sexual Activity  . Alcohol use: No  . Drug use: No  . Sexual activity: Yes    Birth control/protection: None  Other Topics Concern  . Not on file  Social History Narrative  . Not on file   Social Determinants of Health   Financial Resource Strain: Not on file  Food Insecurity: Not on file  Transportation Needs: Not on file  Physical Activity: Not on file  Stress: Not on file  Social Connections: Not on file  Intimate Partner Violence: Not on file    Family History:    Family History  Problem Relation Age of Onset  . Colon cancer Mother   . CAD Father     Family Status  Relation Name Status  . Mother  Deceased  . Father  Deceased     ROS:  Please see the history of present illness.  All other ROS reviewed and negative.     Physical Exam/Data:   Vitals:   11/02/20 0545 11/02/20 0736 11/02/20  0751 11/02/20 1519  BP: (!) 114/56  112/67 108/64  Pulse: 97  63 62  Resp: 20  14 17   Temp: 98.2 F (36.8 C)  97.9 F (36.6 C) 98.9 F (37.2 C)  TempSrc: Oral  Oral Oral  SpO2: 97% 97% 93% 98%  Weight: 90.6 kg     Height:        Intake/Output Summary (Last 24 hours) at 11/02/2020 1720 Last data filed at 11/02/2020 1636 Gross per 24 hour  Intake 436.75 ml  Output 575 ml  Net -138.25 ml   Last 3 Weights 11/02/2020 11/01/2020 10/21/2020  Weight (lbs) 199 lb 11.8 oz 233 lb 11 oz 221 lb 6.4 oz  Weight (kg) 90.6 kg 106 kg 100.426 kg     Body mass index is 27.09 kg/m.  General:  Well nourished, well developed, in no acute distress HEENT: normal Lymph: no adenopathy Neck: no JVD Endocrine:  No thryomegaly Vascular: No carotid bruits; 4/4 extremity pulses 2+ bilaterally  Cardiac:  normal S1, S2; RRR; soft murmur  Lungs:  clear to auscultation bilaterally, no wheezing, rhonchi or rales  Abd: soft, nontender, no hepatomegaly  Ext: no edema Musculoskeletal:  No deformities, BUE and BLE strength normal and equal Skin: warm and dry  Neuro:  CNs 2-12 intact, no focal abnormalities noted Psych:  Normal affect   EKG:  The EKG was personally reviewed and demonstrates: Sinus bradycardia with first-degree AV block, heart rate 59, PR interval previously 222 ms Telemetry:  Telemetry was personally reviewed and demonstrates: Sinus rhythm  Relevant CV Studies:  Cath: 10/19/20   Prox LAD lesion is 90% stenosed.  Dist LM lesion is 30% stenosed.  2nd Diag lesion is 70% stenosed.  Mid LAD lesion is 50% stenosed.  Dist LAD-1 lesion is 90% stenosed.  Dist LAD-2 lesion is 80% stenosed.  Post intervention, there is a 0% residual stenosis.  2nd Mrg lesion is 50% stenosed.  Mid Cx to Dist Cx lesion is 70% stenosed.  Previously placed Prox Cx to Mid Cx stent (unknown type) is widely patent.  Ost Cx to Prox Cx lesion is 20% stenosed.  Previously placed Ost RCA to Prox RCA stent (unknown  type) is widely patent.  Previously placed Dist RCA stent (unknown type) is widely patent.  Prox RCA to Mid RCA lesion is 50% stenosed.  Mid RCA-1 lesion is 50% stenosed.  Previously placed Mid RCA-2 stent (unknown type) is widely patent.  RV Branch lesion is 95% stenosed.  RPDA lesion is 70% stenosed.  RPAV lesion is 50% stenosed.  A stent was successfully placed.  Significant three-vessel CAD with previously placed stents in the circumflex, and RCA which remain patent.  The LAD has 30% smooth ostial narrowing followed by 90% proximal LAD stenosis before the initial septal perforating artery. The mid LAD and diagonal vessel is previously noted 70 and 50% stenoses. There is diffuse distal apical LAD disease of 90 and 80%.  The circumflex stent is widely patent. There is mild proximal 20% narrowing. There is mid AV groove circumflex stenoses of 70% with 50% OM 2 stenosis.  The RCA has patent stents at the ostium, proximal to the acute margin, and prior to the PDA vessel. There is previously noted 50% stenoses slightly improved from previously in the mid RCA with old 95% ostial stenosis and a small marginal branch. The PDA is small and has ostial 70% stenosis and there is 50% continuation branch stenosis in the distal RCA.  Successful PCI with DES stenting of the proximal LAD with ultimate insertion of a 4.0 x 15 mm Resolute Onyx stent postdilated with stent taper from 4.45 to 4.20 mm with residual narrowing 0%  and brisk TIMI-3 flow.  LVEDP 13 mm.  RECOMMENDATION: DAPT therapy ideally for greater than 1 year. The patient will have to defer his planned upcoming surgery and possibly this can be done after 6 months if he remains stable. Recommend increase medical therapy for concomitant CAD with increase nitrates, beta-blocker therapy, consider initiation of ranolazine. Continue aggressive lipid-lowering therapy with PCSK9 inhibition.  Diagnostic Dominance:  Right    Intervention    Echo: 10/17/20 IMPRESSIONS  1. Left ventricular ejection fraction, by estimation, is 70 to 75%. The  left ventricle has hyperdynamic function. The left ventricle has no  regional wall motion abnormalities. There is mild left ventricular  hypertrophy of the basal-septal segment. Left  ventricular diastolic parameters are consistent with Grade I diastolic  dysfunction (impaired relaxation).  2. Right ventricular systolic function is normal. The right ventricular  size is normal.  3. The mitral valve is normal in structure. Trivial mitral valve  regurgitation. No evidence of mitral stenosis.  4. The aortic valve is tricuspid. Aortic valve regurgitation is not  visualized. Mild aortic valve sclerosis is present, with no evidence of  aortic valve stenosis.   Laboratory Data:  High Sensitivity Troponin:   Recent Labs  Lab 10/17/20 0236 10/17/20 0448 10/17/20 0729 10/17/20 0923 10/17/20 1138  TROPONINIHS 66* 137* 178* 207* 196*     Chemistry Recent Labs  Lab 11/01/20 2118  NA 129*  K 3.9  CL 95*  CO2 24  GLUCOSE 113*  BUN 16  CREATININE 1.08  CALCIUM 9.4  GFRNONAA >60  ANIONGAP 10    Recent Labs  Lab 11/01/20 2118  PROT 7.5  ALBUMIN 3.6  AST 17  ALT 5  ALKPHOS 73  BILITOT 1.3*   Hematology Recent Labs  Lab 11/01/20 2118  WBC 14.6*  RBC 4.19*  HGB 11.6*  HCT 36.0*  MCV 85.9  MCH 27.7  MCHC 32.2  RDW 15.0  PLT 376   BNPNo results for input(s): BNP, PROBNP in the last 168 hours.  DDimer No results for input(s): DDIMER in the last 168 hours. Lab Results  Component Value Date   CHOL 88 10/19/2020   HDL 47 10/19/2020   LDLCALC 3 10/19/2020   TRIG 190 (H) 10/19/2020   CHOLHDL 1.9 10/19/2020   Lab Results  Component Value Date   TSH 2.119 10/19/2020   Lab Results  Component Value Date   HGBA1C 6.2 (H) 10/16/2020    Radiology/Studies:  No results found.   Assessment and Plan:   Principal Problem:    Intra-abdominal abscess ([place]) Active Problems:   BPH (benign prostatic hyperplasia)   Essential hypertension   Sarcoidosis   Pulmonary embolism ([place])   1. Preoperative cardiovascular exam: -He has been recently revascularized -> with DES stent in the setting of non-STEMI, less than 1 month ago.. -Prior to his acute illness, he was increasing his activity post NSTEMI. -No further work-up or evaluation needs to be done prior to any surgical procedure needed.  2.  CAD-s/p MV PCI: Most recent DES PCI to proximal LAD on 10/20/2020 for non-STEMI -Medical therapy for residual disease, including a 90% acute marginal -Not on ASA because of epistaxis and hematuria when on aspirin, Plavix, and Eliquis -Brilinta changed to Eliquis for the same reason -He is currently on Plavix and heparin -He is on his home dose of metoprolol and Imdur -Monitor for ischemic symptoms -Continue the Plavix for now, if he continues to improve, it is likely that urgent surgery will not be needed  3.  Hypertension: -He is on his home dose of beta-blocker and amlodipine, but is not on his home medications of HCTZ 25 mg daily, and losartan 50 mg daily -Blood pressure is currently well controlled, no med changes  4.  Recent pulmonary embolism-on Eliquis -> converted to heparin while inpatient.  Otherwise, per IM, CCS     Signed, Rosaria Ferries, PA-C  11/02/2020 5:20 PM   ATTENDING ATTESTATION  I have seen, examined and evaluated the patient this PM along with Rosaria Ferries, PA-C.  After reviewing all the available data and chart, we discussed the patients laboratory, study & physical findings as well as symptoms in detail. I agree with her findings, examination as well as impression recommendations as per our discussion.    Attending adjustments noted in italics.   Difficult situation where the patient has had a recent non-STEMI with DES PCI within the month, and also a PE on Eliquis.   With the addition of  Eliquis, Brilinta was discontinued along with aspirin and converted to Plavix alone. With recent non-STEMI and DES PCI, the recommendation is uninterrupted antiplatelet agent for a year, however for urgent procedures exceptions can be made, but not within the first month.  3 to 6 months there is somewhat decreased risk and after 6 months, likely acceptable.   -- However within the first month, stopping clopidogrel would require bridging with IV Aggrastat. --Also with recent PE, would need to bridge Eliquis with heparin.  As such, this appears that he is doing clinically better, now able to tolerate some p.o. intake.  Not having intractable pain.  Best case scenario will be that he is able to tolerate this episode and not require surgery.   Cardiology will continue to monitor in case the situation changes, but currently appears that he is not requiring surgery.  If he is going to require surgery, please let us know we can assist with the bridging technique.    Glenetta Hew, M.D., M.S. Interventional Cardiologist   Pager # [phone] Phone # [phone] [address] [place], [place] [address]     For questions or updates, please contact [place] Please consult [email or web address] for contact info under

## 2020-11-02 NOTE — Evaluation (Signed)
Physical Therapy Evaluation Patient Details Name: Richard Webb MRN: [id number] DOB: [birth year] Today's Date: 11/02/2020   History of Present Illness  Pt is 75 yo male admitted on 11/01/20 with abdominal discomfort due to diverticular abscess.  Pt has hx of diverticulitis and was to have colon resection later this month but cancelled when pt admitted with NSTEMI requiring stent and developed PE ther first week of March.  Further PMH includes HTN, BPH, CAD< sarcoidosis.  Clinical Impression   Pt admitted with above diagnosis.  He has been moving in room independently.  Pt demonstrated safe transfers and normal gait pattern with PT and with all VSS.  No further skilled PT indicated.  Encourage pt to continue to mobilize.     Follow Up Recommendations No PT follow up (pt supposed to start cardiac rehab this week)    Equipment Recommendations  None recommended by PT    Recommendations for Other Services       Precautions / Restrictions Precautions Precautions: None      Mobility  Bed Mobility Overal bed mobility: Independent                  Transfers Overall transfer level: Independent               General transfer comment: Pt has been transferring in room on his own.  Demonstrated safely to PT.  Ambulation/Gait Ambulation/Gait assistance: Supervision;Independent Gait Distance (Feet): 300 Feet Assistive device: None Gait Pattern/deviations: WFL(Within Functional Limits) Gait velocity: normal   General Gait Details: Pt has been ambulating and performing ADLs in room independently.  He demonstrated safe gait in hallway with PT with HR in the 80's and normal gait pattern  Stairs            Wheelchair Mobility    Modified Rankin (Stroke Patients Only)       Balance Overall balance assessment: Independent                                           Pertinent Vitals/Pain Pain Assessment: 0-10 Pain Score: 5  Pain Location:  stomach Pain Descriptors / Indicators: Discomfort Pain Intervention(s): Limited activity within patient's tolerance;Monitored during session;Premedicated before session    Home Living Family/patient expects to be discharged to:: Private residence Living Arrangements: Alone Available Help at Discharge: Family;Available PRN/intermittently Type of Home: House Home Access: Level entry     Home Layout: One level;Laundry or work area in [employer]: None      Prior Function Level of Independence: Independent         Comments: Very independent; works as Education officer, environmental; was supposed to start cardiac rehab tomorrow but now hospitalized - 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 and 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     Hand Dominance        Extremity/Trunk Assessment   Upper Extremity Assessment Upper Extremity Assessment: Overall WFL for tasks assessed    Lower Extremity Assessment Lower Extremity Assessment: Overall WFL for tasks assessed    Cervical / Trunk Assessment Cervical / Trunk Assessment: Normal  Communication   Communication: No difficulties  Cognition Arousal/Alertness: Awake/alert Behavior During Therapy: WFL for tasks assessed/performed Overall Cognitive Status: Within Functional Limits for tasks assessed  General Comments General comments (skin integrity, edema, etc.): VSS    Exercises     Assessment/Plan    PT Assessment Patent does not need any further PT services  PT Problem List         PT Treatment Interventions      PT Goals (Current goals can be found in the Care Plan section)  Acute Rehab PT Goals Patient Stated Goal: return hom PT Goal Formulation: All assessment and education complete, DC therapy    Frequency     Barriers to discharge        Co-evaluation               AM-PAC PT "6 Clicks" Mobility  Outcome Measure Help needed turning from your back to your  side while in a flat bed without using bedrails?: None Help needed moving from lying on your back to sitting on the side of a flat bed without using bedrails?: None Help needed moving to and from a bed to a chair (including a wheelchair)?: None Help needed standing up from a chair using your arms (e.g., wheelchair or bedside chair)?: None Help needed to walk in hospital room?: None Help needed climbing 3-5 steps with a railing? : None 6 Click Score: 24    End of Session   Activity Tolerance: Patient tolerated treatment well Patient left: in bed;with call bell/phone within reach;with family/visitor present Nurse Communication: Mobility status      Time: 4696-2952 PT Time Calculation (min) (ACUTE ONLY): 15 min   Charges:   PT Evaluation $PT Eval Low Complexity: 1 Low          Bodi Palmeri, PT Acute Rehab Services Pager [phone] [facility] [phone]    Rayetta Humphrey 11/02/2020, 1:17 PM

## 2020-11-02 NOTE — Progress Notes (Signed)
Pharmacy Antibiotic Note  Richard Webb is a 75 y.o. male admitted on 11/01/2020 with diverticulitis with abscess.  Pharmacy has been consulted for Zosyn dosing. WBC is elevated at 14.6. Renal function good.   Plan: Zosyn 3.375G IV q8h to be infused over 4 hours Trend WBC, temp, renal function  F/U infectious work-up   Height: 6' (182.9 cm) Weight: 106 kg (233 lb 11 oz) IBW/kg (Calculated) : 77.6  Temp (24hrs), Avg:99 F (37.2 C), Min:99 F (37.2 C), Max:99 F (37.2 C)  Recent Labs  Lab 11/01/20 2118  WBC 14.6*  CREATININE 1.08    Estimated Creatinine Clearance: 75.5 mL/min (by C-G formula based on SCr of 1.08 mg/dL).    Allergies  Allergen Reactions  . Bee Venom Anaphylaxis  . Statins Other (See Comments)    Myalgias - Simvastatin and Rosuvastatin    Narda Bonds, PharmD, BCPS Clinical Pharmacist Phone: [phone]

## 2020-11-02 NOTE — Progress Notes (Signed)
OT Cancellation Note  Patient Details Name: Richard Webb MRN: [id number] DOB: Jul 17, 1946   Cancelled Treatment:    Reason Eval/Treat Not Completed: OT screened, no needs identified, will sign off  [place], OT/L   Acute OT Clinical Specialist [place] Pager [phone] Office [phone]  11/02/2020, 2:34 PM

## 2020-11-02 NOTE — Progress Notes (Signed)
PROGRESS NOTE                                                                                                                                                                                                             Patient Demographics:    Richard Webb, is a 75 y.o. male, DOB - [birth year], VEH:[id number]  Outpatient Primary MD for the patient is [name], MD    LOS - 0  Admit date - 11/01/2020    Chief Complaint  Patient presents with  . Diverticulitis w/Abscess       Brief Narrative (HPI from H&P) Richard Webb is a 75 y.o. male with history of hypertension, diverticulitis, BPH, hypertension, CAD, sarcoidosis, recent MI and PE, who was recently admitted for NSTEMI requiring drug-eluting stent placement to LAD March 1 week of 2022, he also had acute PE that admission requiring treatment with Eliquis.  Of note patient has had diverticulitis issues for a while he was originally scheduled for outpatient colon resection by Dr. Henri Webb later this month but this was canceled as he developed heart issues earlier this month.  He developed abdominal discomfort and initially presented to outlying hospital where he was diagnosed with diverticular abscess and transferred to [facility] for further care.   Subjective:    Richard Webb today has, No headache, No chest pain, +ve abdominal pain - No Nausea, No new weakness tingling or numbness, no SOB   Assessment  & Plan :     1. Abdominal pain due to diverticular abscess in a patient with history of diverticulitis who was originally scheduled for outpatient colon resection later this month, this procedure was canceled as he developed NSTEMI requiring stent placement on March 1 of 2020 - he is currently being treated conservatively with bowel rest, IV fluids and antibiotics.  I have consulted general surgery.  Due to his recent drug-eluting stent placement on March 1  will be extremely difficult to hold antiplatelet medications, will follow with general surgery's recommendation he might recommend IR with drain placement.  2.  History of NSTEMI with drug-eluting stent to LAD on October 19, 2020.  Continue Plavix, also takes Repatha shots every 2 weeks.  Cardiology to see.  3.  Recent subsegmental PE.  Placed on heparin drip instead of Eliquis.  4.  Hypertension.  On combination of Norvasc,  Imdur and beta-blocker.  Monitor.  5.  Dyslipidemia.  On Repatha shots twice a month.  6.  BPH.  On finasteride.  7.  GERD.  On Pepcid.  8. OSA - CPAP QHS.  9.  History of ulcerative colitis.  Supportive care.  10.  History of restless leg syndrome.  Continue combination of carbidopa levodopa.      Condition - Extremely Guarded  Family Communication  : Richard Webb [id number] 11/02/20  Code Status :  Full  Consults  :   Cards, CCS  PUD Prophylaxis : pepcid   Procedures  :           Disposition Plan  :    Status is: Inpatient  Remains inpatient appropriate because:IV treatments appropriate due to intensity of illness or inability to take PO   Dispo: The patient is from: Home              Anticipated d/c is to: Home              Patient currently is not medically stable to d/c.   Difficult to place patient No   DVT Prophylaxis  :   Heparin gtt  Lab Results  Component Value Date   PLT 376 11/01/2020    Diet :  Diet Order            Diet NPO time specified Except for: Ice Chips, Sips with Meds  Diet effective now                  Inpatient Medications  Scheduled Meds: . amLODipine  10 mg Oral Daily  . carbidopa-levodopa  1 tablet Oral QPM  . clopidogrel  75 mg Oral Daily  . [START ON 11/24/2020] cyanocobalamin  1,000 mcg Intramuscular Q30 days  . finasteride  5 mg Oral Daily  . fluticasone furoate-vilanterol  1 puff Inhalation Daily  . folic acid  1 mg Oral Once per day on Mon Tue Wed Thu Fri  . isosorbide mononitrate  30 mg Oral  Daily  . metoprolol tartrate  25 mg Oral BID   Continuous Infusions: . sodium chloride 75 mL/hr at 11/02/20 0907  . heparin 1,400 Units/hr (11/02/20 0509)  . piperacillin-tazobactam (ZOSYN)  IV 3.375 g (11/02/20 1030)   PRN Meds:.hydrALAZINE, morphine injection, temazepam  Antibiotics  :    Anti-infectives (From admission, onward)   Start     Dose/Rate Route Frequency Ordered Stop   11/02/20 1000  piperacillin-tazobactam (ZOSYN) IVPB 3.375 g        3.375 g 12.5 mL/hr over 240 Minutes Intravenous Every 8 hours 11/02/20 0412     11/02/20 0045  piperacillin-tazobactam (ZOSYN) IVPB 3.375 g        3.375 g 100 mL/hr over 30 Minutes Intravenous  Once 11/02/20 0044 11/02/20 0451       Time Spent in minutes  30   Lala Lund M.D on 11/02/2020 at 11:46 AM  To page go to [email or web address]   [facility] -  Office  [phone]     See all Orders from today for further details    Objective:   Vitals:   11/02/20 0515 11/02/20 0545 11/02/20 0736 11/02/20 0751  BP: 115/63 (!) 114/56  112/67  Pulse: 64 97  63  Resp: 20 20  14   Temp: 98.5 F (36.9 C) 98.2 F (36.8 C)  97.9 F (36.6 C)  TempSrc: Oral Oral  Oral  SpO2: 96% 97% 97% (!) 89%  Weight:  90.6 kg  Height:        Wt Readings from Last 3 Encounters:  11/02/20 90.6 kg  10/21/20 100.4 kg  09/22/20 97.5 kg    No intake or output data in the 24 hours ending 11/02/20 1146   Physical Exam  Awake Alert, No new F.N deficits, Normal affect Richard Webb.AT,PERRAL Supple Neck,No JVD, No cervical lymphadenopathy appriciated.  Symmetrical Chest wall movement, Good air movement bilaterally, CTAB RRR,No Gallops,Rubs or new Murmurs, No Parasternal Heave +ve B.Sounds, Abd Soft, +ve lower abdominal tenderness, No organomegaly appriciated, No rebound - guarding or rigidity. No Cyanosis, Clubbing or edema, No new Rash or bruise       Data Review:    CBC Recent Labs  Lab 11/01/20 2118  WBC 14.6*  HGB 11.6*  HCT 36.0*   PLT 376  MCV 85.9  MCH 27.7  MCHC 32.2  RDW 15.0  LYMPHSABS 1.7  MONOABS 1.1*  EOSABS 0.2  BASOSABS 0.1    Recent Labs  Lab 11/01/20 2118 11/02/20 0727  NA 129*  --   K 3.9  --   CL 95*  --   CO2 24  --   GLUCOSE 113*  --   BUN 16  --   CREATININE 1.08  --   CALCIUM 9.4  --   AST 17  --   ALT 5  --   ALKPHOS 73  --   BILITOT 1.3*  --   ALBUMIN 3.6  --   PROCALCITON  --  <0.10    ------------------------------------------------------------------------------------------------------------------ No results for input(s): CHOL, HDL, LDLCALC, TRIG, CHOLHDL, LDLDIRECT in the last 72 hours.  Lab Results  Component Value Date   HGBA1C 6.2 (H) 10/16/2020   ------------------------------------------------------------------------------------------------------------------ No results for input(s): TSH, T4TOTAL, T3FREE, THYROIDAB in the last 72 hours.  Invalid input(s): FREET3  Cardiac Enzymes No results for input(s): CKMB, TROPONINI, MYOGLOBIN in the last 168 hours.  Invalid input(s): CK ------------------------------------------------------------------------------------------------------------------    Component Value Date/Time   BNP 10 10/18/2017 1648    Micro Results Recent Results (from the past 240 hour(s))  Resp Panel by RT-PCR (Flu A&B, Covid) Nasopharyngeal Swab     Status: None   Collection Time: 11/02/20  1:59 AM   Specimen: Nasopharyngeal Swab; Nasopharyngeal(NP) swabs in vial transport medium  Result Value Ref Range Status   SARS Coronavirus 2 by RT PCR NEGATIVE NEGATIVE Final    Comment: (NOTE) SARS-CoV-2 target nucleic acids are NOT DETECTED.  The SARS-CoV-2 RNA is generally detectable in upper respiratory specimens during the acute phase of infection. The lowest concentration of SARS-CoV-2 viral copies this assay can detect is 138 copies/mL. A negative result does not preclude SARS-Cov-2 infection and should not be used as the sole basis for  treatment or other patient management decisions. A negative result may occur with  improper specimen collection/handling, submission of specimen other than nasopharyngeal swab, presence of viral mutation(s) within the areas targeted by this assay, and inadequate number of viral copies(<138 copies/mL). A negative result must be combined with clinical observations, patient history, and epidemiological information. The expected result is Negative.  Fact Sheet for Patients:  EntrepreneurPulse.com.au  Fact Sheet for Healthcare Providers:  IncredibleEmployment.be  This test is no t yet approved or cleared by the [place] FDA and  has been authorized for detection and/or diagnosis of SARS-CoV-2 by FDA under an Emergency Use Authorization (EUA). This EUA will remain  in effect (meaning this test can be used) for the duration of the COVID-19 declaration under Section 564(b)(1) of  the Act, 21 U.S.C.section 360bbb-3(b)(1), unless the authorization is terminated  or revoked sooner.       Influenza A by PCR NEGATIVE NEGATIVE Final   Influenza B by PCR NEGATIVE NEGATIVE Final    Comment: (NOTE) The Xpert Xpress SARS-CoV-2/FLU/RSV plus assay is intended as an aid in the diagnosis of influenza from Nasopharyngeal swab specimens and should not be used as a sole basis for treatment. Nasal washings and aspirates are unacceptable for Xpert Xpress SARS-CoV-2/FLU/RSV testing.  Fact Sheet for Patients: EntrepreneurPulse.com.au  Fact Sheet for Healthcare Providers: IncredibleEmployment.be  This test is not yet approved or cleared by the [place] FDA and has been authorized for detection and/or diagnosis of SARS-CoV-2 by FDA under an Emergency Use Authorization (EUA). This EUA will remain in effect (meaning this test can be used) for the duration of the COVID-19 declaration under Section 564(b)(1) of the Act, 21  U.S.C. section 360bbb-3(b)(1), unless the authorization is terminated or revoked.  Performed at [facility], [place] [address] [place], [place] [address]     Radiology Reports CT ANGIO CHEST PE W OR WO CONTRAST  Result Date: 10/17/2020 CLINICAL DATA:  Onset of chest pain and shortness of breath last night. Abnormal chest x-ray. History of sarcoidosis, asthma and hypertension. Clinical suspicion of pulmonary embolism. EXAM: CT ANGIOGRAPHY CHEST WITH CONTRAST TECHNIQUE: Multidetector CT imaging of the chest was performed using the standard protocol during bolus administration of intravenous contrast. Multiplanar CT image reconstructions and MIPs were obtained to evaluate the vascular anatomy. CONTRAST:  165m OMNIPAQUE IOHEXOL 350 MG/ML SOLN COMPARISON:  Chest radiographs 10/16/2020. Abdominal CT 02/15/2020 and 04/17/2020. FINDINGS: Cardiovascular: The pulmonary arteries are well opacified with contrast to the level of the subsegmental branches. There is a single occluded subsegmental branch in the superior segment of the left lower lobe (images 28 through 34 of series 4), consistent with pulmonary embolism. The pulmonary arteries are otherwise patent, and there are no central pulmonary emboli. There is central enlargement of the pulmonary arteries consistent with pulmonary arterial hypertension. There is moderate atherosclerosis of the aorta, great vessels and coronary arteries. The heart size is normal. There is no pericardial effusion. Mediastinum/Nodes: There are no enlarged mediastinal, hilar or axillary lymph nodes.There are scattered small partially calcified mediastinal and hilar lymph nodes bilaterally. The thyroid gland, trachea and esophagus demonstrate no significant findings. Lungs/Pleura: There is no pleural effusion or pneumothorax. There is extensive upper lobe predominant chronic lung disease with thickening of the bronchovascular bundles, peribronchovascular scarring and traction  bronchiectasis, compatible with chronic sarcoidosis. The basilar components have progressed compared with the prior abdominal CT. Scattered mucous plugging is noted. There is no confluent airspace opacity or suspicious pulmonary nodule. Upper abdomen: Nodular parents of the liver again noted suspicious for cirrhosis. No adrenal mass. Musculoskeletal/Chest wall: There is no chest wall mass or suspicious osseous finding. Review of the MIP images confirms the above findings. IMPRESSION: 1. Single small focus of pulmonary embolism involving a subsegmental branch of the superior segment of the left lower lobe. This is of questionable significance regarding the patient's symptoms. No central pulmonary emboli. Consider lower extremity venous doppler ultrasound. 2. Central enlargement of the pulmonary arteries consistent with pulmonary arterial hypertension. 3. 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 The basilar components have progressed compared with the prior abdominal CT and superimposed inflammation/infection is not completely excluded. No evidence of neoplasm. 4. Aortic Atherosclerosis (ICD10-I70.0). 5. Critical Value/emergent results were called by telephone at the time of  interpretation on 10/17/2020 at 4:16 pm to provider Aletta Edouard, who verbally acknowledged these results. Electronically Signed   By: Richardean Sale M.D.   On: 10/17/2020 16:17   CARDIAC CATHETERIZATION  Result Date: 10/19/2020  Prox LAD lesion is 90% stenosed.  Dist LM lesion is 30% stenosed.  2nd Diag lesion is 70% stenosed.  Mid LAD lesion is 50% stenosed.  Dist LAD-1 lesion is 90% stenosed.  Dist LAD-2 lesion is 80% stenosed.  Post intervention, there is a 0% residual stenosis.  2nd Mrg lesion is 50% stenosed.  Mid Cx to Dist Cx lesion is 70% stenosed.  Previously placed Prox Cx to Mid Cx stent (unknown type) is widely patent.  Ost Cx to Prox Cx lesion is 20% stenosed.  Previously  placed Ost RCA to Prox RCA stent (unknown type) is widely patent.  Previously placed Dist RCA stent (unknown type) is widely patent.  Prox RCA to Mid RCA lesion is 50% stenosed.  Mid RCA-1 lesion is 50% stenosed.  Previously placed Mid RCA-2 stent (unknown type) is widely patent.  RV Branch lesion is 95% stenosed.  RPDA lesion is 70% stenosed.  RPAV lesion is 50% stenosed.  A stent was successfully placed.  Significant three-vessel CAD with previously placed stents in the circumflex, and RCA which remain patent. The LAD has 30% smooth ostial narrowing followed by 90% proximal LAD stenosis before the initial septal perforating artery.  The mid LAD and diagonal vessel is previously noted 70 and 50% stenoses.  There is diffuse distal apical LAD disease of 90 and 80%. The circumflex stent is widely patent.  There is mild proximal 20% narrowing.  There is mid AV groove circumflex stenoses of 70% with 50% OM 2 stenosis. The RCA has patent stents at the ostium, proximal to the acute margin, and prior to the PDA vessel.  There is previously noted 50% stenoses slightly improved from previously in the mid RCA with old 95% ostial stenosis and a small marginal branch.  The PDA is small and has ostial 70% stenosis and there is 50% continuation branch stenosis in the distal RCA. Successful PCI with DES stenting of the proximal LAD with ultimate insertion of a 4.0 x 15 mm Resolute Onyx stent postdilated with stent taper from 4.45 to 4.20 mm with residual narrowing 0% and brisk TIMI-3 flow. LVEDP 13 mm. RECOMMENDATION: DAPT therapy ideally for greater than 1 year.  The patient will have to defer his planned upcoming surgery and possibly this can be done after 6 months if he remains stable.  Recommend increase medical therapy for concomitant CAD with increase nitrates, beta-blocker therapy, consider initiation of ranolazine.  Continue aggressive lipid-lowering therapy with PCSK9 inhibition.   DG Chest Port 1 View  Result  Date: 10/16/2020 CLINICAL DATA:  Chest pain. EXAM: PORTABLE CHEST 1 VIEW COMPARISON:  None. FINDINGS: The heart size and mediastinal contours are within normal limits. No pneumothorax or pleural effusion is noted. Bilateral upper lobe opacities are noted which may represent pneumonia or possibly edema, but neoplasm cannot be excluded. The visualized skeletal structures are unremarkable. IMPRESSION: Bilateral upper lobe opacities are noted which may represent pneumonia or possibly edema, but neoplasm cannot be excluded. CT scan of the chest is recommended for further evaluation. Electronically Signed   By: Marijo Conception M.D.   On: 10/16/2020 22:45   ECHOCARDIOGRAM COMPLETE  Result Date: 10/17/2020    ECHOCARDIOGRAM REPORT   Patient Name:   CRIST KRUSZKA Date of Exam: 10/17/2020 Medical Rec #:  [id number]        Height:       72.0 in Accession #:    [id number]       Weight:       214.9 lb Date of Birth:  11/19/1945        BSA:          2.197 m Patient Age:    1 years         BP:           119/72 mmHg Patient Gender: M                HR:           60 bpm. Exam Location:  Inpatient Procedure: 2D Echo, Cardiac Doppler and Color Doppler Indications:    Dyspnea R06.00  History:        Patient has no prior history of Echocardiogram examinations and                 Patient has prior history of Echocardiogram examinations, most                 recent 08/10/2020. CAD, Pulmonary HTN; Risk Factors:Dyslipidemia                 and Sleep Apnea. Sarcoidosis. GERD. Asthma.  Sonographer:    Darlina Sicilian RDCS Referring Phys: [place]  1. Left ventricular ejection fraction, by estimation, is 70 to 75%. The left ventricle has hyperdynamic function. The left ventricle has no regional wall motion abnormalities. There is mild left ventricular hypertrophy of the basal-septal segment. Left ventricular diastolic parameters are consistent with Grade I diastolic dysfunction (impaired relaxation).  2. Right  ventricular systolic function is normal. The right ventricular size is normal.  3. The mitral valve is normal in structure. Trivial mitral valve regurgitation. No evidence of mitral stenosis.  4. The aortic valve is tricuspid. Aortic valve regurgitation is not visualized. Mild aortic valve sclerosis is present, with no evidence of aortic valve stenosis. FINDINGS  Left Ventricle: Left ventricular ejection fraction, by estimation, is 70 to 75%. The left ventricle has hyperdynamic function. The left ventricle has no regional wall motion abnormalities. The left ventricular internal cavity size was normal in size. There is mild left ventricular hypertrophy of the basal-septal segment. Left ventricular diastolic parameters are consistent with Grade I diastolic dysfunction (impaired relaxation). Right Ventricle: The right ventricular size is normal. Right ventricular systolic function is normal. Left Atrium: Left atrial size was normal in size. Right Atrium: Right atrial size was normal in size. Pericardium: There is no evidence of pericardial effusion. Mitral Valve: The mitral valve is normal in structure. Mild mitral annular calcification. Trivial mitral valve regurgitation. No evidence of mitral valve stenosis. Tricuspid Valve: The tricuspid valve is normal in structure. Tricuspid valve regurgitation is trivial. No evidence of tricuspid stenosis. Aortic Valve: The aortic valve is tricuspid. Aortic valve regurgitation is not visualized. Mild aortic valve sclerosis is present, with no evidence of aortic valve stenosis. Pulmonic Valve: The pulmonic valve was normal in structure. Pulmonic valve regurgitation is trivial. No evidence of pulmonic stenosis. Aorta: The aortic root is normal in size and structure. Venous: The inferior vena cava was not well visualized. IAS/Shunts: The interatrial septum was not well visualized.  LEFT VENTRICLE PLAX 2D LVIDd:         4.44 cm  Diastology LVIDs:         2.35 cm  LV e' medial:  5.33 cm/s LV PW:         0.89 cm  LV E/e' medial:  13.6 LV IVS:        1.38 cm  LV e' lateral:   5.44 cm/s LVOT diam:     2.30 cm  LV E/e' lateral: 13.3 LV SV:         110 LV SV Index:   50 LVOT Area:     4.15 cm                          3D Volume EF:                         3D EF:        54 %                         LV EDV:       130 ml                         LV ESV:       59 ml                         LV SV:        70 ml RIGHT VENTRICLE RV S prime:     9.03 cm/s TAPSE (M-mode): 2.0 cm LEFT ATRIUM             Index       RIGHT ATRIUM           Index LA diam:        3.80 cm 1.73 cm/m  RA Area:     10.90 cm LA Vol (A2C):   45.6 ml 20.76 ml/m RA Volume:   18.90 ml  8.60 ml/m LA Vol (A4C):   46.7 ml 21.26 ml/m LA Biplane Vol: 47.1 ml 21.44 ml/m  AORTIC VALVE LVOT Vmax:   115.00 cm/s LVOT Vmean:  87.500 cm/s LVOT VTI:    0.265 m  AORTA Ao Root diam: 3.70 cm Ao Asc diam:  3.40 cm MITRAL VALVE MV Area (PHT): 2.01 cm    SHUNTS MV Decel Time: 377 msec    Systemic VTI:  0.26 m MV E velocity: 72.40 cm/s  Systemic Diam: 2.30 cm MV A velocity: 81.80 cm/s MV E/A ratio:  0.89 Kirk Ruths MD Electronically signed by Kirk Ruths MD Signature Date/Time: 10/17/2020/12:12:14 PM    Final    VAS [place] LOWER EXTREMITY VENOUS (DVT)  Result Date: 10/19/2020  Lower Venous DVT Study Indications: Pulmonary embolism.  Comparison Study: 04/19/20 previous Performing Technologist: Abram Sander RVS  Examination Guidelines: A complete evaluation includes B-mode imaging, spectral Doppler, color Doppler, and power Doppler as needed of all accessible portions of each vessel. Bilateral testing is considered an integral part of a complete examination. Limited examinations for reoccurring indications may be performed as noted. The reflux portion of the exam is performed with the patient in reverse Trendelenburg.  +---------+---------------+---------+-----------+----------+--------------+ RIGHT     CompressibilityPhasicitySpontaneityPropertiesThrombus Aging +---------+---------------+---------+-----------+----------+--------------+ CFV      Full           Yes      Yes                                 +---------+---------------+---------+-----------+----------+--------------+  SFJ      Full                                                        +---------+---------------+---------+-----------+----------+--------------+ FV Prox  Full                                                        +---------+---------------+---------+-----------+----------+--------------+ FV Mid   Full                                                        +---------+---------------+---------+-----------+----------+--------------+ FV DistalFull                                                        +---------+---------------+---------+-----------+----------+--------------+ PFV      Full                                                        +---------+---------------+---------+-----------+----------+--------------+ POP      Full           Yes      Yes                                 +---------+---------------+---------+-----------+----------+--------------+ PTV      Full                                                        +---------+---------------+---------+-----------+----------+--------------+ PERO     Full                                                        +---------+---------------+---------+-----------+----------+--------------+   +---------+---------------+---------+-----------+----------+--------------+ LEFT     CompressibilityPhasicitySpontaneityPropertiesThrombus Aging +---------+---------------+---------+-----------+----------+--------------+ CFV      Full           Yes      Yes                                 +---------+---------------+---------+-----------+----------+--------------+ SFJ      Full                                                         +---------+---------------+---------+-----------+----------+--------------+  FV Prox  Full                                                        +---------+---------------+---------+-----------+----------+--------------+ FV Mid   Full                                                        +---------+---------------+---------+-----------+----------+--------------+ FV DistalFull                                                        +---------+---------------+---------+-----------+----------+--------------+ PFV      Full                                                        +---------+---------------+---------+-----------+----------+--------------+ POP      Full           Yes      Yes                                 +---------+---------------+---------+-----------+----------+--------------+ PTV      Full                                                        +---------+---------------+---------+-----------+----------+--------------+ PERO     Full                                                        +---------+---------------+---------+-----------+----------+--------------+     Summary: BILATERAL: - No evidence of deep vein thrombosis seen in the lower extremities, bilaterally. - No evidence of superficial venous thrombosis in the lower extremities, bilaterally. -No evidence of popliteal cyst, bilaterally.   *See table(s) above for measurements and observations. Electronically signed by Curt Jews MD on 10/19/2020 at 7:18:23 PM.    Final

## 2020-11-02 NOTE — H&P (Signed)
History and Physical    [name] HOZ:[id number] DOB: [birth year] DOA: 11/01/2020  PCP: Richard Blitz, MD  Patient coming from: Home.  Chief Complaint: Abdominal pain.  HPI: [facility] is a 75 y.o. male with history of hypertension recently admitted for non-ST elevation MI status post stent placement at that time patient also had pulmonary embolism was originally scheduled for surgery for diverticulitis this week since discharge on October 21, 2020 about 12 days ago after being admitted for non-ST relation MI started getting abdominal discomfort 3 days later after discharge mostly in the left lower quadrant with some nausea no vomiting or fever chills had some loose stools.  Patient's gastroenterologist at [facility] patient on oral antibiotics despite taking which patient's pain worsened had gone to the gastroenterologist yesterday at CT scan done which as per the patient with whom patient's gastroenterologist discussed with showed about 4 cc abscess around the sigmoid colon with inflammation was advised to come to the ER.  ED Course: In the ER patient is afebrile Covid test negative.  We are unable to retrieve patient's CAT scan results.  ER physician discussed with radiologist who recommended trying to retrieve the results in the morning and if not may need repeat CT scan.  Labs are significant for sodium of 129 hemoglobin 11.6 and WBC of 14.6.  Patient started on Zosyn admitted for further management of intra-abdominal abscess with sigmoid diverticulitis.  On exam patient has left lower quadrant tenderness.  Review of Systems: As per HPI, rest all negative.   Past Medical History:  Diagnosis Date  . Arthritis   . Asthma   . Coronary atherosclerosis of native coronary artery    a. DES x 3 RCA and DES mCx 10/11 - [facility] - with residual 50-70% stenosis in the distal Cx in AV groove, 70% distal LAD beyond apex, 70% small D1. b. 12/2017: similar results with multivessel CAD.  Patent stents. Medical management recommended.   . Diverticulitis   . GERD (gastroesophageal reflux disease)   . History of kidney stones   . Mixed hyperlipidemia   . Obstructive sleep apnea   . Pulmonary hypertension (HCC)    PASP 50 mmHg  . Restless leg syndrome   . Sarcoidosis   . Statin intolerance   . Ulcerative colitis [facility])     Past Surgical History:  Procedure Laterality Date  . Ankle fracture repair Left 1985  . CARDIAC CATHETERIZATION     stents x4  . CHOLECYSTECTOMY  1970  . CORONARY STENT INTERVENTION N/A 10/19/2020   Procedure: CORONARY STENT INTERVENTION;  Surgeon: Richard Sine, MD;  Location: [facility];  Service: Cardiovascular;  Laterality: N/A;  . CYSTOSCOPY WITH INSERTION OF UROLIFT N/A 08/27/2017   Procedure: CYSTOSCOPY WITH INSERTION OF UROLIFT;  Surgeon: Richard Gustin, MD;  Location: [facility];  Service: Urology;  Laterality: N/A;  . LAPAROSCOPIC APPENDECTOMY  2013  . LEFT HEART CATH AND CORONARY ANGIOGRAPHY N/A 01/02/2018   Procedure: LEFT HEART CATH AND CORONARY ANGIOGRAPHY;  Surgeon: Richard Sine, MD;  Location: [place] [place] [facility];  Service: Cardiovascular;  Laterality: N/A;  . LEFT HEART CATH AND CORONARY ANGIOGRAPHY N/A 10/19/2020   Procedure: LEFT HEART CATH AND CORONARY ANGIOGRAPHY;  Surgeon: Richard Sine, MD;  Location: [facility];  Service: Cardiovascular;  Laterality: N/A;  . Right total knee replacement Bilateral 2005     reports that he has never smoked. He has never used smokeless tobacco. He reports that he does  not drink alcohol and does not use drugs.  Allergies  Allergen Reactions  . Bee Venom Anaphylaxis  . Statins Other (See Comments)    Myalgias - Simvastatin and Rosuvastatin    Family History  Problem Relation Age of Onset  . Colon cancer Mother   . CAD Father     Prior to Admission medications   Medication Sig Start Date End Date Taking? Authorizing Provider  amLODipine (NORVASC) 10 MG tablet Take 10 mg  by mouth daily. 09/21/20   [provider]  apixaban (ELIQUIS) 5 MG TABS tablet Take 2 tablets twice daily until 3/7. On 3/8 start taking 1 tablet twice daily . 10/21/20   Richard Dee, MD  BREO ELLIPTA 200-25 MCG/INH AEPB Inhale 1 Inhaler into the lungs daily. 12/25/17   [provider]  carbidopa-levodopa (SINEMET IR) 10-100 MG per tablet Take 1 tablet by mouth every evening.    [provider]  clopidogrel (PLAVIX) 75 MG tablet Take 1 tablet (75 mg total) by mouth daily. 10/21/20   Richard Dee, MD  cyanocobalamin (,VITAMIN B-12,) 1000 MCG/ML injection Inject 1,000 mcg into the muscle every 30 (thirty) days. 08/10/20   [provider]  Dupilumab 300 MG/2ML SOPN Inject 300 mg into the skin every 14 (fourteen) days.    [provider]  Evolocumab (REPATHA SURECLICK) 604 MG/ML SOAJ Inject 140 mg into the skin every 14 (fourteen) days.    [provider]  famotidine (PEPCID) 20 MG tablet Take 1 tablet (20 mg total) by mouth every evening. 10/21/20 10/21/21  Richard Dee, MD  finasteride (PROSCAR) 5 MG tablet Take 1 tablet (5 mg total) by mouth daily. 09/22/20   McKenzie, Candee Furbish, MD  folic acid (FOLVITE) 1 MG tablet Take 1 mg by mouth See admin instructions. Mon - Fri    [provider]  hydrochlorothiazide (HYDRODIURIL) 25 MG tablet Take 25 mg by mouth daily.    [provider]  HYDROcodone-acetaminophen (NORCO/VICODIN) 5-325 MG tablet Take 1 tablet by mouth every 6 (six) hours as needed for severe pain. 08/06/20   [provider]  isosorbide mononitrate (IMDUR) 30 MG 24 hr tablet TAKE 1 TABLET BY MOUTH  DAILY Patient taking differently: Take 30 mg by mouth daily. 02/24/20   Satira Sark, MD  losartan (COZAAR) 50 MG tablet Take 50 mg by mouth daily. 08/03/20   [provider]  metoprolol tartrate (LOPRESSOR) 25 MG tablet Take 1 tablet (25 mg total) by mouth 2 (two) times daily. 10/21/20   Richard Dee, MD   montelukast (SINGULAIR) 10 MG tablet Take 10 mg by mouth at bedtime.    [provider]  nitroGLYCERIN (NITROSTAT) 0.4 MG SL tablet Place 1 tablet (0.4 mg total) under the tongue every 5 (five) minutes x 3 doses as needed for chest pain (if no relief after 3rd dose, proceed to the ED for an evaluation or call 911). 06/26/19   Satira Sark, MD  penicillin v potassium (VEETID) 500 MG tablet Take by mouth See admin instructions. TAKE 2 TABLETS BY MOUTH STAT THEN 1 TABLET FOUR TIMES DAILY FOR 7 DAYS 10/14/20   [provider]  potassium chloride (KLOR-CON) 10 MEQ tablet Take 1 tablet (10 mEq total) by mouth daily. 07/12/20 10/10/20  Satira Sark, MD  sulfaSALAzine (AZULFIDINE) 500 MG tablet Take 1,000 mg by mouth 2 (two) times daily.    [provider]  temazepam (RESTORIL) 30 MG capsule Take 30 mg by mouth at bedtime as needed  for sleep.     [provider]  Vitamin D, Ergocalciferol, (DRISDOL) 50000 UNITS CAPS capsule Take 50,000 Units by mouth every 7 (seven) days. Thursdays    [provider]    Physical Exam: Constitutional: Moderately built and nourished. Vitals:   11/02/20 0100 11/02/20 0130 11/02/20 0145 11/02/20 0200  BP: 112/70 115/67 125/75 101/71  Pulse: 64 67 65 65  Resp: 15 (!) 22 20 13   Temp:      SpO2: 99% 96% 97% 93%  Weight:      Height:       Eyes: Anicteric no pallor. ENMT: No discharge from the ears eyes nose or mouth. Neck: No mass felt.  No neck rigidity. Respiratory: No rhonchi or crepitations. Cardiovascular: S1-S2 heard. Abdomen: Tenderness in the left lower quadrant no guarding or rigidity. Musculoskeletal: No edema. Skin: No rash. Neurologic: Alert awake oriented to time place and person.  Moves all extremities. Psychiatric: Appears normal.  Normal affect.   Labs on Admission: I have personally reviewed following labs and imaging studies  CBC: Recent Labs  Lab 11/01/20 2118  WBC 14.6*  NEUTROABS  11.5*  HGB 11.6*  HCT 36.0*  MCV 85.9  PLT 470   Basic Metabolic Panel: Recent Labs  Lab 11/01/20 2118  NA 129*  K 3.9  CL 95*  CO2 24  GLUCOSE 113*  BUN 16  CREATININE 1.08  CALCIUM 9.4   GFR: Estimated Creatinine Clearance: 75.5 mL/min (by C-G formula based on SCr of 1.08 mg/dL). Liver Function Tests: Recent Labs  Lab 11/01/20 2118  AST 17  ALT 5  ALKPHOS 73  BILITOT 1.3*  PROT 7.5  ALBUMIN 3.6   Recent Labs  Lab 11/01/20 2118  LIPASE 36   No results for input(s): AMMONIA in the last 168 hours. Coagulation Profile: No results for input(s): INR, PROTIME in the last 168 hours. Cardiac Enzymes: No results for input(s): CKTOTAL, CKMB, CKMBINDEX, TROPONINI in the last 168 hours. BNP (last 3 results) No results for input(s): PROBNP in the last 8760 hours. HbA1C: No results for input(s): HGBA1C in the last 72 hours. CBG: No results for input(s): GLUCAP in the last 168 hours. Lipid Profile: No results for input(s): CHOL, HDL, LDLCALC, TRIG, CHOLHDL, LDLDIRECT in the last 72 hours. Thyroid Function Tests: No results for input(s): TSH, T4TOTAL, FREET4, T3FREE, THYROIDAB in the last 72 hours. Anemia Panel: No results for input(s): VITAMINB12, FOLATE, FERRITIN, TIBC, IRON, RETICCTPCT in the last 72 hours. Urine analysis:    Component Value Date/Time   COLORURINE YELLOW 11/02/2020 0015   APPEARANCEUR CLEAR 11/02/2020 0015   APPEARANCEUR Clear 09/22/2020 1718   LABSPEC 1.041 (H) 11/02/2020 0015   PHURINE 5.0 11/02/2020 0015   GLUCOSEU NEGATIVE 11/02/2020 0015   HGBUR NEGATIVE 11/02/2020 0015   BILIRUBINUR NEGATIVE 11/02/2020 0015   BILIRUBINUR Negative 09/22/2020 1718   KETONESUR NEGATIVE 11/02/2020 0015   PROTEINUR NEGATIVE 11/02/2020 0015   NITRITE NEGATIVE 11/02/2020 0015   LEUKOCYTESUR NEGATIVE 11/02/2020 0015   Sepsis Labs: @LABRCNTIP (procalcitonin:4,lacticidven:4) ) Recent Results (from the past 240 hour(s))  Resp Panel by RT-PCR (Flu A&B, Covid)  Nasopharyngeal Swab     Status: None   Collection Time: 11/02/20  1:59 AM   Specimen: Nasopharyngeal Swab; Nasopharyngeal(NP) swabs in vial transport medium  Result Value Ref Range Status   SARS Coronavirus 2 by RT PCR NEGATIVE NEGATIVE Final    Comment: (NOTE) SARS-CoV-2 target nucleic acids are NOT DETECTED.  The SARS-CoV-2 RNA is generally detectable in upper respiratory specimens  during the acute phase of infection. The lowest concentration of SARS-CoV-2 viral copies this assay can detect is 138 copies/mL. A negative result does not preclude SARS-Cov-2 infection and should not be used as the sole basis for treatment or other patient management decisions. A negative result may occur with  improper specimen collection/handling, submission of specimen other than nasopharyngeal swab, presence of viral mutation(s) within the areas targeted by this assay, and inadequate number of viral copies(<138 copies/mL). A negative result must be combined with clinical observations, patient history, and epidemiological information. The expected result is Negative.  Fact Sheet for Patients:  EntrepreneurPulse.com.au  Fact Sheet for Healthcare Providers:  IncredibleEmployment.be  This test is no t yet approved or cleared by the [place] FDA and  has been authorized for detection and/or diagnosis of SARS-CoV-2 by FDA under an Emergency Use Authorization (EUA). This EUA will remain  in effect (meaning this test can be used) for the duration of the COVID-19 declaration under Section 564(b)(1) of the Act, 21 U.S.C.section 360bbb-3(b)(1), unless the authorization is terminated  or revoked sooner.       Influenza A by PCR NEGATIVE NEGATIVE Final   Influenza B by PCR NEGATIVE NEGATIVE Final    Comment: (NOTE) The Xpert Xpress SARS-CoV-2/FLU/RSV plus assay is intended as an aid in the diagnosis of influenza from Nasopharyngeal swab specimens and should not be  used as a sole basis for treatment. Nasal washings and aspirates are unacceptable for Xpert Xpress SARS-CoV-2/FLU/RSV testing.  Fact Sheet for Patients: EntrepreneurPulse.com.au  Fact Sheet for Healthcare Providers: IncredibleEmployment.be  This test is not yet approved or cleared by the [place] FDA and has been authorized for detection and/or diagnosis of SARS-CoV-2 by FDA under an Emergency Use Authorization (EUA). This EUA will remain in effect (meaning this test can be used) for the duration of the COVID-19 declaration under Section 564(b)(1) of the Act, 21 U.S.C. section 360bbb-3(b)(1), unless the authorization is terminated or revoked.  Performed at [facility], [place] [address], [place], [place] [address]      Radiological Exams on Admission: No results found.   Assessment/Plan Principal Problem:   Intra-abdominal abscess (HCC) Active Problems:   BPH (benign prostatic hyperplasia)   Essential hypertension   Sarcoidosis   Pulmonary embolism ([place])    1. Diverticulitis with abscess -we are trying to retrieve the CAT scan results done yesterday from [facility].  At this time we will keep patient n.p.o. except medications and continue with Zosyn consult general surgery. 2. Recent non-ST elevation MI on Plavix, Repatha and beta-blockers.  Patient also takes Eliquis last dose was last evening 8 PM. 3. History of recent pulmonary embolism on Eliquis.  In anticipation of possible procedure we will keep patient on heparin. 4. Hypertension on beta-blockers, ARB, amlodipine.  Holding HCTZ due to hyponatremia. 5. Hyponatremia could be from dehydration and also HCTZ use.  Follow metabolic panel. 6. History of sarcoidosis. 7. Anemia appears to be chronic follow CBC.  Since patient has intra-abdominal abscess with diverticulitis with multiple other comorbidities will need close monitoring for any further worsening in inpatient  status.   DVT prophylaxis: Heparin infusion. Code Status: Full code. Family Communication: Discussed with patient. Disposition Plan: Home. Consults called: We will consult general surgery. Admission status: Inpatient.   Rise Patience MD [facility] Pager [phone].  If 7PM-7AM, please contact night-coverage [email or web address] Password [facility]  11/02/2020, 3:58 AM

## 2020-11-02 NOTE — Progress Notes (Signed)
ANTICOAGULATION CONSULT NOTE - Initial Consult  Pharmacy Consult for Heparin (Apixaban on hold) Indication: pulmonary embolus 10/17/20  Allergies  Allergen Reactions  . Bee Venom Anaphylaxis  . Statins Other (See Comments)    Myalgias - Simvastatin and Rosuvastatin    Patient Measurements: Height: 6' (182.9 cm) Weight: 90.6 kg (199 lb 11.8 oz) IBW/kg (Calculated) : 77.6  Vital Signs: Temp: 97.9 F (36.6 C) (03/15 0751) Temp Source: Oral (03/15 0751) BP: 112/67 (03/15 0751) Pulse Rate: 63 (03/15 0751)  Labs: Recent Labs    11/01/20 2118  HGB 11.6*  HCT 36.0*  PLT 376  CREATININE 1.08    Estimated Creatinine Clearance: 65.9 mL/min (by C-G formula based on SCr of 1.08 mg/dL).   Medical History: Past Medical History:  Diagnosis Date  . Arthritis   . Asthma   . Coronary atherosclerosis of native coronary artery    a. DES x 3 RCA and DES mCx 10/11 - [place] - with residual 50-70% stenosis in the distal Cx in AV groove, 70% distal LAD beyond apex, 70% small D1. b. 12/2017: similar results with multivessel CAD. Patent stents. Medical management recommended.   . Diverticulitis   . GERD (gastroesophageal reflux disease)   . History of kidney stones   . Mixed hyperlipidemia   . Obstructive sleep apnea   . Pulmonary hypertension (HCC)    PASP 50 mmHg  . Restless leg syndrome   . Sarcoidosis   . Statin intolerance   . Ulcerative colitis [facility])     Assessment: 75 y/o M presents to the ED with diverticulitis/abscess. On Apixaban PTA for small PE 10/17/20. Holding Apixaban and starting heparin in case procedures are needed, last dose 3/14 @ 20:00.   APTT 56 subtherapeutic, HL is supratherapeutic and still affected by apixaban. No reported bleeding.    Goal of Therapy:  Heparin level 0.3-0.7 units/ml aPTT 66-102 seconds Monitor platelets by anticoagulation protocol: Yes   Plan:  Increase heparin drip to 1500 units/hr F/u 8hr aPTT  F/u aPTT until correlates with  heparin level  Daily CBC/aPTT/heparin level Monitor for bleeding  Benetta Spar, PharmD, BCPS, BCCP Clinical Pharmacist  Please check AMION for all [place] phone numbers After 10:00 PM, call [place]

## 2020-11-02 NOTE — Consult Note (Addendum)
[facility] Surgery Consult Note  Richard Webb Oct 30, 1945  [id number].    Requesting MD: Lala Lund Chief Complaint/Reason for Consult: diverticulitis  HPI:  Richard Webb is a 75yo male HTN, HLD, OSA, GERD, Ulcerative colitis on sulfasalazine, Recent NSTEMI s/p stent placement 10/19/20 on plavix (last dose today AM) and Recent PE this month on eliquis (last dose 3/14 PM) who was admitted to [facility] yesterday with diverticulitis. States that he saw his gastroenterologist in the office yesterday who ordered a CT scan which showed diverticulitis with abscess and advised that he go to the ED. States that he has had several bouts of diverticulitis in the past, most recently 03/2020 which he required hospital admission for. This bout felt very similar. He reports noticing some lower abdominal discomfort about 2 weeks ago. The pain over the last few days became more severe. It is mostly suprapubic and LLQ and radiates into his penis. Denies n/v. Associated with decreased appetite and chills, no fevers. He also reports some loose, non-bloody stool which he states is sometimes his baseline due to UC.  Patient has seen Dr. Marcello Moores in our office before and was scheduled for elective surgery this month. Surgery was cancelled due to NSTEMI and he was advised to wait at least 6 months before surgery.  Patient was admitted to the medical service and started on IV zosyn. General surgery asked to see.  Abdominal surgical history: open cholecystectomy, laparoscopic appendectomy Last colonoscopy 01/2020 Nonsmoker Denies alcohol or illicit drug use Employment: pastor  Review of Systems  Constitutional: Positive for chills.  Respiratory: Negative.   Cardiovascular: Negative.   Gastrointestinal: Positive for abdominal pain and diarrhea. Negative for nausea and vomiting.  Genitourinary: Negative.   Musculoskeletal: Negative.    All systems reviewed and otherwise negative except for as above  Family  History  Problem Relation Age of Onset   Colon cancer Mother    CAD Father     Past Medical History:  Diagnosis Date   Arthritis    Asthma    Coronary atherosclerosis of native coronary artery    a. DES x 3 RCA and DES mCx 10/11 - [place] - with residual 50-70% stenosis in the distal Cx in AV groove, 70% distal LAD beyond apex, 70% small D1. b. 12/2017: similar results with multivessel CAD. Patent stents. Medical management recommended.    Diverticulitis    GERD (gastroesophageal reflux disease)    History of kidney stones    Mixed hyperlipidemia    Obstructive sleep apnea    Pulmonary hypertension (HCC)    PASP 50 mmHg   Restless leg syndrome    Sarcoidosis    Statin intolerance    Ulcerative colitis ([place])     Past Surgical History:  Procedure Laterality Date   Ankle fracture repair Left 1985   CARDIAC CATHETERIZATION     stents x4   CHOLECYSTECTOMY  1970   CORONARY STENT INTERVENTION N/A 10/19/2020   Procedure: CORONARY STENT INTERVENTION;  Surgeon: Troy Sine, MD;  Location: [facility] [facility];  Service: Cardiovascular;  Laterality: N/A;   CYSTOSCOPY WITH INSERTION OF UROLIFT N/A 08/27/2017   Procedure: CYSTOSCOPY WITH INSERTION OF UROLIFT;  Surgeon: Cleon Gustin, MD;  Location: [facility];  Service: Urology;  Laterality: N/A;   LAPAROSCOPIC APPENDECTOMY  2013   LEFT HEART CATH AND CORONARY ANGIOGRAPHY N/A 01/02/2018   Procedure: LEFT HEART CATH AND CORONARY ANGIOGRAPHY;  Surgeon: Troy Sine, MD;  Location: [place] [facility];  Service: Cardiovascular;  Laterality:  N/A;   LEFT HEART CATH AND CORONARY ANGIOGRAPHY N/A 10/19/2020   Procedure: LEFT HEART CATH AND CORONARY ANGIOGRAPHY;  Surgeon: Troy Sine, MD;  Location: [place] [facility];  Service: Cardiovascular;  Laterality: N/A;   Right total knee replacement Bilateral 2005    Social History:  reports that he has never smoked. He has never used smokeless tobacco. He reports that he  does not drink alcohol and does not use drugs.  Allergies:  Allergies  Allergen Reactions   Bee Venom Anaphylaxis   Statins Other (See Comments)    Myalgias - Simvastatin and Rosuvastatin    Medications Prior to Admission  Medication Sig Dispense Refill   amLODipine (NORVASC) 10 MG tablet Take 10 mg by mouth daily.     amoxicillin-clavulanate (AUGMENTIN) 875-125 MG tablet Take 1 tablet by mouth every 12 (twelve) hours.     apixaban (ELIQUIS) 5 MG TABS tablet Take 2 tablets twice daily until 3/7. On 3/8 start taking 1 tablet twice daily . 60 tablet 3   BREO ELLIPTA 200-25 MCG/INH AEPB Inhale 1 Inhaler into the lungs daily.  3   carbidopa-levodopa (SINEMET IR) 10-100 MG per tablet Take 1 tablet by mouth at bedtime.     clopidogrel (PLAVIX) 75 MG tablet Take 1 tablet (75 mg total) by mouth daily. 30 tablet 5   cyanocobalamin (,VITAMIN B-12,) 1000 MCG/ML injection Inject 1,000 mcg into the muscle every 30 (thirty) days.     Dupilumab 300 MG/2ML SOPN Inject 300 mg into the skin every 14 (fourteen) days.     Evolocumab (REPATHA SURECLICK) 646 MG/ML SOAJ Inject 140 mg into the skin every 14 (fourteen) days.     famotidine (PEPCID) 20 MG tablet Take 1 tablet (20 mg total) by mouth every evening. 30 tablet 3   finasteride (PROSCAR) 5 MG tablet Take 1 tablet (5 mg total) by mouth daily. 90 tablet 3   folic acid (FOLVITE) 1 MG tablet Take 1 mg by mouth See admin instructions. Mon - Fri     hydrochlorothiazide (HYDRODIURIL) 25 MG tablet Take 25 mg by mouth daily.     HYDROcodone-acetaminophen (NORCO/VICODIN) 5-325 MG tablet Take 1 tablet by mouth every 6 (six) hours as needed for severe pain.     isosorbide mononitrate (IMDUR) 30 MG 24 hr tablet TAKE 1 TABLET BY MOUTH  DAILY (Patient taking differently: Take 30 mg by mouth daily.) 90 tablet 3   LORazepam (ATIVAN) 0.5 MG tablet Take 0.5 mg by mouth daily as needed for anxiety.     losartan (COZAAR) 50 MG tablet Take 50 mg by mouth  daily.     metoprolol tartrate (LOPRESSOR) 25 MG tablet Take 1 tablet (25 mg total) by mouth 2 (two) times daily. 60 tablet 3   montelukast (SINGULAIR) 10 MG tablet Take 10 mg by mouth at bedtime.     nitroGLYCERIN (NITROSTAT) 0.4 MG SL tablet Place 1 tablet (0.4 mg total) under the tongue every 5 (five) minutes x 3 doses as needed for chest pain (if no relief after 3rd dose, proceed to the ED for an evaluation or call 911). 90 tablet 3   omeprazole (PRILOSEC) 20 MG capsule Take 20 mg by mouth daily.     oxymetazoline (AFRIN) 0.05 % nasal spray Place 1 spray into both nostrils 2 (two) times daily as needed for congestion.     penicillin v potassium (VEETID) 500 MG tablet Take by mouth See admin instructions. TAKE 2 TABLETS BY MOUTH STAT THEN 1 TABLET FOUR  TIMES DAILY FOR 7 DAYS     potassium chloride (KLOR-CON) 10 MEQ tablet Take 10 mEq by mouth daily.     sulfaSALAzine (AZULFIDINE) 500 MG tablet Take 1,000 mg by mouth 2 (two) times daily.     temazepam (RESTORIL) 30 MG capsule Take 30 mg by mouth at bedtime as needed for sleep.      Vitamin D, Ergocalciferol, (DRISDOL) 50000 UNITS CAPS capsule Take 50,000 Units by mouth every 7 (seven) days. Thursdays     amoxicillin (AMOXIL) 500 MG capsule Take 1,500 mg by mouth once as needed. Take before dental appointment     ezetimibe (ZETIA) 10 MG tablet Take 10 mg by mouth daily.      Prior to Admission medications   Medication Sig Start Date End Date Taking? Authorizing Provider  amLODipine (NORVASC) 10 MG tablet Take 10 mg by mouth daily. 09/21/20  Yes [provider]  amoxicillin-clavulanate (AUGMENTIN) 875-125 MG tablet Take 1 tablet by mouth every 12 (twelve) hours. 10/25/20 11/03/20 Yes [provider]  apixaban (ELIQUIS) 5 MG TABS tablet Take 2 tablets twice daily until 3/7. On 3/8 start taking 1 tablet twice daily . 10/21/20  Yes Dwyane Dee, MD  BREO ELLIPTA 200-25 MCG/INH AEPB Inhale 1 Inhaler into the lungs daily.  12/25/17  Yes [provider]  carbidopa-levodopa (SINEMET IR) 10-100 MG per tablet Take 1 tablet by mouth at bedtime.   Yes [provider]  clopidogrel (PLAVIX) 75 MG tablet Take 1 tablet (75 mg total) by mouth daily. 10/21/20  Yes Dwyane Dee, MD  cyanocobalamin (,VITAMIN B-12,) 1000 MCG/ML injection Inject 1,000 mcg into the muscle every 30 (thirty) days. 08/10/20  Yes [provider]  Dupilumab 300 MG/2ML SOPN Inject 300 mg into the skin every 14 (fourteen) days.   Yes [provider]  Evolocumab (REPATHA SURECLICK) 712 MG/ML SOAJ Inject 140 mg into the skin every 14 (fourteen) days.   Yes [provider]  famotidine (PEPCID) 20 MG tablet Take 1 tablet (20 mg total) by mouth every evening. 10/21/20 10/21/21 Yes Dwyane Dee, MD  finasteride (PROSCAR) 5 MG tablet Take 1 tablet (5 mg total) by mouth daily. 09/22/20  Yes McKenzie, Candee Furbish, MD  folic acid (FOLVITE) 1 MG tablet Take 1 mg by mouth See admin instructions. Mon - Fri   Yes [provider]  hydrochlorothiazide (HYDRODIURIL) 25 MG tablet Take 25 mg by mouth daily.   Yes [provider]  HYDROcodone-acetaminophen (NORCO/VICODIN) 5-325 MG tablet Take 1 tablet by mouth every 6 (six) hours as needed for severe pain. 08/06/20  Yes [provider]  isosorbide mononitrate (IMDUR) 30 MG 24 hr tablet TAKE 1 TABLET BY MOUTH  DAILY Patient taking differently: Take 30 mg by mouth daily. 02/24/20  Yes Satira Sark, MD  LORazepam (ATIVAN) 0.5 MG tablet Take 0.5 mg by mouth daily as needed for anxiety.   Yes [provider]  losartan (COZAAR) 50 MG tablet Take 50 mg by mouth daily. 08/03/20  Yes [provider]  metoprolol tartrate (LOPRESSOR) 25 MG tablet Take 1 tablet (25 mg total) by mouth 2 (two) times daily. 10/21/20  Yes Dwyane Dee, MD  montelukast (SINGULAIR) 10 MG tablet Take 10 mg by mouth at bedtime.   Yes [provider]  nitroGLYCERIN  (NITROSTAT) 0.4 MG SL tablet Place 1 tablet (0.4 mg total) under the tongue every 5 (five) minutes x 3 doses as needed for chest pain (if no relief after 3rd dose, proceed to  the ED for an evaluation or call 911). 06/26/19  Yes Satira Sark, MD  omeprazole (PRILOSEC) 20 MG capsule Take 20 mg by mouth daily.   Yes [provider]  oxymetazoline (AFRIN) 0.05 % nasal spray Place 1 spray into both nostrils 2 (two) times daily as needed for congestion.   Yes [provider]  penicillin v potassium (VEETID) 500 MG tablet Take by mouth See admin instructions. TAKE 2 TABLETS BY MOUTH STAT THEN 1 TABLET FOUR TIMES DAILY FOR 7 DAYS 10/14/20  Yes [provider]  potassium chloride (KLOR-CON) 10 MEQ tablet Take 10 mEq by mouth daily.   Yes [provider]  sulfaSALAzine (AZULFIDINE) 500 MG tablet Take 1,000 mg by mouth 2 (two) times daily.   Yes [provider]  temazepam (RESTORIL) 30 MG capsule Take 30 mg by mouth at bedtime as needed for sleep.    Yes [provider]  Vitamin D, Ergocalciferol, (DRISDOL) 50000 UNITS CAPS capsule Take 50,000 Units by mouth every 7 (seven) days. Thursdays   Yes [provider]  amoxicillin (AMOXIL) 500 MG capsule Take 1,500 mg by mouth once as needed. Take before dental appointment    [provider]  ezetimibe (ZETIA) 10 MG tablet Take 10 mg by mouth daily.    [provider]    Blood pressure 112/67, pulse 63, temperature 97.9 F (36.6 C), temperature source Oral, resp. rate 14, height 6' (1.829 m), weight 90.6 kg, SpO2 (!) 89 %. Physical Exam: General: pleasant, WD/WN male who is laying in bed in NAD HEENT: head is normocephalic, atraumatic.  Sclera are noninjected.  Pupils equal and round.  Ears and nose without any masses or lesions.  Mouth is pink and moist. Dentition fair Heart: regular, rate, and rhythm.  Normal s1,s2. No obvious murmurs, gallops, or rubs noted.  Palpable pedal pulses  bilaterally  Lungs: CTAB, no wheezes, rhonchi, or rales noted.  Respiratory effort nonlabored Abd: well healed open RUQ and laparoscopic incisions, soft, ND, focally TTP LLQ and suprapubic region with voluntary guarding, no peritonitis. +BS, no masses, hernias, or organomegaly MS: no BUE/BLE edema, calves soft and nontender Skin: warm and dry with no masses, lesions, or rashes Psych: A&Ox4 with an appropriate affect Neuro: cranial nerves grossly intact, equal strength in BUE/BLE bilaterally, normal speech, thought process intact  Results for orders placed or performed during the hospital encounter of 11/01/20 (from the past 48 hour(s))  CBC with Differential     Status: Abnormal   Collection Time: 11/01/20  9:18 PM  Result Value Ref Range   WBC 14.6 (H) 4.0 - 10.5 K/uL   RBC 4.19 (L) 4.22 - 5.81 MIL/uL   Hemoglobin 11.6 (L) 13.0 - 17.0 g/dL   HCT 36.0 (L) 39.0 - 52.0 %   MCV 85.9 80.0 - 100.0 fL   MCH 27.7 26.0 - 34.0 pg   MCHC 32.2 30.0 - 36.0 g/dL   RDW 15.0 11.5 - 15.5 %   Platelets 376 150 - 400 K/uL   nRBC 0.0 0.0 - 0.2 %   Neutrophils Relative % 78 %   Neutro Abs 11.5 (H) 1.7 - 7.7 K/uL   Lymphocytes Relative 12 %   Lymphs Abs 1.7 0.7 - 4.0 K/uL   Monocytes Relative 7 %   Monocytes Absolute 1.1 (H) 0.1 - 1.0 K/uL   Eosinophils Relative 2 %   Eosinophils Absolute 0.2 0.0 - 0.5 K/uL   Basophils Relative 0 %   Basophils Absolute 0.1 0.0 -  0.1 K/uL   Immature Granulocytes 1 %   Abs Immature Granulocytes 0.08 (H) 0.00 - 0.07 K/uL    Comment: Performed at [facility], [place] [address] [place], [place] [address]  Comprehensive metabolic panel     Status: Abnormal   Collection Time: 11/01/20  9:18 PM  Result Value Ref Range   Sodium 129 (L) 135 - 145 mmol/L   Potassium 3.9 3.5 - 5.1 mmol/L   Chloride 95 (L) 98 - 111 mmol/L   CO2 24 22 - 32 mmol/L   Glucose, Bld 113 (H) 70 - 99 mg/dL    Comment: Glucose reference range applies only to samples taken after fasting for at  least 8 hours.   BUN 16 8 - 23 mg/dL   Creatinine, Ser 1.08 0.61 - 1.24 mg/dL   Calcium 9.4 8.9 - 10.3 mg/dL   Total Protein 7.5 6.5 - 8.1 g/dL   Albumin 3.6 3.5 - 5.0 g/dL   AST 17 15 - 41 U/L   ALT 5 0 - 44 U/L   Alkaline Phosphatase 73 38 - 126 U/L   Total Bilirubin 1.3 (H) 0.3 - 1.2 mg/dL   GFR, Estimated >60 >60 mL/min    Comment: (NOTE) Calculated using the CKD-EPI Creatinine Equation (2021)    Anion gap 10 5 - 15    Comment: Performed at [facility] [address] [place], [place] [address]  Lipase, blood     Status: None   Collection Time: 11/01/20  9:18 PM  Result Value Ref Range   Lipase 36 11 - 51 U/L    Comment: Performed at [facility] [address], [place], [place] [address]  Urinalysis, Routine w reflex microscopic Urine, Clean Catch     Status: Abnormal   Collection Time: 11/02/20 12:15 AM  Result Value Ref Range   Color, Urine YELLOW YELLOW   APPearance CLEAR CLEAR   Specific Gravity, Urine 1.041 (H) 1.005 - 1.030   pH 5.0 5.0 - 8.0   Glucose, UA NEGATIVE NEGATIVE mg/dL   Hgb urine dipstick NEGATIVE NEGATIVE   Bilirubin Urine NEGATIVE NEGATIVE   Ketones, ur NEGATIVE NEGATIVE mg/dL   Protein, ur NEGATIVE NEGATIVE mg/dL   Nitrite NEGATIVE NEGATIVE   Leukocytes,Ua NEGATIVE NEGATIVE    Comment: Performed at [facility] [address] [place], [place] [address]  Resp Panel by RT-PCR (Flu A&B, Covid) Nasopharyngeal Swab     Status: None   Collection Time: 11/02/20  1:59 AM   Specimen: Nasopharyngeal Swab; Nasopharyngeal(NP) swabs in vial transport medium  Result Value Ref Range   SARS Coronavirus 2 by RT PCR NEGATIVE NEGATIVE    Comment: (NOTE) SARS-CoV-2 target nucleic acids are NOT DETECTED.  The SARS-CoV-2 RNA is generally detectable in upper respiratory specimens during the acute phase of infection. The lowest concentration of SARS-CoV-2 viral copies this assay can detect is 138 copies/mL. A negative result does not preclude  SARS-Cov-2 infection and should not be used as the sole basis for treatment or other patient management decisions. A negative result may occur with  improper specimen collection/handling, submission of specimen other than nasopharyngeal swab, presence of viral mutation(s) within the areas targeted by this assay, and inadequate number of viral copies(<138 copies/mL). A negative result must be combined with clinical observations, patient history, and epidemiological information. The expected result is Negative.  Fact Sheet for Patients:  EntrepreneurPulse.com.au  Fact Sheet for Healthcare Providers:  IncredibleEmployment.be  This test is no  t yet approved or cleared by the [place] and  has been authorized for detection and/or diagnosis of SARS-CoV-2 by FDA under an Emergency Use Authorization (EUA). This EUA will remain  in effect (meaning this test can be used) for the duration of the COVID-19 declaration under Section 564(b)(1) of the Act, 21 U.S.C.section 360bbb-3(b)(1), unless the authorization is terminated  or revoked sooner.       Influenza A by PCR NEGATIVE NEGATIVE   Influenza B by PCR NEGATIVE NEGATIVE    Comment: (NOTE) The Xpert Xpress SARS-CoV-2/FLU/RSV plus assay is intended as an aid in the diagnosis of influenza from Nasopharyngeal swab specimens and should not be used as a sole basis for treatment. Nasal washings and aspirates are unacceptable for Xpert Xpress SARS-CoV-2/FLU/RSV testing.  Fact Sheet for Patients: EntrepreneurPulse.com.au  Fact Sheet for Healthcare Providers: IncredibleEmployment.be  This test is not yet approved or cleared by the [place] FDA and has been authorized for detection and/or diagnosis of SARS-CoV-2 by FDA under an Emergency Use Authorization (EUA). This EUA will remain in effect (meaning this test can be used) for the duration of the COVID-19  declaration under Section 564(b)(1) of the Act, 21 U.S.C. section 360bbb-3(b)(1), unless the authorization is terminated or revoked.  Performed at [facility], [place] [address] [place], [place] [address]   Glucose, capillary     Status: Abnormal   Collection Time: 11/02/20  6:43 AM  Result Value Ref Range   Glucose-Capillary 104 (H) 70 - 99 mg/dL    Comment: Glucose reference range applies only to samples taken after fasting for at least 8 hours.  Osmolality     Status: None   Collection Time: 11/02/20  7:27 AM  Result Value Ref Range   Osmolality 280 275 - 295 mOsm/kg    Comment: Performed at [facility] [address] [place], [place] [address]  Type and screen     Status: None   Collection Time: 11/02/20  7:27 AM  Result Value Ref Range   ABO/RH(D) O NEG    Antibody Screen NEG    Sample Expiration      11/05/2020,2359 Performed at [facility], [place] [address] [place], [place] [address]   Urinalysis, Routine w reflex microscopic Urine, Clean Catch     Status: None   Collection Time: 11/02/20  9:09 AM  Result Value Ref Range   Color, Urine YELLOW YELLOW   APPearance CLEAR CLEAR   Specific Gravity, Urine 1.012 1.005 - 1.030   pH 6.0 5.0 - 8.0   Glucose, UA NEGATIVE NEGATIVE mg/dL   Hgb urine dipstick NEGATIVE NEGATIVE   Bilirubin Urine NEGATIVE NEGATIVE   Ketones, ur NEGATIVE NEGATIVE mg/dL   Protein, ur NEGATIVE NEGATIVE mg/dL   Nitrite NEGATIVE NEGATIVE   Leukocytes,Ua NEGATIVE NEGATIVE    Comment: Performed at [facility] [address], [place], [place] [address]   No results found.   Anti-infectives (From admission, onward)   Start     Dose/Rate Route Frequency Ordered Stop   11/02/20 1000  piperacillin-tazobactam (ZOSYN) IVPB 3.375 g        3.375 g 12.5 mL/hr over 240 Minutes Intravenous Every 8 hours 11/02/20 0412     11/02/20 0045  piperacillin-tazobactam (ZOSYN) IVPB 3.375 g        3.375 g 100 mL/hr over 30 Minutes Intravenous   Once 11/02/20 0044 11/02/20 0451       Assessment/Plan CAD, Recent NSTEMI s/p  stent placement 10/19/20 on plavix (last dose 11/02/20 in AM) Recent PE on eliquis (last dose 11/01/20 in PM) HTN HLD OSA GERD Hx Sarcoidosis Ulcerative colitis - followed by Dr. Richardson Landry Fine in [place], on sulfasalazine  Diverticulitis with abscess - prior h/o several bouts of diverticulitis, most recently 03/2020. Was scheduled for elective surgery with Dr. Marcello Moores this month but this was cancelled due to recent NSTEMI and PE and he was advised to postpone surgery at least 6 months  - I am unable to see the patient's imaging, he says someone is bringing a copy of the scan on a disk to the hospital soon. - No role for acute surgical intervention. For now agree with IV antibiotics and bowel rest. Will review scan with MD once it is here to see if percutaneous drainage would be of benefit. Please hold eliquis and plavix if possible in case a procedure is needed, ok for IV heparin.  ID - zosyn 3/15>> VTE - IV heparin FEN - IVF, NPO Foley - none Follow up - TBD   Wellington Hampshire, [facility] Surgery 11/02/2020, 9:50 AM Please see Amion for pager number during day hours 7:00am-4:30pm

## 2020-11-02 NOTE — Plan of Care (Signed)
  Problem: Education: Goal: Knowledge of General Education information will improve Description: Including pain rating scale, medication(s)/side effects and non-pharmacologic comfort measures Outcome: Progressing   Problem: Health Behavior/Discharge Planning: Goal: Ability to manage health-related needs will improve Outcome: Progressing   Problem: Clinical Measurements: Goal: Respiratory complications will improve Outcome: Progressing   Problem: Activity: Goal: Risk for activity intolerance will decrease Outcome: Progressing   Problem: Nutrition: Goal: Adequate nutrition will be maintained Outcome: Progressing   Problem: Coping: Goal: Level of anxiety will decrease Outcome: Progressing   Problem: Elimination: Goal: Will not experience complications related to urinary retention Outcome: Progressing   Problem: Pain Managment: Goal: General experience of comfort will improve Outcome: Progressing   Problem: Safety: Goal: Ability to remain free from injury will improve Outcome: Progressing   Problem: Skin Integrity: Goal: Risk for impaired skin integrity will decrease Outcome: Progressing

## 2020-11-02 NOTE — Progress Notes (Signed)
PT states he was okay the previous night without cpap he feels he will be good tonight.

## 2020-11-02 NOTE — Progress Notes (Signed)
ANTICOAGULATION CONSULT NOTE - Initial Consult  Pharmacy Consult for Heparin (Apixaban on hold) Indication: pulmonary embolus  Allergies  Allergen Reactions  . Bee Venom Anaphylaxis  . Statins Other (See Comments)    Myalgias - Simvastatin and Rosuvastatin    Patient Measurements: Height: 6' (182.9 cm) Weight: 106 kg (233 lb 11 oz) IBW/kg (Calculated) : 77.6  Vital Signs: Temp: 99 F (37.2 C) (03/14 2113) BP: 101/71 (03/15 0200) Pulse Rate: 65 (03/15 0200)  Labs: Recent Labs    11/01/20 2118  HGB 11.6*  HCT 36.0*  PLT 376  CREATININE 1.08    Estimated Creatinine Clearance: 75.5 mL/min (by C-G formula based on SCr of 1.08 mg/dL).   Medical History: Past Medical History:  Diagnosis Date  . Arthritis   . Asthma   . Coronary atherosclerosis of native coronary artery    a. DES x 3 RCA and DES mCx 10/11 - [place] - with residual 50-70% stenosis in the distal Cx in AV groove, 70% distal LAD beyond apex, 70% small D1. b. 12/2017: similar results with multivessel CAD. Patent stents. Medical management recommended.   . Diverticulitis   . GERD (gastroesophageal reflux disease)   . History of kidney stones   . Mixed hyperlipidemia   . Obstructive sleep apnea   . Pulmonary hypertension (HCC)    PASP 50 mmHg  . Restless leg syndrome   . Sarcoidosis   . Statin intolerance   . Ulcerative colitis [facility])     Assessment: 75 y/o M presents to the ED with diverticulitis/abscess. On Apixaban PTA for recent small PE. Holding Apixaban and starting heparin in case procedures are needed. It has been >12 hours since last Apixaban dose, will start heparin now. Anticipate using aPTT to dose for now. Hgb 11.6, renal function good.   Goal of Therapy:  Heparin level 0.3-0.7 units/ml aPTT 66-102 seconds Monitor platelets by anticoagulation protocol: Yes   Plan:  Start heparin drip at 1400 units/hr 1300 aPTT/heparin level Daily CBC/aPTT/heparin level Monitor for bleeding  Narda Bonds, PharmD, BCPS Clinical Pharmacist Phone: [phone]

## 2020-11-02 NOTE — Progress Notes (Signed)
ANTICOAGULATION CONSULT NOTE - [place] for IV Heparin (Apixaban on Hold) Indication: pulmonary embolus 10/17/20  Allergies  Allergen Reactions  . Bee Venom Anaphylaxis  . Statins Other (See Comments)    Myalgias - Simvastatin and Rosuvastatin    Patient Measurements: Height: 6' (182.9 cm) Weight: 90.6 kg (199 lb 11.8 oz) IBW/kg (Calculated) : 77.6  Heparin Dosing Weight:  90.6 kg  Vital Signs: Temp: 98.2 F (36.8 C) (03/15 2001) Temp Source: Oral (03/15 2001) BP: 111/60 (03/15 2001) Pulse Rate: 64 (03/15 2001)  Labs: Recent Labs    11/01/20 2118 11/02/20 1256 11/02/20 2105  HGB 11.6*  --   --   HCT 36.0*  --   --   PLT 376  --   --   APTT  --  56* 68*  HEPARINUNFRC  --  0.84*  --   CREATININE 1.08  --   --     Estimated Creatinine Clearance: 65.9 mL/min (by C-G formula based on SCr of 1.08 mg/dL).   Medical History: Past Medical History:  Diagnosis Date  . Arthritis   . Asthma   . Coronary atherosclerosis of native coronary artery    a. DES x 3 RCA and DES mCx 10/11 - [place] - with residual 50-70% stenosis in the distal Cx in AV groove, 70% distal LAD beyond apex, 70% small D1. b. 12/2017: similar results with multivessel CAD. Patent stents. Medical management recommended.   . Diverticulitis   . GERD (gastroesophageal reflux disease)   . History of kidney stones   . Mixed hyperlipidemia   . Obstructive sleep apnea   . Pulmonary hypertension (HCC)    PASP 50 mmHg  . Restless leg syndrome   . Sarcoidosis   . Statin intolerance   . Ulcerative colitis [facility])     Assessment: 75 yr old male presented to the ED with diverticulitis/abscess. Pt is on apixaban PTA for small PE 10/17/20 (last dose at 2000 PM on 3/14); apixaban currently on hold and starting IV heparin, in case procedures are needed. Given recent apixaban use, will monitor anticoagulation using aPTT until aPTT and heparin levels correlate.  aPTT ~7 hrs after heparin  infusion was increased to 1500 units/hr was 68 sec, which is at low end of the goal range for this pt. H/H 11.6/36.0, plt count 376. Per RN, no issues with IV or bleeding observed.  Goal of Therapy:  Heparin level 0.3-0.7 units/ml aPTT 66-102 seconds Monitor platelets by anticoagulation protocol: Yes   Plan:  Increase heparin infusion to 1550 units/hr Check aPTT, heparin level in 8 hrs Monitor daily aPTT, heparin level, CBC Monitor for bleeding  Gillermina Hu, PharmD, BCPS, [facility] Clinical Pharmacist

## 2020-11-03 LAB — URINALYSIS, ROUTINE W REFLEX MICROSCOPIC
Bilirubin Urine: NEGATIVE
Glucose, UA: NEGATIVE mg/dL
Ketones, ur: NEGATIVE mg/dL
Nitrite: NEGATIVE
Protein, ur: 30 mg/dL — AB
Specific Gravity, Urine: 1.009 (ref 1.005–1.030)
WBC, UA: >50 WBC/hpf — ABNORMAL HIGH (ref 0–5)
pH: 6 (ref 5.0–8.0)

## 2020-11-03 LAB — COMPREHENSIVE METABOLIC PANEL
ALT: 12 U/L (ref 0–44)
AST: 15 U/L (ref 15–41)
Albumin: 2.8 g/dL — ABNORMAL LOW (ref 3.5–5.0)
Alkaline Phosphatase: 66 U/L (ref 38–126)
Anion gap: 8 (ref 5–15)
BUN: 10 mg/dL (ref 8–23)
CO2: 25 mmol/L (ref 22–32)
Calcium: 8.7 mg/dL — ABNORMAL LOW (ref 8.9–10.3)
Chloride: 100 mmol/L (ref 98–111)
Creatinine, Ser: 1.05 mg/dL (ref 0.61–1.24)
GFR, Estimated: >60 mL/min (ref >60)
Glucose, Bld: 104 mg/dL — ABNORMAL HIGH (ref 70–99)
Potassium: 3.3 mmol/L — ABNORMAL LOW (ref 3.5–5.1)
Sodium: 133 mmol/L — ABNORMAL LOW (ref 135–145)
Total Bilirubin: 1 mg/dL (ref 0.3–1.2)
Total Protein: 6.2 g/dL — ABNORMAL LOW (ref 6.5–8.1)

## 2020-11-03 LAB — CBC WITH DIFFERENTIAL/PLATELET
Abs Immature Granulocytes: 0.04 10*3/uL (ref 0.00–0.07)
Basophils Absolute: 0 10*3/uL (ref 0.0–0.1)
Basophils Relative: 0 %
Eosinophils Absolute: 0.3 10*3/uL (ref 0.0–0.5)
Eosinophils Relative: 3 %
HCT: 32.8 % — ABNORMAL LOW (ref 39.0–52.0)
Hemoglobin: 10.7 g/dL — ABNORMAL LOW (ref 13.0–17.0)
Immature Granulocytes: 0 %
Lymphocytes Relative: 9 %
Lymphs Abs: 1 10*3/uL (ref 0.7–4.0)
MCH: 27.8 pg (ref 26.0–34.0)
MCHC: 32.6 g/dL (ref 30.0–36.0)
MCV: 85.2 fL (ref 80.0–100.0)
Monocytes Absolute: 0.9 10*3/uL (ref 0.1–1.0)
Monocytes Relative: 9 %
Neutro Abs: 8.4 10*3/uL — ABNORMAL HIGH (ref 1.7–7.7)
Neutrophils Relative %: 79 %
Platelets: 302 10*3/uL (ref 150–400)
RBC: 3.85 MIL/uL — ABNORMAL LOW (ref 4.22–5.81)
RDW: 14.8 % (ref 11.5–15.5)
WBC: 10.7 10*3/uL — ABNORMAL HIGH (ref 4.0–10.5)
nRBC: 0 % (ref 0.0–0.2)

## 2020-11-03 LAB — HEPARIN LEVEL (UNFRACTIONATED): Heparin Unfractionated: 0.64 IU/mL (ref 0.30–0.70)

## 2020-11-03 LAB — MAGNESIUM: Magnesium: 2.1 mg/dL (ref 1.7–2.4)

## 2020-11-03 LAB — PROCALCITONIN: Procalcitonin: <0.1 ng/mL

## 2020-11-03 LAB — APTT: aPTT: 67 seconds — ABNORMAL HIGH (ref 24–36)

## 2020-11-03 LAB — BRAIN NATRIURETIC PEPTIDE: B Natriuretic Peptide: 67.3 pg/mL (ref 0.0–100.0)

## 2020-11-03 LAB — GLUCOSE, CAPILLARY
Glucose-Capillary: 106 mg/dL — ABNORMAL HIGH (ref 70–99)
Glucose-Capillary: 98 mg/dL (ref 70–99)
Glucose-Capillary: 153 mg/dL — ABNORMAL HIGH (ref 70–99)
Glucose-Capillary: 144 mg/dL — ABNORMAL HIGH (ref 70–99)

## 2020-11-03 MED ORDER — POTASSIUM CHLORIDE CRYS ER 20 MEQ PO TBCR
40.0000 meq | EXTENDED_RELEASE_TABLET | Freq: Once | ORAL | Status: AC
  Administered 2020-11-03: 40 meq via ORAL
  Filled 2020-11-03: qty 2

## 2020-11-03 MED ORDER — SODIUM CHLORIDE 0.9 % IV SOLN: INTRAVENOUS | Status: AC

## 2020-11-03 MED ORDER — APIXABAN 5 MG PO TABS
5.0000 mg | ORAL_TABLET | Freq: Two times a day (BID) | ORAL | Status: DC
  Administered 2020-11-04 – 2020-11-05 (×3): 5 mg via ORAL
  Filled 2020-11-03 (×3): qty 1

## 2020-11-03 MED ORDER — ACETAMINOPHEN 325 MG PO TABS
650.0000 mg | ORAL_TABLET | Freq: Four times a day (QID) | ORAL | Status: DC | PRN
  Administered 2020-11-03 – 2020-11-04 (×3): 650 mg via ORAL
  Filled 2020-11-03 (×3): qty 2

## 2020-11-03 NOTE — Progress Notes (Signed)
PROGRESS NOTE                                                                                                                                                                                                             Patient Demographics:    Richard Webb, is a 75 y.o. male, DOB - 1945-11-27, BWI:[id number]  Outpatient Primary MD for the patient is Richard Blitz, MD    LOS - 1  Admit date - 11/01/2020    Chief Complaint  Patient presents with  . Diverticulitis w/Abscess       Brief Narrative (HPI from H&P) Richard Webb is a 75 y.o. male with history of hypertension, diverticulitis, BPH, hypertension, CAD, sarcoidosis, recent MI and PE, who was recently admitted for NSTEMI requiring drug-eluting stent placement to LAD March 1 week of 2022, he also had acute PE that admission requiring treatment with Eliquis.  Of note patient has had diverticulitis issues for a while he was originally scheduled for outpatient colon resection by Dr. Henri Webb later this month but this was canceled as he developed heart issues earlier this month.  He developed abdominal discomfort and initially presented to outlying hospital where he was diagnosed with diverticular abscess and transferred to [facility] for further care.   Subjective:   Patient in bed, appears comfortable, denies any headache, no fever, no chest pain or pressure, no shortness of breath , improved abdominal pain. No focal weakness.    Assessment  & Plan :     1. Abdominal pain due to diverticular abscess with colovesicle fistula  in a patient with history of diverticulitis who was originally scheduled for outpatient colon resection later this month, this procedure was canceled as he developed NSTEMI requiring stent placement on March 1 of 2020 - he is currently being treated conservatively with bowel rest, IV fluids and antibiotics.  CCS following, improving hence no  surgery or drain yet, monitor closely.  2.  History of NSTEMI with drug-eluting stent to LAD on October 19, 2020.  Continue Plavix, also takes Repatha shots every 2 weeks.  Cardiology following, Plavix to be continued for at least till 11/18/20 uninterrupted preferably 3 mths minimum from 10/19/20, if urgent surgery needed then bridge plavix with IV Aggrastat and Eliquis with IV Heparin .  3.  Recent subsegmental PE.  Placed on heparin drip  instead of Eliquis.  4.  Hypertension.  On combination of Norvasc, Imdur and beta-blocker.  Monitor.  5.  Dyslipidemia.  On Repatha shots twice a month.  6.  BPH.  On finasteride.  7.  GERD.  On Pepcid.  8. OSA - CPAP QHS.  9.  History of ulcerative colitis.  Supportive care.  10.  History of restless leg syndrome.  Continue combination of carbidopa levodopa.  11. Hypokalemia - replaced.      Condition - Extremely Guarded  Family Communication  : Richard Webb [phone] 11/02/20  Code Status :  Full  Consults  :   Cards, CCS  PUD Prophylaxis : pepcid   Procedures  :           Disposition Plan  :    Status is: Inpatient  Remains inpatient appropriate because:IV treatments appropriate due to intensity of illness or inability to take PO   Dispo: The patient is from: Home              Anticipated d/c is to: Home              Patient currently is not medically stable to d/c.   Difficult to place patient No   DVT Prophylaxis  :   Heparin gtt  Lab Results  Component Value Date   PLT 302 11/03/2020    Diet :  Diet Order            Diet full liquid Room service appropriate? Yes; Fluid consistency: Thin  Diet effective now                  Inpatient Medications  Scheduled Meds: . amLODipine  10 mg Oral Daily  . carbidopa-levodopa  1 tablet Oral QPM  . clopidogrel  75 mg Oral Daily  . [START ON 11/24/2020] cyanocobalamin  1,000 mcg Intramuscular Q30 days  . famotidine  40 mg Oral Daily  . finasteride  5 mg Oral Daily  .  fluticasone furoate-vilanterol  1 puff Inhalation Daily  . folic acid  1 mg Oral Once per day on Mon Tue Wed Thu Fri  . isosorbide mononitrate  30 mg Oral Daily  . metoprolol tartrate  25 mg Oral BID  . potassium chloride  40 mEq Oral Once   Continuous Infusions: . sodium chloride    . heparin 1,550 Units/hr (11/02/20 2232)  . piperacillin-tazobactam (ZOSYN)  IV 3.375 g (11/03/20 0600)   PRN Meds:.hydrALAZINE, morphine injection, nitroGLYCERIN, temazepam  Antibiotics  :    Anti-infectives (From admission, onward)   Start     Dose/Rate Route Frequency Ordered Stop   11/02/20 1000  piperacillin-tazobactam (ZOSYN) IVPB 3.375 g        3.375 g 12.5 mL/hr over 240 Minutes Intravenous Every 8 hours 11/02/20 0412     11/02/20 0045  piperacillin-tazobactam (ZOSYN) IVPB 3.375 g        3.375 g 100 mL/hr over 30 Minutes Intravenous  Once 11/02/20 0044 11/02/20 0451       Time Spent in minutes  30   Richard Webb M.D on 11/03/2020 at 10:42 AM  To page go to [email or web address]   [facility] -  Office  [phone]   See all Orders from today for further details    Objective:   Vitals:   11/02/20 1519 11/02/20 2001 11/03/20 0347 11/03/20 0825  BP: 108/64 111/60 117/66 122/73  Pulse: 62 64 66 63  Resp: 17 18 16 16   Temp: 98.9 F (37.2 C)  98.2 F (36.8 C) 99.2 F (37.3 C) 98.6 F (37 C)  TempSrc: Oral Oral Oral Oral  SpO2: 98% 96% 94% 98%  Weight:      Height:        Wt Readings from Last 3 Encounters:  11/02/20 90.6 kg  10/21/20 100.4 kg  09/22/20 97.5 kg     Intake/Output Summary (Last 24 hours) at 11/03/2020 1042 Last data filed at 11/03/2020 0500 Gross per 24 hour  Intake 436.75 ml  Output 4750 ml  Net -4313.25 ml     Physical Exam  Awake Alert, No new F.N deficits, Normal affect Richard Webb.AT,PERRAL Supple Neck,No JVD, No cervical lymphadenopathy appriciated.  Symmetrical Chest wall movement, Good air movement bilaterally, CTAB RRR,No Gallops, Rubs or new  Murmurs, No Parasternal Heave +ve B.Sounds, Abd Soft, improved abdominal tenderness, No organomegaly appriciated, No rebound - guarding or rigidity. No Cyanosis, Clubbing or edema, No new Rash or bruise       Data Review:    CBC Recent Labs  Lab 11/01/20 2118 11/03/20 0620  WBC 14.6* 10.7*  HGB 11.6* 10.7*  HCT 36.0* 32.8*  PLT 376 302  MCV 85.9 85.2  MCH 27.7 27.8  MCHC 32.2 32.6  RDW 15.0 14.8  LYMPHSABS 1.7 1.0  MONOABS 1.1* 0.9  EOSABS 0.2 0.3  BASOSABS 0.1 0.0    Recent Labs  Lab 11/01/20 2118 11/02/20 0727 11/03/20 0620  NA 129*  --  133*  K 3.9  --  3.3*  CL 95*  --  100  CO2 24  --  25  GLUCOSE 113*  --  104*  BUN 16  --  10  CREATININE 1.08  --  1.05  CALCIUM 9.4  --  8.7*  AST 17  --  15  ALT 5  --  12  ALKPHOS 73  --  66  BILITOT 1.3*  --  1.0  ALBUMIN 3.6  --  2.8*  MG  --   --  2.1  PROCALCITON  --  <0.10 <0.10  BNP  --   --  67.3    ------------------------------------------------------------------------------------------------------------------ No results for input(s): CHOL, HDL, LDLCALC, TRIG, CHOLHDL, LDLDIRECT in the last 72 hours.  Lab Results  Component Value Date   HGBA1C 6.2 (H) 10/16/2020   ------------------------------------------------------------------------------------------------------------------ No results for input(s): TSH, T4TOTAL, T3FREE, THYROIDAB in the last 72 hours.  Invalid input(s): FREET3  Cardiac Enzymes No results for input(s): CKMB, TROPONINI, MYOGLOBIN in the last 168 hours.  Invalid input(s): CK ------------------------------------------------------------------------------------------------------------------    Component Value Date/Time   BNP 67.3 11/03/2020 0620   BNP 10 10/18/2017 1648    Micro Results Recent Results (from the past 240 hour(s))  Resp Panel by RT-PCR (Flu A&B, Covid) Nasopharyngeal Swab     Status: None   Collection Time: 11/02/20  1:59 AM   Specimen: Nasopharyngeal Swab;  Nasopharyngeal(NP) swabs in vial transport medium  Result Value Ref Range Status   SARS Coronavirus 2 by RT PCR NEGATIVE NEGATIVE Final    Comment: (NOTE) SARS-CoV-2 target nucleic acids are NOT DETECTED.  The SARS-CoV-2 RNA is generally detectable in upper respiratory specimens during the acute phase of infection. The lowest concentration of SARS-CoV-2 viral copies this assay can detect is 138 copies/mL. A negative result does not preclude SARS-Cov-2 infection and should not be used as the sole basis for treatment or other patient management decisions. A negative result may occur with  improper specimen collection/handling, submission of specimen other than nasopharyngeal swab, presence of viral mutation(s)  within the areas targeted by this assay, and inadequate number of viral copies(<138 copies/mL). A negative result must be combined with clinical observations, patient history, and epidemiological information. The expected result is Negative.  Fact Sheet for Patients:  EntrepreneurPulse.com.au  Fact Sheet for Healthcare Providers:  IncredibleEmployment.be  This test is no t yet approved or cleared by the [place] FDA and  has been authorized for detection and/or diagnosis of SARS-CoV-2 by FDA under an Emergency Use Authorization (EUA). This EUA will remain  in effect (meaning this test can be used) for the duration of the COVID-19 declaration under Section 564(b)(1) of the Act, 21 U.S.C.section 360bbb-3(b)(1), unless the authorization is terminated  or revoked sooner.       Influenza A by PCR NEGATIVE NEGATIVE Final   Influenza B by PCR NEGATIVE NEGATIVE Final    Comment: (NOTE) The Xpert Xpress SARS-CoV-2/FLU/RSV plus assay is intended as an aid in the diagnosis of influenza from Nasopharyngeal swab specimens and should not be used as a sole basis for treatment. Nasal washings and aspirates are unacceptable for Xpert Xpress  SARS-CoV-2/FLU/RSV testing.  Fact Sheet for Patients: EntrepreneurPulse.com.au  Fact Sheet for Healthcare Providers: IncredibleEmployment.be  This test is not yet approved or cleared by the [place] FDA and has been authorized for detection and/or diagnosis of SARS-CoV-2 by FDA under an Emergency Use Authorization (EUA). This EUA will remain in effect (meaning this test can be used) for the duration of the COVID-19 declaration under Section 564(b)(1) of the Act, 21 U.S.C. section 360bbb-3(b)(1), unless the authorization is terminated or revoked.  Performed at [facility], [place] [address] [place], [place] [address]     Radiology Reports CT ANGIO CHEST PE W OR WO CONTRAST  Result Date: 10/17/2020 CLINICAL DATA:  Onset of chest pain and shortness of breath last night. Abnormal chest x-ray. History of sarcoidosis, asthma and hypertension. Clinical suspicion of pulmonary embolism. EXAM: CT ANGIOGRAPHY CHEST WITH CONTRAST TECHNIQUE: Multidetector CT imaging of the chest was performed using the standard protocol during bolus administration of intravenous contrast. Multiplanar CT image reconstructions and MIPs were obtained to evaluate the vascular anatomy. CONTRAST:  185m OMNIPAQUE IOHEXOL 350 MG/ML SOLN COMPARISON:  Chest radiographs 10/16/2020. Abdominal CT 02/15/2020 and 04/17/2020. FINDINGS: Cardiovascular: The pulmonary arteries are well opacified with contrast to the level of the subsegmental branches. There is a single occluded subsegmental branch in the superior segment of the left lower lobe (images 28 through 34 of series 4), consistent with pulmonary embolism. The pulmonary arteries are otherwise patent, and there are no central pulmonary emboli. There is central enlargement of the pulmonary arteries consistent with pulmonary arterial hypertension. There is moderate atherosclerosis of the aorta, great vessels and coronary arteries. The  heart size is normal. There is no pericardial effusion. Mediastinum/Nodes: There are no enlarged mediastinal, hilar or axillary lymph nodes.There are scattered small partially calcified mediastinal and hilar lymph nodes bilaterally. The thyroid gland, trachea and esophagus demonstrate no significant findings. Lungs/Pleura: There is no pleural effusion or pneumothorax. There is extensive upper lobe predominant chronic lung disease with thickening of the bronchovascular bundles, peribronchovascular scarring and traction bronchiectasis, compatible with chronic sarcoidosis. The basilar components have progressed compared with the prior abdominal CT. Scattered mucous plugging is noted. There is no confluent airspace opacity or suspicious pulmonary nodule. Upper abdomen: Nodular parents of the liver again noted suspicious for cirrhosis. No adrenal mass. Musculoskeletal/Chest wall: There is no chest wall mass or suspicious osseous finding. Review of the MIP  images confirms the above findings. IMPRESSION: 1. Single small focus of pulmonary embolism involving a subsegmental branch of the superior segment of the left lower lobe. This is of questionable significance regarding the patient's symptoms. No central pulmonary emboli. Consider lower extremity venous doppler ultrasound. 2. Central enlargement of the pulmonary arteries consistent with pulmonary arterial hypertension. 3. 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 The basilar components have progressed compared with the prior abdominal CT and superimposed inflammation/infection is not completely excluded. No evidence of neoplasm. 4. Aortic Atherosclerosis (ICD10-I70.0). 5. Critical Value/emergent results were called by telephone at the time of interpretation on 10/17/2020 at 4:16 pm to provider Aletta Edouard, who verbally acknowledged these results. Electronically Signed   By: Richardean Sale M.D.   On: 10/17/2020 16:17   CARDIAC  CATHETERIZATION  Result Date: 10/19/2020  Prox LAD lesion is 90% stenosed.  Dist LM lesion is 30% stenosed.  2nd Diag lesion is 70% stenosed.  Mid LAD lesion is 50% stenosed.  Dist LAD-1 lesion is 90% stenosed.  Dist LAD-2 lesion is 80% stenosed.  Post intervention, there is a 0% residual stenosis.  2nd Mrg lesion is 50% stenosed.  Mid Cx to Dist Cx lesion is 70% stenosed.  Previously placed Prox Cx to Mid Cx stent (unknown type) is widely patent.  Ost Cx to Prox Cx lesion is 20% stenosed.  Previously placed Ost RCA to Prox RCA stent (unknown type) is widely patent.  Previously placed Dist RCA stent (unknown type) is widely patent.  Prox RCA to Mid RCA lesion is 50% stenosed.  Mid RCA-1 lesion is 50% stenosed.  Previously placed Mid RCA-2 stent (unknown type) is widely patent.  RV Branch lesion is 95% stenosed.  RPDA lesion is 70% stenosed.  RPAV lesion is 50% stenosed.  A stent was successfully placed.  Significant three-vessel CAD with previously placed stents in the circumflex, and RCA which remain patent. The LAD has 30% smooth ostial narrowing followed by 90% proximal LAD stenosis before the initial septal perforating artery.  The mid LAD and diagonal vessel is previously noted 70 and 50% stenoses.  There is diffuse distal apical LAD disease of 90 and 80%. The circumflex stent is widely patent.  There is mild proximal 20% narrowing.  There is mid AV groove circumflex stenoses of 70% with 50% OM 2 stenosis. The RCA has patent stents at the ostium, proximal to the acute margin, and prior to the PDA vessel.  There is previously noted 50% stenoses slightly improved from previously in the mid RCA with old 95% ostial stenosis and a small marginal branch.  The PDA is small and has ostial 70% stenosis and there is 50% continuation branch stenosis in the distal RCA. Successful PCI with DES stenting of the proximal LAD with ultimate insertion of a 4.0 x 15 mm Resolute Onyx stent postdilated with  stent taper from 4.45 to 4.20 mm with residual narrowing 0% and brisk TIMI-3 flow. LVEDP 13 mm. RECOMMENDATION: DAPT therapy ideally for greater than 1 year.  The patient will have to defer his planned upcoming surgery and possibly this can be done after 6 months if he remains stable.  Recommend increase medical therapy for concomitant CAD with increase nitrates, beta-blocker therapy, consider initiation of ranolazine.  Continue aggressive lipid-lowering therapy with PCSK9 inhibition.   DG Chest Port 1 View  Result Date: 10/16/2020 CLINICAL DATA:  Chest pain. EXAM: PORTABLE CHEST 1 VIEW COMPARISON:  None. FINDINGS: The heart size and mediastinal contours are  within normal limits. No pneumothorax or pleural effusion is noted. Bilateral upper lobe opacities are noted which may represent pneumonia or possibly edema, but neoplasm cannot be excluded. The visualized skeletal structures are unremarkable. IMPRESSION: Bilateral upper lobe opacities are noted which may represent pneumonia or possibly edema, but neoplasm cannot be excluded. CT scan of the chest is recommended for further evaluation. Electronically Signed   By: Marijo Conception M.D.   On: 10/16/2020 22:45   ECHOCARDIOGRAM COMPLETE  Result Date: 10/17/2020    ECHOCARDIOGRAM REPORT   Patient Name:   Richard Webb Date of Exam: 10/17/2020 Medical Rec #:  [id number]        Height:       72.0 in Accession #:    [id number]       Weight:       214.9 lb Date of Birth:  [birth year]        BSA:          2.197 m Patient Age:    89 years         BP:           119/72 mmHg Patient Gender: M                HR:           60 bpm. Exam Location:  Inpatient Procedure: 2D Echo, Cardiac Doppler and Color Doppler Indications:    Dyspnea R06.00  History:        Patient has no prior history of Echocardiogram examinations and                 Patient has prior history of Echocardiogram examinations, most                 recent 08/10/2020. CAD, Pulmonary HTN; Risk  Factors:Dyslipidemia                 and Sleep Apnea. Sarcoidosis. GERD. Asthma.  Sonographer:    Darlina Sicilian RDCS Referring Phys: [place]  1. Left ventricular ejection fraction, by estimation, is 70 to 75%. The left ventricle has hyperdynamic function. The left ventricle has no regional wall motion abnormalities. There is mild left ventricular hypertrophy of the basal-septal segment. Left ventricular diastolic parameters are consistent with Grade I diastolic dysfunction (impaired relaxation).  2. Right ventricular systolic function is normal. The right ventricular size is normal.  3. The mitral valve is normal in structure. Trivial mitral valve regurgitation. No evidence of mitral stenosis.  4. The aortic valve is tricuspid. Aortic valve regurgitation is not visualized. Mild aortic valve sclerosis is present, with no evidence of aortic valve stenosis. FINDINGS  Left Ventricle: Left ventricular ejection fraction, by estimation, is 70 to 75%. The left ventricle has hyperdynamic function. The left ventricle has no regional wall motion abnormalities. The left ventricular internal cavity size was normal in size. There is mild left ventricular hypertrophy of the basal-septal segment. Left ventricular diastolic parameters are consistent with Grade I diastolic dysfunction (impaired relaxation). Right Ventricle: The right ventricular size is normal. Right ventricular systolic function is normal. Left Atrium: Left atrial size was normal in size. Right Atrium: Right atrial size was normal in size. Pericardium: There is no evidence of pericardial effusion. Mitral Valve: The mitral valve is normal in structure. Mild mitral annular calcification. Trivial mitral valve regurgitation. No evidence of mitral valve stenosis. Tricuspid Valve: The tricuspid valve is normal in structure. Tricuspid valve regurgitation is trivial. No evidence of tricuspid stenosis.  Aortic Valve: The aortic valve is tricuspid.  Aortic valve regurgitation is not visualized. Mild aortic valve sclerosis is present, with no evidence of aortic valve stenosis. Pulmonic Valve: The pulmonic valve was normal in structure. Pulmonic valve regurgitation is trivial. No evidence of pulmonic stenosis. Aorta: The aortic root is normal in size and structure. Venous: The inferior vena cava was not well visualized. IAS/Shunts: The interatrial septum was not well visualized.  LEFT VENTRICLE PLAX 2D LVIDd:         4.44 cm  Diastology LVIDs:         2.35 cm  LV e' medial:    5.33 cm/s LV PW:         0.89 cm  LV E/e' medial:  13.6 LV IVS:        1.38 cm  LV e' lateral:   5.44 cm/s LVOT diam:     2.30 cm  LV E/e' lateral: 13.3 LV SV:         110 LV SV Index:   50 LVOT Area:     4.15 cm                          3D Volume EF:                         3D EF:        54 %                         LV EDV:       130 ml                         LV ESV:       59 ml                         LV SV:        70 ml RIGHT VENTRICLE RV S prime:     9.03 cm/s TAPSE (M-mode): 2.0 cm LEFT ATRIUM             Index       RIGHT ATRIUM           Index LA diam:        3.80 cm 1.73 cm/m  RA Area:     10.90 cm LA Vol (A2C):   45.6 ml 20.76 ml/m RA Volume:   18.90 ml  8.60 ml/m LA Vol (A4C):   46.7 ml 21.26 ml/m LA Biplane Vol: 47.1 ml 21.44 ml/m  AORTIC VALVE LVOT Vmax:   115.00 cm/s LVOT Vmean:  87.500 cm/s LVOT VTI:    0.265 m  AORTA Ao Root diam: 3.70 cm Ao Asc diam:  3.40 cm MITRAL VALVE MV Area (PHT): 2.01 cm    SHUNTS MV Decel Time: 377 msec    Systemic VTI:  0.26 m MV E velocity: 72.40 cm/s  Systemic Diam: 2.30 cm MV A velocity: 81.80 cm/s MV E/A ratio:  0.89 Kirk Ruths MD Electronically signed by Kirk Ruths MD Signature Date/Time: 10/17/2020/12:12:14 PM    Final    VAS [place] LOWER EXTREMITY VENOUS (DVT)  Result Date: 10/19/2020  Lower Venous DVT Study Indications: Pulmonary embolism.  Comparison Study: 04/19/20 previous Performing Technologist: Abram Sander RVS   Examination Guidelines: A complete evaluation includes B-mode imaging, spectral Doppler, color Doppler, and power Doppler  as needed of all accessible portions of each vessel. Bilateral testing is considered an integral part of a complete examination. Limited examinations for reoccurring indications may be performed as noted. The reflux portion of the exam is performed with the patient in reverse Trendelenburg.  +---------+---------------+---------+-----------+----------+--------------+ RIGHT    CompressibilityPhasicitySpontaneityPropertiesThrombus Aging +---------+---------------+---------+-----------+----------+--------------+ CFV      Full           Yes      Yes                                 +---------+---------------+---------+-----------+----------+--------------+ SFJ      Full                                                        +---------+---------------+---------+-----------+----------+--------------+ FV Prox  Full                                                        +---------+---------------+---------+-----------+----------+--------------+ FV Mid   Full                                                        +---------+---------------+---------+-----------+----------+--------------+ FV DistalFull                                                        +---------+---------------+---------+-----------+----------+--------------+ PFV      Full                                                        +---------+---------------+---------+-----------+----------+--------------+ POP      Full           Yes      Yes                                 +---------+---------------+---------+-----------+----------+--------------+ PTV      Full                                                        +---------+---------------+---------+-----------+----------+--------------+ PERO     Full                                                         +---------+---------------+---------+-----------+----------+--------------+   +---------+---------------+---------+-----------+----------+--------------+  LEFT     CompressibilityPhasicitySpontaneityPropertiesThrombus Aging +---------+---------------+---------+-----------+----------+--------------+ CFV      Full           Yes      Yes                                 +---------+---------------+---------+-----------+----------+--------------+ SFJ      Full                                                        +---------+---------------+---------+-----------+----------+--------------+ FV Prox  Full                                                        +---------+---------------+---------+-----------+----------+--------------+ FV Mid   Full                                                        +---------+---------------+---------+-----------+----------+--------------+ FV DistalFull                                                        +---------+---------------+---------+-----------+----------+--------------+ PFV      Full                                                        +---------+---------------+---------+-----------+----------+--------------+ POP      Full           Yes      Yes                                 +---------+---------------+---------+-----------+----------+--------------+ PTV      Full                                                        +---------+---------------+---------+-----------+----------+--------------+ PERO     Full                                                        +---------+---------------+---------+-----------+----------+--------------+     Summary: BILATERAL: - No evidence of deep vein thrombosis seen in the lower extremities, bilaterally. - No evidence of superficial venous thrombosis in the lower extremities, bilaterally. -No evidence of popliteal cyst, bilaterally.   *See table(s) above for measurements  and observations. Electronically  signed by Curt Jews MD on 10/19/2020 at 7:18:23 PM.    Final

## 2020-11-03 NOTE — Progress Notes (Signed)
ANTICOAGULATION CONSULT NOTE - [place] for IV Heparin (Apixaban on Hold) Indication: pulmonary embolus 10/17/20  Allergies  Allergen Reactions  . Bee Venom Anaphylaxis  . Statins Other (See Comments)    Myalgias - Simvastatin and Rosuvastatin    Patient Measurements: Height: 6' (182.9 cm) Weight: 90.6 kg (199 lb 11.8 oz) IBW/kg (Calculated) : 77.6  Heparin Dosing Weight:  90.6 kg  Vital Signs: Temp: 99.2 F (37.3 C) (03/16 0347) Temp Source: Oral (03/16 0347) BP: 117/66 (03/16 0347) Pulse Rate: 66 (03/16 0347)  Labs: Recent Labs    11/01/20 2118 11/02/20 1256 11/02/20 2105 11/03/20 0620  HGB 11.6*  --   --  10.7*  HCT 36.0*  --   --  32.8*  PLT 376  --   --  302  APTT  --  56* 68* 67*  HEPARINUNFRC  --  0.84*  --  0.64  CREATININE 1.08  --   --  1.05    Estimated Creatinine Clearance: 67.7 mL/min (by C-G formula based on SCr of 1.05 mg/dL).   Medical History: Past Medical History:  Diagnosis Date  . Arthritis   . Asthma   . Coronary atherosclerosis of native coronary artery    a. DES x 3 RCA and DES mCx 10/11 - [place] - with residual 50-70% stenosis in the distal Cx in AV groove, 70% distal LAD beyond apex, 70% small D1. b. 12/2017: similar results with multivessel CAD. Patent stents. Medical management recommended.   . Diverticulitis   . GERD (gastroesophageal reflux disease)   . History of kidney stones   . Mixed hyperlipidemia   . Obstructive sleep apnea   . Pulmonary hypertension (HCC)    PASP 50 mmHg  . Restless leg syndrome   . Sarcoidosis   . Statin intolerance   . Ulcerative colitis [facility])     Assessment: 75 yr old male presented to the ED with diverticulitis/abscess. Pt is on apixaban PTA for small PE 10/17/20 (last dose at 2000 PM on 3/14); apixaban currently on hold and starting IV heparin, in case procedures are needed. Given recent apixaban use, will monitor anticoagulation using aPTT until aPTT and heparin levels  correlate.  No plans for surgery noted but oral anticoagulation on hold in case of procedures  -aPTT= 67 -hg= 10.7   Goal of Therapy:  Heparin level 0.3-0.7 units/ml aPTT 66-102 seconds Monitor platelets by anticoagulation protocol: Yes   Plan:  Continue heparin at 1550 units/hr Monitor daily aPTT, heparin level, CBC  Hildred Laser, PharmD Clinical Pharmacist **Pharmacist phone directory can now be found on [email or web address] ([email or web address]).  Listed under [place].

## 2020-11-03 NOTE — Progress Notes (Signed)
[facility] Progress Note     Subjective: CC-  Up in chair. Overall feeling better than yesterday. Abdominal pain improving. Denies n/v. Tolerating clear liquids. States that he noticed air with urination this morning.  WBC down 10.7 from 14.6, afebrile, VSS.  Objective: Vital signs in last 24 hours: Temp:  [98.2 F (36.8 C)-99.2 F (37.3 C)] 98.6 F (37 C) (03/16 0825) Pulse Rate:  [62-66] 63 (03/16 0825) Resp:  [16-18] 16 (03/16 0825) BP: (108-122)/(60-73) 122/73 (03/16 0825) SpO2:  [94 %-98 %] 98 % (03/16 0825) Last BM Date: 11/01/20  Intake/Output from previous day: 03/15 0701 - 03/16 0700 In: 436.8 [I.V.:386.8; IV Piggyback:50] Out: 5025 [Urine:5025] Intake/Output this shift: No intake/output data recorded.  PE: Gen:  Alert, NAD, pleasant HEENT: EOM's intact, pupils equal and round Card:  RRR Pulm:  CTAB, no W/R/R, rate and effort normal Abd: well healed open RUQ and laparoscopic incisions, soft, ND, mild subjective suprapubic TTP without rebound or guarding. +BS, no masses, hernias, or organomegaly Psych: A&Ox4  Skin: no rashes noted, warm and dry  Lab Results:  Recent Labs    11/01/20 2118 11/03/20 0620  WBC 14.6* 10.7*  HGB 11.6* 10.7*  HCT 36.0* 32.8*  PLT 376 302   BMET Recent Labs    11/01/20 2118 11/03/20 0620  NA 129* 133*  K 3.9 3.3*  CL 95* 100  CO2 24 25  GLUCOSE 113* 104*  BUN 16 10  CREATININE 1.08 1.05  CALCIUM 9.4 8.7*   PT/INR No results for input(s): LABPROT, INR in the last 72 hours. CMP     Component Value Date/Time   NA 133 (L) 11/03/2020 0620   NA 136 03/18/2020 1129   K 3.3 (L) 11/03/2020 0620   CL 100 11/03/2020 0620   CO2 25 11/03/2020 0620   GLUCOSE 104 (H) 11/03/2020 0620   BUN 10 11/03/2020 0620   BUN 21 03/18/2020 1129   CREATININE 1.05 11/03/2020 0620   CREATININE 0.86 10/18/2017 1648   CALCIUM 8.7 (L) 11/03/2020 0620   PROT 6.2 (L) 11/03/2020 0620   ALBUMIN 2.8 (L) 11/03/2020 0620   AST 15  11/03/2020 0620   ALT 12 11/03/2020 0620   ALKPHOS 66 11/03/2020 0620   BILITOT 1.0 11/03/2020 0620   GFRNONAA >60 11/03/2020 0620   GFRAA >60 04/20/2020 0219   Lipase     Component Value Date/Time   LIPASE 36 11/01/2020 2118       Studies/Results: No results found.  Anti-infectives: Anti-infectives (From admission, onward)   Start     Dose/Rate Route Frequency Ordered Stop   11/02/20 1000  piperacillin-tazobactam (ZOSYN) IVPB 3.375 g        3.375 g 12.5 mL/hr over 240 Minutes Intravenous Every 8 hours 11/02/20 0412     11/02/20 0045  piperacillin-tazobactam (ZOSYN) IVPB 3.375 g        3.375 g 100 mL/hr over 30 Minutes Intravenous  Once 11/02/20 0044 11/02/20 0451       Assessment/Plan CAD, Recent NSTEMI s/p stent placement 10/19/20 on plavix Recent PE on eliquis (last dose 11/01/20 in PM) - currently on IV heparin HTN HLD OSA GERD Hx Sarcoidosis Ulcerative colitis - followed by Dr. Richardson Landry Fine in [place], on sulfasalazine  Sigmoid diverticulitis with abscess Pneumaturia - prior h/o several bouts of diverticulitis, most recently 03/2020. Was scheduled for elective surgery with Dr. Marcello Moores this month but this was cancelled due to recent NSTEMI and PE and he was advised to postpone surgery at  least 6 months  - no role for acute surgical intervention or IR perc drain - Clinically improving on IV zosyn. Will advance to full liquids. Continue IV antibiotics. With new onset pneumaturia will check u/a and Ucx. Likely would benefit from low dose suppressant antibiotics until surgery.   ID - zosyn 3/15>> VTE - IV heparin FEN - IVF, FLD Foley - none Follow up - Dr. Marcello Moores   LOS: 1 day    Wellington [place], [facility] 11/03/2020, 9:53 AM Please see Amion for pager number during day hours 7:00am-4:30pm

## 2020-11-04 ENCOUNTER — Ambulatory Visit: Payer: Medicare Other | Admitting: Family Medicine

## 2020-11-04 LAB — COMPREHENSIVE METABOLIC PANEL
ALT: 9 U/L (ref 0–44)
AST: 12 U/L — ABNORMAL LOW (ref 15–41)
Albumin: 2.8 g/dL — ABNORMAL LOW (ref 3.5–5.0)
Alkaline Phosphatase: 64 U/L (ref 38–126)
Anion gap: 8 (ref 5–15)
BUN: 7 mg/dL — ABNORMAL LOW (ref 8–23)
CO2: 24 mmol/L (ref 22–32)
Calcium: 8.7 mg/dL — ABNORMAL LOW (ref 8.9–10.3)
Chloride: 101 mmol/L (ref 98–111)
Creatinine, Ser: 0.96 mg/dL (ref 0.61–1.24)
GFR, Estimated: >60 mL/min (ref >60)
Glucose, Bld: 101 mg/dL — ABNORMAL HIGH (ref 70–99)
Potassium: 3.4 mmol/L — ABNORMAL LOW (ref 3.5–5.1)
Sodium: 133 mmol/L — ABNORMAL LOW (ref 135–145)
Total Bilirubin: 0.9 mg/dL (ref 0.3–1.2)
Total Protein: 6.1 g/dL — ABNORMAL LOW (ref 6.5–8.1)

## 2020-11-04 LAB — CBC WITH DIFFERENTIAL/PLATELET
Abs Immature Granulocytes: 0.06 10*3/uL (ref 0.00–0.07)
Basophils Absolute: 0 10*3/uL (ref 0.0–0.1)
Basophils Relative: 0 %
Eosinophils Absolute: 0.3 10*3/uL (ref 0.0–0.5)
Eosinophils Relative: 3 %
HCT: 31.4 % — ABNORMAL LOW (ref 39.0–52.0)
Hemoglobin: 10.6 g/dL — ABNORMAL LOW (ref 13.0–17.0)
Immature Granulocytes: 1 %
Lymphocytes Relative: 12 %
Lymphs Abs: 1 10*3/uL (ref 0.7–4.0)
MCH: 28.2 pg (ref 26.0–34.0)
MCHC: 33.8 g/dL (ref 30.0–36.0)
MCV: 83.5 fL (ref 80.0–100.0)
Monocytes Absolute: 0.8 10*3/uL (ref 0.1–1.0)
Monocytes Relative: 9 %
Neutro Abs: 6.5 10*3/uL (ref 1.7–7.7)
Neutrophils Relative %: 75 %
Platelets: 315 10*3/uL (ref 150–400)
RBC: 3.76 MIL/uL — ABNORMAL LOW (ref 4.22–5.81)
RDW: 14.9 % (ref 11.5–15.5)
WBC: 8.7 10*3/uL (ref 4.0–10.5)
nRBC: 0 % (ref 0.0–0.2)

## 2020-11-04 LAB — BRAIN NATRIURETIC PEPTIDE: B Natriuretic Peptide: 72.8 pg/mL (ref 0.0–100.0)

## 2020-11-04 LAB — GLUCOSE, CAPILLARY
Glucose-Capillary: 99 mg/dL (ref 70–99)
Glucose-Capillary: 146 mg/dL — ABNORMAL HIGH (ref 70–99)

## 2020-11-04 LAB — PROCALCITONIN: Procalcitonin: <0.1 ng/mL

## 2020-11-04 LAB — MAGNESIUM: Magnesium: 2.1 mg/dL (ref 1.7–2.4)

## 2020-11-04 MED ORDER — POTASSIUM CHLORIDE CRYS ER 20 MEQ PO TBCR
40.0000 meq | EXTENDED_RELEASE_TABLET | Freq: Once | ORAL | Status: AC
  Administered 2020-11-04: 40 meq via ORAL
  Filled 2020-11-04: qty 2

## 2020-11-04 MED ORDER — AMOXICILLIN-POT CLAVULANATE 875-125 MG PO TABS
1.0000 | ORAL_TABLET | Freq: Two times a day (BID) | ORAL | Status: DC
  Administered 2020-11-04 – 2020-11-05 (×3): 1 via ORAL
  Filled 2020-11-04 (×3): qty 1

## 2020-11-04 NOTE — Progress Notes (Addendum)
PROGRESS NOTE                                                                                                                                                                                                             Patient Demographics:    Richard Webb, is a 75 y.o. male, DOB - [birth year], UTM:[id number]  Outpatient Primary MD for the patient is [name], MD    LOS - 2  Admit date - 11/01/2020    Chief Complaint  Patient presents with  . Diverticulitis w/Abscess       Brief Narrative (HPI from H&P) Richard Webb is a 75 y.o. male with history of hypertension, diverticulitis, BPH, hypertension, CAD, sarcoidosis, recent MI and PE, who was recently admitted for NSTEMI requiring drug-eluting stent placement to LAD March 1 week of 2022, he also had acute PE that admission requiring treatment with Eliquis.  Of note patient has had diverticulitis issues for a while he was originally scheduled for outpatient colon resection by Dr. Henri Medal later this month but this was canceled as he developed heart issues earlier this month.  He developed abdominal discomfort and initially presented to outlying hospital where he was diagnosed with diverticular abscess and transferred to [facility] for further care.   Subjective:   Patient in bed, appears comfortable, denies any headache, no fever, no chest pain or pressure, no shortness of breath , no abdominal pain. No focal weakness.   Assessment  & Plan :     1. Abdominal pain due to diverticular abscess in a patient with history of diverticulitis who was originally scheduled for outpatient colon resection later this month, this procedure was canceled as he developed NSTEMI requiring stent placement on March 1 of 2020 - he is currently being treated conservatively with bowel rest, IV fluids and antibiotics.  Seen by [facility] due to recent coronary stent placement conservative  management, DW [facility] 3/17 and ID Dr. Baxter Flattery Augmentin x 8 weeks upon DC with [facility] folow up.  2.  History of NSTEMI with drug-eluting stent to LAD on October 19, 2020.  Continue Plavix, also takes Repatha shots every 2 weeks.  Cardiology following, Plavix to be continued for at least till 11/18/20 uninterrupted preferably 3 mths minimum from 10/19/20, if urgent surgery needed then bridge plavix with IV Aggrastat and Eliquis with IV Heparin .  3.  Recent subsegmental PE.  Now on Eliquis.  4.  Hypertension.  On combination of Norvasc, Imdur and beta-blocker.  Monitor.  5.  Dyslipidemia.  On Repatha shots twice a month.  6.  BPH.  On finasteride.  7.  GERD.  On Pepcid.  8. OSA - CPAP QHS.  9.  History of ulcerative colitis.  Supportive care.  10.  History of restless leg syndrome.  Continue combination of carbidopa levodopa.  11. Hypokalemia - replaced.      Condition - Extremely Guarded  Family Communication  : Jillene Bucks [phone] 11/02/20  Code Status :  Full  Consults  :   Cards, [facility]  PUD Prophylaxis : pepcid   Procedures  :           Disposition Plan  :    Status is: Inpatient  Remains inpatient appropriate because:IV treatments appropriate due to intensity of illness or inability to take PO   Dispo: The patient is from: Home              Anticipated d/c is to: Home              Patient currently is not medically stable to d/c.   Difficult to place patient No   DVT Prophylaxis  :   Heparin gtt >> Eliquis  Lab Results  Component Value Date   PLT 315 11/04/2020    Diet :  Diet Order            DIET SOFT Room service appropriate? Yes; Fluid consistency: Thin  Diet effective now                  Inpatient Medications  Scheduled Meds: . amLODipine  10 mg Oral Daily  . apixaban  5 mg Oral BID  . carbidopa-levodopa  1 tablet Oral QPM  . clopidogrel  75 mg Oral Daily  . [START ON 11/24/2020] cyanocobalamin  1,000 mcg Intramuscular Q30 days  . famotidine   40 mg Oral Daily  . finasteride  5 mg Oral Daily  . fluticasone furoate-vilanterol  1 puff Inhalation Daily  . folic acid  1 mg Oral Once per day on Mon Tue Wed Thu Fri  . isosorbide mononitrate  30 mg Oral Daily  . metoprolol tartrate  25 mg Oral BID   Continuous Infusions: . sodium chloride Stopped (11/04/20 0822)  . heparin Stopped (11/04/20 3244)  . piperacillin-tazobactam (ZOSYN)  IV 3.375 g (11/04/20 0623)   PRN Meds:.acetaminophen, hydrALAZINE, morphine injection, nitroGLYCERIN, temazepam  Antibiotics  :    Anti-infectives (From admission, onward)   Start     Dose/Rate Route Frequency Ordered Stop   11/02/20 1000  piperacillin-tazobactam (ZOSYN) IVPB 3.375 g        3.375 g 12.5 mL/hr over 240 Minutes Intravenous Every 8 hours 11/02/20 0412     11/02/20 0045  piperacillin-tazobactam (ZOSYN) IVPB 3.375 g        3.375 g 100 mL/hr over 30 Minutes Intravenous  Once 11/02/20 0044 11/02/20 0451       Time Spent in minutes  30   Lala Lund M.D on 11/04/2020 at 9:14 AM  To page go to [email or web address]   [facility] -  Office  [phone]   See all Orders from today for further details    Objective:   Vitals:   11/03/20 1539 11/03/20 2010 11/04/20 0330 11/04/20 0752  BP: 131/70 121/60 (!) 117/58 (!) 102/55  Pulse: 66 80 (!) 58 (!) 58  Resp: 16 16 17 14   Temp: 97.7 F (36.5 C) 99.2 F (37.3 C) 98.2 F (36.8 C) 98.2 F (36.8 C)  TempSrc: Oral Oral  Oral  SpO2: 98% 96% 96% 95%  Weight:      Height:        Wt Readings from Last 3 Encounters:  11/02/20 90.6 kg  10/21/20 100.4 kg  09/22/20 97.5 kg     Intake/Output Summary (Last 24 hours) at 11/04/2020 0914 Last data filed at 11/04/2020 0500 Gross per 24 hour  Intake 400 ml  Output 451 ml  Net -51 ml     Physical Exam  Awake Alert, No new F.N deficits, Normal affect [place].AT,PERRAL Supple Neck,No JVD, No cervical lymphadenopathy appriciated.  Symmetrical Chest wall movement, Good air movement  bilaterally, CTAB RRR,No Gallops, Rubs or new Murmurs, No Parasternal Heave +ve B.Sounds, Abd Soft, mild lower abdominal tenderness, No organomegaly appriciated, No rebound - guarding or rigidity. No Cyanosis, Clubbing or edema, No new Rash or bruise        Data Review:    CBC Recent Labs  Lab 11/01/20 2118 11/03/20 0620 11/04/20 0320  WBC 14.6* 10.7* 8.7  HGB 11.6* 10.7* 10.6*  HCT 36.0* 32.8* 31.4*  PLT 376 302 315  MCV 85.9 85.2 83.5  MCH 27.7 27.8 28.2  MCHC 32.2 32.6 33.8  RDW 15.0 14.8 14.9  LYMPHSABS 1.7 1.0 1.0  MONOABS 1.1* 0.9 0.8  EOSABS 0.2 0.3 0.3  BASOSABS 0.1 0.0 0.0    Recent Labs  Lab 11/01/20 2118 11/02/20 0727 11/03/20 0620 11/04/20 0320  NA 129*  --  133* 133*  K 3.9  --  3.3* 3.4*  CL 95*  --  100 101  CO2 24  --  25 24  GLUCOSE 113*  --  104* 101*  BUN 16  --  10 7*  CREATININE 1.08  --  1.05 0.96  CALCIUM 9.4  --  8.7* 8.7*  AST 17  --  15 12*  ALT 5  --  12 9  ALKPHOS 73  --  66 64  BILITOT 1.3*  --  1.0 0.9  ALBUMIN 3.6  --  2.8* 2.8*  MG  --   --  2.1 2.1  PROCALCITON  --  <0.10 <0.10 <0.10  BNP  --   --  67.3 72.8    ------------------------------------------------------------------------------------------------------------------ No results for input(s): CHOL, HDL, LDLCALC, TRIG, CHOLHDL, LDLDIRECT in the last 72 hours.  Lab Results  Component Value Date   HGBA1C 6.2 (H) 10/16/2020   ------------------------------------------------------------------------------------------------------------------ No results for input(s): TSH, T4TOTAL, T3FREE, THYROIDAB in the last 72 hours.  Invalid input(s): FREET3  Cardiac Enzymes No results for input(s): CKMB, TROPONINI, MYOGLOBIN in the last 168 hours.  Invalid input(s): CK ------------------------------------------------------------------------------------------------------------------    Component Value Date/Time   BNP 72.8 11/04/2020 0320   BNP 10 10/18/2017 1648    Micro  Results Recent Results (from the past 240 hour(s))  Resp Panel by RT-PCR (Flu A&B, Covid) Nasopharyngeal Swab     Status: None   Collection Time: 11/02/20  1:59 AM   Specimen: Nasopharyngeal Swab; Nasopharyngeal(NP) swabs in vial transport medium  Result Value Ref Range Status   SARS Coronavirus 2 by RT PCR NEGATIVE NEGATIVE Final    Comment: (NOTE) SARS-CoV-2 target nucleic acids are NOT DETECTED.  The SARS-CoV-2 RNA is generally detectable in upper respiratory specimens during the acute phase of infection. The lowest concentration of SARS-CoV-2 viral copies this assay can detect is 138 copies/mL.  A negative result does not preclude SARS-Cov-2 infection and should not be used as the sole basis for treatment or other patient management decisions. A negative result may occur with  improper specimen collection/handling, submission of specimen other than nasopharyngeal swab, presence of viral mutation(s) within the areas targeted by this assay, and inadequate number of viral copies(<138 copies/mL). A negative result must be combined with clinical observations, patient history, and epidemiological information. The expected result is Negative.  Fact Sheet for Patients:  EntrepreneurPulse.com.au  Fact Sheet for Healthcare Providers:  IncredibleEmployment.be  This test is no t yet approved or cleared by the [place] FDA and  has been authorized for detection and/or diagnosis of SARS-CoV-2 by FDA under an Emergency Use Authorization (EUA). This EUA will remain  in effect (meaning this test can be used) for the duration of the COVID-19 declaration under Section 564(b)(1) of the Act, 21 U.S.C.section 360bbb-3(b)(1), unless the authorization is terminated  or revoked sooner.       Influenza A by PCR NEGATIVE NEGATIVE Final   Influenza B by PCR NEGATIVE NEGATIVE Final    Comment: (NOTE) The Xpert Xpress SARS-CoV-2/FLU/RSV plus assay is intended as  an aid in the diagnosis of influenza from Nasopharyngeal swab specimens and should not be used as a sole basis for treatment. Nasal washings and aspirates are unacceptable for Xpert Xpress SARS-CoV-2/FLU/RSV testing.  Fact Sheet for Patients: EntrepreneurPulse.com.au  Fact Sheet for Healthcare Providers: IncredibleEmployment.be  This test is not yet approved or cleared by the [place] FDA and has been authorized for detection and/or diagnosis of SARS-CoV-2 by FDA under an Emergency Use Authorization (EUA). This EUA will remain in effect (meaning this test can be used) for the duration of the COVID-19 declaration under Section 564(b)(1) of the Act, 21 U.S.C. section 360bbb-3(b)(1), unless the authorization is terminated or revoked.  Performed at [facility], [place] [address] [place], [place] [address]     Radiology Reports CT ANGIO CHEST PE W OR WO CONTRAST  Result Date: 10/17/2020 CLINICAL DATA:  Onset of chest pain and shortness of breath last night. Abnormal chest x-ray. History of sarcoidosis, asthma and hypertension. Clinical suspicion of pulmonary embolism. EXAM: CT ANGIOGRAPHY CHEST WITH CONTRAST TECHNIQUE: Multidetector CT imaging of the chest was performed using the standard protocol during bolus administration of intravenous contrast. Multiplanar CT image reconstructions and MIPs were obtained to evaluate the vascular anatomy. CONTRAST:  170m OMNIPAQUE IOHEXOL 350 MG/ML SOLN COMPARISON:  Chest radiographs 10/16/2020. Abdominal CT 02/15/2020 and 04/17/2020. FINDINGS: Cardiovascular: The pulmonary arteries are well opacified with contrast to the level of the subsegmental branches. There is a single occluded subsegmental branch in the superior segment of the left lower lobe (images 28 through 34 of series 4), consistent with pulmonary embolism. The pulmonary arteries are otherwise patent, and there are no central pulmonary emboli. There  is central enlargement of the pulmonary arteries consistent with pulmonary arterial hypertension. There is moderate atherosclerosis of the aorta, great vessels and coronary arteries. The heart size is normal. There is no pericardial effusion. Mediastinum/Nodes: There are no enlarged mediastinal, hilar or axillary lymph nodes.There are scattered small partially calcified mediastinal and hilar lymph nodes bilaterally. The thyroid gland, trachea and esophagus demonstrate no significant findings. Lungs/Pleura: There is no pleural effusion or pneumothorax. There is extensive upper lobe predominant chronic lung disease with thickening of the bronchovascular bundles, peribronchovascular scarring and traction bronchiectasis, compatible with chronic sarcoidosis. The basilar components have progressed compared with the prior abdominal CT. Scattered  mucous plugging is noted. There is no confluent airspace opacity or suspicious pulmonary nodule. Upper abdomen: Nodular parents of the liver again noted suspicious for cirrhosis. No adrenal mass. Musculoskeletal/Chest wall: There is no chest wall mass or suspicious osseous finding. Review of the MIP images confirms the above findings. IMPRESSION: 1. Single small focus of pulmonary embolism involving a subsegmental branch of the superior segment of the left lower lobe. This is of questionable significance regarding the patient's symptoms. No central pulmonary emboli. Consider lower extremity venous doppler ultrasound. 2. Central enlargement of the pulmonary arteries consistent with pulmonary arterial hypertension. 3. 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 The basilar components have progressed compared with the prior abdominal CT and superimposed inflammation/infection is not completely excluded. No evidence of neoplasm. 4. Aortic Atherosclerosis (ICD10-I70.0). 5. Critical Value/emergent results were called by telephone at the time of  interpretation on 10/17/2020 at 4:16 pm to provider Aletta Edouard, who verbally acknowledged these results. Electronically Signed   By: Richardean Sale M.D.   On: 10/17/2020 16:17   CARDIAC CATHETERIZATION  Result Date: 10/19/2020  Prox LAD lesion is 90% stenosed.  Dist LM lesion is 30% stenosed.  2nd Diag lesion is 70% stenosed.  Mid LAD lesion is 50% stenosed.  Dist LAD-1 lesion is 90% stenosed.  Dist LAD-2 lesion is 80% stenosed.  Post intervention, there is a 0% residual stenosis.  2nd Mrg lesion is 50% stenosed.  Mid Cx to Dist Cx lesion is 70% stenosed.  Previously placed Prox Cx to Mid Cx stent (unknown type) is widely patent.  Ost Cx to Prox Cx lesion is 20% stenosed.  Previously placed Ost RCA to Prox RCA stent (unknown type) is widely patent.  Previously placed Dist RCA stent (unknown type) is widely patent.  Prox RCA to Mid RCA lesion is 50% stenosed.  Mid RCA-1 lesion is 50% stenosed.  Previously placed Mid RCA-2 stent (unknown type) is widely patent.  RV Branch lesion is 95% stenosed.  RPDA lesion is 70% stenosed.  RPAV lesion is 50% stenosed.  A stent was successfully placed.  Significant three-vessel CAD with previously placed stents in the circumflex, and RCA which remain patent. The LAD has 30% smooth ostial narrowing followed by 90% proximal LAD stenosis before the initial septal perforating artery.  The mid LAD and diagonal vessel is previously noted 70 and 50% stenoses.  There is diffuse distal apical LAD disease of 90 and 80%. The circumflex stent is widely patent.  There is mild proximal 20% narrowing.  There is mid AV groove circumflex stenoses of 70% with 50% OM 2 stenosis. The RCA has patent stents at the ostium, proximal to the acute margin, and prior to the PDA vessel.  There is previously noted 50% stenoses slightly improved from previously in the mid RCA with old 95% ostial stenosis and a small marginal branch.  The PDA is small and has ostial 70% stenosis and  there is 50% continuation branch stenosis in the distal RCA. Successful PCI with DES stenting of the proximal LAD with ultimate insertion of a 4.0 x 15 mm Resolute Onyx stent postdilated with stent taper from 4.45 to 4.20 mm with residual narrowing 0% and brisk TIMI-3 flow. LVEDP 13 mm. RECOMMENDATION: DAPT therapy ideally for greater than 1 year.  The patient will have to defer his planned upcoming surgery and possibly this can be done after 6 months if he remains stable.  Recommend increase medical therapy for concomitant CAD with increase nitrates, beta-blocker  therapy, consider initiation of ranolazine.  Continue aggressive lipid-lowering therapy with PCSK9 inhibition.   DG Chest Port 1 View  Result Date: 10/16/2020 CLINICAL DATA:  Chest pain. EXAM: PORTABLE CHEST 1 VIEW COMPARISON:  None. FINDINGS: The heart size and mediastinal contours are within normal limits. No pneumothorax or pleural effusion is noted. Bilateral upper lobe opacities are noted which may represent pneumonia or possibly edema, but neoplasm cannot be excluded. The visualized skeletal structures are unremarkable. IMPRESSION: Bilateral upper lobe opacities are noted which may represent pneumonia or possibly edema, but neoplasm cannot be excluded. CT scan of the chest is recommended for further evaluation. Electronically Signed   By: Marijo Conception M.D.   On: 10/16/2020 22:45   ECHOCARDIOGRAM COMPLETE  Result Date: 10/17/2020    ECHOCARDIOGRAM REPORT   Patient Name:   Richard Webb Date of Exam: 10/17/2020 Medical Rec #:  [id number]        Height:       72.0 in Accession #:    [id number]       Weight:       214.9 lb Date of Birth:  1946-03-20        BSA:          2.197 m Patient Age:    53 years         BP:           119/72 mmHg Patient Gender: M                HR:           60 bpm. Exam Location:  Inpatient Procedure: 2D Echo, Cardiac Doppler and Color Doppler Indications:    Dyspnea R06.00  History:        Patient has no prior  history of Echocardiogram examinations and                 Patient has prior history of Echocardiogram examinations, most                 recent 08/10/2020. CAD, Pulmonary HTN; Risk Factors:Dyslipidemia                 and Sleep Apnea. Sarcoidosis. GERD. Asthma.  Sonographer:    Darlina Sicilian RDCS Referring Phys: [place]  1. Left ventricular ejection fraction, by estimation, is 70 to 75%. The left ventricle has hyperdynamic function. The left ventricle has no regional wall motion abnormalities. There is mild left ventricular hypertrophy of the basal-septal segment. Left ventricular diastolic parameters are consistent with Grade I diastolic dysfunction (impaired relaxation).  2. Right ventricular systolic function is normal. The right ventricular size is normal.  3. The mitral valve is normal in structure. Trivial mitral valve regurgitation. No evidence of mitral stenosis.  4. The aortic valve is tricuspid. Aortic valve regurgitation is not visualized. Mild aortic valve sclerosis is present, with no evidence of aortic valve stenosis. FINDINGS  Left Ventricle: Left ventricular ejection fraction, by estimation, is 70 to 75%. The left ventricle has hyperdynamic function. The left ventricle has no regional wall motion abnormalities. The left ventricular internal cavity size was normal in size. There is mild left ventricular hypertrophy of the basal-septal segment. Left ventricular diastolic parameters are consistent with Grade I diastolic dysfunction (impaired relaxation). Right Ventricle: The right ventricular size is normal. Right ventricular systolic function is normal. Left Atrium: Left atrial size was normal in size. Right Atrium: Right atrial size was normal in size. Pericardium: There is no evidence  of pericardial effusion. Mitral Valve: The mitral valve is normal in structure. Mild mitral annular calcification. Trivial mitral valve regurgitation. No evidence of mitral valve stenosis.  Tricuspid Valve: The tricuspid valve is normal in structure. Tricuspid valve regurgitation is trivial. No evidence of tricuspid stenosis. Aortic Valve: The aortic valve is tricuspid. Aortic valve regurgitation is not visualized. Mild aortic valve sclerosis is present, with no evidence of aortic valve stenosis. Pulmonic Valve: The pulmonic valve was normal in structure. Pulmonic valve regurgitation is trivial. No evidence of pulmonic stenosis. Aorta: The aortic root is normal in size and structure. Venous: The inferior vena cava was not well visualized. IAS/Shunts: The interatrial septum was not well visualized.  LEFT VENTRICLE PLAX 2D LVIDd:         4.44 cm  Diastology LVIDs:         2.35 cm  LV e' medial:    5.33 cm/s LV PW:         0.89 cm  LV E/e' medial:  13.6 LV IVS:        1.38 cm  LV e' lateral:   5.44 cm/s LVOT diam:     2.30 cm  LV E/e' lateral: 13.3 LV SV:         110 LV SV Index:   50 LVOT Area:     4.15 cm                          3D Volume EF:                         3D EF:        54 %                         LV EDV:       130 ml                         LV ESV:       59 ml                         LV SV:        70 ml RIGHT VENTRICLE RV S prime:     9.03 cm/s TAPSE (M-mode): 2.0 cm LEFT ATRIUM             Index       RIGHT ATRIUM           Index LA diam:        3.80 cm 1.73 cm/m  RA Area:     10.90 cm LA Vol (A2C):   45.6 ml 20.76 ml/m RA Volume:   18.90 ml  8.60 ml/m LA Vol (A4C):   46.7 ml 21.26 ml/m LA Biplane Vol: 47.1 ml 21.44 ml/m  AORTIC VALVE LVOT Vmax:   115.00 cm/s LVOT Vmean:  87.500 cm/s LVOT VTI:    0.265 m  AORTA Ao Root diam: 3.70 cm Ao Asc diam:  3.40 cm MITRAL VALVE MV Area (PHT): 2.01 cm    SHUNTS MV Decel Time: 377 msec    Systemic VTI:  0.26 m MV E velocity: 72.40 cm/s  Systemic Diam: 2.30 cm MV A velocity: 81.80 cm/s MV E/A ratio:  0.89 Kirk Ruths MD Electronically signed by Kirk Ruths MD Signature Date/Time: 10/17/2020/12:12:14 PM    Final  VAS [place] LOWER EXTREMITY  VENOUS (DVT)  Result Date: 10/19/2020  Lower Venous DVT Study Indications: Pulmonary embolism.  Comparison Study: 04/19/20 previous Performing Technologist: Abram Sander RVS  Examination Guidelines: A complete evaluation includes B-mode imaging, spectral Doppler, color Doppler, and power Doppler as needed of all accessible portions of each vessel. Bilateral testing is considered an integral part of a complete examination. Limited examinations for reoccurring indications may be performed as noted. The reflux portion of the exam is performed with the patient in reverse Trendelenburg.  +---------+---------------+---------+-----------+----------+--------------+ RIGHT    CompressibilityPhasicitySpontaneityPropertiesThrombus Aging +---------+---------------+---------+-----------+----------+--------------+ CFV      Full           Yes      Yes                                 +---------+---------------+---------+-----------+----------+--------------+ SFJ      Full                                                        +---------+---------------+---------+-----------+----------+--------------+ FV Prox  Full                                                        +---------+---------------+---------+-----------+----------+--------------+ FV Mid   Full                                                        +---------+---------------+---------+-----------+----------+--------------+ FV DistalFull                                                        +---------+---------------+---------+-----------+----------+--------------+ PFV      Full                                                        +---------+---------------+---------+-----------+----------+--------------+ POP      Full           Yes      Yes                                 +---------+---------------+---------+-----------+----------+--------------+ PTV      Full                                                         +---------+---------------+---------+-----------+----------+--------------+ PERO     Full                                                        +---------+---------------+---------+-----------+----------+--------------+   +---------+---------------+---------+-----------+----------+--------------+  LEFT     CompressibilityPhasicitySpontaneityPropertiesThrombus Aging +---------+---------------+---------+-----------+----------+--------------+ CFV      Full           Yes      Yes                                 +---------+---------------+---------+-----------+----------+--------------+ SFJ      Full                                                        +---------+---------------+---------+-----------+----------+--------------+ FV Prox  Full                                                        +---------+---------------+---------+-----------+----------+--------------+ FV Mid   Full                                                        +---------+---------------+---------+-----------+----------+--------------+ FV DistalFull                                                        +---------+---------------+---------+-----------+----------+--------------+ PFV      Full                                                        +---------+---------------+---------+-----------+----------+--------------+ POP      Full           Yes      Yes                                 +---------+---------------+---------+-----------+----------+--------------+ PTV      Full                                                        +---------+---------------+---------+-----------+----------+--------------+ PERO     Full                                                        +---------+---------------+---------+-----------+----------+--------------+     Summary: BILATERAL: - No evidence of deep vein thrombosis seen in the lower extremities, bilaterally. - No  evidence of superficial venous thrombosis in the lower extremities, bilaterally. -No evidence of popliteal cyst, bilaterally.   *See table(s) above for measurements and observations. Electronically  signed by Curt Jews MD on 10/19/2020 at 7:18:23 PM.    Final

## 2020-11-04 NOTE — Progress Notes (Signed)
ANTICOAGULATION CONSULT NOTE - [facility] for IV Heparin> apixaban Indication: pulmonary embolus 10/17/20  Allergies  Allergen Reactions  . Bee Venom Anaphylaxis  . Statins Other (See Comments)    Myalgias - Simvastatin and Rosuvastatin    Patient Measurements: Height: 6' (182.9 cm) Weight: 90.6 kg (199 lb 11.8 oz) IBW/kg (Calculated) : 77.6  Heparin Dosing Weight:  90.6 kg  Vital Signs: Temp: 98.2 F (36.8 C) (03/17 0752) Temp Source: Oral (03/17 0752) BP: 102/55 (03/17 0752) Pulse Rate: 58 (03/17 0752)  Labs: Recent Labs    11/01/20 2118 11/02/20 1256 11/02/20 2105 11/03/20 0620 11/04/20 0320  HGB 11.6*  --   --  10.7* 10.6*  HCT 36.0*  --   --  32.8* 31.4*  PLT 376  --   --  302 315  APTT  --  56* 68* 67*  --   HEPARINUNFRC  --  0.84*  --  0.64  --   CREATININE 1.08  --   --  1.05 0.96    Estimated Creatinine Clearance: 74.1 mL/min (by C-G formula based on SCr of 0.96 mg/dL).   Medical History: Past Medical History:  Diagnosis Date  . Arthritis   . Asthma   . Coronary atherosclerosis of native coronary artery    a. DES x 3 RCA and DES mCx 10/11 - [place] - with residual 50-70% stenosis in the distal Cx in AV groove, 70% distal LAD beyond apex, 70% small D1. b. 12/2017: similar results with multivessel CAD. Patent stents. Medical management recommended.   . Diverticulitis   . GERD (gastroesophageal reflux disease)   . History of kidney stones   . Mixed hyperlipidemia   . Obstructive sleep apnea   . Pulmonary hypertension (HCC)    PASP 50 mmHg  . Restless leg syndrome   . Sarcoidosis   . Statin intolerance   . Ulcerative colitis [facility])     Assessment: 75 yr old male presented to the ED with diverticulitis/abscess. Pt is on apixaban PTA for small PE 10/17/20 (last dose at 2000 PM on 3/14); apixaban currently on hold and starting IV heparin, in case procedures are needed. Given recent apixaban use, will monitor anticoagulation  using aPTT until aPTT and heparin levels correlate.  No plans for surgery noted and plans to change to apixaban this morning  Goal of Therapy:  Heparin level 0.3-0.7 units/ml aPTT 66-102 seconds Monitor platelets by anticoagulation protocol: Yes   Plan:  -Start apixban at 10am and discontinue heparin  -Will sign off. Please contact pharmacy with any other needs.  Thank you, Hildred Laser, PharmD Clinical Pharmacist **Pharmacist phone directory can now be found on [email or web address] ([email or web address]).  Listed under [place].

## 2020-11-04 NOTE — Progress Notes (Signed)
CC: abdominal pain.    Subjective: No pain this AM, he is eating a soft diet.  Medicine plans to send him home on oral antibiotics per the patient if he does well.  He is still having air with voiding and had very loose stool yesterday.    Objective: Vital signs in last 24 hours: Temp:  [97.7 F (36.5 C)-99.2 F (37.3 C)] 98.2 F (36.8 C) (03/17 0752) Pulse Rate:  [58-80] 58 (03/17 0752) Resp:  [14-17] 14 (03/17 0752) BP: (102-131)/(55-70) 102/55 (03/17 0752) SpO2:  [95 %-98 %] 95 % (03/17 0752) Last BM Date: 11/01/20 400 IV recorded, no other intake recorded. 450 urine Stool x1 Afebrile, vital signs are stable. Sodium is 133, potassium is 3.4, glucose 101, calcium 8.7, albumin 2.8 AST 12 ALT 9 total bilirubin 0.9 BBC 8.7 H/H 10.6/31.4   Intake/Output from previous day: 03/16 0701 - 03/17 0700 In: 400 [I.V.:300; IV Piggyback:100] Out: 451 [Urine:450; Stool:1] Intake/Output this shift: No intake/output data recorded.  General appearance: alert, cooperative and no distress Resp: clear to auscultation bilaterally GI: soft, non-tender; bowel sounds normal; no masses,  no organomegaly and still reports air with voiding  Lab Results:  Recent Labs    11/03/20 0620 11/04/20 0320  WBC 10.7* 8.7  HGB 10.7* 10.6*  HCT 32.8* 31.4*  PLT 302 315    BMET Recent Labs    11/03/20 0620 11/04/20 0320  NA 133* 133*  K 3.3* 3.4*  CL 100 101  CO2 25 24  GLUCOSE 104* 101*  BUN 10 7*  CREATININE 1.05 0.96  CALCIUM 8.7* 8.7*   PT/INR No results for input(s): LABPROT, INR in the last 72 hours.  Recent Labs  Lab 11/01/20 2118 11/03/20 0620 11/04/20 0320  AST 17 15 12*  ALT 5 12 9   ALKPHOS 73 66 64  BILITOT 1.3* 1.0 0.9  PROT 7.5 6.2* 6.1*  ALBUMIN 3.6 2.8* 2.8*     Lipase     Component Value Date/Time   LIPASE 36 11/01/2020 2118     Medications: . amLODipine  10 mg Oral Daily  . apixaban  5 mg Oral BID  . carbidopa-levodopa  1 tablet Oral QPM  .  clopidogrel  75 mg Oral Daily  . [START ON 11/24/2020] cyanocobalamin  1,000 mcg Intramuscular Q30 days  . famotidine  40 mg Oral Daily  . finasteride  5 mg Oral Daily  . fluticasone furoate-vilanterol  1 puff Inhalation Daily  . folic acid  1 mg Oral Once per day on Mon Tue Wed Thu Fri  . isosorbide mononitrate  30 mg Oral Daily  . metoprolol tartrate  25 mg Oral BID    Assessment/Plan CAD, Recent NSTEMIs/p stent placement 3/1/22on plavix Recent PE on eliquis (last dose3/14/22 in[place]) - currently on IV heparin HTN HLD OSA GERD HxSarcoidosis Ulcerative colitis- [place]  Sigmoid diverticulitis with abscess Pneumaturia/colovisicular fistula -prior h/o several bouts of diverticulitis, most recently 03/2020. Was scheduled for elective surgery with Dr. Marcello Moores this month but this was cancelled due to recent NSTEMI and PE and he was advised to postpone surgery at least 6 months  - no role for acute surgical intervention or IR perc drain - Clinically improving on IV zosyn. Eating a soft diet.  Continue IV antibiotics. With new onset pneumaturia will check u/a and Ucx. Likely would benefit from low dose suppressant antibiotics until surgery.   Will add probiotic also.    ID -zosyn 3/15>> VTE -  IV heparin >> apixaban restarted this AM. FEN -IVF, FLD Foley -none Follow up -Dr. Marcello Moores   Plan:  As noted above, I have place a follow up with Dr. Marcello Moores in the [place].        LOS: 2 days    Webb,Richard 11/04/2020 Please see Amion

## 2020-11-05 LAB — CBC WITH DIFFERENTIAL/PLATELET
Abs Immature Granulocytes: 0.07 10*3/uL (ref 0.00–0.07)
Basophils Absolute: 0 10*3/uL (ref 0.0–0.1)
Basophils Relative: 0 %
Eosinophils Absolute: 0.4 10*3/uL (ref 0.0–0.5)
Eosinophils Relative: 4 %
HCT: 35 % — ABNORMAL LOW (ref 39.0–52.0)
Hemoglobin: 11.3 g/dL — ABNORMAL LOW (ref 13.0–17.0)
Immature Granulocytes: 1 %
Lymphocytes Relative: 10 %
Lymphs Abs: 1.1 10*3/uL (ref 0.7–4.0)
MCH: 27.4 pg (ref 26.0–34.0)
MCHC: 32.3 g/dL (ref 30.0–36.0)
MCV: 85 fL (ref 80.0–100.0)
Monocytes Absolute: 1 10*3/uL (ref 0.1–1.0)
Monocytes Relative: 10 %
Neutro Abs: 8.1 10*3/uL — ABNORMAL HIGH (ref 1.7–7.7)
Neutrophils Relative %: 75 %
Platelets: 358 10*3/uL (ref 150–400)
RBC: 4.12 MIL/uL — ABNORMAL LOW (ref 4.22–5.81)
RDW: 14.7 % (ref 11.5–15.5)
WBC: 10.7 10*3/uL — ABNORMAL HIGH (ref 4.0–10.5)
nRBC: 0 % (ref 0.0–0.2)

## 2020-11-05 LAB — COMPREHENSIVE METABOLIC PANEL
ALT: 11 U/L (ref 0–44)
AST: 13 U/L — ABNORMAL LOW (ref 15–41)
Albumin: 2.9 g/dL — ABNORMAL LOW (ref 3.5–5.0)
Alkaline Phosphatase: 61 U/L (ref 38–126)
Anion gap: 8 (ref 5–15)
BUN: 6 mg/dL — ABNORMAL LOW (ref 8–23)
CO2: 24 mmol/L (ref 22–32)
Calcium: 9.2 mg/dL (ref 8.9–10.3)
Chloride: 102 mmol/L (ref 98–111)
Creatinine, Ser: 0.92 mg/dL (ref 0.61–1.24)
GFR, Estimated: >60 mL/min (ref >60)
Glucose, Bld: 106 mg/dL — ABNORMAL HIGH (ref 70–99)
Potassium: 3.7 mmol/L (ref 3.5–5.1)
Sodium: 134 mmol/L — ABNORMAL LOW (ref 135–145)
Total Bilirubin: 0.9 mg/dL (ref 0.3–1.2)
Total Protein: 6.7 g/dL (ref 6.5–8.1)

## 2020-11-05 LAB — URINE CULTURE: Culture: NO GROWTH

## 2020-11-05 LAB — URIC ACID, RANDOM URINE: Uric Acid, Urine: 33.9 mg/dL

## 2020-11-05 LAB — MAGNESIUM: Magnesium: 2.1 mg/dL (ref 1.7–2.4)

## 2020-11-05 LAB — PROCALCITONIN: Procalcitonin: <0.1 ng/mL

## 2020-11-05 LAB — BRAIN NATRIURETIC PEPTIDE: B Natriuretic Peptide: 105 pg/mL — ABNORMAL HIGH (ref 0.0–100.0)

## 2020-11-05 LAB — GLUCOSE, CAPILLARY
Glucose-Capillary: 104 mg/dL — ABNORMAL HIGH (ref 70–99)
Glucose-Capillary: 93 mg/dL (ref 70–99)

## 2020-11-05 MED ORDER — DOCUSATE SODIUM 100 MG PO CAPS: 100.0000 mg | ORAL_CAPSULE | Freq: Two times a day (BID) | ORAL | 0 refills | Status: AC

## 2020-11-05 MED ORDER — RISAQUAD PO CAPS
1.0000 | ORAL_CAPSULE | Freq: Every day | ORAL | 0 refills | Status: DC
Start: 2020-11-05 — End: 2021-01-25

## 2020-11-05 MED ORDER — AMOXICILLIN-POT CLAVULANATE 875-125 MG PO TABS: 1.0000 | ORAL_TABLET | Freq: Two times a day (BID) | ORAL | 0 refills | Status: DC

## 2020-11-05 NOTE — Care Management Important Message (Signed)
Important Message  Patient Details  Name: Richard Webb MRN: [id number] Date of Birth: 01/31/1946   Medicare Important Message Given:  Yes - Important Message mailed due to current National Emergency   Verbal consent obtained due to current National Emergency  Relationship to patient: Self Contact Name: Rylei Call Date: 11/05/20  Time: 1114 Phone: [phone] Outcome: No Answer/Busy Important Message mailed to: Patient address on file    Delorse Lek 11/05/2020, 11:14 AM

## 2020-11-05 NOTE — Discharge Instructions (Signed)
Follow with Primary MD [name], MD in 7 days   Get CBC, CMP, 2 view Chest X ray -  checked next visit within 1 week by Primary MD   Activity: As tolerated with Full fall precautions use walker/cane & assistance as needed  Disposition Home   Diet: Soft  Special Instructions: If you have smoked or chewed Tobacco  in the last 2 yrs please stop smoking, stop any regular Alcohol  and or any Recreational drug use.  On your next visit with your primary care physician please Get Medicines reviewed and adjusted.  Please request your Prim.MD to go over all Hospital Tests and Procedure/Radiological results at the follow up, please get all Hospital records sent to your Prim MD by signing hospital release before you go home.  If you experience worsening of your admission symptoms, develop shortness of breath, life threatening emergency, suicidal or homicidal thoughts you must seek medical attention immediately by calling 911 or calling your MD immediately  if symptoms less severe.  You Must read complete instructions/literature along with all the possible adverse reactions/side effects for all the Medicines you take and that have been prescribed to you. Take any new Medicines after you have completely understood and accpet all the possible adverse reactions/side effects.     Soft-Food Eating Plan A soft-food eating plan includes foods that are safe and easy to chew and swallow. Your health care provider or dietitian can help you find foods and flavors that fit into this plan. Follow this plan until your health care provider or dietitian says it is safe to start eating other foods and food textures. What are tips for following this plan? General guidelines  Take small bites of food, or cut food into pieces about  inch or smaller. Bite-sized pieces of food are easier to chew and swallow.  Eat moist foods. Avoid overly dry foods.  Avoid foods that: ? Are difficult to swallow, such as dry,  chunky, crispy, or sticky foods. ? Are difficult to chew, such as hard, tough, or stringy foods. ? Contain nuts, seeds, or fruits.  Follow instructions from your dietitian about the types of liquids that are safe for you to swallow. You may be allowed to have: ? Thick liquids only. This includes only liquids that are thicker than honey. ? Thin and thick liquids. This includes all beverages and foods that become liquid at room temperature.  To make thick liquids: ? Purchase a commercial liquid thickening powder. These are available at grocery stores and pharmacies. ? Mix the thickener into liquids according to instructions on the label. ? Purchase ready-made thickened liquids. ? Thicken soup by pureeing, straining to remove chunks, and adding flour, potato flakes, or corn starch. ? Add commercial thickener to foods that become liquid at room temperature, such as milk shakes, yogurt, ice cream, gelatin, and sherbet.  Ask your health care provider whether you need to take a fiber supplement.   Cooking  Cook meats so they stay tender and moist. Use methods like braising, stewing, or baking in liquid.  Cook vegetables and fruit until they are soft enough to be mashed with a fork.  Peel soft, fresh fruits such as peaches, nectarines, and melons.  When making soup, make sure chunks of meat and vegetables are smaller than  inch.  Reheat leftover foods slowly so that a tough crust does not form. What foods are allowed? The items listed below may not be a complete list. Talk with your dietitian about what  dietary choices are best for you. Grains Breads, muffins, pancakes, or waffles moistened with syrup, jelly, or butter. Dry cereals well-moistened with milk. Moist, cooked cereals. Well-cooked pasta and rice. Vegetables All soft-cooked vegetables. Shredded lettuce. Fruits All canned and cooked fruits. Soft, peeled fresh fruits. Strawberries. Dairy Milk. Cream. Yogurt. Cottage cheese. Soft  cheese without the rind. Meats and other protein foods Tender, moist ground meat, poultry, or fish. Meat cooked in gravy or sauces. Eggs. Sweets and desserts Ice cream. Milk shakes. Sherbet. Pudding. Fats and oils Butter. Margarine. Olive, canola, sunflower, and grapeseed oil. Smooth salad dressing. Smooth cream cheese. Mayonnaise. Gravy. What foods are not allowed? The items listed bemay not be a complete list. Talk with your dietitian about what dietary choices are best for you. Grains Coarse or dry cereals, such as bran, granola, and shredded wheat. Tough or chewy crusty breads, such as [place] bread or baguettes. Breads with nuts, seeds, or fruit. Vegetables All raw vegetables. Cooked corn. Cooked vegetables that are tough or stringy. Tough, crisp, fried potatoes and potato skins. Fruits Fresh fruits with skins or seeds, or both, such as apples, pears, and grapes. Stringy, high-pulp fruits, such as papaya, pineapple, coconut, and mango. Fruit leather and all dried fruit. Dairy Yogurt with nuts or coconut. Meats and other protein foods Hard, dry sausages. Dry meat, poultry, or fish. Meats with gristle. Fish with bones. Fried meat or fish. Lunch meat and hotdogs. Nuts and seeds. Chunky peanut butter or other nut butters. Sweets and desserts Cakes or cookies that are very dry or chewy. Desserts with dried fruit, nuts, or coconut. Fried pastries. Very rich pastries. Fats and oils Cream cheese with fruit or nuts. Salad dressings with seeds or chunks. Summary  A soft-food eating plan includes foods that are safe and easy to swallow. Generally, the foods should be soft enough to be mashed with a fork.  Avoid foods that are dry, hard to chew, crunchy, sticky, stringy, or crispy.  Ask your health care provider whether you need to thicken your liquids and if you need to take a fiber supplement. This information is not intended to replace advice given to you by your health care provider. Make  sure you discuss any questions you have with your health care provider. Document Revised: 11/28/2018 Document Reviewed: 10/10/2016 Elsevier Patient Education  2021 [employer].

## 2020-11-05 NOTE — Progress Notes (Signed)
Patient discharged with education provided by primary RN regarding changes to medications and follow-up appointments with no questions or concerns voiced at this time.  Patient's PIV removed prior to discharge with family providing transportation home.

## 2020-11-05 NOTE — Progress Notes (Signed)
Subjective: CC: Patient reports no abdominal pain, n/v. Tolerating diet. He is passing flatus and had diarrhea this morning. Mobilizing well. Continues to have flecks of stool in his urine. Patient reports he has already been discharged.   Objective: Vital signs in last 24 hours: Temp:  [97.2 F (36.2 C)-98.6 F (37 C)] 98.6 F (37 C) (03/18 0804) Pulse Rate:  [62-87] 62 (03/18 0911) Resp:  [12-18] 18 (03/18 0911) BP: (120-138)/(69-80) 125/78 (03/18 0804) SpO2:  [94 %-100 %] 98 % (03/18 0911) Last BM Date: 11/04/20  Intake/Output from previous day: No intake/output data recorded. Intake/Output this shift: No intake/output data recorded.  PE: Gen:  Alert, NAD, pleasant Pulm: Normal rate and effort  Abd: Soft, protuberant abdomen, NT, +BS Psych: A&Ox3  Skin: no rashes noted, warm and dry   Lab Results:  Recent Labs    11/04/20 0320 11/05/20 0434  WBC 8.7 10.7*  HGB 10.6* 11.3*  HCT 31.4* 35.0*  PLT 315 358   BMET Recent Labs    11/04/20 0320 11/05/20 0434  NA 133* 134*  K 3.4* 3.7  CL 101 102  CO2 24 24  GLUCOSE 101* 106*  BUN 7* 6*  CREATININE 0.96 0.92  CALCIUM 8.7* 9.2   PT/INR No results for input(s): LABPROT, INR in the last 72 hours. CMP     Component Value Date/Time   NA 134 (L) 11/05/2020 0434   NA 136 03/18/2020 1129   K 3.7 11/05/2020 0434   CL 102 11/05/2020 0434   CO2 24 11/05/2020 0434   GLUCOSE 106 (H) 11/05/2020 0434   BUN 6 (L) 11/05/2020 0434   BUN 21 03/18/2020 1129   CREATININE 0.92 11/05/2020 0434   CREATININE 0.86 10/18/2017 1648   CALCIUM 9.2 11/05/2020 0434   PROT 6.7 11/05/2020 0434   ALBUMIN 2.9 (L) 11/05/2020 0434   AST 13 (L) 11/05/2020 0434   ALT 11 11/05/2020 0434   ALKPHOS 61 11/05/2020 0434   BILITOT 0.9 11/05/2020 0434   GFRNONAA >60 11/05/2020 0434   GFRAA >60 04/20/2020 0219   Lipase     Component Value Date/Time   LIPASE 36 11/01/2020 2118       Studies/Results: No results  found.  Anti-infectives: Anti-infectives (From admission, onward)   Start     Dose/Rate Route Frequency Ordered Stop   11/05/20 0000  amoxicillin-clavulanate (AUGMENTIN) 875-125 MG tablet        1 tablet Oral 2 times daily 11/05/20 0916 12/20/20 2359   11/04/20 1445  amoxicillin-clavulanate (AUGMENTIN) 875-125 MG per tablet 1 tablet        1 tablet Oral Every 12 hours 11/04/20 1351     11/02/20 1000  piperacillin-tazobactam (ZOSYN) IVPB 3.375 g  Status:  Discontinued        3.375 g 12.5 mL/hr over 240 Minutes Intravenous Every 8 hours 11/02/20 0412 11/04/20 1351   11/02/20 0045  piperacillin-tazobactam (ZOSYN) IVPB 3.375 g        3.375 g 100 mL/hr over 30 Minutes Intravenous  Once 11/02/20 0044 11/02/20 0451       Assessment/Plan CAD, Recent NSTEMIs/p stent placement 3/1/22on plavix Recent PE on eliquis  HTN HLD OSA GERD HxSarcoidosis Ulcerative colitis- [place]  Sigmoid diverticulitis with abscess Pneumaturia/colovesicule fistula -Prior h/o several bouts of diverticulitis, most recently 03/2020. Was scheduled for elective surgery with Dr. Marcello Moores this month but this was cancelled due to recent NSTEMI and PE and he was advised to  postpone surgery at least 6 months - No role for acute surgical intervention or IR perc drain - Patient is tolerating a diet without abdominal pain, n/v; is having bowel function; is NT on exam and afebrile. WBC did rise to 10.7 this am after being switched to PO abx yesterday. Will ensure with attending okay for d/c from our standpoint  - Patient will need long term abx until seen back in the office.  - Our team has already arranged follow up with the office.    LOS: 3 days    Jillyn Ledger , [facility] Surgery 11/05/2020, 10:40 AM Please see Amion for pager number during day hours 7:00am-4:30pm

## 2020-11-05 NOTE — Discharge Summary (Signed)
Richard Webb FIE:[id number] DOB: 1946-06-18 DOA: 11/01/2020  PCP: Richard Blitz, MD  Admit date: 11/01/2020  Discharge date: 11/05/2020  Admitted From: Home  Disposition:  Home   Recommendations for Outpatient Follow-up:   Follow up with PCP in 1-2 weeks  PCP Please obtain BMP/CBC, 2 view CXR in 1week,  (see Discharge instructions)   PCP Please follow up on the following pending results:    Home Health: None   Equipment/Devices: None  Consultations: [facility], Cards Discharge Condition: Stable    CODE STATUS: Full    Diet Recommendation: Soft  Diet Order            DIET SOFT Room service appropriate? Yes; Fluid consistency: Thin  Diet effective now                  Chief Complaint  Patient presents with  . Diverticulitis w/Abscess     Brief history of present illness from the day of admission and additional interim summary    Richard Cuthbertsonis a 75 y.o.malewithhistory of hypertension, diverticulitis, BPH, hypertension, CAD, sarcoidosis, recent MI and PE, who was recently admitted for NSTEMI requiring drug-eluting stent placement to LAD March 1 week of 2022, he also had acute PE that admission requiring treatment with Eliquis.  Of note patient has had diverticulitis issues for a while he was originally scheduled for outpatient colon resection by Dr. Henri Medal later this month but this was canceled as he developed heart issues earlier this month.  He developed abdominal discomfort and initially presented to outlying hospital where he was diagnosed with diverticular abscess and transferred to [facility] for further care.                                                                 Hospital Course   1. Abdominal pain due to diverticular abscess in a patient with history of diverticulitis who was  originally scheduled for outpatient colon resection later this month, this procedure was canceled as he developed NSTEMI requiring stent placement on March 1 of 75 - he was treated conservatively with bowel rest, IV fluids and antibiotics.  Seen by [facility] due to recent coronary stent placement conservative management, DW [facility] 3/17 and ID Dr. Baxter Flattery Augmentin x 6-8 weeks upon DC with [facility] folow up.  Today creatively symptom-free will be discharged on soft diet with 6 weeks of Augmentin, follow-up with [facility] in a month, antibiotic regimen can be adjusted at that visit if needed.  2.  History of NSTEMI with drug-eluting stent to LAD on October 19, 2020.  Continue Plavix, also takes Repatha shots every 2 weeks.  Cardiology following, Plavix to be continued for at least till 11/18/20 uninterrupted preferably 3 mths minimum from 10/19/20, if urgent surgery needed then bridge plavix with IV Aggrastat and Eliquis with  IV Heparin .  3.  Recent subsegmental PE.  Now on Eliquis.  4.  Hypertension.  On combination of Norvasc, Imdur and beta-blocker.  Monitor.  5.  Dyslipidemia.  On Repatha shots twice a month.  6.  BPH.  On finasteride.  7.  GERD.  On Pepcid.  8. OSA - CPAP QHS.  9.  History of ulcerative colitis.  Supportive care.  10.  History of restless leg syndrome.  Continue combination of carbidopa levodopa.      Discharge diagnosis     Principal Problem:   Intra-abdominal abscess ([place]) Active Problems:   BPH (benign prostatic hyperplasia)   Essential hypertension   Sarcoidosis   Pulmonary embolism [facility])    Discharge instructions    Discharge Instructions    Discharge instructions   Complete by: As directed    Follow with Primary MD Richard Blitz, MD in 7 days   Get CBC, CMP, 2 view Chest X ray -  checked next visit within 1 week by Primary MD   Activity: As tolerated with Full fall precautions use walker/cane & assistance as needed  Disposition Home   Diet: Soft  Special  Instructions: If you have smoked or chewed Tobacco  in the last 2 yrs please stop smoking, stop any regular Alcohol  and or any Recreational drug use.  On your next visit with your primary care physician please Get Medicines reviewed and adjusted.  Please request your Prim.MD to go over all Hospital Tests and Procedure/Radiological results at the follow up, please get all Hospital records sent to your Prim MD by signing hospital release before you go home.  If you experience worsening of your admission symptoms, develop shortness of breath, life threatening emergency, suicidal or homicidal thoughts you must seek medical attention immediately by calling 911 or calling your MD immediately  if symptoms less severe.  You Must read complete instructions/literature along with all the possible adverse reactions/side effects for all the Medicines you take and that have been prescribed to you. Take any new Medicines after you have completely understood and accpet all the possible adverse reactions/side effects.   Increase activity slowly   Complete by: As directed       Discharge Medications   Allergies as of 11/05/2020      Reactions   Bee Venom Anaphylaxis   Statins Other (See Comments)   Myalgias - Simvastatin and Rosuvastatin      Medication List    TAKE these medications   acidophilus Caps capsule Take 1 capsule by mouth daily.   amLODipine 10 MG tablet Commonly known as: NORVASC Take 10 mg by mouth daily.   amoxicillin 500 MG capsule Commonly known as: AMOXIL Take 1,500 mg by mouth once as needed. Take before dental appointment   amoxicillin-clavulanate 875-125 MG tablet Commonly known as: AUGMENTIN Take 1 tablet by mouth 2 (two) times daily. What changed: when to take this   apixaban 5 MG Tabs tablet Commonly known as: ELIQUIS Take 2 tablets twice daily until 3/7. On 3/8 start taking 1 tablet twice daily .   Breo Ellipta 200-25 MCG/INH Aepb Generic drug: fluticasone  furoate-vilanterol Inhale 1 Inhaler into the lungs daily.   carbidopa-levodopa 10-100 MG tablet Commonly known as: SINEMET IR Take 1 tablet by mouth at bedtime.   clopidogrel 75 MG tablet Commonly known as: PLAVIX Take 1 tablet (75 mg total) by mouth daily.   cyanocobalamin 1000 MCG/ML injection Commonly known as: (VITAMIN B-12) Inject 1,000 mcg into the muscle every  30 (thirty) days.   docusate sodium 100 MG capsule Commonly known as: Colace Take 1 capsule (100 mg total) by mouth 2 (two) times daily.   Dupilumab 300 MG/2ML Sopn Inject 300 mg into the skin every 14 (fourteen) days.   famotidine 20 MG tablet Commonly known as: PEPCID Take 1 tablet (20 mg total) by mouth every evening.   finasteride 5 MG tablet Commonly known as: PROSCAR Take 1 tablet (5 mg total) by mouth daily.   folic acid 1 MG tablet Commonly known as: FOLVITE Take 1 mg by mouth See admin instructions. Mon - Fri   hydrochlorothiazide 25 MG tablet Commonly known as: HYDRODIURIL Take 25 mg by mouth daily.   HYDROcodone-acetaminophen 5-325 MG tablet Commonly known as: NORCO/VICODIN Take 1 tablet by mouth every 6 (six) hours as needed for severe pain.   isosorbide mononitrate 30 MG 24 hr tablet Commonly known as: IMDUR TAKE 1 TABLET BY MOUTH  DAILY   losartan 50 MG tablet Commonly known as: COZAAR Take 50 mg by mouth daily.   metoprolol tartrate 25 MG tablet Commonly known as: LOPRESSOR Take 1 tablet (25 mg total) by mouth 2 (two) times daily.   montelukast 10 MG tablet Commonly known as: SINGULAIR Take 10 mg by mouth at bedtime.   nitroGLYCERIN 0.4 MG SL tablet Commonly known as: NITROSTAT Place 1 tablet (0.4 mg total) under the tongue every 5 (five) minutes x 3 doses as needed for chest pain (if no relief after 3rd dose, proceed to the ED for an evaluation or call 911).   omeprazole 20 MG capsule Commonly known as: PRILOSEC Take 20 mg by mouth daily.   oxymetazoline 0.05 % nasal  spray Commonly known as: AFRIN Place 1 spray into both nostrils 2 (two) times daily as needed for congestion.   penicillin v potassium 500 MG tablet Commonly known as: VEETID Take by mouth See admin instructions. TAKE 2 TABLETS BY MOUTH STAT THEN 1 TABLET FOUR TIMES DAILY FOR 7 DAYS   potassium chloride 10 MEQ tablet Commonly known as: KLOR-CON Take 10 mEq by mouth daily.   Repatha SureClick 161 MG/ML Soaj Generic drug: Evolocumab Inject 140 mg into the skin every 14 (fourteen) days.   sulfaSALAzine 500 MG tablet Commonly known as: AZULFIDINE Take 1,000 mg by mouth 2 (two) times daily.   temazepam 30 MG capsule Commonly known as: RESTORIL Take 30 mg by mouth at bedtime as needed for sleep.   Vitamin D (Ergocalciferol) 1.25 MG (50000 UNIT) Caps capsule Commonly known as: DRISDOL Take 50,000 Units by mouth every 7 (seven) days. Thursdays        Follow-up Information    Leighton Ruff, MD Follow up on 12/06/2020.   Specialties: General Surgery, Colon and Rectal Surgery Why: your appointment is at 10:20AM.  Be at the office 30 minutes early for check in.  Bring photo ID and insurance information with you.   Contact information: [place] [address] [phone]        Richard Blitz, MD. Schedule an appointment as soon as possible for a visit in 1 week(s).   Specialty: Internal Medicine Contact information: [address]  [place] [address] [phone]        Satira Sark, MD. Schedule an appointment as soon as possible for a visit in 1 week(s).   Specialty: Cardiology Contact information: [place] [address] [phone]  Major procedures and Radiology Reports - PLEASE review detailed and final reports thoroughly  -        CT ANGIO CHEST PE W OR WO CONTRAST  Result Date: 10/17/2020 CLINICAL DATA:  Onset of chest pain and shortness of breath last night. Abnormal chest x-ray. History of sarcoidosis,  asthma and hypertension. Clinical suspicion of pulmonary embolism. EXAM: CT ANGIOGRAPHY CHEST WITH CONTRAST TECHNIQUE: Multidetector CT imaging of the chest was performed using the standard protocol during bolus administration of intravenous contrast. Multiplanar CT image reconstructions and MIPs were obtained to evaluate the vascular anatomy. CONTRAST:  118m OMNIPAQUE IOHEXOL 350 MG/ML SOLN COMPARISON:  Chest radiographs 10/16/2020. Abdominal CT 02/15/2020 and 04/17/2020. FINDINGS: Cardiovascular: The pulmonary arteries are well opacified with contrast to the level of the subsegmental branches. There is a single occluded subsegmental branch in the superior segment of the left lower lobe (images 28 through 34 of series 4), consistent with pulmonary embolism. The pulmonary arteries are otherwise patent, and there are no central pulmonary emboli. There is central enlargement of the pulmonary arteries consistent with pulmonary arterial hypertension. There is moderate atherosclerosis of the aorta, great vessels and coronary arteries. The heart size is normal. There is no pericardial effusion. Mediastinum/Nodes: There are no enlarged mediastinal, hilar or axillary lymph nodes.There are scattered small partially calcified mediastinal and hilar lymph nodes bilaterally. The thyroid gland, trachea and esophagus demonstrate no significant findings. Lungs/Pleura: There is no pleural effusion or pneumothorax. There is extensive upper lobe predominant chronic lung disease with thickening of the bronchovascular bundles, peribronchovascular scarring and traction bronchiectasis, compatible with chronic sarcoidosis. The basilar components have progressed compared with the prior abdominal CT. Scattered mucous plugging is noted. There is no confluent airspace opacity or suspicious pulmonary nodule. Upper abdomen: Nodular parents of the liver again noted suspicious for cirrhosis. No adrenal mass. Musculoskeletal/Chest wall: There is  no chest wall mass or suspicious osseous finding. Review of the MIP images confirms the above findings. IMPRESSION: 1. Single small focus of pulmonary embolism involving a subsegmental branch of the superior segment of the left lower lobe. This is of questionable significance regarding the patient's symptoms. No central pulmonary emboli. Consider lower extremity venous doppler ultrasound. 2. Central enlargement of the pulmonary arteries consistent with pulmonary arterial hypertension. 3. 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 The basilar components have progressed compared with the prior abdominal CT and superimposed inflammation/infection is not completely excluded. No evidence of neoplasm. 4. Aortic Atherosclerosis (ICD10-I70.0). 5. Critical Value/emergent results were called by telephone at the time of interpretation on 10/17/2020 at 4:16 pm to provider MAletta Edouard who verbally acknowledged these results. Electronically Signed   By: WRichardean SaleM.D.   On: 10/17/2020 16:17   CARDIAC CATHETERIZATION  Result Date: 10/19/2020  Prox LAD lesion is 90% stenosed.  Dist LM lesion is 30% stenosed.  2nd Diag lesion is 70% stenosed.  Mid LAD lesion is 50% stenosed.  Dist LAD-1 lesion is 90% stenosed.  Dist LAD-2 lesion is 80% stenosed.  Post intervention, there is a 0% residual stenosis.  2nd Mrg lesion is 50% stenosed.  Mid Cx to Dist Cx lesion is 70% stenosed.  Previously placed Prox Cx to Mid Cx stent (unknown type) is widely patent.  Ost Cx to Prox Cx lesion is 20% stenosed.  Previously placed Ost RCA to Prox RCA stent (unknown type) is widely patent.  Previously placed Dist RCA stent (unknown type) is widely patent.  Prox RCA to Mid RCA lesion is  50% stenosed.  Mid RCA-1 lesion is 50% stenosed.  Previously placed Mid RCA-2 stent (unknown type) is widely patent.  RV Branch lesion is 95% stenosed.  RPDA lesion is 70% stenosed.  RPAV lesion is 50%  stenosed.  A stent was successfully placed.  Significant three-vessel CAD with previously placed stents in the circumflex, and RCA which remain patent. The LAD has 30% smooth ostial narrowing followed by 90% proximal LAD stenosis before the initial septal perforating artery.  The mid LAD and diagonal vessel is previously noted 70 and 50% stenoses.  There is diffuse distal apical LAD disease of 90 and 80%. The circumflex stent is widely patent.  There is mild proximal 20% narrowing.  There is mid AV groove circumflex stenoses of 70% with 50% OM 2 stenosis. The RCA has patent stents at the ostium, proximal to the acute margin, and prior to the PDA vessel.  There is previously noted 50% stenoses slightly improved from previously in the mid RCA with old 95% ostial stenosis and a small marginal branch.  The PDA is small and has ostial 70% stenosis and there is 50% continuation branch stenosis in the distal RCA. Successful PCI with DES stenting of the proximal LAD with ultimate insertion of a 4.0 x 15 mm Resolute Onyx stent postdilated with stent taper from 4.45 to 4.20 mm with residual narrowing 0% and brisk TIMI-3 flow. LVEDP 13 mm. RECOMMENDATION: DAPT therapy ideally for greater than 1 year.  The patient will have to defer his planned upcoming surgery and possibly this can be done after 6 months if he remains stable.  Recommend increase medical therapy for concomitant CAD with increase nitrates, beta-blocker therapy, consider initiation of ranolazine.  Continue aggressive lipid-lowering therapy with PCSK9 inhibition.   DG Chest Port 1 View  Result Date: 10/16/2020 CLINICAL DATA:  Chest pain. EXAM: PORTABLE CHEST 1 VIEW COMPARISON:  None. FINDINGS: The heart size and mediastinal contours are within normal limits. No pneumothorax or pleural effusion is noted. Bilateral upper lobe opacities are noted which may represent pneumonia or possibly edema, but neoplasm cannot be excluded. The visualized skeletal structures  are unremarkable. IMPRESSION: Bilateral upper lobe opacities are noted which may represent pneumonia or possibly edema, but neoplasm cannot be excluded. CT scan of the chest is recommended for further evaluation. Electronically Signed   By: Marijo Conception M.D.   On: 10/16/2020 22:45   ECHOCARDIOGRAM COMPLETE  Result Date: 10/17/2020    ECHOCARDIOGRAM REPORT   Patient Name:   JERIMY JOHANSON Date of Exam: 10/17/2020 Medical Rec #:  [id number]        Height:       72.0 in Accession #:    [id number]       Weight:       214.9 lb Date of Birth:  01-18-1946        BSA:          2.197 m Patient Age:    75 years         BP:           119/72 mmHg Patient Gender: M                HR:           60 bpm. Exam Location:  Inpatient Procedure: 2D Echo, Cardiac Doppler and Color Doppler Indications:    Dyspnea R06.00  History:        Patient has no prior history of Echocardiogram examinations and  Patient has prior history of Echocardiogram examinations, most                 recent 08/10/2020. CAD, Pulmonary HTN; Risk Factors:Dyslipidemia                 and Sleep Apnea. Sarcoidosis. GERD. Asthma.  Sonographer:    Darlina Sicilian RDCS Referring Phys: [place]  1. Left ventricular ejection fraction, by estimation, is 70 to 75%. The left ventricle has hyperdynamic function. The left ventricle has no regional wall motion abnormalities. There is mild left ventricular hypertrophy of the basal-septal segment. Left ventricular diastolic parameters are consistent with Grade I diastolic dysfunction (impaired relaxation).  2. Right ventricular systolic function is normal. The right ventricular size is normal.  3. The mitral valve is normal in structure. Trivial mitral valve regurgitation. No evidence of mitral stenosis.  4. The aortic valve is tricuspid. Aortic valve regurgitation is not visualized. Mild aortic valve sclerosis is present, with no evidence of aortic valve stenosis. FINDINGS  Left  Ventricle: Left ventricular ejection fraction, by estimation, is 70 to 75%. The left ventricle has hyperdynamic function. The left ventricle has no regional wall motion abnormalities. The left ventricular internal cavity size was normal in size. There is mild left ventricular hypertrophy of the basal-septal segment. Left ventricular diastolic parameters are consistent with Grade I diastolic dysfunction (impaired relaxation). Right Ventricle: The right ventricular size is normal. Right ventricular systolic function is normal. Left Atrium: Left atrial size was normal in size. Right Atrium: Right atrial size was normal in size. Pericardium: There is no evidence of pericardial effusion. Mitral Valve: The mitral valve is normal in structure. Mild mitral annular calcification. Trivial mitral valve regurgitation. No evidence of mitral valve stenosis. Tricuspid Valve: The tricuspid valve is normal in structure. Tricuspid valve regurgitation is trivial. No evidence of tricuspid stenosis. Aortic Valve: The aortic valve is tricuspid. Aortic valve regurgitation is not visualized. Mild aortic valve sclerosis is present, with no evidence of aortic valve stenosis. Pulmonic Valve: The pulmonic valve was normal in structure. Pulmonic valve regurgitation is trivial. No evidence of pulmonic stenosis. Aorta: The aortic root is normal in size and structure. Venous: The inferior vena cava was not well visualized. IAS/Shunts: The interatrial septum was not well visualized.  LEFT VENTRICLE PLAX 2D LVIDd:         4.44 cm  Diastology LVIDs:         2.35 cm  LV e' medial:    5.33 cm/s LV PW:         0.89 cm  LV E/e' medial:  13.6 LV IVS:        1.38 cm  LV e' lateral:   5.44 cm/s LVOT diam:     2.30 cm  LV E/e' lateral: 13.3 LV SV:         110 LV SV Index:   50 LVOT Area:     4.15 cm                          3D Volume EF:                         3D EF:        54 %                         LV EDV:  130 ml                         LV ESV:        59 ml                         LV SV:        70 ml RIGHT VENTRICLE RV S prime:     9.03 cm/s TAPSE (M-mode): 2.0 cm LEFT ATRIUM             Index       RIGHT ATRIUM           Index LA diam:        3.80 cm 1.73 cm/m  RA Area:     10.90 cm LA Vol (A2C):   45.6 ml 20.76 ml/m RA Volume:   18.90 ml  8.60 ml/m LA Vol (A4C):   46.7 ml 21.26 ml/m LA Biplane Vol: 47.1 ml 21.44 ml/m  AORTIC VALVE LVOT Vmax:   115.00 cm/s LVOT Vmean:  87.500 cm/s LVOT VTI:    0.265 m  AORTA Ao Root diam: 3.70 cm Ao Asc diam:  3.40 cm MITRAL VALVE MV Area (PHT): 2.01 cm    SHUNTS MV Decel Time: 377 msec    Systemic VTI:  0.26 m MV E velocity: 72.40 cm/s  Systemic Diam: 2.30 cm MV A velocity: 81.80 cm/s MV E/A ratio:  0.89 Kirk Ruths MD Electronically signed by Kirk Ruths MD Signature Date/Time: 10/17/2020/12:12:14 PM    Final    VAS [place] LOWER EXTREMITY VENOUS (DVT)  Result Date: 10/19/2020  Lower Venous DVT Study Indications: Pulmonary embolism.  Comparison Study: 04/19/20 previous Performing Technologist: Abram Sander RVS  Examination Guidelines: A complete evaluation includes B-mode imaging, spectral Doppler, color Doppler, and power Doppler as needed of all accessible portions of each vessel. Bilateral testing is considered an integral part of a complete examination. Limited examinations for reoccurring indications may be performed as noted. The reflux portion of the exam is performed with the patient in reverse Trendelenburg.  +---------+---------------+---------+-----------+----------+--------------+ RIGHT    CompressibilityPhasicitySpontaneityPropertiesThrombus Aging +---------+---------------+---------+-----------+----------+--------------+ CFV      Full           Yes      Yes                                 +---------+---------------+---------+-----------+----------+--------------+ SFJ      Full                                                         +---------+---------------+---------+-----------+----------+--------------+ FV Prox  Full                                                        +---------+---------------+---------+-----------+----------+--------------+ FV Mid   Full                                                        +---------+---------------+---------+-----------+----------+--------------+  FV DistalFull                                                        +---------+---------------+---------+-----------+----------+--------------+ PFV      Full                                                        +---------+---------------+---------+-----------+----------+--------------+ POP      Full           Yes      Yes                                 +---------+---------------+---------+-----------+----------+--------------+ PTV      Full                                                        +---------+---------------+---------+-----------+----------+--------------+ PERO     Full                                                        +---------+---------------+---------+-----------+----------+--------------+   +---------+---------------+---------+-----------+----------+--------------+ LEFT     CompressibilityPhasicitySpontaneityPropertiesThrombus Aging +---------+---------------+---------+-----------+----------+--------------+ CFV      Full           Yes      Yes                                 +---------+---------------+---------+-----------+----------+--------------+ SFJ      Full                                                        +---------+---------------+---------+-----------+----------+--------------+ FV Prox  Full                                                        +---------+---------------+---------+-----------+----------+--------------+ FV Mid   Full                                                         +---------+---------------+---------+-----------+----------+--------------+ FV DistalFull                                                        +---------+---------------+---------+-----------+----------+--------------+  PFV      Full                                                        +---------+---------------+---------+-----------+----------+--------------+ POP      Full           Yes      Yes                                 +---------+---------------+---------+-----------+----------+--------------+ PTV      Full                                                        +---------+---------------+---------+-----------+----------+--------------+ PERO     Full                                                        +---------+---------------+---------+-----------+----------+--------------+     Summary: BILATERAL: - No evidence of deep vein thrombosis seen in the lower extremities, bilaterally. - No evidence of superficial venous thrombosis in the lower extremities, bilaterally. -No evidence of popliteal cyst, bilaterally.   *See table(s) above for measurements and observations. Electronically signed by Curt Jews MD on 10/19/2020 at 7:18:23 PM.    Final     Micro Results     Recent Results (from the past 240 hour(s))  Resp Panel by RT-PCR (Flu A&B, Covid) Nasopharyngeal Swab     Status: None   Collection Time: 11/02/20  1:59 AM   Specimen: Nasopharyngeal Swab; Nasopharyngeal(NP) swabs in vial transport medium  Result Value Ref Range Status   SARS Coronavirus 2 by RT PCR NEGATIVE NEGATIVE Final    Comment: (NOTE) SARS-CoV-2 target nucleic acids are NOT DETECTED.  The SARS-CoV-2 RNA is generally detectable in upper respiratory specimens during the acute phase of infection. The lowest concentration of SARS-CoV-2 viral copies this assay can detect is 138 copies/mL. A negative result does not preclude SARS-Cov-2 infection and should not be used as the sole basis for  treatment or other patient management decisions. A negative result may occur with  improper specimen collection/handling, submission of specimen other than nasopharyngeal swab, presence of viral mutation(s) within the areas targeted by this assay, and inadequate number of viral copies(<138 copies/mL). A negative result must be combined with clinical observations, patient history, and epidemiological information. The expected result is Negative.  Fact Sheet for Patients:  EntrepreneurPulse.com.au  Fact Sheet for Healthcare Providers:  IncredibleEmployment.be  This test is no t yet approved or cleared by the [place] FDA and  has been authorized for detection and/or diagnosis of SARS-CoV-2 by FDA under an Emergency Use Authorization (EUA). This EUA will remain  in effect (meaning this test can be used) for the duration of the COVID-19 declaration under Section 564(b)(1) of the Act, 21 U.S.C.section 360bbb-3(b)(1), unless the authorization is terminated  or revoked sooner.       Influenza A by PCR NEGATIVE NEGATIVE Final   Influenza  B by PCR NEGATIVE NEGATIVE Final    Comment: (NOTE) The Xpert Xpress SARS-CoV-2/FLU/RSV plus assay is intended as an aid in the diagnosis of influenza from Nasopharyngeal swab specimens and should not be used as a sole basis for treatment. Nasal washings and aspirates are unacceptable for Xpert Xpress SARS-CoV-2/FLU/RSV testing.  Fact Sheet for Patients: EntrepreneurPulse.com.au  Fact Sheet for Healthcare Providers: IncredibleEmployment.be  This test is not yet approved or cleared by the [place] FDA and has been authorized for detection and/or diagnosis of SARS-CoV-2 by FDA under an Emergency Use Authorization (EUA). This EUA will remain in effect (meaning this test can be used) for the duration of the COVID-19 declaration under Section 564(b)(1) of the Act, 21  U.S.C. section 360bbb-3(b)(1), unless the authorization is terminated or revoked.  Performed at [facility], [place] [address] [place], [place] [address]   Culture, Urine     Status: None   Collection Time: 11/03/20  9:43 AM   Specimen: Urine, Catheterized  Result Value Ref Range Status   Specimen Description URINE, CATHETERIZED  Final   Special Requests NONE  Final   Culture   Final    NO GROWTH Performed at [facility], [address] [place], [place] [address]    Report Status 11/05/2020 FINAL  Final    Today   Subjective    Richard Webb today has no headache,no chest abdominal pain,no new weakness tingling or numbness, feels much better wants to go home today.     Objective   Blood pressure 125/78, pulse 62, temperature 98.6 F (37 C), temperature source Oral, resp. rate 18, height 6' (1.829 m), weight 90.6 kg, SpO2 98 %.  No intake or output data in the 24 hours ending 11/05/20 0916  Exam  Awake Alert, No new F.N deficits, Normal affect [place].AT,PERRAL Supple Neck,No JVD, No cervical lymphadenopathy appriciated.  Symmetrical Chest wall movement, Good air movement bilaterally, CTAB RRR,No Gallops,Rubs or new Murmurs, No Parasternal Heave +ve B.Sounds, Abd Soft, Non tender, No organomegaly appriciated, No rebound -guarding or rigidity. No Cyanosis, Clubbing or edema, No new Rash or bruise   Data Review   CBC w Diff:  Lab Results  Component Value Date   WBC 10.7 (H) 11/05/2020   HGB 11.3 (L) 11/05/2020   HCT 35.0 (L) 11/05/2020   PLT 358 11/05/2020   LYMPHOPCT 10 11/05/2020   MONOPCT 10 11/05/2020   EOSPCT 4 11/05/2020   BASOPCT 0 11/05/2020    CMP:  Lab Results  Component Value Date   NA 134 (L) 11/05/2020   NA 136 03/18/2020   K 3.7 11/05/2020   CL 102 11/05/2020   CO2 24 11/05/2020   BUN 6 (L) 11/05/2020   BUN 21 03/18/2020   CREATININE 0.92 11/05/2020   CREATININE 0.86 10/18/2017   PROT 6.7 11/05/2020   ALBUMIN 2.9 (L)  11/05/2020   BILITOT 0.9 11/05/2020   ALKPHOS 61 11/05/2020   AST 13 (L) 11/05/2020   ALT 11 11/05/2020  .   Total Time in preparing paper work, data evaluation and todays exam - 67 minutes  Lala Lund M.D on 11/05/2020 at 9:16 AM  [facility]

## 2020-11-07 NOTE — Progress Notes (Signed)
Cardiology Office Note  Date: 11/08/2020   ID: Richard Webb, DOB 10/15/1945, MRN [id number]  PCP:  [name], MD  Cardiologist:  Rozann Lesches, MD Electrophysiologist:  None   Chief Complaint: Hospital follow up  History of Present Illness: Richard Webb is a 75 y.o. male with a history of CAD, GERD, Diverticulitis, HLD, GERD, OSA, Pulmonary HTN, Sarcoidosis, Statin intolerance.  Previously seen by Dr. Domenic Polite on 07/12/2020.  He reported stable anginal symptoms.  No increased dyspnea on exertion.  Did report fatigue and having to pace himself.  Had previous been hospitalized in August with episodes of diverticulitis.  He was anticipating having a partial hemicolectomy.  Reported previously being seen at outside facility in [place] with evidence of fluid overload and leg swelling.  He was started on HCTZ at that time and had lost a significant amount of weight.  Last echocardiogram in 2019 with EF 55 to 60%.  He was continuing Norvasc, aspirin, Imdur, Lopressor.  On Repatha with previous LDL at 19.  Blood pressure was under reasonable control.  He was continuing HCTZ with improvement in leg swelling, fluid lost some weight loss.  Potassium chloride 10 mEq daily added.  Echocardiogram ordered for follow-up assessment.   Hospital admission 10/16/2020 after presenting to early [facility] with chest pain.  He was transferred to [facility] for NSTEMI.  Underwent a DES to proximal LAD on 10/19/2020.  CTA chest showed small PE in the left lower lobe.  Pulmonology evaluated and there was no acute component involving sarcoidosis.  He was started on triple therapy but developed a nosebleed on 10/20/2020 with hematuria.  His aspirin was discontinued.  Brilinta was transitioned to Plavix.  He was discharged home on Plavix and Eliquis.   Admitted 11/01/2020 with diverticulitis with abscess.  He was originally scheduled for outpatient colon resection by Dr. Leighton Ruff later in March..  He  developed abdominal discomfort at an outlying hospital and was transferred to [facility] for further care.  He was treated conservatively with bowel rest, IV fluids, and antibiotics.  He was discharged on Augmentin x6 to 8 weeks.  Plavix was to be continued for least until 11/18/2020 uninterrupted preferably 3 months minimal from 10/19/2020.  Discharge none stated if urgent surgery needed then bridge Plavix with IV Aggrastat and Eliquis with IV heparin.   He is here today for follow-up status post recent NSTEMI.  He denies any anginal or exertional symptoms, palpitations or arrhythmias, orthostatic symptoms, CVA or TIA-like symptoms, bleeding, PND, orthopnea.  No claudication-like symptoms, DVT or PE-like symptoms.  He is continuing Eliquis p.o. twice daily for recent PE.  Lower extremity venous study showed no evidence of DVT.  Continuing Plavix.  He is continuing antibiotics for his recent diverticular abscess.  Denies any lower extremity edema.  He states he is going to stay with his son for the next 3 weeks in the [facility] area.  Son is trying to get him into a cardiac rehab program either at [facility] or [facility].  Son states they will let us know which cardiac rehab program he can participate in and let us know so we can refer him there.  Blood pressure is slightly up today at 138/72.  He does not have a blood pressure cuff at home but son and daughter state they have access to blood pressure cuff.   Past Medical History:  Diagnosis Date  . Arthritis   . Asthma   . Coronary atherosclerosis  of native coronary artery    a. DES x 3 RCA and DES mCx 10/11 - [place] - with residual 50-70% stenosis in the distal Cx in AV groove, 70% distal LAD beyond apex, 70% small D1. b. 12/2017: similar results with multivessel CAD. Patent stents. Medical management recommended.   . Diverticulitis   . GERD (gastroesophageal reflux disease)   . History of kidney stones   . Mixed  hyperlipidemia   . Obstructive sleep apnea   . Pulmonary hypertension (HCC)    PASP 50 mmHg  . Restless leg syndrome   . Sarcoidosis   . Statin intolerance   . Ulcerative colitis [facility])     Past Surgical History:  Procedure Laterality Date  . Ankle fracture repair Left 1985  . CARDIAC CATHETERIZATION     stents x4  . CHOLECYSTECTOMY  1970  . CORONARY STENT INTERVENTION N/A 10/19/2020   Procedure: CORONARY STENT INTERVENTION;  Surgeon: Troy Sine, MD;  Location: [facility];  Service: Cardiovascular;  Laterality: N/A;  . CYSTOSCOPY WITH INSERTION OF UROLIFT N/A 08/27/2017   Procedure: CYSTOSCOPY WITH INSERTION OF UROLIFT;  Surgeon: Cleon Gustin, MD;  Location: [facility];  Service: Urology;  Laterality: N/A;  . LAPAROSCOPIC APPENDECTOMY  2013  . LEFT HEART CATH AND CORONARY ANGIOGRAPHY N/A 01/02/2018   Procedure: LEFT HEART CATH AND CORONARY ANGIOGRAPHY;  Surgeon: Troy Sine, MD;  Location: [facility];  Service: Cardiovascular;  Laterality: N/A;  . LEFT HEART CATH AND CORONARY ANGIOGRAPHY N/A 10/19/2020   Procedure: LEFT HEART CATH AND CORONARY ANGIOGRAPHY;  Surgeon: Troy Sine, MD;  Location: [facility];  Service: Cardiovascular;  Laterality: N/A;  . Right total knee replacement Bilateral 2005    Current Outpatient Medications  Medication Sig Dispense Refill  . acidophilus (RISAQUAD) CAPS capsule Take 1 capsule by mouth daily. 60 capsule 0  . amLODipine (NORVASC) 10 MG tablet Take 10 mg by mouth daily.    Marland Kitchen amoxicillin (AMOXIL) 500 MG capsule Take 1,500 mg by mouth once as needed. Take before dental appointment    . amoxicillin-clavulanate (AUGMENTIN) 875-125 MG tablet Take 1 tablet by mouth 2 (two) times daily. 90 tablet 0  . apixaban (ELIQUIS) 5 MG TABS tablet Take 2 tablets twice daily until 3/7. On 3/8 start taking 1 tablet twice daily . 60 tablet 3  . BREO ELLIPTA 200-25 MCG/INH AEPB Inhale 1 Inhaler into the lungs daily.  3  .  carbidopa-levodopa (SINEMET IR) 10-100 MG per tablet Take 1 tablet by mouth at bedtime.    . clopidogrel (PLAVIX) 75 MG tablet Take 1 tablet (75 mg total) by mouth daily. 30 tablet 5  . cyanocobalamin (,VITAMIN B-12,) 1000 MCG/ML injection Inject 1,000 mcg into the muscle every 30 (thirty) days.    Marland Kitchen docusate sodium (COLACE) 100 MG capsule Take 1 capsule (100 mg total) by mouth 2 (two) times daily. 60 capsule 0  . Dupilumab 300 MG/2ML SOPN Inject 300 mg into the skin every 14 (fourteen) days.    . Evolocumab (REPATHA SURECLICK) 694 MG/ML SOAJ Inject 140 mg into the skin every 14 (fourteen) days.    . famotidine (PEPCID) 20 MG tablet Take 1 tablet (20 mg total) by mouth every evening. 30 tablet 3  . finasteride (PROSCAR) 5 MG tablet Take 1 tablet (5 mg total) by mouth daily. 90 tablet 3  . folic acid (FOLVITE) 1 MG tablet Take 1 mg by mouth See admin instructions. Mon - Fri    .  hydrochlorothiazide (HYDRODIURIL) 25 MG tablet Take 25 mg by mouth daily.    Marland Kitchen HYDROcodone-acetaminophen (NORCO/VICODIN) 5-325 MG tablet Take 1 tablet by mouth every 6 (six) hours as needed for severe pain.    . isosorbide mononitrate (IMDUR) 30 MG 24 hr tablet TAKE 1 TABLET BY MOUTH  DAILY (Patient taking differently: Take 30 mg by mouth daily.) 90 tablet 3  . losartan (COZAAR) 50 MG tablet Take 50 mg by mouth daily.    . metoprolol tartrate (LOPRESSOR) 25 MG tablet Take 1 tablet (25 mg total) by mouth 2 (two) times daily. 60 tablet 3  . montelukast (SINGULAIR) 10 MG tablet Take 10 mg by mouth at bedtime.    . nitroGLYCERIN (NITROSTAT) 0.4 MG SL tablet Place 1 tablet (0.4 mg total) under the tongue every 5 (five) minutes x 3 doses as needed for chest pain (if no relief after 3rd dose, proceed to the ED for an evaluation or call 911). 90 tablet 3  . omeprazole (PRILOSEC) 20 MG capsule Take 20 mg by mouth daily.    Marland Kitchen oxymetazoline (AFRIN) 0.05 % nasal spray Place 1 spray into both nostrils 2 (two) times daily as needed for  congestion.    . penicillin v potassium (VEETID) 500 MG tablet Take by mouth See admin instructions. TAKE 2 TABLETS BY MOUTH STAT THEN 1 TABLET FOUR TIMES DAILY FOR 7 DAYS    . potassium chloride (KLOR-CON) 10 MEQ tablet Take 10 mEq by mouth daily.    Marland Kitchen sulfaSALAzine (AZULFIDINE) 500 MG tablet Take 1,000 mg by mouth 2 (two) times daily.    . temazepam (RESTORIL) 30 MG capsule Take 30 mg by mouth at bedtime as needed for sleep.     . Vitamin D, Ergocalciferol, (DRISDOL) 50000 UNITS CAPS capsule Take 50,000 Units by mouth every 7 (seven) days. Thursdays     No current facility-administered medications for this visit.   Allergies:  Bee venom and Statins   Social History: The patient  reports that he has never smoked. He has never used smokeless tobacco. He reports that he does not drink alcohol and does not use drugs.   Family History: The patient's family history includes CAD in his father; Colon cancer in his mother.   ROS:  Please see the history of present illness. Otherwise, complete review of systems is positive for none.  All other systems are reviewed and negative.   Physical Exam: VS:  BP 138/72   Pulse 78   Ht 6' (1.829 m)   Wt 212 lb (96.2 kg)   SpO2 98%   BMI 28.75 kg/m , BMI Body mass index is 28.75 kg/m.  Wt Readings from Last 3 Encounters:  11/08/20 212 lb (96.2 kg)  11/02/20 199 lb 11.8 oz (90.6 kg)  10/21/20 221 lb 6.4 oz (100.4 kg)    General: Patient appears comfortable at rest. Neck: Supple, no elevated JVP or carotid bruits, no thyromegaly. Lungs: Clear to auscultation, nonlabored breathing at rest. Cardiac: Regular rate and rhythm, no S3 or significant systolic murmur, no pericardial rub. Extremities: No pitting edema, distal pulses 2+. Skin: Warm and dry. Musculoskeletal: No kyphosis. Neuropsychiatric: Alert and oriented x3, affect grossly appropriate.  ECG:  EKG November 02, 2020 sinus bradycardia rate of 59, prolonged PR interval.  Abnormal R wave  progression, early transition.  Recent Labwork: 10/19/2020: TSH 2.119 11/05/2020: ALT 11; AST 13; B Natriuretic Peptide 105.0; BUN 6; Creatinine, Ser 0.92; Hemoglobin 11.3; Magnesium 2.1; Platelets 358; Potassium 3.7; Sodium 134  Component Value Date/Time   CHOL 88 10/19/2020 0531   CHOL 78 (L) 12/12/2019 0910   TRIG 190 (H) 10/19/2020 0531   HDL 47 10/19/2020 0531   HDL 42 12/12/2019 0910   CHOLHDL 1.9 10/19/2020 0531   VLDL 38 10/19/2020 0531   LDLCALC 3 10/19/2020 0531   LDLCALC 19 12/12/2019 0910    Other Studies Reviewed Today:  CTA chest PE 10/17/2020 IMPRESSION: 1. Single small focus of pulmonary embolism involving a subsegmental branch of the superior segment of the left lower lobe. This is of questionable significance regarding the patient's symptoms. No central pulmonary emboli. Consider lower extremity venous doppler ultrasound. 2. Central enlargement of the pulmonary arteries consistent with pulmonary arterial hypertension. 3. 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 The basilar components have progressed compared with the prior abdominal CT and superimposed inflammation/infection is not completely excluded. No evidence of neoplasm. 4. Aortic Atherosclerosis (ICD10-I70.0). 5. Critical Value/emergent results were called by telephone at the time of interpretation on 10/17/2020 at 4:16 pm to provider Aletta Edouard, who verbally acknowledged these results.    10/19/2020 CORONARY STENT INTERVENTION  LEFT HEART CATH AND CORONARY ANGIOGRAPHY    Conclusion    Prox LAD lesion is 90% stenosed.  Dist LM lesion is 30% stenosed.  2nd Diag lesion is 70% stenosed.  Mid LAD lesion is 50% stenosed.  Dist LAD-1 lesion is 90% stenosed.  Dist LAD-2 lesion is 80% stenosed.  Post intervention, there is a 0% residual stenosis.  2nd Mrg lesion is 50% stenosed.  Mid Cx to Dist Cx lesion is 70% stenosed.  Previously placed  Prox Cx to Mid Cx stent (unknown type) is widely patent.  Ost Cx to Prox Cx lesion is 20% stenosed.  Previously placed Ost RCA to Prox RCA stent (unknown type) is widely patent.  Previously placed Dist RCA stent (unknown type) is widely patent.  Prox RCA to Mid RCA lesion is 50% stenosed.  Mid RCA-1 lesion is 50% stenosed.  Previously placed Mid RCA-2 stent (unknown type) is widely patent.  RV Branch lesion is 95% stenosed.  RPDA lesion is 70% stenosed.  RPAV lesion is 50% stenosed.  A stent was successfully placed.   Significant three-vessel CAD with previously placed stents in the circumflex, and RCA which remain patent.  The LAD has 30% smooth ostial narrowing followed by 90% proximal LAD stenosis before the initial septal perforating artery.  The mid LAD and diagonal vessel is previously noted 70 and 50% stenoses.  There is diffuse distal apical LAD disease of 90 and 80%.  The circumflex stent is widely patent.  There is mild proximal 20% narrowing.  There is mid AV groove circumflex stenoses of 70% with 50% OM 2 stenosis.  The RCA has patent stents at the ostium, proximal to the acute margin, and prior to the PDA vessel.  There is previously noted 50% stenoses slightly improved from previously in the mid RCA with old 95% ostial stenosis and a small marginal branch.  The PDA is small and has ostial 70% stenosis and there is 50% continuation branch stenosis in the distal RCA.  Successful PCI with DES stenting of the proximal LAD with ultimate insertion of a 4.0 x 15 mm Resolute Onyx stent postdilated with stent taper from 4.45 to 4.20 mm with residual narrowing 0% and brisk TIMI-3 flow.  LVEDP 13 mm.  RECOMMENDATION: DAPT therapy ideally for greater than 1 year.  The patient will have to defer his planned upcoming surgery  and possibly this can be done after 6 months if he remains stable.  Recommend increase medical therapy for concomitant CAD with increase nitrates,  beta-blocker therapy, consider initiation of ranolazine.  Continue aggressive lipid-lowering therapy with PCSK9 inhibition.   Diagnostic Dominance: Right    Intervention          Echocardiogram 10/17/2020  Left ventricular ejection fraction, by estimation, is 70 to 75%. The left ventricle has hyperdynamic function. The left ventricle has no regional wall motion abnormalities. There is mild left ventricular hypertrophy of the basal-septal segment. Left ventricular diastolic parameters are consistent with Grade I diastolic dysfunction (impaired relaxation). 2. Right ventricular systolic function is normal. The right ventricular size is normal. 3. The mitral valve is normal in structure. Trivial mitral valve regurgitation. No evidence of mitral stenosis. 4. The aortic valve is tricuspid. Aortic valve regurgitation is not visualized. Mild aortic valve sclerosis is present, with no evidence of aortic valve stenosis.   Assessment and Plan:  1. NSTEMI (non-ST elevated myocardial infarction) ([place])   2. Other acute pulmonary embolism without acute cor pulmonale ([place])   3. Essential hypertension   4. Mixed hyperlipidemia   5. Colonic diverticular abscess    1. NSTEMI (non-ST elevated myocardial infarction) [facility]) Recent NSTEMI with DES to LAD.  Denies any anginal or exertional symptoms.  Continue Plavix 75 mg daily.  Not on aspirin due to systemic anticoagulation for PE on Eliquis 5 mg p.o. twice daily.  Continue Imdur 30 mg daily.  Continue Lopressor 25 mg p.o. twice daily.  Continue nitroglycerin 0.4 mg as needed.  Son and daughter are going to check into cardiac rehab program at either [facility] or [facility] and have to [facility] area.  Patient will be staying with his son for the immediate future.  2. Other acute pulmonary embolism without acute cor pulmonale (HCC) Recent left lower lobe subsegmental PE.  Continue Eliquis 5 mg p.o. twice daily.  No  complaints of shortness of breath, lower extremity edema.  Recent lower venous duplex showed no evidence of DVT.  3. Essential hypertension Blood pressure slightly elevated today 138/72.  Continue amlodipine 10 mg daily.  Continue HCTZ 25 mg daily.  Continue losartan 50 mg daily.  Continue metoprolol 25 mg p.o. twice daily.  Continue potassium supplementation.  Advised patient to purchase a blood pressure cuff and check at least 2-3 times a week.  Goal is blood pressure 130/80 or less.  Daughter states she has blood pressure cuff and they will use hers to check blood pressures.  4. Mixed hyperlipidemia Recent lipid panel 10/19/2020 total cholesterol 88, triglycerides 190, HDL 47, LDL 3.  Continue Repatha 140 mg p.o. q. 14 days.  5. Colonic diverticular abscess Recent colonic diverticular abscess.  Treated in hospital with IV antibiotics.  Currently on p.o. antibiotics.  Recently had scheduled partial colectomy with Dr. Marcello Moores.  This has been deferred for now.  Medication Adjustments/Labs and Tests Ordered: Current medicines are reviewed at length with the patient today.  Concerns regarding medicines are outlined above.   Disposition: Follow-up with Dr. Domenic Polite or APP 1 month  Signed, Levell July, NP 11/08/2020 8:26 AM    [facility] [address], [place], [place] [address] Phone: [phone]; Fax: [phone]

## 2020-11-08 ENCOUNTER — Telehealth: Payer: Self-pay | Admitting: Family Medicine

## 2020-11-08 ENCOUNTER — Encounter: Payer: Self-pay | Admitting: Family Medicine

## 2020-11-08 ENCOUNTER — Ambulatory Visit (INDEPENDENT_AMBULATORY_CARE_PROVIDER_SITE_OTHER): Payer: Medicare Other | Admitting: Family Medicine

## 2020-11-08 VITALS — BP 138/72 | HR 78 | Ht 72.0 in | Wt 212.0 lb

## 2020-11-08 DIAGNOSIS — I2699 Other pulmonary embolism without acute cor pulmonale: Secondary | ICD-10-CM | POA: Diagnosis not present

## 2020-11-08 DIAGNOSIS — I214 Non-ST elevation (NSTEMI) myocardial infarction: Secondary | ICD-10-CM | POA: Diagnosis not present

## 2020-11-08 DIAGNOSIS — K5720 Diverticulitis of large intestine with perforation and abscess without bleeding: Secondary | ICD-10-CM | POA: Diagnosis not present

## 2020-11-08 DIAGNOSIS — E782 Mixed hyperlipidemia: Secondary | ICD-10-CM

## 2020-11-08 DIAGNOSIS — K5190 Ulcerative colitis, unspecified, without complications: Secondary | ICD-10-CM | POA: Diagnosis not present

## 2020-11-08 DIAGNOSIS — I10 Essential (primary) hypertension: Secondary | ICD-10-CM | POA: Diagnosis not present

## 2020-11-08 DIAGNOSIS — K5732 Diverticulitis of large intestine without perforation or abscess without bleeding: Secondary | ICD-10-CM | POA: Diagnosis not present

## 2020-11-08 DIAGNOSIS — Z6828 Body mass index (BMI) 28.0-28.9, adult: Secondary | ICD-10-CM | POA: Diagnosis not present

## 2020-11-08 DIAGNOSIS — Z299 Encounter for prophylactic measures, unspecified: Secondary | ICD-10-CM | POA: Diagnosis not present

## 2020-11-08 DIAGNOSIS — K630 Abscess of intestine: Secondary | ICD-10-CM | POA: Diagnosis not present

## 2020-11-08 NOTE — Telephone Encounter (Signed)
Done, notes will be faxed.

## 2020-11-08 NOTE — Telephone Encounter (Signed)
The patient is going to stay with his son in the [facility] area. The son is going to check with [facility] and [facility] to see who offers cardiac rehab. They are then going to contact us as to the location of the cardiac rehab facility. We are then to refer him to that facility for cardiac rehabilitation due to the fact that he is staying with his son there. Thanks Jonni Sanger.  Per patient's son Patient will be going to [facility] for enrollment of cardiac rehabilitation . They are requesting referral along with records be faxed to [phone].

## 2020-11-08 NOTE — Patient Instructions (Signed)
Medication Instructions:  Continue all current medications.  Labwork: none  Testing/Procedures: none  Follow-Up: 1 month   Any Other Special Instructions Will Be Listed Below (If Applicable).  If you need a refill on your cardiac medications before your next appointment, please call your pharmacy.

## 2020-11-12 ENCOUNTER — Ambulatory Visit: Payer: Medicare Other | Admitting: Family Medicine

## 2020-11-14 ENCOUNTER — Other Ambulatory Visit: Payer: Self-pay | Admitting: Internal Medicine

## 2020-11-15 DIAGNOSIS — I214 Non-ST elevation (NSTEMI) myocardial infarction: Secondary | ICD-10-CM | POA: Diagnosis not present

## 2020-11-15 NOTE — Telephone Encounter (Signed)
Rx(s) sent to pharmacy electronically.  

## 2020-11-17 DIAGNOSIS — I214 Non-ST elevation (NSTEMI) myocardial infarction: Secondary | ICD-10-CM | POA: Diagnosis not present

## 2020-11-19 DIAGNOSIS — I214 Non-ST elevation (NSTEMI) myocardial infarction: Secondary | ICD-10-CM | POA: Diagnosis not present

## 2020-11-22 DIAGNOSIS — I214 Non-ST elevation (NSTEMI) myocardial infarction: Secondary | ICD-10-CM | POA: Diagnosis not present

## 2020-11-24 DIAGNOSIS — I214 Non-ST elevation (NSTEMI) myocardial infarction: Secondary | ICD-10-CM | POA: Diagnosis not present

## 2020-11-26 DIAGNOSIS — I214 Non-ST elevation (NSTEMI) myocardial infarction: Secondary | ICD-10-CM | POA: Diagnosis not present

## 2020-11-29 DIAGNOSIS — I214 Non-ST elevation (NSTEMI) myocardial infarction: Secondary | ICD-10-CM | POA: Diagnosis not present

## 2020-12-01 DIAGNOSIS — I214 Non-ST elevation (NSTEMI) myocardial infarction: Secondary | ICD-10-CM | POA: Diagnosis not present

## 2020-12-06 DIAGNOSIS — N321 Vesicointestinal fistula: Secondary | ICD-10-CM | POA: Diagnosis not present

## 2020-12-07 ENCOUNTER — Other Ambulatory Visit: Payer: Self-pay

## 2020-12-07 ENCOUNTER — Telehealth: Payer: Self-pay | Admitting: Internal Medicine

## 2020-12-07 DIAGNOSIS — E782 Mixed hyperlipidemia: Secondary | ICD-10-CM

## 2020-12-07 NOTE — Telephone Encounter (Signed)
    Pt made a 1 year f/u with Dr. Debara Pickett. He said he would like to get lipid lab work either at hospital in [place] or [facility] so its closer to him. He wanted to make sure the order is there

## 2020-12-07 NOTE — Telephone Encounter (Signed)
Called patient, advised I had placed LIPID level on file- he may get this a few days before his upcoming yearly appointment.  Patient verbalized understanding.

## 2020-12-09 ENCOUNTER — Ambulatory Visit (INDEPENDENT_AMBULATORY_CARE_PROVIDER_SITE_OTHER): Payer: Medicare Other | Admitting: Cardiology

## 2020-12-09 ENCOUNTER — Ambulatory Visit: Payer: Medicare Other | Admitting: Family Medicine

## 2020-12-09 ENCOUNTER — Encounter: Payer: Self-pay | Admitting: Cardiology

## 2020-12-09 ENCOUNTER — Telehealth: Payer: Self-pay | Admitting: *Deleted

## 2020-12-09 ENCOUNTER — Other Ambulatory Visit: Payer: Self-pay

## 2020-12-09 VITALS — BP 122/62 | HR 51 | Ht 72.0 in | Wt 208.0 lb

## 2020-12-09 DIAGNOSIS — Z789 Other specified health status: Secondary | ICD-10-CM

## 2020-12-09 DIAGNOSIS — E782 Mixed hyperlipidemia: Secondary | ICD-10-CM

## 2020-12-09 DIAGNOSIS — I25119 Atherosclerotic heart disease of native coronary artery with unspecified angina pectoris: Secondary | ICD-10-CM | POA: Diagnosis not present

## 2020-12-09 DIAGNOSIS — I2693 Single subsegmental pulmonary embolism without acute cor pulmonale: Secondary | ICD-10-CM

## 2020-12-09 NOTE — Progress Notes (Signed)
Cardiology Office Note  Date: 12/09/2020   ID: Richard Webb, DOB 12-Aug-1946, MRN [id number]  PCP:  [name], MD  Cardiologist:  Rozann Lesches, MD Electrophysiologist:  None   Chief Complaint  Patient presents with  . Cardiac follow-up    History of Present Illness: Richard Webb is a 75 y.o. male last seen in March by Mr. Leonides Sake NP.  He presents for a routine visit.  Recently back at his own home, he had been staying with his son in [place], in fact underwent a limited cardiac rehab course at [facility] which he graduated.  He does not describe any active angina, NYHA class II dyspnea with typical activities.  His plan is to exercise at a local gym 3 days a week at this point.  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  Still grieving the loss of his wife in January.  He was hospitalized in February with NSTEMI, underwent DES to the proximal LAD during hospital stay, also found to have small pulmonary embolus by chest CTA.  He has been on both Plavix and Eliquis following initial triple therapy.  He had a subsequent hospitalization with diverticulitis and abscess which was managed medically.  We went over his medications today which are outlined below.  Anticipate 12-monthtreatment course for pulmonary embolus.  He is on both Eliquis and Plavix, no reported spontaneous bleeding problems.  The earliest Plavix would be temporarily discontinued is 6 months from DES in March.  He anticipates need for colon surgery with his history of diverticulitis and abscess, [facility]  His most recent LDL was 3 on Repatha.  He has been following with Dr. HDebara Pickett  Question whether dose frequency could be cut back.  Past Medical History:  Diagnosis Date  . Arthritis   . Asthma   . Coronary atherosclerosis of native coronary artery    a. DES x 3 RCA and DES mCx 10/11 - [place] - with residual 50-70% stenosis in  the distal Cx in AV groove, 70% distal LAD beyond apex, 70% small D1. b. 12/2017: similar results with multivessel CAD. Patent stents. Medical management recommended.   . Diverticulitis   . GERD (gastroesophageal reflux disease)   . History of kidney stones   . Mixed hyperlipidemia   . Obstructive sleep apnea   . Pulmonary hypertension (HCC)    PASP 50 mmHg  . Restless leg syndrome   . Sarcoidosis   . Statin intolerance   . Ulcerative colitis ([facility]     Past Surgical History:  Procedure Laterality Date  . Ankle fracture repair Left 1985  . CARDIAC CATHETERIZATION     stents x4  . CHOLECYSTECTOMY  1970  . CORONARY STENT INTERVENTION N/A 10/19/2020   Procedure: CORONARY STENT INTERVENTION;  Surgeon: KTroy Sine MD;  [facility];  Service: Cardiovascular;  Laterality: N/A;  . CYSTOSCOPY WITH INSERTION OF UROLIFT N/A 08/27/2017   Procedure: CYSTOSCOPY WITH INSERTION OF UROLIFT;  Surgeon: MCleon Gustin MD;  Location: [facility];  Service: Urology;  Laterality: N/A;  . LAPAROSCOPIC APPENDECTOMY  2013  . LEFT HEART CATH AND CORONARY ANGIOGRAPHY N/A 01/02/2018   Procedure: LEFT HEART CATH AND CORONARY ANGIOGRAPHY;  Surgeon: KTroy Sine MD;  [facility];  Service: Cardiovascular;  Laterality: N/A;  . LEFT HEART CATH AND CORONARY ANGIOGRAPHY N/A 10/19/2020   Procedure: LEFT HEART CATH AND CORONARY ANGIOGRAPHY;  Surgeon: Troy Sine, MD;  Location: [place] [facility];  Service: Cardiovascular;  Laterality: N/A;  . Right total knee replacement Bilateral 2005    Current Outpatient Medications  Medication Sig Dispense Refill  . acidophilus (RISAQUAD) CAPS capsule Take 1 capsule by mouth daily. 60 capsule 0  . amLODipine (NORVASC) 10 MG tablet Take 10 mg by mouth daily.    Marland Kitchen amoxicillin (AMOXIL) 500 MG capsule Take 1,500 mg by mouth once as needed. Take before dental appointment    . apixaban (ELIQUIS) 5 MG TABS tablet Take 2 tablets twice daily until  3/7. On 3/8 start taking 1 tablet twice daily . 60 tablet 3  . BREO ELLIPTA 200-25 MCG/INH AEPB Inhale 1 Inhaler into the lungs daily.  3  . carbidopa-levodopa (SINEMET IR) 10-100 MG per tablet Take 1 tablet by mouth at bedtime.    . clopidogrel (PLAVIX) 75 MG tablet Take 1 tablet (75 mg total) by mouth daily. 30 tablet 5  . cyanocobalamin (,VITAMIN B-12,) 1000 MCG/ML injection Inject 1,000 mcg into the muscle every 30 (thirty) days.    . Dupilumab 300 MG/2ML SOPN Inject 300 mg into the skin every 14 (fourteen) days.    . Evolocumab (REPATHA SURECLICK) 676 MG/ML SOAJ Inject 1 Dose into the skin every 14 (fourteen) days. <PLEASE MAKE APPOINTMENT FOR REFILLS> 6 mL 0  . famotidine (PEPCID) 20 MG tablet Take 1 tablet (20 mg total) by mouth every evening. 30 tablet 3  . famotidine (PEPCID) 20 MG tablet TAKE 1 TABLET (20 MG TOTAL) BY MOUTH EVERY EVENING. 30 tablet 3  . finasteride (PROSCAR) 5 MG tablet Take 1 tablet (5 mg total) by mouth daily. 90 tablet 3  . folic acid (FOLVITE) 1 MG tablet Take 1 mg by mouth See admin instructions. Mon - Fri    . hydrochlorothiazide (HYDRODIURIL) 25 MG tablet Take 25 mg by mouth daily.    Marland Kitchen HYDROcodone-acetaminophen (NORCO/VICODIN) 5-325 MG tablet Take 1 tablet by mouth every 6 (six) hours as needed for severe pain.    . isosorbide mononitrate (IMDUR) 30 MG 24 hr tablet TAKE 1 TABLET BY MOUTH  DAILY (Patient taking differently: Take 30 mg by mouth daily.) 90 tablet 3  . losartan (COZAAR) 50 MG tablet Take 50 mg by mouth daily.    . metoprolol tartrate (LOPRESSOR) 25 MG tablet Take 1 tablet (25 mg total) by mouth 2 (two) times daily. 60 tablet 3  . metoprolol tartrate (LOPRESSOR) 25 MG tablet TAKE 1 TABLET (25 MG TOTAL) BY MOUTH TWO TIMES DAILY. 60 tablet 3  . montelukast (SINGULAIR) 10 MG tablet Take 10 mg by mouth at bedtime.    . nitroGLYCERIN (NITROSTAT) 0.4 MG SL tablet Place 1 tablet (0.4 mg total) under the tongue every 5 (five) minutes x 3 doses as needed for  chest pain (if no relief after 3rd dose, proceed to the ED for an evaluation or call 911). 90 tablet 3  . omeprazole (PRILOSEC) 20 MG capsule Take 20 mg by mouth daily.    Marland Kitchen oxymetazoline (AFRIN) 0.05 % nasal spray Place 1 spray into both nostrils 2 (two) times daily as needed for congestion.    . penicillin v potassium (VEETID) 500 MG tablet Take by mouth See admin instructions. TAKE 2 TABLETS BY MOUTH STAT THEN 1 TABLET FOUR TIMES DAILY FOR 7 DAYS    . potassium chloride (KLOR-CON) 10 MEQ tablet Take 10 mEq by mouth daily.    Marland Kitchen sulfaSALAzine (AZULFIDINE) 500 MG tablet Take 1,000  mg by mouth 2 (two) times daily.    . temazepam (RESTORIL) 30 MG capsule Take 30 mg by mouth at bedtime as needed for sleep.     . Vitamin D, Ergocalciferol, (DRISDOL) 50000 UNITS CAPS capsule Take 50,000 Units by mouth every 7 (seven) days. Thursdays     No current facility-administered medications for this visit.   Allergies:  Bee venom and Statins   ROS: No palpitations or syncope.  No orthopnea or PND.  Physical Exam: VS:  BP 122/62   Pulse (!) 51   Ht 6' (1.829 m)   Wt 208 lb (94.3 kg)   SpO2 96%   BMI 28.21 kg/m , BMI Body mass index is 28.21 kg/m.  Wt Readings from Last 3 Encounters:  12/09/20 208 lb (94.3 kg)  11/08/20 212 lb (96.2 kg)  11/02/20 199 lb 11.8 oz (90.6 kg)    General: Patient appears comfortable at rest. HEENT: Conjunctiva and lids normal, wearing a mask. Neck: Supple, no elevated JVP or carotid bruits, no thyromegaly. Lungs: Clear to auscultation, nonlabored breathing at rest. Cardiac: Regular rate and rhythm, no S3 or significant systolic murmur, no pericardial rub.Marland Kitchen Extremities: No pitting edema, distal pulses 2+.  ECG:  An ECG dated 11/02/2020 was personally reviewed today and demonstrated:  Sinus rhythm with prolonged PR interval.  Recent Labwork: 10/19/2020: TSH 2.119 11/05/2020: ALT 11; AST 13; B Natriuretic Peptide 105.0; BUN 6; Creatinine, Ser 0.92; Hemoglobin 11.3;  Magnesium 2.1; Platelets 358; Potassium 3.7; Sodium 134     Component Value Date/Time   CHOL 88 10/19/2020 0531   CHOL 78 (L) 12/12/2019 0910   TRIG 190 (H) 10/19/2020 0531   HDL 47 10/19/2020 0531   HDL 42 12/12/2019 0910   CHOLHDL 1.9 10/19/2020 0531   VLDL 38 10/19/2020 0531   LDLCALC 3 10/19/2020 0531   LDLCALC 19 12/12/2019 0910    Other Studies Reviewed Today:  Echocardiogram 10/17/2020: 1. Left ventricular ejection fraction, by estimation, is 70 to 75%. The  left ventricle has hyperdynamic function. The left ventricle has no  regional wall motion abnormalities. There is mild left ventricular  hypertrophy of the basal-septal segment. Left  ventricular diastolic parameters are consistent with Grade I diastolic  dysfunction (impaired relaxation).  2. Right ventricular systolic function is normal. The right ventricular  size is normal.  3. The mitral valve is normal in structure. Trivial mitral valve  regurgitation. No evidence of mitral stenosis.  4. The aortic valve is tricuspid. Aortic valve regurgitation is not  visualized. Mild aortic valve sclerosis is present, with no evidence of  aortic valve stenosis.   Cardiac catheterization 10/19/2020:  Prox LAD lesion is 90% stenosed.  Dist LM lesion is 30% stenosed.  2nd Diag lesion is 70% stenosed.  Mid LAD lesion is 50% stenosed.  Dist LAD-1 lesion is 90% stenosed.  Dist LAD-2 lesion is 80% stenosed.  Post intervention, there is a 0% residual stenosis.  2nd Mrg lesion is 50% stenosed.  Mid Cx to Dist Cx lesion is 70% stenosed.  Previously placed Prox Cx to Mid Cx stent (unknown type) is widely patent.  Ost Cx to Prox Cx lesion is 20% stenosed.  Previously placed Ost RCA to Prox RCA stent (unknown type) is widely patent.  Previously placed Dist RCA stent (unknown type) is widely patent.  Prox RCA to Mid RCA lesion is 50% stenosed.  Mid RCA-1 lesion is 50% stenosed.  Previously placed Mid RCA-2 stent  (unknown type) is widely patent.  RV [employer]  lesion is 95% stenosed.  RPDA lesion is 70% stenosed.  RPAV lesion is 50% stenosed.  A stent was successfully placed.   Significant three-vessel CAD with previously placed stents in the circumflex, and RCA which remain patent.  The LAD has 30% smooth ostial narrowing followed by 90% proximal LAD stenosis before the initial septal perforating artery.  The mid LAD and diagonal vessel is previously noted 70 and 50% stenoses.  There is diffuse distal apical LAD disease of 90 and 80%.  The circumflex stent is widely patent.  There is mild proximal 20% narrowing.  There is mid AV groove circumflex stenoses of 70% with 50% OM 2 stenosis.  The RCA has patent stents at the ostium, proximal to the acute margin, and prior to the PDA vessel.  There is previously noted 50% stenoses slightly improved from previously in the mid RCA with old 95% ostial stenosis and a small marginal branch.  The PDA is small and has ostial 70% stenosis and there is 50% continuation branch stenosis in the distal RCA.  Successful PCI with DES stenting of the proximal LAD with ultimate insertion of a 4.0 x 15 mm Resolute Onyx stent postdilated with stent taper from 4.45 to 4.20 mm with residual narrowing 0% and brisk TIMI-3 flow.  LVEDP 13 mm.  Assessment and Plan:  1.  CAD status post multiple percutaneous coronary interventions, most recently DES to the proximal LAD in the setting of NSTEMI.  No active angina at this time, completed limited cardiac rehabilitation session as discussed above and plans to continue with regular exercise 3 days a week at a local gym.  Continue Plavix, Repatha, Norvasc, Cozaar, Lopressor, and as needed nitroglycerin.  2.  Small pulmonary embolus affecting subsegmental branch of the superior segment of the left lower lobe in February of this year.  He is on Eliquis, would continue for 23-monthtreatment course.  3.  Mixed hyperlipidemia, currently  on Repatha.  Recent LDL was 3.  Will confer with Dr. HDebara Pickettregarding potential reduction in dose frequency.  4.  History of diverticulitis and abscess.  [facility].  This has been deferred in light of the above discussed problems.  At a minimum, he needs 6 months on Plavix and Eliquis, ideally would be on dual antiplatelet regimen for 1 year.  We will bring him back and reevaluate later in the summer.  Medication Adjustments/Labs and Tests Ordered: Current medicines are reviewed at length with the patient today.  Concerns regarding medicines are outlined above.   Tests Ordered: No orders of the defined types were placed in this encounter.   Medication Changes: No orders of the defined types were placed in this encounter.   Disposition:  Follow up 3 months.  Signed, SSatira Sark MD, F[facility]4/21/2022 1:45 PM    [place]Alligatorat [place] [place] [id number]: [phone] Fax: [phone]

## 2020-12-09 NOTE — Patient Instructions (Addendum)
Medication Instructions:   Your physician recommends that you continue on your current medications as directed. Please refer to the Current Medication list given to you today.  Labwork:  none  Testing/Procedures:  none  Follow-Up:  Your physician recommends that you schedule a follow-up appointment in: 3 months.  Any Other Special Instructions Will Be Listed Below (If Applicable).  Dr. Domenic Polite will contact Dr. Debara Pickett about your LDL.  If you need a refill on your cardiac medications before your next appointment, please call your pharmacy.

## 2020-12-09 NOTE — Telephone Encounter (Signed)
Message left on vm of [facility] that cardiac referral no longer needed at their facility since patient decided to have cardiac rehab at another facility.

## 2020-12-10 ENCOUNTER — Telehealth: Payer: Self-pay | Admitting: *Deleted

## 2020-12-10 NOTE — Telephone Encounter (Signed)
Satira Sark, MD  Merlene Laughter, RN See reply from Dr. Debara Pickett regarding low LDL. Let patient know to continue present therapy and Dr. Debara Pickett will readdress based on next levels.        Previous Messages   ----- Message -----  From: Pixie Casino, MD  Sent: 12/09/2020  8:26 PM EDT  To: Satira Sark, MD   Sam-   I would usually stop or reduce the dose of statin or zetia, but in this case, since he is only on Repatha, I would continue it. There actually is some data for further relative risk reduction with LDL <10 mg/dL and no significant safety concerns. If the next test results in a negative calculated LDL, then we will have to try another strategy. Dosing every 4 weeks leads to rebound back to baseline levels prior to the next shot and therefore does not work. Sometimes we can switch to lower dose Praluent 75 mg q2weeks as well.   I would just continue his current treatment for now.   Thanks,   Mali

## 2020-12-10 NOTE — Telephone Encounter (Signed)
Patient informed and verbalized understanding of plan. 

## 2020-12-13 DIAGNOSIS — I10 Essential (primary) hypertension: Secondary | ICD-10-CM | POA: Diagnosis not present

## 2020-12-13 DIAGNOSIS — K5190 Ulcerative colitis, unspecified, without complications: Secondary | ICD-10-CM | POA: Diagnosis not present

## 2020-12-13 DIAGNOSIS — K7460 Unspecified cirrhosis of liver: Secondary | ICD-10-CM | POA: Diagnosis not present

## 2020-12-13 DIAGNOSIS — L4050 Arthropathic psoriasis, unspecified: Secondary | ICD-10-CM | POA: Diagnosis not present

## 2020-12-13 DIAGNOSIS — Z299 Encounter for prophylactic measures, unspecified: Secondary | ICD-10-CM | POA: Diagnosis not present

## 2020-12-13 DIAGNOSIS — Z6827 Body mass index (BMI) 27.0-27.9, adult: Secondary | ICD-10-CM | POA: Diagnosis not present

## 2020-12-21 ENCOUNTER — Other Ambulatory Visit (HOSPITAL_COMMUNITY): Payer: Self-pay

## 2020-12-22 ENCOUNTER — Ambulatory Visit: Payer: Medicare Other | Admitting: Urology

## 2020-12-30 DIAGNOSIS — K7460 Unspecified cirrhosis of liver: Secondary | ICD-10-CM | POA: Diagnosis not present

## 2020-12-30 DIAGNOSIS — K5150 Left sided colitis without complications: Secondary | ICD-10-CM | POA: Diagnosis not present

## 2020-12-30 DIAGNOSIS — N368 Other specified disorders of urethra: Secondary | ICD-10-CM | POA: Diagnosis not present

## 2020-12-30 DIAGNOSIS — N321 Vesicointestinal fistula: Secondary | ICD-10-CM | POA: Diagnosis not present

## 2021-01-12 ENCOUNTER — Other Ambulatory Visit: Payer: Self-pay | Admitting: Cardiology

## 2021-01-18 ENCOUNTER — Ambulatory Visit: Payer: Medicare Other | Admitting: Cardiology

## 2021-01-19 DIAGNOSIS — K7460 Unspecified cirrhosis of liver: Secondary | ICD-10-CM | POA: Diagnosis not present

## 2021-01-19 DIAGNOSIS — J029 Acute pharyngitis, unspecified: Secondary | ICD-10-CM | POA: Diagnosis not present

## 2021-01-19 DIAGNOSIS — Z299 Encounter for prophylactic measures, unspecified: Secondary | ICD-10-CM | POA: Diagnosis not present

## 2021-01-19 DIAGNOSIS — U071 COVID-19: Secondary | ICD-10-CM | POA: Diagnosis not present

## 2021-01-23 ENCOUNTER — Emergency Department (HOSPITAL_COMMUNITY): Payer: Medicare Other

## 2021-01-23 ENCOUNTER — Encounter (HOSPITAL_COMMUNITY): Payer: Self-pay | Admitting: Emergency Medicine

## 2021-01-23 ENCOUNTER — Other Ambulatory Visit: Payer: Self-pay

## 2021-01-23 ENCOUNTER — Observation Stay (HOSPITAL_COMMUNITY)
Admission: EM | Admit: 2021-01-23 | Discharge: 2021-01-25 | Disposition: A | Discharge status: Home / Self Care | Payer: Medicare Other | Attending: Internal Medicine | Admitting: Internal Medicine

## 2021-01-23 DIAGNOSIS — E876 Hypokalemia: Secondary | ICD-10-CM | POA: Insufficient documentation

## 2021-01-23 DIAGNOSIS — Z8719 Personal history of other diseases of the digestive system: Secondary | ICD-10-CM

## 2021-01-23 DIAGNOSIS — I214 Non-ST elevation (NSTEMI) myocardial infarction: Secondary | ICD-10-CM | POA: Diagnosis present

## 2021-01-23 DIAGNOSIS — I2510 Atherosclerotic heart disease of native coronary artery without angina pectoris: Secondary | ICD-10-CM | POA: Diagnosis not present

## 2021-01-23 DIAGNOSIS — J45909 Unspecified asthma, uncomplicated: Secondary | ICD-10-CM | POA: Diagnosis not present

## 2021-01-23 DIAGNOSIS — E871 Hypo-osmolality and hyponatremia: Secondary | ICD-10-CM | POA: Diagnosis not present

## 2021-01-23 DIAGNOSIS — K219 Gastro-esophageal reflux disease without esophagitis: Secondary | ICD-10-CM | POA: Diagnosis not present

## 2021-01-23 DIAGNOSIS — Z7901 Long term (current) use of anticoagulants: Secondary | ICD-10-CM | POA: Diagnosis not present

## 2021-01-23 DIAGNOSIS — R0789 Other chest pain: Secondary | ICD-10-CM | POA: Diagnosis not present

## 2021-01-23 DIAGNOSIS — N400 Enlarged prostate without lower urinary tract symptoms: Secondary | ICD-10-CM

## 2021-01-23 DIAGNOSIS — E782 Mixed hyperlipidemia: Secondary | ICD-10-CM | POA: Diagnosis not present

## 2021-01-23 DIAGNOSIS — I2699 Other pulmonary embolism without acute cor pulmonale: Secondary | ICD-10-CM | POA: Diagnosis present

## 2021-01-23 DIAGNOSIS — U071 COVID-19: Secondary | ICD-10-CM | POA: Diagnosis not present

## 2021-01-23 DIAGNOSIS — Z79899 Other long term (current) drug therapy: Secondary | ICD-10-CM | POA: Insufficient documentation

## 2021-01-23 DIAGNOSIS — R079 Chest pain, unspecified: Secondary | ICD-10-CM | POA: Diagnosis not present

## 2021-01-23 DIAGNOSIS — D869 Sarcoidosis, unspecified: Secondary | ICD-10-CM | POA: Diagnosis present

## 2021-01-23 DIAGNOSIS — I10 Essential (primary) hypertension: Secondary | ICD-10-CM | POA: Diagnosis not present

## 2021-01-23 HISTORY — DX: Essential (primary) hypertension: I10

## 2021-01-23 HISTORY — DX: Acute myocardial infarction, unspecified: I21.9

## 2021-01-23 LAB — COMPREHENSIVE METABOLIC PANEL
ALT: 27 U/L (ref 0–44)
AST: 31 U/L (ref 15–41)
Albumin: 3.8 g/dL (ref 3.5–5.0)
Alkaline Phosphatase: 113 U/L (ref 38–126)
Anion gap: 9 (ref 5–15)
BUN: 12 mg/dL (ref 8–23)
CO2: 26 mmol/L (ref 22–32)
Calcium: 8.4 mg/dL — ABNORMAL LOW (ref 8.9–10.3)
Chloride: 82 mmol/L — ABNORMAL LOW (ref 98–111)
Creatinine, Ser: 0.68 mg/dL (ref 0.61–1.24)
GFR, Estimated: >60 mL/min (ref >60)
Glucose, Bld: 104 mg/dL — ABNORMAL HIGH (ref 70–99)
Potassium: 3.3 mmol/L — ABNORMAL LOW (ref 3.5–5.1)
Sodium: 117 mmol/L — CL (ref 135–145)
Total Bilirubin: 0.8 mg/dL (ref 0.3–1.2)
Total Protein: 7.3 g/dL (ref 6.5–8.1)

## 2021-01-23 LAB — CBC WITH DIFFERENTIAL/PLATELET
Abs Immature Granulocytes: 0.01 10*3/uL (ref 0.00–0.07)
Basophils Absolute: 0 10*3/uL (ref 0.0–0.1)
Basophils Relative: 0 %
Eosinophils Absolute: 0.1 10*3/uL (ref 0.0–0.5)
Eosinophils Relative: 3 %
HCT: 33.1 % — ABNORMAL LOW (ref 39.0–52.0)
Hemoglobin: 10.7 g/dL — ABNORMAL LOW (ref 13.0–17.0)
Immature Granulocytes: 0 %
Lymphocytes Relative: 35 %
Lymphs Abs: 1.6 10*3/uL (ref 0.7–4.0)
MCH: 26.2 pg (ref 26.0–34.0)
MCHC: 32.3 g/dL (ref 30.0–36.0)
MCV: 81.1 fL (ref 80.0–100.0)
Monocytes Absolute: 0.5 10*3/uL (ref 0.1–1.0)
Monocytes Relative: 10 %
Neutro Abs: 2.3 10*3/uL (ref 1.7–7.7)
Neutrophils Relative %: 52 %
Platelets: 296 10*3/uL (ref 150–400)
RBC: 4.08 MIL/uL — ABNORMAL LOW (ref 4.22–5.81)
RDW: 15.3 % (ref 11.5–15.5)
WBC: 4.5 10*3/uL (ref 4.0–10.5)
nRBC: 0 % (ref 0.0–0.2)

## 2021-01-23 LAB — TROPONIN I (HIGH SENSITIVITY)
Troponin I (High Sensitivity): 7 ng/L (ref <18)
Troponin I (High Sensitivity): 7 ng/L (ref <18)

## 2021-01-23 LAB — LIPASE, BLOOD: Lipase: 43 U/L (ref 11–51)

## 2021-01-23 LAB — RESP PANEL BY RT-PCR (FLU A&B, COVID) ARPGX2
Influenza A by PCR: NEGATIVE
Influenza B by PCR: NEGATIVE
SARS Coronavirus 2 by RT PCR: POSITIVE — AB

## 2021-01-23 MED ORDER — ALUM & MAG HYDROXIDE-SIMETH 200-200-20 MG/5ML PO SUSP
30.0000 mL | Freq: Once | ORAL | Status: AC
  Administered 2021-01-23: 30 mL via ORAL
  Filled 2021-01-23: qty 30

## 2021-01-23 MED ORDER — ZINC SULFATE 220 (50 ZN) MG PO CAPS
220.0000 mg | ORAL_CAPSULE | Freq: Every day | ORAL | Status: DC
  Administered 2021-01-23 – 2021-01-25 (×3): 220 mg via ORAL
  Filled 2021-01-23 (×3): qty 1

## 2021-01-23 MED ORDER — ENOXAPARIN SODIUM 40 MG/0.4ML IJ SOSY: 40.0000 mg | PREFILLED_SYRINGE | INTRAMUSCULAR | Status: DC

## 2021-01-23 MED ORDER — ASCORBIC ACID 500 MG PO TABS
500.0000 mg | ORAL_TABLET | Freq: Every day | ORAL | Status: DC
  Administered 2021-01-23 – 2021-01-25 (×4): 500 mg via ORAL
  Filled 2021-01-23 (×3): qty 1

## 2021-01-23 MED ORDER — ACETAMINOPHEN 325 MG PO TABS: 650.0000 mg | ORAL_TABLET | Freq: Four times a day (QID) | ORAL | Status: DC | PRN

## 2021-01-23 MED ORDER — PANTOPRAZOLE SODIUM 40 MG IV SOLR
40.0000 mg | Freq: Once | INTRAVENOUS | Status: AC
  Administered 2021-01-23: 40 mg via INTRAVENOUS
  Filled 2021-01-23: qty 40

## 2021-01-23 MED ORDER — ONDANSETRON HCL 4 MG/2ML IJ SOLN
4.0000 mg | Freq: Four times a day (QID) | INTRAMUSCULAR | Status: DC | PRN
  Administered 2021-01-23: 4 mg via INTRAVENOUS
  Filled 2021-01-23: qty 2

## 2021-01-23 MED ORDER — LIDOCAINE VISCOUS HCL 2 % MT SOLN
15.0000 mL | Freq: Once | OROMUCOSAL | Status: AC
  Administered 2021-01-23: 15 mL via ORAL
  Filled 2021-01-23: qty 15

## 2021-01-23 MED ORDER — SODIUM CHLORIDE 0.9 % IV BOLUS
500.0000 mL | Freq: Once | INTRAVENOUS | Status: AC
  Administered 2021-01-23: 500 mL via INTRAVENOUS

## 2021-01-23 MED ORDER — PANTOPRAZOLE SODIUM 40 MG PO TBEC
40.0000 mg | DELAYED_RELEASE_TABLET | Freq: Every day | ORAL | Status: DC
  Administered 2021-01-23 – 2021-01-25 (×4): 40 mg via ORAL
  Filled 2021-01-23 (×3): qty 1

## 2021-01-23 NOTE — ED Notes (Signed)
Date and time results received: 01/23/21 0805   Test: 117 Critical Value: Sodium  Name of Provider Notified: [place]  Orders Received? Or Actions Taken?: NA

## 2021-01-23 NOTE — ED Provider Notes (Signed)
[facility] EMERGENCY DEPARTMENT Provider Note   CSN: [id number] Arrival date & time: 01/23/21  1837     History Chief Complaint  Patient presents with  . Chest Pain    Richard Webb is a 75 y.o. male.  Patient complains of some epigastric discomfort.  Patient has history of an MI.  Also patient started with COVID symptoms on Wednesday and has a positive home test patient has had sore throat mild cough  The history is provided by the patient and medical records. No language interpreter was used.  Chest Pain Pain location:  Epigastric Pain quality: aching   Pain radiates to:  Does not radiate Pain severity:  Moderate Onset quality:  Sudden Timing:  Constant Progression:  Waxing and waning Chronicity:  New Context: not breathing   Relieved by:  Nothing Worsened by:  Nothing Ineffective treatments:  None tried Associated symptoms: cough and fatigue   Associated symptoms: no abdominal pain, no back pain and no headache        Past Medical History:  Diagnosis Date  . Arthritis   . Asthma   . Coronary atherosclerosis of native coronary artery    a. DES x 3 RCA and DES mCx 10/11 - [place] - with residual 50-70% stenosis in the distal Cx in AV groove, 70% distal LAD beyond apex, 70% small D1. b. 12/2017: similar results with multivessel CAD. Patent stents. Medical management recommended.   . Diverticulitis   . GERD (gastroesophageal reflux disease)   . History of kidney stones   . Mixed hyperlipidemia   . Obstructive sleep apnea   . Pulmonary hypertension (HCC)    PASP 50 mmHg  . Restless leg syndrome   . Sarcoidosis   . Statin intolerance   . Ulcerative colitis [facility])     Patient Active Problem List   Diagnosis Date Noted  . Intra-abdominal abscess ([place]) 11/02/2020  . Pulmonary embolism ([place]) 10/18/2020  . Essential hypertension 10/17/2020  . History of ulcerative colitis 10/17/2020  . GERD (gastroesophageal reflux disease) 10/17/2020  . Asthma 10/17/2020   . Sarcoidosis 10/17/2020  . History of diverticulitis 10/17/2020  . Hyperglycemia 10/17/2020  . NSTEMI (non-ST elevated myocardial infarction) ([place]) 10/17/2020  . Diverticulitis of large intestine with abscess 04/17/2020  . Acute diverticulitis 04/17/2020  . BPH (benign prostatic hyperplasia) 03/18/2020  . Gross hematuria 03/18/2020  . OAB (overactive bladder) 03/18/2020  . Dyspnea on exertion 07/21/2019  . Accelerating angina ([place])   . Coronary artery disease 10/10/2012  . Mixed hyperlipidemia 10/10/2012    Past Surgical History:  Procedure Laterality Date  . Ankle fracture repair Left 1985  . CARDIAC CATHETERIZATION     stents x4  . CHOLECYSTECTOMY  1970  . CORONARY STENT INTERVENTION N/A 10/19/2020   Procedure: CORONARY STENT INTERVENTION;  Surgeon: Troy Sine, MD;  Location: [facility];  Service: Cardiovascular;  Laterality: N/A;  . CYSTOSCOPY WITH INSERTION OF UROLIFT N/A 08/27/2017   Procedure: CYSTOSCOPY WITH INSERTION OF UROLIFT;  Surgeon: Cleon Gustin, MD;  Location: [facility];  Service: Urology;  Laterality: N/A;  . LAPAROSCOPIC APPENDECTOMY  2013  . LEFT HEART CATH AND CORONARY ANGIOGRAPHY N/A 01/02/2018   Procedure: LEFT HEART CATH AND CORONARY ANGIOGRAPHY;  Surgeon: Troy Sine, MD;  Location: [facility];  Service: Cardiovascular;  Laterality: N/A;  . LEFT HEART CATH AND CORONARY ANGIOGRAPHY N/A 10/19/2020   Procedure: LEFT HEART CATH AND CORONARY ANGIOGRAPHY;  Surgeon: Troy Sine, MD;  Location: [facility]  [facility];  Service: Cardiovascular;  Laterality: N/A;  . Right total knee replacement Bilateral 2005       Family History  Problem Relation Age of Onset  . Colon cancer Mother   . CAD Father     Social History   Tobacco Use  . Smoking status: Never Smoker  . Smokeless tobacco: Never Used  Vaping Use  . Vaping Use: Never used  Substance Use Topics  . Alcohol use: No  . Drug use: No    Home Medications Prior to Admission  medications   Medication Sig Start Date End Date Taking? Authorizing Provider  acidophilus (RISAQUAD) CAPS capsule Take 1 capsule by mouth daily. 11/05/20   Thurnell Lose, MD  amLODipine (NORVASC) 10 MG tablet Take 10 mg by mouth daily. 09/21/20   [provider]  amoxicillin (AMOXIL) 500 MG capsule Take 1,500 mg by mouth once as needed. Take before dental appointment    [provider]  apixaban (ELIQUIS) 5 MG TABS tablet Take 2 tablets twice daily until 3/7. On 3/8 start taking 1 tablet twice daily . 10/21/20   Dwyane Dee, MD  BREO ELLIPTA 200-25 MCG/INH AEPB Inhale 1 Inhaler into the lungs daily. 12/25/17   [provider]  carbidopa-levodopa (SINEMET IR) 10-100 MG per tablet Take 1 tablet by mouth at bedtime.    [provider]  clopidogrel (PLAVIX) 75 MG tablet Take 1 tablet (75 mg total) by mouth daily. 10/21/20   Dwyane Dee, MD  cyanocobalamin (,VITAMIN B-12,) 1000 MCG/ML injection Inject 1,000 mcg into the muscle every 30 (thirty) days. 08/10/20   [provider]  Dupilumab 300 MG/2ML SOPN Inject 300 mg into the skin every 14 (fourteen) days.    [provider]  Evolocumab (REPATHA SURECLICK) 701 MG/ML SOAJ Inject 1 Dose into the skin every 14 (fourteen) days. <PLEASE MAKE APPOINTMENT FOR REFILLS> 11/15/20   Hilty, Nadean Corwin, MD  famotidine (PEPCID) 20 MG tablet Take 1 tablet (20 mg total) by mouth every evening. 10/21/20 10/21/21  Dwyane Dee, MD  famotidine (PEPCID) 20 MG tablet TAKE 1 TABLET (20 MG TOTAL) BY MOUTH EVERY EVENING. 10/21/20 10/21/21  Dwyane Dee, MD  finasteride (PROSCAR) 5 MG tablet Take 1 tablet (5 mg total) by mouth daily. 09/22/20   McKenzie, Candee Furbish, MD  folic acid (FOLVITE) 1 MG tablet Take 1 mg by mouth See admin instructions. Mon - Fri    [provider]  hydrochlorothiazide (HYDRODIURIL) 25 MG tablet Take 25 mg by mouth daily.    [provider]  HYDROcodone-acetaminophen (NORCO/VICODIN) 5-325  MG tablet Take 1 tablet by mouth every 6 (six) hours as needed for severe pain. 08/06/20   [provider]  isosorbide mononitrate (IMDUR) 30 MG 24 hr tablet TAKE 1 TABLET BY MOUTH  DAILY Patient taking differently: Take 30 mg by mouth daily. 02/24/20   Satira Sark, MD  losartan (COZAAR) 50 MG tablet Take 50 mg by mouth daily. 08/03/20   [provider]  metoprolol tartrate (LOPRESSOR) 25 MG tablet Take 1 tablet (25 mg total) by mouth 2 (two) times daily. 10/21/20   Dwyane Dee, MD  metoprolol tartrate (LOPRESSOR) 25 MG tablet TAKE ONE-HALF TABLET BY  MOUTH TWICE DAILY 01/13/21   Satira Sark, MD  metoprolol tartrate (LOPRESSOR) 25 MG tablet TAKE 1 TABLET (25 MG TOTAL) BY MOUTH TWO TIMES DAILY. 10/21/20 10/21/21  Dwyane Dee, MD  montelukast (SINGULAIR) 10 MG tablet Take 10 mg by mouth at bedtime.  [provider]  nitroGLYCERIN (NITROSTAT) 0.4 MG SL tablet Place 1 tablet (0.4 mg total) under the tongue every 5 (five) minutes x 3 doses as needed for chest pain (if no relief after 3rd dose, proceed to the ED for an evaluation or call 911). 06/26/19   Satira Sark, MD  omeprazole (PRILOSEC) 20 MG capsule Take 20 mg by mouth daily.    [provider]  oxymetazoline (AFRIN) 0.05 % nasal spray Place 1 spray into both nostrils 2 (two) times daily as needed for congestion.    [provider]  penicillin v potassium (VEETID) 500 MG tablet Take by mouth See admin instructions. TAKE 2 TABLETS BY MOUTH STAT THEN 1 TABLET FOUR TIMES DAILY FOR 7 DAYS 10/14/20   [provider]  potassium chloride (KLOR-CON) 10 MEQ tablet Take 10 mEq by mouth daily.    [provider]  sulfaSALAzine (AZULFIDINE) 500 MG tablet Take 1,000 mg by mouth 2 (two) times daily.    [provider]  temazepam (RESTORIL) 30 MG capsule Take 30 mg by mouth at bedtime as needed for sleep.     [provider]  Vitamin D, Ergocalciferol, (DRISDOL) 50000  UNITS CAPS capsule Take 50,000 Units by mouth every 7 (seven) days. Thursdays    [provider]    Allergies    Bee venom and Statins  Review of Systems   Review of Systems  Constitutional: Positive for fatigue. Negative for appetite change.  HENT: Negative for congestion, ear discharge and sinus pressure.   Eyes: Negative for discharge.  Respiratory: Positive for cough.   Cardiovascular: Positive for chest pain.  Gastrointestinal: Negative for abdominal pain and diarrhea.  Genitourinary: Negative for frequency and hematuria.  Musculoskeletal: Negative for back pain.  Skin: Negative for rash.  Neurological: Negative for seizures and headaches.  Psychiatric/Behavioral: Negative for hallucinations.    Physical Exam Updated Vital Signs BP (!) 144/69   Pulse (!) 56   Temp 98.8 F (37.1 C) (Oral)   Resp 18   Ht 6' (1.829 m)   Wt 90.3 kg   SpO2 98%   BMI 26.99 kg/m   Physical Exam Vitals and nursing note reviewed.  Constitutional:      Appearance: He is well-developed.  HENT:     Head: Normocephalic.     Nose: Nose normal.  Eyes:     General: No scleral icterus.    Conjunctiva/sclera: Conjunctivae normal.  Neck:     Thyroid: No thyromegaly.  Cardiovascular:     Rate and Rhythm: Normal rate and regular rhythm.     Heart sounds: No murmur heard. No friction rub. No gallop.   Pulmonary:     Breath sounds: No stridor. No wheezing or rales.  Chest:     Chest wall: No tenderness.  Abdominal:     General: There is no distension.     Tenderness: There is abdominal tenderness. There is no rebound.  Musculoskeletal:        General: Normal range of motion.     Cervical back: Neck supple.  Lymphadenopathy:     Cervical: No cervical adenopathy.  Skin:    Findings: No erythema or rash.  Neurological:     Mental Status: He is oriented to person, place, and time.     Motor: No abnormal muscle tone.     Coordination: Coordination normal.  Psychiatric:         Behavior: Behavior normal.     ED Results / Procedures /  Treatments   Labs (all labs ordered are listed, but only abnormal results are displayed) Labs Reviewed  RESP PANEL BY RT-PCR (FLU A&B, COVID) ARPGX2 - Abnormal; Notable for the following components:      Result Value   SARS Coronavirus 2 by RT PCR POSITIVE (*)    All other components within normal limits  CBC WITH DIFFERENTIAL/PLATELET - Abnormal; Notable for the following components:   RBC 4.08 (*)    Hemoglobin 10.7 (*)    HCT 33.1 (*)    All other components within normal limits  COMPREHENSIVE METABOLIC PANEL - Abnormal; Notable for the following components:   Sodium 117 (*)    Potassium 3.3 (*)    Chloride 82 (*)    Glucose, Bld 104 (*)    Calcium 8.4 (*)    All other components within normal limits  LIPASE, BLOOD  TROPONIN I (HIGH SENSITIVITY)  TROPONIN I (HIGH SENSITIVITY)    EKG EKG Interpretation  Date/Time:  Sunday January 23 2021 18:46:44 EDT Ventricular Rate:  55 PR Interval:  279 QRS Duration: 121 QT Interval:  487 QTC Calculation: 466 R Axis:   40 Text Interpretation: Sinus rhythm Prolonged PR interval Nonspecific intraventricular conduction delay Baseline wander in lead(s) V1 Confirmed by Milton Ferguson [phone]) on 01/23/2021 7:17:10 PM   Radiology DG Chest Port 1 View  Result Date: 01/23/2021 CLINICAL DATA:  Chest pain and COVID-19 EXAM: PORTABLE CHEST 1 VIEW COMPARISON:  10/16/2020 FINDINGS: Bilateral apical predominant interstitial opacities compatible with known sarcoidosis, unchanged. No superimposed acute consolidation. No pleural effusion or pneumothorax. Normal cardiomediastinal contours. IMPRESSION: Chronic sarcoidosis without acute airspace disease. Electronically Signed   By: Ulyses Jarred M.D.   On: 01/23/2021 20:06    Procedures Procedures   Medications Ordered in ED Medications  sodium chloride 0.9 % bolus 500 mL (has no administration in time range)  alum & mag hydroxide-simeth  (MAALOX/MYLANTA) 200-200-20 MG/5ML suspension 30 mL (30 mLs Oral Given 01/23/21 1935)    And  lidocaine (XYLOCAINE) 2 % viscous mouth solution 15 mL (15 mLs Oral Given 01/23/21 1935)  pantoprazole (PROTONIX) injection 40 mg (40 mg Intravenous Given 01/23/21 1935)    ED Course  I have reviewed the triage vital signs and the nursing notes.  Pertinent labs & imaging results that were available during my care of the patient were reviewed by me and considered in my medical decision making (see chart for details). Patient epigastric discomfort was improved with Mylanta and viscous lidocaine.   MDM Rules/Calculators/A&P                         Patient will be admitted for hyponatremia.  He also has a recent COVID infection but is nontoxic from that Final Clinical Impression(s) / ED Diagnoses Final diagnoses:  Hyponatremia    Rx / DC Orders ED Discharge Orders    None       Milton Ferguson, MD 01/24/21 1029

## 2021-01-23 NOTE — ED Triage Notes (Signed)
Pt c/o chest pain that started about 2 hrs PTA. Pt also tested positive for COVID Thursday. Pt states he recently had a MI this past Feb.

## 2021-01-23 NOTE — ED Notes (Signed)
Date and time results received: 01/23/21 2115 (use smartphrase ".now" to insert current time)  Test: covid Critical Value: positive  Name of Provider Notified: Dr Roderic Palau  Orders Received? Or Actions Taken?: Actions Taken: no orders received

## 2021-01-24 ENCOUNTER — Encounter (HOSPITAL_COMMUNITY): Payer: Self-pay | Admitting: Internal Medicine

## 2021-01-24 DIAGNOSIS — D869 Sarcoidosis, unspecified: Secondary | ICD-10-CM | POA: Diagnosis not present

## 2021-01-24 DIAGNOSIS — E782 Mixed hyperlipidemia: Secondary | ICD-10-CM

## 2021-01-24 DIAGNOSIS — U071 COVID-19: Secondary | ICD-10-CM | POA: Diagnosis not present

## 2021-01-24 DIAGNOSIS — E871 Hypo-osmolality and hyponatremia: Secondary | ICD-10-CM

## 2021-01-24 DIAGNOSIS — I2510 Atherosclerotic heart disease of native coronary artery without angina pectoris: Secondary | ICD-10-CM | POA: Diagnosis not present

## 2021-01-24 LAB — URINALYSIS, COMPLETE (UACMP) WITH MICROSCOPIC
Bilirubin Urine: NEGATIVE
Glucose, UA: NEGATIVE mg/dL
Ketones, ur: NEGATIVE mg/dL
Nitrite: NEGATIVE
Protein, ur: NEGATIVE mg/dL
Specific Gravity, Urine: 1.004 — ABNORMAL LOW (ref 1.005–1.030)
pH: 7 (ref 5.0–8.0)

## 2021-01-24 LAB — CBC WITH DIFFERENTIAL/PLATELET
Abs Immature Granulocytes: 0.02 10*3/uL (ref 0.00–0.07)
Basophils Absolute: 0 10*3/uL (ref 0.0–0.1)
Basophils Relative: 0 %
Eosinophils Absolute: 0.1 10*3/uL (ref 0.0–0.5)
Eosinophils Relative: 3 %
HCT: 31.6 % — ABNORMAL LOW (ref 39.0–52.0)
Hemoglobin: 10.1 g/dL — ABNORMAL LOW (ref 13.0–17.0)
Immature Granulocytes: 1 %
Lymphocytes Relative: 23 %
Lymphs Abs: 1 10*3/uL (ref 0.7–4.0)
MCH: 25.9 pg — ABNORMAL LOW (ref 26.0–34.0)
MCHC: 32 g/dL (ref 30.0–36.0)
MCV: 81 fL (ref 80.0–100.0)
Monocytes Absolute: 0.4 10*3/uL (ref 0.1–1.0)
Monocytes Relative: 9 %
Neutro Abs: 2.7 10*3/uL (ref 1.7–7.7)
Neutrophils Relative %: 64 %
Platelets: 292 10*3/uL (ref 150–400)
RBC: 3.9 MIL/uL — ABNORMAL LOW (ref 4.22–5.81)
RDW: 15.1 % (ref 11.5–15.5)
WBC: 4.3 10*3/uL (ref 4.0–10.5)
nRBC: 0 % (ref 0.0–0.2)

## 2021-01-24 LAB — COMPREHENSIVE METABOLIC PANEL
ALT: 8 U/L (ref 0–44)
AST: 27 U/L (ref 15–41)
Albumin: 3.3 g/dL — ABNORMAL LOW (ref 3.5–5.0)
Alkaline Phosphatase: 98 U/L (ref 38–126)
Anion gap: 7 (ref 5–15)
BUN: 10 mg/dL (ref 8–23)
CO2: 26 mmol/L (ref 22–32)
Calcium: 8.3 mg/dL — ABNORMAL LOW (ref 8.9–10.3)
Chloride: 87 mmol/L — ABNORMAL LOW (ref 98–111)
Creatinine, Ser: 0.7 mg/dL (ref 0.61–1.24)
GFR, Estimated: >60 mL/min (ref >60)
Glucose, Bld: 103 mg/dL — ABNORMAL HIGH (ref 70–99)
Potassium: 4.1 mmol/L (ref 3.5–5.1)
Sodium: 120 mmol/L — ABNORMAL LOW (ref 135–145)
Total Bilirubin: 0.6 mg/dL (ref 0.3–1.2)
Total Protein: 6.5 g/dL (ref 6.5–8.1)

## 2021-01-24 LAB — BASIC METABOLIC PANEL
Anion gap: 6 (ref 5–15)
BUN: 9 mg/dL (ref 8–23)
CO2: 25 mmol/L (ref 22–32)
Calcium: 8.2 mg/dL — ABNORMAL LOW (ref 8.9–10.3)
Chloride: 89 mmol/L — ABNORMAL LOW (ref 98–111)
Creatinine, Ser: 0.67 mg/dL (ref 0.61–1.24)
GFR, Estimated: >60 mL/min (ref >60)
Glucose, Bld: 102 mg/dL — ABNORMAL HIGH (ref 70–99)
Potassium: 3.7 mmol/L (ref 3.5–5.1)
Sodium: 120 mmol/L — ABNORMAL LOW (ref 135–145)

## 2021-01-24 LAB — SODIUM, URINE, RANDOM
Sodium, Ur: 11 mmol/L
Sodium, Ur: <10 mmol/L

## 2021-01-24 LAB — OSMOLALITY, URINE: Osmolality, Ur: 219 mOsm/kg — ABNORMAL LOW (ref 300–900)

## 2021-01-24 LAB — MAGNESIUM: Magnesium: 1.8 mg/dL (ref 1.7–2.4)

## 2021-01-24 LAB — D-DIMER, QUANTITATIVE: D-Dimer, Quant: 0.52 ug/mL-FEU — ABNORMAL HIGH (ref 0.00–0.50)

## 2021-01-24 LAB — C-REACTIVE PROTEIN: CRP: 0.7 mg/dL (ref <1.0)

## 2021-01-24 LAB — PHOSPHORUS: Phosphorus: 2.3 mg/dL — ABNORMAL LOW (ref 2.5–4.6)

## 2021-01-24 LAB — FERRITIN: Ferritin: 337 ng/mL — ABNORMAL HIGH (ref 24–336)

## 2021-01-24 LAB — CREATININE, URINE, RANDOM: Creatinine, Urine: 29.68 mg/dL

## 2021-01-24 MED ORDER — CARBIDOPA-LEVODOPA 10-100 MG PO TABS
1.0000 | ORAL_TABLET | Freq: Three times a day (TID) | ORAL | Status: DC | PRN
  Administered 2021-01-24: 1 via ORAL
  Filled 2021-01-24 (×2): qty 1

## 2021-01-24 MED ORDER — FLUTICASONE FUROATE-VILANTEROL 200-25 MCG/INH IN AEPB
1.0000 | INHALATION_SPRAY | Freq: Every day | RESPIRATORY_TRACT | Status: DC
  Administered 2021-01-24 – 2021-01-25 (×2): 1 via RESPIRATORY_TRACT
  Filled 2021-01-24: qty 28

## 2021-01-24 MED ORDER — SODIUM CHLORIDE 0.9 % IV SOLN: Freq: Once | INTRAVENOUS | Status: AC

## 2021-01-24 MED ORDER — CARBIDOPA-LEVODOPA 10-100 MG PO TABS
1.0000 | ORAL_TABLET | ORAL | Status: DC
  Administered 2021-01-24: 1 via ORAL
  Filled 2021-01-24 (×2): qty 1

## 2021-01-24 MED ORDER — APIXABAN 5 MG PO TABS
5.0000 mg | ORAL_TABLET | Freq: Two times a day (BID) | ORAL | Status: DC
  Administered 2021-01-24 – 2021-01-25 (×3): 5 mg via ORAL
  Filled 2021-01-24 (×3): qty 1

## 2021-01-24 MED ORDER — ISOSORBIDE MONONITRATE ER 60 MG PO TB24
30.0000 mg | ORAL_TABLET | Freq: Every day | ORAL | Status: DC
  Administered 2021-01-24 – 2021-01-25 (×2): 30 mg via ORAL
  Filled 2021-01-24 (×2): qty 1

## 2021-01-24 MED ORDER — POTASSIUM CHLORIDE IN NACL 20-0.9 MEQ/L-% IV SOLN: INTRAVENOUS | Status: DC

## 2021-01-24 MED ORDER — TEMAZEPAM 15 MG PO CAPS
30.0000 mg | ORAL_CAPSULE | Freq: Every evening | ORAL | Status: DC | PRN
  Administered 2021-01-24: 30 mg via ORAL
  Filled 2021-01-24: qty 2

## 2021-01-24 MED ORDER — FINASTERIDE 5 MG PO TABS
5.0000 mg | ORAL_TABLET | Freq: Every day | ORAL | Status: DC
  Administered 2021-01-24 – 2021-01-25 (×2): 5 mg via ORAL
  Filled 2021-01-24 (×2): qty 1

## 2021-01-24 MED ORDER — LOSARTAN POTASSIUM 50 MG PO TABS
50.0000 mg | ORAL_TABLET | Freq: Every day | ORAL | Status: DC
  Administered 2021-01-24 – 2021-01-25 (×2): 50 mg via ORAL
  Filled 2021-01-24 (×2): qty 1

## 2021-01-24 MED ORDER — POTASSIUM CHLORIDE CRYS ER 20 MEQ PO TBCR
40.0000 meq | EXTENDED_RELEASE_TABLET | Freq: Once | ORAL | Status: AC
  Administered 2021-01-24: 40 meq via ORAL
  Filled 2021-01-24: qty 2

## 2021-01-24 MED ORDER — FAMOTIDINE 20 MG PO TABS
20.0000 mg | ORAL_TABLET | Freq: Every evening | ORAL | Status: DC
  Administered 2021-01-24: 20 mg via ORAL
  Filled 2021-01-24: qty 1

## 2021-01-24 MED ORDER — CLOPIDOGREL BISULFATE 75 MG PO TABS
75.0000 mg | ORAL_TABLET | Freq: Every day | ORAL | Status: DC
  Administered 2021-01-24 – 2021-01-25 (×2): 75 mg via ORAL
  Filled 2021-01-24 (×2): qty 1

## 2021-01-24 MED ORDER — METOPROLOL TARTRATE 25 MG PO TABS
25.0000 mg | ORAL_TABLET | Freq: Two times a day (BID) | ORAL | Status: DC
  Filled 2021-01-24: qty 1

## 2021-01-24 MED ORDER — MONTELUKAST SODIUM 10 MG PO TABS
10.0000 mg | ORAL_TABLET | Freq: Every day | ORAL | Status: DC
  Administered 2021-01-24: 10 mg via ORAL
  Filled 2021-01-24: qty 1

## 2021-01-24 MED ORDER — CARBIDOPA-LEVODOPA 10-100 MG PO TABS
1.0000 | ORAL_TABLET | Freq: Every day | ORAL | Status: DC
Start: 2021-01-24 — End: 2021-01-24
  Administered 2021-01-24: 1 via ORAL
  Filled 2021-01-24 (×2): qty 1

## 2021-01-24 MED ORDER — SULFASALAZINE 500 MG PO TABS
1000.0000 mg | ORAL_TABLET | Freq: Two times a day (BID) | ORAL | Status: DC
  Administered 2021-01-24 – 2021-01-25 (×3): 1000 mg via ORAL
  Filled 2021-01-24 (×5): qty 2

## 2021-01-24 MED ORDER — SALINE SPRAY 0.65 % NA SOLN
1.0000 | NASAL | Status: DC | PRN
  Administered 2021-01-24: 1 via NASAL
  Filled 2021-01-24: qty 44

## 2021-01-24 MED ORDER — METOPROLOL TARTRATE 25 MG PO TABS
12.5000 mg | ORAL_TABLET | Freq: Two times a day (BID) | ORAL | Status: DC
  Administered 2021-01-24 – 2021-01-25 (×2): 12.5 mg via ORAL
  Filled 2021-01-24 (×2): qty 1

## 2021-01-24 NOTE — Care Management Obs Status (Signed)
[facility] NOTIFICATION   Patient Details  Name: Richard Webb MRN: [id number] Date of Birth: 1946/02/18   Medicare Observation Status Notification Given:  Yes    Natasha Bence, LCSW 01/24/2021, 5:03 PM

## 2021-01-24 NOTE — H&P (Signed)
History and Physical  Richard Webb PVV:[id number] DOB: 01-04-1946 DOA: 01/23/2021  Referring physician: Milton Ferguson, MD PCP: [name], MD  Patient coming from: Home  Chief Complaint: Epigastric discomfort  HPI: Richard Webb is a 75 y.o. male with medical history significant for patient hypertension, NSTEMI  s/p stent placement, pulmonary embolism, diverticulitis, GERD, sarcoidosis who presents to the emergency department due to epigastric discomfort initially referred to as chest pain which started earlier today.  Patient states that he was having upper respiratory symptoms including chest congestion, cough with initial production of yellowish phlegm which has since changed to clear phlegm, subjective fever, sore throat, chills, headache since last couple of days, so he went to his PCP on Wednesday (6/1) who diagnosed him to have bronchitis, an injection was given in the doctor's office and patient was prescribed with azithromycin.  He was also asked to do a home COVID test which was positive, he states that he has been self quarantining at home, he denies loss of taste or loss of smell and he states that he got the 2 COVID vaccines and a booster.  Wife died of COVID-19 virus infection in January of this year.  Abdominal pain was epigastric, it was rated as 4/10 on pain scale, it was nonradiating and be pain was different from pain he had when he had a heart attack, but he took nitroglycerin with minimal improvement in pain, he decided to go to the ED for further evaluation.  ED Course:  In the emergency department, he was bradycardic in the 50s, other vital signs were normal.  Work-up in the ED showed normocytic anemia, troponin x2 was flat at 7.  Influenza A and B were negative.  SARS coronavirus 2 was positive. Chest x-ray showed chronic sarcoidosis without acute airspace disease IV Protonix was given with improvement in the epigastric pain.  Zofran was given and patient was also  provided with GI cocktail.  Hospitalist was asked to admit patient for further evaluation and management.  Review of Systems: Constitutional: Positive for chills and subjective fever.  HENT: Negative for ear pain and sore throat.   Eyes: Negative for pain and visual disturbance.  Respiratory: Positive for cough,  and chest congestion.  Negative for shortness of breath.   Cardiovascular: Negative for chest pain and palpitations.  Gastrointestinal: Positive for abdominal pain and nausea.  Endocrine: Negative for polyphagia and polyuria.  Genitourinary: Negative for decreased urine volume, dysuria, enuresis Musculoskeletal: Negative for arthralgias and back pain.  Skin: Negative for color change and rash.  Allergic/Immunologic: Negative for immunocompromised state.  Neurological: Negative for tremors, syncope, speech difficulty, weakness, light-headedness and headaches.  Hematological: Does not bruise/bleed easily.  All other systems reviewed and are negative   Past Medical History:  Diagnosis Date  . Arthritis   . Asthma   . Coronary atherosclerosis of native coronary artery    a. DES x 3 RCA and DES mCx 10/11 - [place] - with residual 50-70% stenosis in the distal Cx in AV groove, 70% distal LAD beyond apex, 70% small D1. b. 12/2017: similar results with multivessel CAD. Patent stents. Medical management recommended.   . Diverticulitis   . GERD (gastroesophageal reflux disease)   . History of kidney stones   . Mixed hyperlipidemia   . Obstructive sleep apnea   . Pulmonary hypertension (HCC)    PASP 50 mmHg  . Restless leg syndrome   . Sarcoidosis   . Statin intolerance   . Ulcerative colitis ([place])  Past Surgical History:  Procedure Laterality Date  . Ankle fracture repair Left 1985  . CARDIAC CATHETERIZATION     stents x4  . CHOLECYSTECTOMY  1970  . CORONARY STENT INTERVENTION N/A 10/19/2020   Procedure: CORONARY STENT INTERVENTION;  Surgeon: Troy Sine, MD;   Location: [facility];  Service: Cardiovascular;  Laterality: N/A;  . CYSTOSCOPY WITH INSERTION OF UROLIFT N/A 08/27/2017   Procedure: CYSTOSCOPY WITH INSERTION OF UROLIFT;  Surgeon: Cleon Gustin, MD;  Location: [facility];  Service: Urology;  Laterality: N/A;  . LAPAROSCOPIC APPENDECTOMY  2013  . LEFT HEART CATH AND CORONARY ANGIOGRAPHY N/A 01/02/2018   Procedure: LEFT HEART CATH AND CORONARY ANGIOGRAPHY;  Surgeon: Troy Sine, MD;  Location: [facility];  Service: Cardiovascular;  Laterality: N/A;  . LEFT HEART CATH AND CORONARY ANGIOGRAPHY N/A 10/19/2020   Procedure: LEFT HEART CATH AND CORONARY ANGIOGRAPHY;  Surgeon: Troy Sine, MD;  Location: [facility];  Service: Cardiovascular;  Laterality: N/A;  . Right total knee replacement Bilateral 2005    Social History:  reports that he has never smoked. He has never used smokeless tobacco. He reports that he does not drink alcohol and does not use drugs.   Allergies  Allergen Reactions  . Bee Venom Anaphylaxis  . Statins Other (See Comments)    Myalgias - Simvastatin and Rosuvastatin    Family History  Problem Relation Age of Onset  . Colon cancer Mother   . CAD Father      Prior to Admission medications   Medication Sig Start Date End Date Taking? Authorizing Provider  acidophilus (RISAQUAD) CAPS capsule Take 1 capsule by mouth daily. 11/05/20   Thurnell Lose, MD  amLODipine (NORVASC) 10 MG tablet Take 10 mg by mouth daily. 09/21/20   [provider]  amoxicillin (AMOXIL) 500 MG capsule Take 1,500 mg by mouth once as needed. Take before dental appointment    [provider]  apixaban (ELIQUIS) 5 MG TABS tablet Take 2 tablets twice daily until 3/7. On 3/8 start taking 1 tablet twice daily . 10/21/20   Dwyane Dee, MD  BREO ELLIPTA 200-25 MCG/INH AEPB Inhale 1 Inhaler into the lungs daily. 12/25/17   [provider]  carbidopa-levodopa (SINEMET IR) 10-100 MG per tablet Take 1  tablet by mouth at bedtime.    [provider]  clopidogrel (PLAVIX) 75 MG tablet Take 1 tablet (75 mg total) by mouth daily. 10/21/20   Dwyane Dee, MD  cyanocobalamin (,VITAMIN B-12,) 1000 MCG/ML injection Inject 1,000 mcg into the muscle every 30 (thirty) days. 08/10/20   [provider]  Dupilumab 300 MG/2ML SOPN Inject 300 mg into the skin every 14 (fourteen) days.    [provider]  Evolocumab (REPATHA SURECLICK) 277 MG/ML SOAJ Inject 1 Dose into the skin every 14 (fourteen) days. <PLEASE MAKE APPOINTMENT FOR REFILLS> 11/15/20   Hilty, Nadean Corwin, MD  famotidine (PEPCID) 20 MG tablet Take 1 tablet (20 mg total) by mouth every evening. 10/21/20 10/21/21  Dwyane Dee, MD  famotidine (PEPCID) 20 MG tablet TAKE 1 TABLET (20 MG TOTAL) BY MOUTH EVERY EVENING. 10/21/20 10/21/21  Dwyane Dee, MD  finasteride (PROSCAR) 5 MG tablet Take 1 tablet (5 mg total) by mouth daily. 09/22/20   McKenzie, Candee Furbish, MD  folic acid (FOLVITE) 1 MG tablet Take 1 mg by mouth See admin instructions. Mon - Fri    [provider]  hydrochlorothiazide (HYDRODIURIL) 25 MG tablet Take  25 mg by mouth daily.    [provider]  HYDROcodone-acetaminophen (NORCO/VICODIN) 5-325 MG tablet Take 1 tablet by mouth every 6 (six) hours as needed for severe pain. 08/06/20   [provider]  isosorbide mononitrate (IMDUR) 30 MG 24 hr tablet TAKE 1 TABLET BY MOUTH  DAILY Patient taking differently: Take 30 mg by mouth daily. 02/24/20   Satira Sark, MD  losartan (COZAAR) 50 MG tablet Take 50 mg by mouth daily. 08/03/20   [provider]  metoprolol tartrate (LOPRESSOR) 25 MG tablet Take 1 tablet (25 mg total) by mouth 2 (two) times daily. 10/21/20   Dwyane Dee, MD  metoprolol tartrate (LOPRESSOR) 25 MG tablet TAKE ONE-HALF TABLET BY  MOUTH TWICE DAILY 01/13/21   Satira Sark, MD  metoprolol tartrate (LOPRESSOR) 25 MG tablet TAKE 1 TABLET (25 MG TOTAL) BY MOUTH TWO TIMES  DAILY. 10/21/20 10/21/21  Dwyane Dee, MD  montelukast (SINGULAIR) 10 MG tablet Take 10 mg by mouth at bedtime.    [provider]  nitroGLYCERIN (NITROSTAT) 0.4 MG SL tablet Place 1 tablet (0.4 mg total) under the tongue every 5 (five) minutes x 3 doses as needed for chest pain (if no relief after 3rd dose, proceed to the ED for an evaluation or call 911). 06/26/19   Satira Sark, MD  omeprazole (PRILOSEC) 20 MG capsule Take 20 mg by mouth daily.    [provider]  oxymetazoline (AFRIN) 0.05 % nasal spray Place 1 spray into both nostrils 2 (two) times daily as needed for congestion.    [provider]  penicillin v potassium (VEETID) 500 MG tablet Take by mouth See admin instructions. TAKE 2 TABLETS BY MOUTH STAT THEN 1 TABLET FOUR TIMES DAILY FOR 7 DAYS 10/14/20   [provider]  potassium chloride (KLOR-CON) 10 MEQ tablet Take 10 mEq by mouth daily.    [provider]  sulfaSALAzine (AZULFIDINE) 500 MG tablet Take 1,000 mg by mouth 2 (two) times daily.    [provider]  temazepam (RESTORIL) 30 MG capsule Take 30 mg by mouth at bedtime as needed for sleep.     [provider]  Vitamin D, Ergocalciferol, (DRISDOL) 50000 UNITS CAPS capsule Take 50,000 Units by mouth every 7 (seven) days. Thursdays    [provider]    Physical Exam: BP (!) 144/69   Pulse (!) 56   Temp 98.8 F (37.1 C) (Oral)   Resp 18   Ht 6' (1.829 m)   Wt 90.3 kg   SpO2 98%   BMI 26.99 kg/m   . General: 75 y.o. year-old male well developed well nourished in no acute distress.  Alert and oriented x3. Marland Kitchen HEENT: NCAT, EOMI . Neck: Supple, trachea medial . Cardiovascular: Regular rate and rhythm with no rubs or gallops.  No thyromegaly or JVD noted.  No lower extremity edema. 2/4 pulses in all 4 extremities. Marland Kitchen Respiratory: Clear to auscultation with no wheezes or rales. Good inspiratory effort. . Abdomen: Soft, tender to palpation of epigastric  area.  Nondistended with normal bowel sounds x4 quadrants. . Muskuloskeletal: No cyanosis, clubbing or edema noted bilaterally . Neuro: CN II-XII intact, strength 5/5 x 4, sensation, reflexes intact . Skin: No ulcerative lesions noted or rashes . Psychiatry: Judgement and insight appear normal. Mood is appropriate for condition and setting          Labs on Admission:  Basic Metabolic Panel: Recent Labs  Lab 01/23/21 1923  NA 117*  K 3.3*  CL 82*  CO2 26  GLUCOSE 104*  BUN 12  CREATININE 0.68  CALCIUM 8.4*   Liver Function Tests: Recent Labs  Lab 01/23/21 1923  AST 31  ALT 27  ALKPHOS 113  BILITOT 0.8  PROT 7.3  ALBUMIN 3.8   Recent Labs  Lab 01/23/21 1923  LIPASE 43   No results for input(s): AMMONIA in the last 168 hours. CBC: Recent Labs  Lab 01/23/21 1923  WBC 4.5  NEUTROABS 2.3  HGB 10.7*  HCT 33.1*  MCV 81.1  PLT 296   Cardiac Enzymes: No results for input(s): CKTOTAL, CKMB, CKMBINDEX, TROPONINI in the last 168 hours.  BNP (last 3 results) Recent Labs    11/03/20 0620 11/04/20 0320 11/05/20 0434  BNP 67.3 72.8 105.0*    ProBNP (last 3 results) No results for input(s): PROBNP in the last 8760 hours.  CBG: No results for input(s): GLUCAP in the last 168 hours.  Radiological Exams on Admission: DG Chest Port 1 View  Result Date: 01/23/2021 CLINICAL DATA:  Chest pain and COVID-19 EXAM: PORTABLE CHEST 1 VIEW COMPARISON:  10/16/2020 FINDINGS: Bilateral apical predominant interstitial opacities compatible with known sarcoidosis, unchanged. No superimposed acute consolidation. No pleural effusion or pneumothorax. Normal cardiomediastinal contours. IMPRESSION: Chronic sarcoidosis without acute airspace disease. Electronically Signed   By: Ulyses Jarred M.D.   On: 01/23/2021 20:06    EKG: I independently viewed the EKG done and my findings are as followed: Sinus bradycardia at a rate of 55 bpm  Assessment/Plan Present on Admission: .  Hyponatremia  Active Problems:   Hyponatremia  Hyponatremia Na 117, this may be due to diuretic effect (HCTZ) This will be held at this time Gentle hydration will be provided Continue to monitor sodium with serial BMPs Urine osmolality, serum osmolality and urine sodium will be checked  Hypokalemia K+ 3.3, this will be replenished  Epigastric pain and nausea in the setting of history of diverticulitis  Patient complained of epigastric pain, nausea Patient has a history of diverticulitis and was scheduled for outpatient colon resection at the end of March prior to admission NSTEMI requiring stent placement on 10/19/2020 Surgery was postponed pending patient will be of antiplatelets due to stent placed  COVID-19 virus infection Patient has no shortness of breath and he does not want remdesivir (states wife died because she was treated with remdesivir) Continue vitamin-C 500 mg p.o. Daily Continue zinc 220 mg p.o. Daily Continue Mucinex, Robitussin and Tussionex Continue Tylenol p.r.n. for fever Continue airborne and contact isolation precaution Continue monitoring daily inflammatory markers Physician PPE:  Surgical mask with face shield, N-95, nonsterile gloves, disposable gown, head and shoe covers Patient PPE:  Face mask   Essential hypertension Continue losartan, metoprolol, Imdur  NSTEMI  s/p stent placement DES stent placed to LAD on 10/19/2020 Continue Plavix Patient takes Repatha every 2 weeks  Pulmonary embolism Continue Eliquis  GERD Continue Protonix  Sarcoidosis  Stable, Continue Singulair and Breo Ellipta  Hyperlipidemia Patient takes Repatha shots twice a month  BPH Continue finasteride   Restless leg syndrome Continue home Sinemet  Obstructive sleep apnea on CPAP Continue CPAP  DVT prophylaxis: Eliquis  Code Status: Full code  Family Communication: None at bedside  Disposition Plan:  Patient is from:                        home Anticipated  DC to:  SNF or family members home Anticipated DC date:               2-3 days Anticipated DC barriers:         patient is unstable to be discharged at this time due to hyponatremia requiring electrolyte replenishment  Consults called: None  Admission status: Observation    Bernadette Hoit MD [facility]  01/23/2021, 9:53 PM

## 2021-01-24 NOTE — Progress Notes (Addendum)
PROGRESS NOTE  Richard Webb QIO:[id number] DOB: 1945-11-17 DOA: 01/23/2021 PCP: [name], MD  Brief History:  75 year old male with a history of coronary artery disease with NSTEMI, pulmonary embolus, hypertension, sarcoidosis, OSA, hyperlipidemia presenting with epigastric discomfort that began in the afternoon of 01/23/2021.  The patient states that he actually began having subjective fevers, chills, with cough and chest congestion that began on 01/19/2021.  He was coughing up some yellow sputum.  Associated symptoms included sore throat and nausea.  He states that he has had poor p.o. intake and no appetite for the past week.  He has had some intermittent loose stools without any hematochezia or melena.  He states that he had 3 episodes of dry heaving on 01/23/2021.  He also complained of 3 episodes of loose stools on 01/23/2021.  He denies any hemoptysis, headache, hematemesis.  He states that his epigastric pain is completely different than when he had his MI back in March 2022.  Although he did try to take 1 nitroglycerin without any relief of his epigastric pain. The patient went to see his PCP on 01/19/2021.  He was given a steroid injection in the office and prescribed azithromycin. Notably, the patient was admitted to the hospital from 10/16/2020 to 10/21/2020 when he was treated for NSTEMI.  He underwent heart catheterization on 10/19/2020 with placement of DES to the proximal LAD.  He was noted to have three-vessel CAD with patent circumflex and RCA stents.  He was discharged home with Plavix.  He was also on apixaban for newly diagnosed LLL PE at that time.  He was readmitted to the hospital from 11/01/2020 to 11/05/2020 for diverticular abscess.  He was treated nonoperatively and discharged home with Augmentin. In the emergency department, the patient was afebrile hemodynamically stable with oxygen saturation 97% on room air.  Sodium 117, potassium 3.3, serum creatinine 0.68.  LFTs were  unremarkable.  WBC 4.5, hemoglobin 10.7, platelets 296,000.  Chest x-ray showed chronic bilateral opacities.  Assessment/Plan: Hyponatremia -Secondary to HCTZ, poor solute intake, and volume depletion -Discontinue HCTZ -Urine osmolarity -Serum osmolarity -Urine sodium -Urine creatinine -Judicious IV fluids  Epigastric pain -Normal LFTs -Lipase 43 -Troponin 7>>7  COVID-19 infection -Stable on room air -he is outside the window of time for benefit from po antiviral -supportive care  Coronary artery disease -No chest pain presently -Continue apixaban and Plavix -Continue statin -troponin 7>>7 -Continue Imdur  History of PE -Continue apixaban -Had PE March 2022  Essential hypertension -Continue metoprolol heart rate, losartan -Holding HCTZ as discussed above -Holding amlodipine temporarily  Sarcoidosis -Stable on room air -Continue Singulair -Continue bronchodilator  Hyperlipidemia -Continue statin  Ulcerative colitis -Continue home dose of Azulfidine  GERD -Continue PPI  Hypokalemia -replete -check mag      Status is: Observation  The patient will require care spanning > 2 midnights and should be moved to inpatient because: Unsafe d/c plan and IV treatments appropriate due to intensity of illness or inability to take PO  Dispo: The patient is from: Home              Anticipated d/c is to: Home              Patient currently is not medically stable to d/c.   Difficult to place patient No        Family Communication:  no Family at bedside  Consultants:  none  Code Status:  FULL   DVT Prophylaxis:  apixaban   Procedures: As Listed in Progress Note Above  Antibiotics: None      Subjective: He has a nonproductive cough.  He denies any hemoptysis.  He states that his epigastric discomfort is improved.  He denies any shortness of breath, abdominal pain,.  He had some loose stools on 01/23/2021 but has not had any hematochezia or  melena.  Objective: Vitals:   01/23/21 2130 01/23/21 2206 01/24/21 0110 01/24/21 0559  BP: (!) 144/69  135/67 (!) 120/53  Pulse: (!) 56  (!) 57 (!) 59  Resp: 18  18 18   Temp:  97.8 F (36.6 C) 97.7 F (36.5 C) 98 F (36.7 C)  TempSrc:  Oral Oral Oral  SpO2: 98%  97% 95%  Weight:   88.9 kg   Height:   6' (1.829 m)     Intake/Output Summary (Last 24 hours) at 01/24/2021 1540 Last data filed at 01/24/2021 0500 Gross per 24 hour  Intake 1093.23 ml  Output 250 ml  Net 843.23 ml   Weight change:  Exam:   General:  Pt is alert, follows commands appropriately, not in acute distress  HEENT: No icterus, No thrush, No neck mass, [place]/AT  Cardiovascular: RRR, S1/S2, no rubs, no gallops  Respiratory: Scattered bilateral rales.  No wheezing.  Good air movement  Abdomen: Soft/+BS, non tender, non distended, no guarding  Extremities: No edema, No lymphangitis, No petechiae, No rashes, no synovitis   Data Reviewed: I have personally reviewed following labs and imaging studies Basic Metabolic Panel: Recent Labs  Lab 01/23/21 1923  NA 117*  K 3.3*  CL 82*  CO2 26  GLUCOSE 104*  BUN 12  CREATININE 0.68  CALCIUM 8.4*   Liver Function Tests: Recent Labs  Lab 01/23/21 1923  AST 31  ALT 27  ALKPHOS 113  BILITOT 0.8  PROT 7.3  ALBUMIN 3.8   Recent Labs  Lab 01/23/21 1923  LIPASE 43   No results for input(s): AMMONIA in the last 168 hours. Coagulation Profile: No results for input(s): INR, PROTIME in the last 168 hours. CBC: Recent Labs  Lab 01/23/21 1923 01/24/21 0607  WBC 4.5 4.3  NEUTROABS 2.3 2.7  HGB 10.7* 10.1*  HCT 33.1* 31.6*  MCV 81.1 81.0  PLT 296 292   Cardiac Enzymes: No results for input(s): CKTOTAL, CKMB, CKMBINDEX, TROPONINI in the last 168 hours. BNP: Invalid input(s): POCBNP CBG: No results for input(s): GLUCAP in the last 168 hours. HbA1C: No results for input(s): HGBA1C in the last 72 hours. Urine analysis:    Component Value  Date/Time   COLORURINE YELLOW 11/03/2020 1300   APPEARANCEUR CLOUDY (A) 11/03/2020 1300   APPEARANCEUR Clear 09/22/2020 1718   LABSPEC 1.009 11/03/2020 1300   PHURINE 6.0 11/03/2020 1300   GLUCOSEU NEGATIVE 11/03/2020 1300   HGBUR MODERATE (A) 11/03/2020 1300   BILIRUBINUR NEGATIVE 11/03/2020 1300   BILIRUBINUR Negative 09/22/2020 1718   KETONESUR NEGATIVE 11/03/2020 1300   PROTEINUR 30 (A) 11/03/2020 1300   NITRITE NEGATIVE 11/03/2020 1300   LEUKOCYTESUR LARGE (A) 11/03/2020 1300   Sepsis Labs: @LABRCNTIP (procalcitonin:4,lacticidven:4) ) Recent Results (from the past 240 hour(s))  Resp Panel by RT-PCR (Flu A&B, Covid) Nasopharyngeal Swab     Status: Abnormal   Collection Time: 01/23/21  7:23 PM   Specimen: Nasopharyngeal Swab; Nasopharyngeal(NP) swabs in vial transport medium  Result Value Ref Range Status   SARS Coronavirus 2 by RT PCR POSITIVE (A) NEGATIVE Final    Comment: CRITICAL RESULT CALLED TO, READ BACK  BY AND VERIFIED WITH: BELTON,C 2116 01/23/2021 COLEMAN,R (NOTE) SARS-CoV-2 target nucleic acids are DETECTED.  The SARS-CoV-2 RNA is generally detectable in upper respiratory specimens during the acute phase of infection. Positive results are indicative of the presence of the identified virus, but do not rule out bacterial infection or co-infection with other pathogens not detected by the test. Clinical correlation with patient history and other diagnostic information is necessary to determine patient infection status. The expected result is Negative.  Fact Sheet for Patients: EntrepreneurPulse.com.au  Fact Sheet for Healthcare Providers: IncredibleEmployment.be  This test is not yet approved or cleared by the [place] FDA and  has been authorized for detection and/or diagnosis of SARS-CoV-2 by FDA under an Emergency Use Authorization (EUA).  This EUA will remain in effect (meaning this t est can be used) for the duration  of  the COVID-19 declaration under Section 564(b)(1) of the Act, 21 U.S.C. section 360bbb-3(b)(1), unless the authorization is terminated or revoked sooner.     Influenza A by PCR NEGATIVE NEGATIVE Final   Influenza B by PCR NEGATIVE NEGATIVE Final    Comment: (NOTE) The Xpert Xpress SARS-CoV-2/FLU/RSV plus assay is intended as an aid in the diagnosis of influenza from Nasopharyngeal swab specimens and should not be used as a sole basis for treatment. Nasal washings and aspirates are unacceptable for Xpert Xpress SARS-CoV-2/FLU/RSV testing.  Fact Sheet for Patients: EntrepreneurPulse.com.au  Fact Sheet for Healthcare Providers: IncredibleEmployment.be  This test is not yet approved or cleared by the [place] FDA and has been authorized for detection and/or diagnosis of SARS-CoV-2 by FDA under an Emergency Use Authorization (EUA). This EUA will remain in effect (meaning this test can be used) for the duration of the COVID-19 declaration under Section 564(b)(1) of the Act, 21 U.S.C. section 360bbb-3(b)(1), unless the authorization is terminated or revoked.  Performed at [facility], [address], [place], [place] [address]      Scheduled Meds: . apixaban  5 mg Oral BID  . vitamin C  500 mg Oral Daily  . carbidopa-levodopa  1 tablet Oral QHS  . clopidogrel  75 mg Oral Daily  . famotidine  20 mg Oral QPM  . finasteride  5 mg Oral Daily  . fluticasone furoate-vilanterol  1 puff Inhalation Daily  . isosorbide mononitrate  30 mg Oral Daily  . losartan  50 mg Oral Daily  . metoprolol tartrate  25 mg Oral BID  . montelukast  10 mg Oral QHS  . pantoprazole  40 mg Oral Daily  . zinc sulfate  220 mg Oral Daily   Continuous Infusions:  Procedures/Studies: DG Chest Port 1 View  Result Date: 01/23/2021 CLINICAL DATA:  Chest pain and COVID-19 EXAM: PORTABLE CHEST 1 VIEW COMPARISON:  10/16/2020 FINDINGS: Bilateral apical predominant  interstitial opacities compatible with known sarcoidosis, unchanged. No superimposed acute consolidation. No pleural effusion or pneumothorax. Normal cardiomediastinal contours. IMPRESSION: Chronic sarcoidosis without acute airspace disease. Electronically Signed   By: Ulyses Jarred M.D.   On: 01/23/2021 20:06    Orson Eva, DO  [facility]  If 7PM-7AM, please contact night-coverage [email or web address] Password [email or web address] 01/24/2021, 7:22 AM   LOS: 0 days

## 2021-01-25 DIAGNOSIS — I10 Essential (primary) hypertension: Secondary | ICD-10-CM | POA: Diagnosis not present

## 2021-01-25 DIAGNOSIS — U071 COVID-19: Secondary | ICD-10-CM | POA: Diagnosis not present

## 2021-01-25 DIAGNOSIS — D869 Sarcoidosis, unspecified: Secondary | ICD-10-CM | POA: Diagnosis not present

## 2021-01-25 DIAGNOSIS — E871 Hypo-osmolality and hyponatremia: Secondary | ICD-10-CM | POA: Diagnosis not present

## 2021-01-25 LAB — CBC WITH DIFFERENTIAL/PLATELET
Abs Immature Granulocytes: 0.02 10*3/uL (ref 0.00–0.07)
Basophils Absolute: 0 10*3/uL (ref 0.0–0.1)
Basophils Relative: 0 %
Eosinophils Absolute: 0.1 10*3/uL (ref 0.0–0.5)
Eosinophils Relative: 3 %
HCT: 31.8 % — ABNORMAL LOW (ref 39.0–52.0)
Hemoglobin: 10.1 g/dL — ABNORMAL LOW (ref 13.0–17.0)
Immature Granulocytes: 1 %
Lymphocytes Relative: 25 %
Lymphs Abs: 1.1 10*3/uL (ref 0.7–4.0)
MCH: 26.4 pg (ref 26.0–34.0)
MCHC: 31.8 g/dL (ref 30.0–36.0)
MCV: 83.2 fL (ref 80.0–100.0)
Monocytes Absolute: 0.5 10*3/uL (ref 0.1–1.0)
Monocytes Relative: 12 %
Neutro Abs: 2.5 10*3/uL (ref 1.7–7.7)
Neutrophils Relative %: 59 %
Platelets: 287 10*3/uL (ref 150–400)
RBC: 3.82 MIL/uL — ABNORMAL LOW (ref 4.22–5.81)
RDW: 15.5 % (ref 11.5–15.5)
WBC: 4.2 10*3/uL (ref 4.0–10.5)
nRBC: 0 % (ref 0.0–0.2)

## 2021-01-25 LAB — MAGNESIUM: Magnesium: 2.2 mg/dL (ref 1.7–2.4)

## 2021-01-25 LAB — COMPREHENSIVE METABOLIC PANEL
ALT: 12 U/L (ref 0–44)
AST: 20 U/L (ref 15–41)
Albumin: 3.3 g/dL — ABNORMAL LOW (ref 3.5–5.0)
Alkaline Phosphatase: 89 U/L (ref 38–126)
Anion gap: 5 (ref 5–15)
BUN: 7 mg/dL — ABNORMAL LOW (ref 8–23)
CO2: 23 mmol/L (ref 22–32)
Calcium: 8.3 mg/dL — ABNORMAL LOW (ref 8.9–10.3)
Chloride: 102 mmol/L (ref 98–111)
Creatinine, Ser: 0.7 mg/dL (ref 0.61–1.24)
GFR, Estimated: >60 mL/min (ref >60)
Glucose, Bld: 90 mg/dL (ref 70–99)
Potassium: 4 mmol/L (ref 3.5–5.1)
Sodium: 130 mmol/L — ABNORMAL LOW (ref 135–145)
Total Bilirubin: 0.4 mg/dL (ref 0.3–1.2)
Total Protein: 6.2 g/dL — ABNORMAL LOW (ref 6.5–8.1)

## 2021-01-25 LAB — D-DIMER, QUANTITATIVE: D-Dimer, Quant: 0.42 ug/mL-FEU (ref 0.00–0.50)

## 2021-01-25 LAB — FERRITIN: Ferritin: 299 ng/mL (ref 24–336)

## 2021-01-25 LAB — C-REACTIVE PROTEIN: CRP: 0.6 mg/dL (ref <1.0)

## 2021-01-25 LAB — PHOSPHORUS: Phosphorus: 2.3 mg/dL — ABNORMAL LOW (ref 2.5–4.6)

## 2021-01-25 LAB — OSMOLALITY: Osmolality: 248 mOsm/kg — CL (ref 275–295)

## 2021-01-25 NOTE — Progress Notes (Signed)
Nsg Discharge Note  Admit Date:  01/23/2021 Discharge date: 01/25/2021   [name] to be D/C'd Home per MD order.  AVS completed.  Copy for chart, and copy for patient signed, and dated. Patient/caregiver able to verbalize understanding.  Discharge Medication: Allergies as of 01/25/2021      Reactions   Bee Venom Anaphylaxis   Statins Other (See Comments)   Myalgias - Simvastatin and Rosuvastatin      Medication List    STOP taking these medications   acidophilus Caps capsule   amLODipine 10 MG tablet Commonly known as: NORVASC   hydrochlorothiazide 25 MG tablet Commonly known as: HYDRODIURIL   penicillin v potassium 500 MG tablet Commonly known as: VEETID   potassium chloride 10 MEQ tablet Commonly known as: KLOR-CON     TAKE these medications   amoxicillin 500 MG capsule Commonly known as: AMOXIL Take 1,500 mg by mouth once as needed. Take before dental appointment   apixaban 5 MG Tabs tablet Commonly known as: ELIQUIS Take 2 tablets twice daily until 3/7. On 3/8 start taking 1 tablet twice daily .   Breo Ellipta 200-25 MCG/INH Aepb Generic drug: fluticasone furoate-vilanterol Inhale 1 Inhaler into the lungs daily.   carbidopa-levodopa 10-100 MG tablet Commonly known as: SINEMET IR Take 1 tablet by mouth at bedtime.   clopidogrel 75 MG tablet Commonly known as: PLAVIX Take 1 tablet (75 mg total) by mouth daily.   cyanocobalamin 1000 MCG/ML injection Commonly known as: (VITAMIN B-12) Inject 1,000 mcg into the muscle every 30 (thirty) days.   Dupilumab 300 MG/2ML Sopn Inject 300 mg into the skin every 14 (fourteen) days.   famotidine 20 MG tablet Commonly known as: PEPCID Take 1 tablet (20 mg total) by mouth every evening. What changed: Another medication with the same name was removed. Continue taking this medication, and follow the directions you see here.   finasteride 5 MG tablet Commonly known as: PROSCAR Take 1 tablet (5 mg total) by mouth  daily.   folic acid 1 MG tablet Commonly known as: FOLVITE Take 1 mg by mouth See admin instructions. Mon - Fri   HYDROcodone-acetaminophen 5-325 MG tablet Commonly known as: NORCO/VICODIN Take 1 tablet by mouth every 6 (six) hours as needed for severe pain.   isosorbide mononitrate 30 MG 24 hr tablet Commonly known as: IMDUR TAKE 1 TABLET BY MOUTH  DAILY   losartan 50 MG tablet Commonly known as: COZAAR Take 50 mg by mouth daily.   metoprolol tartrate 25 MG tablet Commonly known as: LOPRESSOR TAKE 1 TABLET (25 MG TOTAL) BY MOUTH TWO TIMES DAILY. What changed: Another medication with the same name was removed. Continue taking this medication, and follow the directions you see here.   montelukast 10 MG tablet Commonly known as: SINGULAIR Take 10 mg by mouth at bedtime.   nitroGLYCERIN 0.4 MG SL tablet Commonly known as: NITROSTAT Place 1 tablet (0.4 mg total) under the tongue every 5 (five) minutes x 3 doses as needed for chest pain (if no relief after 3rd dose, proceed to the ED for an evaluation or call 911).   omeprazole 20 MG capsule Commonly known as: PRILOSEC Take 20 mg by mouth daily.   oxymetazoline 0.05 % nasal spray Commonly known as: AFRIN Place 1 spray into both nostrils 2 (two) times daily as needed for congestion.   Repatha SureClick 563 MG/ML Soaj Generic drug: Evolocumab Inject 1 Dose into the skin every 14 (fourteen) days. <PLEASE MAKE APPOINTMENT FOR REFILLS>  sulfaSALAzine 500 MG tablet Commonly known as: AZULFIDINE Take 1,000 mg by mouth 2 (two) times daily.   temazepam 30 MG capsule Commonly known as: RESTORIL Take 30 mg by mouth at bedtime as needed for sleep.   Vitamin D (Ergocalciferol) 1.25 MG (50000 UNIT) Caps capsule Commonly known as: DRISDOL Take 50,000 Units by mouth every 7 (seven) days. Thursdays       Discharge Assessment: Vitals:   01/25/21 0719 01/25/21 0934  BP:  120/65  Pulse:  66  Resp:    Temp:    SpO2: 95%     Skin clean, dry and intact without evidence of skin break down, no evidence of skin tears noted. IV catheter discontinued intact. Site without signs and symptoms of complications - no redness or edema noted at insertion site, patient denies c/o pain - only slight tenderness at site.  Dressing with slight pressure applied.  D/c Instructions-Education: Discharge instructions given to patient/family with verbalized understanding. D/c education completed with patient/family including follow up instructions, medication list, d/c activities limitations if indicated, with other d/c instructions as indicated by MD - patient able to verbalize understanding, all questions fully answered. Patient instructed to return to ED, call 911, or call MD for any changes in condition.  Patient escorted via [facility], and D/C home via private auto.  Clovis Fredrickson, LPN 09/30/4763 4:65 PM

## 2021-01-25 NOTE — Discharge Summary (Signed)
Physician Discharge Summary  Richard Webb HCW:[id number] DOB: July 11, 1946 DOA: 01/23/2021  PCP: [name], MD  Admit date: 01/23/2021 Discharge date: 01/25/2021  Admitted From: Home Disposition:  Home  Recommendations for Outpatient Follow-up:  1. Follow up with PCP in 1-2 weeks 2. Please obtain BMP/CBC in one week    Discharge Condition: Stable CODE STATUS: FULL Diet recommendation: Heart Healthy    Brief/Interim Summary: 75 year old male with a history of coronary artery disease with NSTEMI, pulmonary embolus, hypertension, sarcoidosis, OSA, hyperlipidemia presenting with epigastric discomfort that began in the afternoon of 01/23/2021.  The patient states that he actually began having subjective fevers, chills, with cough and chest congestion that began on 01/19/2021.  He was coughing up some yellow sputum.  Associated symptoms included sore throat and nausea.  He states that he has had poor p.o. intake and no appetite for the past week.  He has had some intermittent loose stools without any hematochezia or melena.  He states that he had 3 episodes of dry heaving on 01/23/2021.  He also complained of 3 episodes of loose stools on 01/23/2021.  He denies any hemoptysis, headache, hematemesis.  He states that his epigastric pain is completely different than when he had his MI back in March 2022.  Although he did try to take 1 nitroglycerin without any relief of his epigastric pain. The patient went to see his PCP on 01/19/2021.  He was given a steroid injection in the office and prescribed azithromycin. Notably, the patient was admitted to the hospital from 10/16/2020 to 10/21/2020 when he was treated for NSTEMI.  He underwent heart catheterization on 10/19/2020 with placement of DES to the proximal LAD.  He was noted to have three-vessel CAD with patent circumflex and RCA stents.  He was discharged home with Plavix.  He was also on apixaban for newly diagnosed LLL PE at that time.  He was readmitted to  the hospital from 11/01/2020 to 11/05/2020 for diverticular abscess.  He was treated nonoperatively and discharged home with Augmentin. In the emergency department, the patient was afebrile hemodynamically stable with oxygen saturation 97% on room air.  Sodium 117, potassium 3.3, serum creatinine 0.68.  LFTs were unremarkable.  WBC 4.5, hemoglobin 10.7, platelets 296,000.  Chest x-ray showed chronic bilateral opacities.  Discharge Diagnoses:  Hyponatremia -Secondary to HCTZ, poor solute intake, and volume depletion -Discontinue HCTZ -Urine osmolarity-219 -Serum osmolarity--248 -FeNa--0.20% -Judicious IV fluids>>Na improved to 130 on day of d/c  Epigastric pain -Normal LFTs -Lipase 43 -Troponin 7>>7 -resolved -tolerating diet  COVID-19 infection -Stable on room air -he is outside the window of time for benefit from po antiviral -supportive care  Coronary artery disease -No chest pain presently -Continue apixaban and Plavix -Continue statin -troponin 7>>7 -Continue Imdur  History of PE -Continue apixaban -Had PE March 2022  Essential hypertension -Continue metoprolol tartrate, losartan -Holding HCTZ as discussed above--will not restart -Holding amlodipine temporarily--will not restart -f/u PCP for BP check  Sarcoidosis -Stable on room air -Continue Singulair -Continue bronchodilator  Hyperlipidemia -Continue statin  Ulcerative colitis -Continue home dose of Azulfidine  GERD -Continue PPI  Hypokalemia -replete -check mag 2.2  Discharge Instructions   Allergies as of 01/25/2021      Reactions   Bee Venom Anaphylaxis   Statins Other (See Comments)   Myalgias - Simvastatin and Rosuvastatin      Medication List    STOP taking these medications   acidophilus Caps capsule   amLODipine 10 MG tablet Commonly known as: NORVASC  hydrochlorothiazide 25 MG tablet Commonly known as: HYDRODIURIL   penicillin v potassium 500 MG tablet Commonly  known as: VEETID   potassium chloride 10 MEQ tablet Commonly known as: KLOR-CON     TAKE these medications   amoxicillin 500 MG capsule Commonly known as: AMOXIL Take 1,500 mg by mouth once as needed. Take before dental appointment   apixaban 5 MG Tabs tablet Commonly known as: ELIQUIS Take 2 tablets twice daily until 3/7. On 3/8 start taking 1 tablet twice daily .   Breo Ellipta 200-25 MCG/INH Aepb Generic drug: fluticasone furoate-vilanterol Inhale 1 Inhaler into the lungs daily.   carbidopa-levodopa 10-100 MG tablet Commonly known as: SINEMET IR Take 1 tablet by mouth at bedtime.   clopidogrel 75 MG tablet Commonly known as: PLAVIX Take 1 tablet (75 mg total) by mouth daily.   cyanocobalamin 1000 MCG/ML injection Commonly known as: (VITAMIN B-12) Inject 1,000 mcg into the muscle every 30 (thirty) days.   Dupilumab 300 MG/2ML Sopn Inject 300 mg into the skin every 14 (fourteen) days.   famotidine 20 MG tablet Commonly known as: PEPCID Take 1 tablet (20 mg total) by mouth every evening. What changed: Another medication with the same name was removed. Continue taking this medication, and follow the directions you see here.   finasteride 5 MG tablet Commonly known as: PROSCAR Take 1 tablet (5 mg total) by mouth daily.   folic acid 1 MG tablet Commonly known as: FOLVITE Take 1 mg by mouth See admin instructions. Mon - Fri   HYDROcodone-acetaminophen 5-325 MG tablet Commonly known as: NORCO/VICODIN Take 1 tablet by mouth every 6 (six) hours as needed for severe pain.   isosorbide mononitrate 30 MG 24 hr tablet Commonly known as: IMDUR TAKE 1 TABLET BY MOUTH  DAILY   losartan 50 MG tablet Commonly known as: COZAAR Take 50 mg by mouth daily.   metoprolol tartrate 25 MG tablet Commonly known as: LOPRESSOR TAKE 1 TABLET (25 MG TOTAL) BY MOUTH TWO TIMES DAILY. What changed: Another medication with the same name was removed. Continue taking this medication, and  follow the directions you see here.   montelukast 10 MG tablet Commonly known as: SINGULAIR Take 10 mg by mouth at bedtime.   nitroGLYCERIN 0.4 MG SL tablet Commonly known as: NITROSTAT Place 1 tablet (0.4 mg total) under the tongue every 5 (five) minutes x 3 doses as needed for chest pain (if no relief after 3rd dose, proceed to the ED for an evaluation or call 911).   omeprazole 20 MG capsule Commonly known as: PRILOSEC Take 20 mg by mouth daily.   oxymetazoline 0.05 % nasal spray Commonly known as: AFRIN Place 1 spray into both nostrils 2 (two) times daily as needed for congestion.   Repatha SureClick 381 MG/ML Soaj Generic drug: Evolocumab Inject 1 Dose into the skin every 14 (fourteen) days. <PLEASE MAKE APPOINTMENT FOR REFILLS>   sulfaSALAzine 500 MG tablet Commonly known as: AZULFIDINE Take 1,000 mg by mouth 2 (two) times daily.   temazepam 30 MG capsule Commonly known as: RESTORIL Take 30 mg by mouth at bedtime as needed for sleep.   Vitamin D (Ergocalciferol) 1.25 MG (50000 UNIT) Caps capsule Commonly known as: DRISDOL Take 50,000 Units by mouth every 7 (seven) days. Thursdays       Allergies  Allergen Reactions  . Bee Venom Anaphylaxis  . Statins Other (See Comments)    Myalgias - Simvastatin and Rosuvastatin    Consultations:  none   Procedures/Studies:  DG Chest Port 1 View  Result Date: 01/23/2021 CLINICAL DATA:  Chest pain and COVID-19 EXAM: PORTABLE CHEST 1 VIEW COMPARISON:  10/16/2020 FINDINGS: Bilateral apical predominant interstitial opacities compatible with known sarcoidosis, unchanged. No superimposed acute consolidation. No pleural effusion or pneumothorax. Normal cardiomediastinal contours. IMPRESSION: Chronic sarcoidosis without acute airspace disease. Electronically Signed   By: Ulyses Jarred M.D.   On: 01/23/2021 20:06         Discharge Exam: Vitals:   01/25/21 0719 01/25/21 0934  BP:  120/65  Pulse:  66  Resp:    Temp:     SpO2: 95%    Vitals:   01/24/21 2005 01/25/21 0345 01/25/21 0719 01/25/21 0934  BP: (!) 104/50 (!) 92/58  120/65  Pulse: (!) 56 60  66  Resp: 18 18    Temp: 98.3 F (36.8 C) 98.2 F (36.8 C)    TempSrc: Oral     SpO2: 95% 94% 95%   Weight:      Height:        General: Pt is alert, awake, not in acute distress Cardiovascular: RRR, S1/S2 +, no rubs, no gallops Respiratory: CTA bilaterally, no wheezing, no rhonchi Abdominal: Soft, NT, ND, bowel sounds + Extremities: no edema, no cyanosis   The results of significant diagnostics from this hospitalization (including imaging, microbiology, ancillary and laboratory) are listed below for reference.    Significant Diagnostic Studies: DG Chest Port 1 View  Result Date: 01/23/2021 CLINICAL DATA:  Chest pain and COVID-19 EXAM: PORTABLE CHEST 1 VIEW COMPARISON:  10/16/2020 FINDINGS: Bilateral apical predominant interstitial opacities compatible with known sarcoidosis, unchanged. No superimposed acute consolidation. No pleural effusion or pneumothorax. Normal cardiomediastinal contours. IMPRESSION: Chronic sarcoidosis without acute airspace disease. Electronically Signed   By: Ulyses Jarred M.D.   On: 01/23/2021 20:06     Microbiology: Recent Results (from the past 240 hour(s))  Resp Panel by RT-PCR (Flu A&B, Covid) Nasopharyngeal Swab     Status: Abnormal   Collection Time: 01/23/21  7:23 PM   Specimen: Nasopharyngeal Swab; Nasopharyngeal(NP) swabs in vial transport medium  Result Value Ref Range Status   SARS Coronavirus 2 by RT PCR POSITIVE (A) NEGATIVE Final    Comment: CRITICAL RESULT CALLED TO, READ BACK BY AND VERIFIED WITH: BELTON,C 2116 01/23/2021 COLEMAN,R (NOTE) SARS-CoV-2 target nucleic acids are DETECTED.  The SARS-CoV-2 RNA is generally detectable in upper respiratory specimens during the acute phase of infection. Positive results are indicative of the presence of the identified virus, but do not rule out bacterial  infection or co-infection with other pathogens not detected by the test. Clinical correlation with patient history and other diagnostic information is necessary to determine patient infection status. The expected result is Negative.  Fact Sheet for Patients: EntrepreneurPulse.com.au  Fact Sheet for Healthcare Providers: IncredibleEmployment.be  This test is not yet approved or cleared by the [place] FDA and  has been authorized for detection and/or diagnosis of SARS-CoV-2 by FDA under an Emergency Use Authorization (EUA).  This EUA will remain in effect (meaning this t est can be used) for the duration of  the COVID-19 declaration under Section 564(b)(1) of the Act, 21 U.S.C. section 360bbb-3(b)(1), unless the authorization is terminated or revoked sooner.     Influenza A by PCR NEGATIVE NEGATIVE Final   Influenza B by PCR NEGATIVE NEGATIVE Final    Comment: (NOTE) The Xpert Xpress SARS-CoV-2/FLU/RSV plus assay is intended as an aid in the diagnosis of influenza from Nasopharyngeal swab specimens and should  not be used as a sole basis for treatment. Nasal washings and aspirates are unacceptable for Xpert Xpress SARS-CoV-2/FLU/RSV testing.  Fact Sheet for Patients: EntrepreneurPulse.com.au  Fact Sheet for Healthcare Providers: IncredibleEmployment.be  This test is not yet approved or cleared by the [place] FDA and has been authorized for detection and/or diagnosis of SARS-CoV-2 by FDA under an Emergency Use Authorization (EUA). This EUA will remain in effect (meaning this test can be used) for the duration of the COVID-19 declaration under Section 564(b)(1) of the Act, 21 U.S.C. section 360bbb-3(b)(1), unless the authorization is terminated or revoked.  Performed at [facility], [address], [place], [place] [address]   Culture, Urine     Status: None (Preliminary result)   Collection  Time: 01/24/21  9:56 AM   Specimen: Urine, Clean Catch  Result Value Ref Range Status   Specimen Description   Final    URINE, CLEAN CATCH Performed at [facility], [address], [place], [place] [address]    Special Requests   Final    NONE Performed at [facility], [address] [place], [place] [address]    Culture   Final    CULTURE REINCUBATED FOR BETTER GROWTH Performed at [facility], [place] [address] [place], [place] [address]    Report Status PENDING  Incomplete     Labs: Basic Metabolic Panel: Recent Labs  Lab 01/23/21 1923 01/24/21 0607 01/24/21 1032 01/25/21 0710  NA 117* 120* 120* 130*  K 3.3* 4.1 3.7 4.0  CL 82* 87* 89* 102  CO2 26 26 25 23   GLUCOSE 104* 103* 102* 90  BUN 12 10 9  7*  CREATININE 0.68 0.70 0.67 0.70  CALCIUM 8.4* 8.3* 8.2* 8.3*  MG  --  1.8  --  2.2  PHOS  --  2.3*  --  2.3*   Liver Function Tests: Recent Labs  Lab 01/23/21 1923 01/24/21 0607 01/25/21 0710  AST 31 27 20   ALT 27 8 12   ALKPHOS 113 98 89  BILITOT 0.8 0.6 0.4  PROT 7.3 6.5 6.2*  ALBUMIN 3.8 3.3* 3.3*   Recent Labs  Lab 01/23/21 1923  LIPASE 43   No results for input(s): AMMONIA in the last 168 hours. CBC: Recent Labs  Lab 01/23/21 1923 01/24/21 0607 01/25/21 0710  WBC 4.5 4.3 4.2  NEUTROABS 2.3 2.7 2.5  HGB 10.7* 10.1* 10.1*  HCT 33.1* 31.6* 31.8*  MCV 81.1 81.0 83.2  PLT 296 292 287   Cardiac Enzymes: No results for input(s): CKTOTAL, CKMB, CKMBINDEX, TROPONINI in the last 168 hours. BNP: Invalid input(s): POCBNP CBG: No results for input(s): GLUCAP in the last 168 hours.  Time coordinating discharge:  36 minutes  Signed:  Orson Eva, DO [facility] Pager: [phone] 01/25/2021, 12:27 PM

## 2021-01-27 LAB — URINE CULTURE: Culture: 80000 — AB

## 2021-02-01 DIAGNOSIS — N322 Vesical fistula, not elsewhere classified: Secondary | ICD-10-CM | POA: Diagnosis not present

## 2021-02-01 DIAGNOSIS — I10 Essential (primary) hypertension: Secondary | ICD-10-CM | POA: Diagnosis not present

## 2021-02-01 DIAGNOSIS — Z299 Encounter for prophylactic measures, unspecified: Secondary | ICD-10-CM | POA: Diagnosis not present

## 2021-02-01 DIAGNOSIS — Z8616 Personal history of COVID-19: Secondary | ICD-10-CM | POA: Diagnosis not present

## 2021-02-02 ENCOUNTER — Encounter: Payer: Self-pay | Admitting: Urology

## 2021-02-02 ENCOUNTER — Ambulatory Visit (INDEPENDENT_AMBULATORY_CARE_PROVIDER_SITE_OTHER): Payer: Medicare Other | Admitting: Urology

## 2021-02-02 ENCOUNTER — Other Ambulatory Visit: Payer: Self-pay

## 2021-02-02 VITALS — BP 117/63 | HR 59 | Ht 72.0 in | Wt 193.0 lb

## 2021-02-02 DIAGNOSIS — I25119 Atherosclerotic heart disease of native coronary artery with unspecified angina pectoris: Secondary | ICD-10-CM

## 2021-02-02 DIAGNOSIS — N3281 Overactive bladder: Secondary | ICD-10-CM

## 2021-02-02 DIAGNOSIS — N400 Enlarged prostate without lower urinary tract symptoms: Secondary | ICD-10-CM | POA: Diagnosis not present

## 2021-02-02 DIAGNOSIS — N321 Vesicointestinal fistula: Secondary | ICD-10-CM

## 2021-02-02 DIAGNOSIS — R310 Gross hematuria: Secondary | ICD-10-CM | POA: Diagnosis not present

## 2021-02-02 MED ORDER — PHENAZOPYRIDINE HCL 100 MG PO TABS: 100.0000 mg | ORAL_TABLET | Freq: Two times a day (BID) | ORAL | 2 refills | Status: DC | PRN

## 2021-02-02 NOTE — Patient Instructions (Signed)
Benign Prostatic Hyperplasia  Benign prostatic hyperplasia (BPH) is an enlarged prostate gland that is caused by the normal aging process and not by cancer. The prostate is a walnut-sized gland that is involved in the production of semen. It is located in front of the rectum and below the bladder. The bladder stores urine and the urethra is the tube that carries the urine out of the body. The prostate may get bigger asa man gets older. An enlarged prostate can press on the urethra. This can make it harder to pass urine. The build-up of urine in the bladder can cause infection. Back pressure and infection may progress to bladder damage and kidney (renal) failure. What are the causes? This condition is part of a normal aging process. However, not all men develop problems from this condition. If the prostate enlarges away from the urethra, urine flow will not be blocked. If it enlarges toward the urethra andcompresses it, there will be problems passing urine. What increases the risk? This condition is more likely to develop in men over the age of 36 years. What are the signs or symptoms? Symptoms of this condition include: Getting up often during the night to urinate. Needing to urinate frequently during the day. Difficulty starting urine flow. Decrease in size and strength of your urine stream. Leaking (dribbling) after urinating. Inability to pass urine. This needs immediate treatment. Inability to completely empty your bladder. Pain when you pass urine. This is more common if there is also an infection. Urinary tract infection (UTI). How is this diagnosed? This condition is diagnosed based on your medical history, a physical exam, and your symptoms. Tests will also be done, such as: A post-void bladder scan. This measures any amount of urine that may remain in your bladder after you finish urinating. A digital rectal exam. In a rectal exam, your health care provider checks your prostate by  putting a lubricated, gloved finger into your rectum to feel the back of your prostate gland. This exam detects the size of your gland and any abnormal lumps or growths. An exam of your urine (urinalysis). A prostate specific antigen (PSA) screening. This is a blood test used to screen for prostate cancer. An ultrasound. This test uses sound waves to electronically produce a picture of your prostate gland. Your health care provider may refer you to a specialist in kidney and prostate diseases (urologist). How is this treated? Once symptoms begin, your health care provider will monitor your condition (active surveillance or watchful waiting). Treatment for this condition will depend on the severity of your condition. Treatment may include: Observation and yearly exams. This may be the only treatment needed if your condition and symptoms are mild. Medicines to relieve your symptoms, including: Medicines to shrink the prostate. Medicines to relax the muscle of the prostate. Surgery in severe cases. Surgery may include: Prostatectomy. In this procedure, the prostate tissue is removed completely through an open incision or with a laparoscope or robotics. Transurethral resection of the prostate (TURP). In this procedure, a tool is inserted through the opening at the tip of the penis (urethra). It is used to cut away tissue of the inner core of the prostate. The pieces are removed through the same opening of the penis. This removes the blockage. Transurethral incision (TUIP). In this procedure, small cuts are made in the prostate. This lessens the prostate's pressure on the urethra. Transurethral microwave thermotherapy (TUMT). This procedure uses microwaves to create heat. The heat destroys and removes a small  amount of prostate tissue. Transurethral needle ablation (TUNA). This procedure uses radio frequencies to destroy and remove a small amount of prostate tissue. Interstitial laser coagulation (Gray).  This procedure uses a laser to destroy and remove a small amount of prostate tissue. Transurethral electrovaporization (TUVP). This procedure uses electrodes to destroy and remove a small amount of prostate tissue. Prostatic urethral lift. This procedure inserts an implant to push the lobes of the prostate away from the urethra. Follow these instructions at home: Take over-the-counter and prescription medicines only as told by your health care provider. Monitor your symptoms for any changes. Contact your health care provider with any changes. Avoid drinking large amounts of liquid before going to bed or out in public. Avoid or reduce how much caffeine or alcohol you drink. Give yourself time when you urinate. Keep all follow-up visits as told by your health care provider. This is important. Contact a health care provider if: You have unexplained back pain. Your symptoms do not get better with treatment. You develop side effects from the medicine you are taking. Your urine becomes very dark or has a bad smell. Your lower abdomen becomes distended and you have trouble passing your urine. Get help right away if: You have a fever or chills. You suddenly cannot urinate. You feel lightheaded, or very dizzy, or you faint. There are large amounts of blood or clots in the urine. Your urinary problems become hard to manage. You develop moderate to severe low back or flank pain. The flank is the side of your body between the ribs and the hip. These symptoms may represent a serious problem that is an emergency. Do not wait to see if the symptoms will go away. Get medical help right away. Call your local emergency services (911 in the [place] Do not drive yourself to the hospital. Summary Benign prostatic hyperplasia (BPH) is an enlarged prostate that is caused by the normal aging process and not by cancer. An enlarged prostate can press on the urethra. This can make it hard to pass urine. This  condition is part of a normal aging process and is more likely to develop in men over the age of 37 years. Get help right away if you suddenly cannot urinate. This information is not intended to replace advice given to you by your health care provider. Make sure you discuss any questions you have with your healthcare provider. Document Revised: 04/15/2020 Document Reviewed: 04/15/2020 Elsevier Patient Education  [place].

## 2021-02-02 NOTE — Progress Notes (Signed)
02/02/2021 4:05 PM   [name] 06-Oct-1945 [id number]  Referring provider: [name], MD [place],  [place] [address]  Pneumaturia    HPI: Mr Kluger is a 75yo here for followup for gross hematuria. He is on finasteride 76m daily. IPSS 14 QOL 5. 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 He saw ALeighton Ruffand was scheduled for surgery and prior to surgery he had an MI. He had 3 stents placed and is currently on plavix and eliquis. He continues to have intermittent gross hematuria.    PMH: Past Medical History:  Diagnosis Date   Arthritis    Asthma    Coronary atherosclerosis of native coronary artery    a. DES x 3 RCA and DES mCx 10/11 - [place] - with residual 50-70% stenosis in the distal Cx in AV groove, 70% distal LAD beyond apex, 70% small D1. b. 12/2017: similar results with multivessel CAD. Patent stents. Medical management recommended.    Diverticulitis    GERD (gastroesophageal reflux disease)    History of kidney stones    Hypertension    Mixed hyperlipidemia    [place] [place] ([facility]    Obstructive sleep apnea    Pulmonary hypertension (HCC)    PASP 50 mmHg   Restless leg syndrome    Sarcoidosis    Statin intolerance    Ulcerative colitis ([facility]     Surgical History: Past Surgical History:  Procedure Laterality Date   Ankle fracture repair Left 1985   CARDIAC CATHETERIZATION     stents x4   CHOLECYSTECTOMY  1970   CORONARY STENT INTERVENTION N/A 10/19/2020   Procedure: CORONARY STENT INTERVENTION;  Surgeon: KTroy Sine MD;  [facility];  Service: Cardiovascular;  Laterality: N/A;   CYSTOSCOPY WITH INSERTION OF UROLIFT N/A 08/27/2017   Procedure: CYSTOSCOPY WITH INSERTION OF UROLIFT;  Surgeon: MCleon Gustin MD;  Location: [facility];  Service: Urology;  Laterality: N/A;   JOINT REPLACEMENT     LAPAROSCOPIC APPENDECTOMY  2013   LEFT HEART CATH AND CORONARY ANGIOGRAPHY N/A  01/02/2018   Procedure: LEFT HEART CATH AND CORONARY ANGIOGRAPHY;  Surgeon: KTroy Sine MD;  [facility];  Service: Cardiovascular;  Laterality: N/A;   LEFT HEART CATH AND CORONARY ANGIOGRAPHY N/A 10/19/2020   Procedure: LEFT HEART CATH AND CORONARY ANGIOGRAPHY;  Surgeon: KTroy Sine MD;  [facility];  Service: Cardiovascular;  Laterality: N/A;   Right total knee replacement Bilateral 2005    Home Medications:  Allergies as of 02/02/2021       Reactions   Bee Venom Anaphylaxis   Statins Other (See Comments)   Myalgias - Simvastatin and Rosuvastatin        Medication List        Accurate as of February 02, 2021  4:05 PM. If you have any questions, ask your nurse or doctor.          amoxicillin 500 MG capsule Commonly known as: AMOXIL Take 1,500 mg by mouth once as needed. Take before dental appointment   apixaban 5 MG Tabs tablet Commonly known as: ELIQUIS Take 2 tablets twice daily until 3/7. On 3/8 start taking 1 tablet twice daily .   Breo Ellipta 200-25 MCG/INH Aepb Generic drug: fluticasone furoate-vilanterol Inhale 1 Inhaler into the lungs daily.   carbidopa-levodopa 10-100 MG tablet Commonly known as: SINEMET IR Take 1 tablet by mouth at bedtime.   clopidogrel 75  MG tablet Commonly known as: PLAVIX Take 1 tablet (75 mg total) by mouth daily.   cyanocobalamin 1000 MCG/ML injection Commonly known as: (VITAMIN B-12) Inject 1,000 mcg into the muscle every 30 (thirty) days.   Dupilumab 300 MG/2ML Sopn Inject 300 mg into the skin every 14 (fourteen) days.   famotidine 20 MG tablet Commonly known as: PEPCID Take 1 tablet (20 mg total) by mouth every evening.   finasteride 5 MG tablet Commonly known as: PROSCAR Take 1 tablet (5 mg total) by mouth daily.   folic acid 1 MG tablet Commonly known as: FOLVITE Take 1 mg by mouth See admin instructions. Mon - Fri   HYDROcodone-acetaminophen 5-325 MG tablet Commonly known  as: NORCO/VICODIN Take 1 tablet by mouth every 6 (six) hours as needed for severe pain.   isosorbide mononitrate 30 MG 24 hr tablet Commonly known as: IMDUR TAKE 1 TABLET BY MOUTH  DAILY   losartan 50 MG tablet Commonly known as: COZAAR Take 50 mg by mouth daily.   metoprolol tartrate 25 MG tablet Commonly known as: LOPRESSOR TAKE 1 TABLET (25 MG TOTAL) BY MOUTH TWO TIMES DAILY.   montelukast 10 MG tablet Commonly known as: SINGULAIR Take 10 mg by mouth at bedtime.   nitroGLYCERIN 0.4 MG SL tablet Commonly known as: NITROSTAT Place 1 tablet (0.4 mg total) under the tongue every 5 (five) minutes x 3 doses as needed for chest pain (if no relief after 3rd dose, proceed to the ED for an evaluation or call 911).   omeprazole 20 MG capsule Commonly known as: PRILOSEC Take 20 mg by mouth daily.   oxymetazoline 0.05 % nasal spray Commonly known as: AFRIN Place 1 spray into both nostrils 2 (two) times daily as needed for congestion.   Repatha SureClick 315 MG/ML [place] Generic drug: Evolocumab Inject 1 Dose into the skin every 14 (fourteen) days. <PLEASE MAKE APPOINTMENT FOR REFILLS>   sulfaSALAzine 500 MG tablet Commonly known as: AZULFIDINE Take 1,000 mg by mouth 2 (two) times daily.   temazepam 30 MG capsule Commonly known as: RESTORIL Take 30 mg by mouth at bedtime as needed for sleep.   Vitamin D (Ergocalciferol) 1.25 MG (50000 UNIT) Caps capsule Commonly known as: DRISDOL Take 50,000 Units by mouth every 7 (seven) days. Thursdays        Allergies:  Allergies  Allergen Reactions   Bee Venom Anaphylaxis   Statins Other (See Comments)    Myalgias - Simvastatin and Rosuvastatin    Family History: Family History  Problem Relation Age of Onset   Colon cancer Mother    CAD Father     Social History:  reports that he has never smoked. He has never used smokeless tobacco. He reports that he does not drink alcohol and does not use drugs.  ROS: All other review of  systems were reviewed and are negative except what is noted above in HPI  Physical Exam: BP 117/63   Pulse (!) 59   Ht 6' (1.829 m)   Wt 193 lb (87.5 kg)   BMI 26.18 kg/m   Constitutional:  Alert and oriented, No acute distress. HEENT: [place] ARB AT, moist mucus membranes.  Trachea midline, no masses. Cardiovascular: No clubbing, cyanosis, or edema. Respiratory: Normal respiratory effort, no increased work of breathing. GI: Abdomen is soft, nontender, nondistended, no abdominal masses GU: No CVA tenderness.  Lymph: No cervical or inguinal lymphadenopathy. Skin: No rashes, bruises or suspicious lesions. Neurologic: Grossly intact, no focal deficits, moving all 4 extremities.  Psychiatric: Normal mood and affect.  Laboratory Data: Lab Results  Component Value Date   WBC 4.2 01/25/2021   HGB 10.1 (L) 01/25/2021   HCT 31.8 (L) 01/25/2021   MCV 83.2 01/25/2021   PLT 287 01/25/2021    Lab Results  Component Value Date   CREATININE 0.70 01/25/2021    No results found for: PSA  No results found for: TESTOSTERONE  Lab Results  Component Value Date   HGBA1C 6.2 (H) 10/16/2020    Urinalysis    Component Value Date/Time   COLORURINE AMBER (A) 01/24/2021 0957   APPEARANCEUR CLOUDY (A) 01/24/2021 0957   APPEARANCEUR Clear 09/22/2020 1718   LABSPEC 1.004 (L) 01/24/2021 0957   PHURINE 7.0 01/24/2021 0957   GLUCOSEU NEGATIVE 01/24/2021 0957   HGBUR LARGE (A) 01/24/2021 0957   BILIRUBINUR NEGATIVE 01/24/2021 0957   BILIRUBINUR Negative 09/22/2020 1718   KETONESUR NEGATIVE 01/24/2021 0957   PROTEINUR NEGATIVE 01/24/2021 0957   NITRITE NEGATIVE 01/24/2021 0957   LEUKOCYTESUR LARGE (A) 01/24/2021 0957    Lab Results  Component Value Date   LABMICR Comment 09/22/2020   WBCUA None seen 09/10/2020   LABEPIT None seen 09/10/2020   BACTERIA MANY (A) 01/24/2021    Pertinent Imaging:  No results found for this or any previous visit.  No results found for this or any previous  visit.  No results found for this or any previous visit.  No results found for this or any previous visit.  No results found for this or any previous visit.  No results found for this or any previous visit.  Results for orders placed during the hospital encounter of 04/09/20  CT HEMATURIA WORKUP  Narrative CLINICAL DATA:  Gross hematuria with lower abdominal and groin pain  EXAM: CT ABDOMEN AND PELVIS WITHOUT AND WITH CONTRAST  TECHNIQUE: Multidetector CT imaging of the abdomen and pelvis was performed following the standard protocol before and following the bolus administration of intravenous contrast.  CONTRAST:  152m OMNIPAQUE IOHEXOL 300 MG/ML  SOLN  COMPARISON:  Abdomen and pelvis CT from 2017  FINDINGS: Lower chest: 9 x 7 mm part solid pulmonary nodule (image 12, series 4) septal thickening at the lung bases as well with some subpleural reticulation. Concern in the LEFT lung base on the prior exam measuring approximately 5-6 mm.  Mild septal thickening also present at the RIGHT lung base with parenchymal scarring as slightly worse than the previous exam.  On prone imaging there is persistent subpleural reticulation and there is some nodularity that is more evident (image 41, series 15) 5 mm juxta diaphragmatic pulmonary nodule. No consolidation or sign of pleural effusion. Multiple small nodules are seen at the RIGHT lung base as well, for instance on image 14 of series 15 there are at least 5/6 additional small nodules, a 4 mm nodule in the RIGHT lung base serving as an example of multiple small nodules.  Hepatobiliary: Mildly nodular hepatic contours with fissural widening. Portal vein is patent liver contours are unchanged since 2017. No focal, suspicious hepatic lesion. Post cholecystectomy without gross biliary duct distension.  Pancreas: Pancreas without focal lesion or ductal dilation. No signs of peripancreatic inflammation.  Spleen: Spleen normal  in size and contour with no focal lesion. Small adjacent splenule.  Perisplenic collaterals in the LEFT hemiabdomen. Splenic vein remains patent as does the SMV.  Adrenals/Urinary Tract: Adrenal glands are normal.  Bilateral renal cysts, largest on the RIGHT approximately 4.5 x 3.4 cm. No hydronephrosis. No ureteral calculus.  Small lower pole calculus on the LEFT. This measures approximately 3 mm.  Mild perivesical stranding. Bladder wall thickening with adjacent diverticular inflammation  Large inflamed diverticulum versus small pericolonic abscess from diverticulitis sits atop the urinary bladder. (Image 71 series 7) and image 52, series 11) there is colonic thickening as well in this area.  No sign of upper tract lesion.  Stomach/Bowel: Small hiatal hernia. No signs of small bowel obstruction or acute small bowel process. Post appendectomy.  Segmental colonic thickening more pronounced adjacent to pericolonic fluid and gas that abuts the urinary bladder and tracks along the dome of the bladder. This area measures approximately 2.3 x 1.9 cm. No gas in the urinary bladder at this time.  Extending superiorly from the sigmoid colon is a gas containing tract best seen on image 71 of series 12. There is a diverticulum in this area on the previous study but this appears more pronounced and is associated with fluid density that extends superiorly from the colon, adjacent to a wide-mouth diverticulum. There is no pericolonic inflammation in this location. The area extending from the gas containing tract abuts small bowel loops measuring approximately 2.1 x 1.4 cm.  Vascular/Lymphatic: Calcified atheromatous plaque in the abdominal aorta, no aneurysm. No adenopathy in the retroperitoneum. Small lymph nodes in the upper abdomen are unchanged.  No pelvic lymphadenopathy.  Reproductive: Post uro lift procedure.  Post RIGHT orchiectomy.  Other: No free air.  No  ascites.  Musculoskeletal: No acute musculoskeletal process. No destructive bone finding. Spinal degenerative changes.  IMPRESSION: 1. Pericolonic abscess likely related to diverticulitis between the sigmoid and the urinary bladder with thickening of the wall of the urinary bladder, likely secondary cystitis. No current signs of colovesical fistula though with this appearance fistula formation is a risk. 2. Eccentric thickening of the inferior wall of the colon is noted, potentially related to more pronounced inflammation in this location would however suggest follow-up colonoscopy to exclude underlying colonic lesion. 3. Small gas containing tract extending superiorly from the sigmoid slightly downstream from this area of abnormality was present previously but appears slightly larger than on the prior study. Significance uncertain. Potentially related to prior diverticulitis. 4. No free air or ascites. 5. No signs of upper tract lesion with renal cortical scarring, nephrolithiasis and renal cysts. 6. 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 Correlate with any clinical or laboratory evidence of liver disease. 7. Multiple small pulmonary nodules at the lung bases with septal thickening and subpleural reticulation. Correlate with any risk factors for aspiration or history of chronic infection. Given the 8 mm mean diameter pulmonary nodule which has enlarged since prior imaging consider dedicated chest CT and/or comparison with prior evaluations. Based on spiculated appearance scarring or bronchogenic neoplasm could have this appearance. 8. Aortic atherosclerosis.  Aortic Atherosclerosis (ICD10-I70.0).   Electronically Signed By: Zetta Bills M.D. On: 04/10/2020 16:57  No results found for this or any previous visit.   Assessment & Plan:    1. Gross hematuria -likely related  - Urinalysis, Routine w reflex microscopic  2. Benign  prostatic hyperplasia, unspecified whether lower urinary tract symptoms present -continue finasteride  3. Colovesical fistula -pyridium prn for dysuria -Patient to see cardiology and be cleared to stop Plavix and eliquis for colovesical fistula repair.   No follow-ups on file.  Nicolette Bang, MD  [facility]

## 2021-02-02 NOTE — Progress Notes (Signed)
Urological Symptom Review  Patient is experiencing the following symptoms: Burning/pain with urination Get up at night to urinate Blood in urine Penile pain (male only)    Review of Systems  Gastrointestinal (upper)  : Negative for upper GI symptoms  Gastrointestinal (lower) : Negative for lower GI symptoms  Constitutional : Weight loss  Skin: Negative for skin symptoms  Eyes: Negative for eye symptoms  Ear/Nose/Throat : Negative for Ear/Nose/Throat symptoms  Hematologic/Lymphatic: Negative for Hematologic/Lymphatic symptoms  Cardiovascular : Negative for cardiovascular symptoms  Respiratory : Negative for respiratory symptoms  Endocrine: Negative for endocrine symptoms  Musculoskeletal: Back pain Joint pain  Neurological: Negative for neurological symptoms  Psychologic: Negative for psychiatric symptoms

## 2021-02-03 LAB — URINALYSIS, ROUTINE W REFLEX MICROSCOPIC

## 2021-02-03 LAB — MICROSCOPIC EXAMINATION

## 2021-02-08 ENCOUNTER — Other Ambulatory Visit: Payer: Self-pay

## 2021-02-08 ENCOUNTER — Encounter: Payer: Self-pay | Admitting: Student

## 2021-02-08 ENCOUNTER — Ambulatory Visit (INDEPENDENT_AMBULATORY_CARE_PROVIDER_SITE_OTHER): Payer: Medicare Other | Admitting: Student

## 2021-02-08 VITALS — BP 136/64 | HR 64 | Ht 72.0 in | Wt 194.0 lb

## 2021-02-08 DIAGNOSIS — Z01810 Encounter for preprocedural cardiovascular examination: Secondary | ICD-10-CM

## 2021-02-08 DIAGNOSIS — I10 Essential (primary) hypertension: Secondary | ICD-10-CM | POA: Diagnosis not present

## 2021-02-08 DIAGNOSIS — I25119 Atherosclerotic heart disease of native coronary artery with unspecified angina pectoris: Secondary | ICD-10-CM

## 2021-02-08 DIAGNOSIS — I2510 Atherosclerotic heart disease of native coronary artery without angina pectoris: Secondary | ICD-10-CM

## 2021-02-08 DIAGNOSIS — I2699 Other pulmonary embolism without acute cor pulmonale: Secondary | ICD-10-CM | POA: Diagnosis not present

## 2021-02-08 DIAGNOSIS — E785 Hyperlipidemia, unspecified: Secondary | ICD-10-CM | POA: Diagnosis not present

## 2021-02-08 NOTE — Patient Instructions (Signed)
Medication Instructions:  Your physician recommends that you continue on your current medications as directed. Please refer to the Current Medication list given to you today.  *If you need a refill on your cardiac medications before your next appointment, please call your pharmacy*   Lab Work: None today  If you have labs (blood work) drawn today and your tests are completely normal, you will receive your results only by: [place] (if you have MyChart) OR A paper copy in the mail If you have any lab test that is abnormal or we need to change your treatment, we will call you to review the results.   Testing/Procedures: None today    Follow-Up: At [facility], you and your health needs are our priority.  As part of our continuing mission to provide you with exceptional heart care, we have created designated Provider Care Teams.  These Care Teams include your primary Cardiologist (physician) and Advanced Practice Providers (APPs -  Physician Assistants and Nurse Practitioners) who all work together to provide you with the care you need, when you need it.  We recommend signing up for the patient portal called "MyChart".  Sign up information is provided on this After Visit Summary.  MyChart is used to connect with patients for Virtual Visits (Telemedicine).  Patients are able to view lab/test results, encounter notes, upcoming appointments, etc.  Non-urgent messages can be sent to your provider as well.   To learn more about what you can do with MyChart, go to [email or web address].    Your next appointment:   3 month(s)  The format for your next appointment:   In Person  Provider:   Rozann Lesches, MD   Other Instructions None

## 2021-02-08 NOTE — Progress Notes (Addendum)
Cardiology Office Note    Date:  02/08/2021   ID:  Richard Webb, DOB 1946/01/13, MRN [id number]  PCP:  [name], MD  Cardiologist: Rozann Lesches, MD    Chief Complaint  Patient presents with   Follow-up    Pre-operative Cardiac Clearance     History of Present Illness:    Richard Webb is a 75 y.o. male with past medical history of CAD (s/p DESx3 to RCA an DES to LCx in 2011, s/p NSTEMI with DES to prox-LAD in 10/2020), HTN, HLD, recent PE (diagnosed in 09/2020), BPH and hematuria who presents to the office today for preoperative clearance.  He was admitted for an NSTEMI in 10/2020 as outlined above and had a recurrent admission later that month for sigmoid diverticulitis with an abscess.  Medical management was pursued given his recent stent placement and was recommended by Interventional Cardiology that he would require bridging with IV Aggrastat if having to stop Plavix at that time and would also need bridging with Lovenox given his recent PE. He did follow-up with Katina Dung, NP in 10/2020 and was continued on his current cardiac medications. Was not on ASA given the need for Eliquis and Plavix. He did see Dr. Domenic Polite for follow-up on 12/09/2020 and had completed a limited cardiac rehab course in [place]. He denied any recent anginal symptoms and was planning to exercise at a local gym 3 days a week. Was recommended that the earliest Plavix could be temporarily stopped was 6 months out from DES placement and would also anticipate at least a 71-monthcourse of Eliquis for his PE.  [facility] of fever, chills and chest congestion. Was found to be positive for COVID-19 but was outside of the window for benefit from antiviral treatment. He was hyponatremic on admission and HCTZ was discontinued along with Amlodipine being discontinued due to hypotension. He did follow-up with Urology in the interim and it was  recommended to proceed with colovesicular fistula repair once able to stop Plavix.  In talking with the patient today, he reports doing well from a cardiac perspective and has been walking for 2 to 3 days each week without any anginal symptoms.  No recent exertional chest pain, dyspnea on exertion, palpitations, orthopnea or PND. He does report intermittent lower extremity edema but says symptoms have overall been well controlled despite HCTZ being recently discontinued. 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   Past Medical History:  Diagnosis Date   Arthritis    Asthma    Coronary atherosclerosis of native coronary artery    a. DES x 3 RCA and DES mCx 10/11 - [place] - with residual 50-70% stenosis in the distal Cx in AV groove, 70% distal LAD beyond apex, 70% small D1. b. 12/2017: similar results with multivessel CAD. Patent stents. Medical management recommended.  c. s/p NSTEMI in 10/2020 with DES to prox-LAD   Diverticulitis    GERD (gastroesophageal reflux disease)    History of kidney stones    Hypertension    Mixed hyperlipidemia    Myocardial infarction (HCC)    Obstructive sleep apnea    Pulmonary hypertension (HCC)    PASP 50 mmHg   Restless leg syndrome    Sarcoidosis    Statin intolerance    Ulcerative  colitis [facility])     Past Surgical History:  Procedure Laterality Date   Ankle fracture repair Left 1985   CARDIAC CATHETERIZATION     stents x4   CHOLECYSTECTOMY  1970   CORONARY STENT INTERVENTION N/A 10/19/2020   Procedure: CORONARY STENT INTERVENTION;  Surgeon: Troy Sine, MD;  Location: [place] [facility];  Service: Cardiovascular;  Laterality: N/A;   CYSTOSCOPY WITH INSERTION OF UROLIFT N/A 08/27/2017   Procedure: CYSTOSCOPY WITH INSERTION OF UROLIFT;  Surgeon: Cleon Gustin, MD;  Location: [facility];  Service: Urology;  Laterality: N/A;   JOINT REPLACEMENT     LAPAROSCOPIC APPENDECTOMY  2013   LEFT HEART CATH AND CORONARY ANGIOGRAPHY N/A 01/02/2018   Procedure: LEFT HEART CATH AND CORONARY ANGIOGRAPHY;  Surgeon: Troy Sine, MD;  Location: [place] [facility];  Service: Cardiovascular;  Laterality: N/A;   LEFT HEART CATH AND CORONARY ANGIOGRAPHY N/A 10/19/2020   Procedure: LEFT HEART CATH AND CORONARY ANGIOGRAPHY;  Surgeon: Troy Sine, MD;  Location: [place] [facility];  Service: Cardiovascular;  Laterality: N/A;   Right total knee replacement Bilateral 2005    Current Medications: Outpatient Medications Prior to Visit  Medication Sig Dispense Refill   apixaban (ELIQUIS) 5 MG TABS tablet Take 2 tablets twice daily until 3/7. On 3/8 start taking 1 tablet twice daily . 60 tablet 3   BREO ELLIPTA 200-25 MCG/INH AEPB Inhale 1 Inhaler into the lungs daily.  3   carbidopa-levodopa (SINEMET IR) 10-100 MG per tablet Take 1 tablet by mouth at bedtime.     clopidogrel (PLAVIX) 75 MG tablet Take 1 tablet (75 mg total) by mouth daily. 30 tablet 5   cyanocobalamin (,VITAMIN B-12,) 1000 MCG/ML injection Inject 1,000 mcg into the muscle every 30 (thirty) days.     Dupilumab 300 MG/2ML SOPN Inject 300 mg into the skin every 14 (fourteen) days.     Evolocumab (REPATHA SURECLICK) 585 MG/ML SOAJ Inject 1 Dose into the skin every 14 (fourteen) days. <PLEASE MAKE APPOINTMENT FOR REFILLS> 6 mL 0   famotidine (PEPCID) 20 MG tablet Take 1 tablet (20 mg total) by mouth every evening. 30 tablet 3   finasteride (PROSCAR) 5 MG tablet Take 1 tablet (5 mg total) by mouth daily. 90 tablet 3   folic acid (FOLVITE) 1 MG tablet Take 1 mg by mouth See admin instructions. Mon - Fri     HYDROcodone-acetaminophen (NORCO/VICODIN) 5-325 MG tablet Take 1 tablet by mouth every 6 (six) hours as needed for severe pain.     isosorbide mononitrate (IMDUR) 30 MG 24 hr tablet TAKE 1 TABLET BY MOUTH  DAILY 90 tablet 3    losartan (COZAAR) 50 MG tablet Take 50 mg by mouth daily.     metoprolol tartrate (LOPRESSOR) 25 MG tablet TAKE 1 TABLET (25 MG TOTAL) BY MOUTH TWO TIMES DAILY. 60 tablet 3   montelukast (SINGULAIR) 10 MG tablet Take 10 mg by mouth at bedtime.     nitroGLYCERIN (NITROSTAT) 0.4 MG SL tablet Place 1 tablet (0.4 mg total) under the tongue every 5 (five) minutes x 3 doses as needed for chest pain (if no relief after 3rd dose, proceed to the ED for an evaluation or call 911). 90 tablet 3   omeprazole (PRILOSEC) 20 MG capsule Take 20 mg by mouth daily.     oxymetazoline (AFRIN) 0.05 % nasal spray Place 1 spray into both nostrils 2 (two) times daily as needed for congestion.     phenazopyridine (  PYRIDIUM) 100 MG tablet Take 1 tablet (100 mg total) by mouth 2 (two) times daily as needed for pain. 30 tablet 2   sulfaSALAzine (AZULFIDINE) 500 MG tablet Take 1,000 mg by mouth 2 (two) times daily.     temazepam (RESTORIL) 30 MG capsule Take 30 mg by mouth at bedtime as needed for sleep.      Vitamin D, Ergocalciferol, (DRISDOL) 50000 UNITS CAPS capsule Take 50,000 Units by mouth every 7 (seven) days. Thursdays     amoxicillin (AMOXIL) 500 MG capsule Take 1,500 mg by mouth once as needed. Take before dental appointment (Patient not taking: Reported on 02/02/2021)     No facility-administered medications prior to visit.     Allergies:   Bee venom and Statins   Social History   Socioeconomic History   Marital status: Married    Spouse name: Not on file   Number of children: Not on file   Years of education: Not on file   Highest education level: Not on file  Occupational History   Occupation: Theme park manager  Tobacco Use   Smoking status: Never   Smokeless tobacco: Never  Vaping Use   Vaping Use: Never used  Substance and Sexual Activity   Alcohol use: No   Drug use: No   Sexual activity: Yes    Birth control/protection: None  Other Topics Concern   Not on file  Social History Narrative   Not on file    Social Determinants of Health   Financial Resource Strain: Not on file  Food Insecurity: Not on file  Transportation Needs: Not on file  Physical Activity: Not on file  Stress: Not on file  Social Connections: Not on file     Family History:  The patient's family history includes CAD in his father; Colon cancer in his mother.   Review of Systems:    Please see the history of present illness.     All other systems reviewed and are otherwise negative except as noted above.   Physical Exam:    VS:  BP 136/64   Pulse 64   Ht 6' (1.829 m)   Wt 194 lb (88 kg)   SpO2 96%   BMI 26.31 kg/m    General: Well developed, well nourished,male appearing in no acute distress. Head: Normocephalic, atraumatic. Neck: No carotid bruits. JVD not elevated.  Lungs: Respirations regular and unlabored, without wheezes or rales.  Heart: Regular rate and rhythm. No S3 or S4.  No murmur, no rubs, or gallops appreciated. Abdomen: Appears non-distended. No obvious abdominal masses. Msk:  Strength and tone appear normal for age. No obvious joint deformities or effusions. Extremities: No clubbing or cyanosis. No lower extremity edema.  Distal pedal pulses are 2+ bilaterally. Neuro: Alert and oriented X 3. Moves all extremities spontaneously. No focal deficits noted. Psych:  Responds to questions appropriately with a normal affect. Skin: No rashes or lesions noted  Wt Readings from Last 3 Encounters:  02/08/21 194 lb (88 kg)  02/02/21 193 lb (87.5 kg)  01/24/21 195 lb 15.8 oz (88.9 kg)     Studies/Labs Reviewed:   EKG:  EKG is not ordered today.    Recent Labs: 10/19/2020: TSH 2.119 11/05/2020: B Natriuretic Peptide 105.0 01/25/2021: ALT 12; BUN 7; Creatinine, Ser 0.70; Hemoglobin 10.1; Magnesium 2.2; Platelets 287; Potassium 4.0; Sodium 130   Lipid Panel    Component Value Date/Time   CHOL 88 10/19/2020 0531   CHOL 78 (L) 12/12/2019 0910   TRIG 190 (  H) 10/19/2020 0531   HDL 47 10/19/2020  0531   HDL 42 12/12/2019 0910   CHOLHDL 1.9 10/19/2020 0531   VLDL 38 10/19/2020 0531   LDLCALC 3 10/19/2020 0531   LDLCALC 19 12/12/2019 0910    Additional studies/ records that were reviewed today include:   Echocardiogram: 09/2020 IMPRESSIONS     1. Left ventricular ejection fraction, by estimation, is 70 to 75%. The  left ventricle has hyperdynamic function. The left ventricle has no  regional wall motion abnormalities. There is mild left ventricular  hypertrophy of the basal-septal segment. Left  ventricular diastolic parameters are consistent with Grade I diastolic  dysfunction (impaired relaxation).   2. Right ventricular systolic function is normal. The right ventricular  size is normal.   3. The mitral valve is normal in structure. Trivial mitral valve  regurgitation. No evidence of mitral stenosis.   4. The aortic valve is tricuspid. Aortic valve regurgitation is not  visualized. Mild aortic valve sclerosis is present, with no evidence of  aortic valve stenosis.   Cardiac Catheterization: 10/2020 Prox LAD lesion is 90% stenosed. Dist LM lesion is 30% stenosed. 2nd Diag lesion is 70% stenosed. Mid LAD lesion is 50% stenosed. Dist LAD-1 lesion is 90% stenosed. Dist LAD-2 lesion is 80% stenosed. Post intervention, there is a 0% residual stenosis. 2nd Mrg lesion is 50% stenosed. Mid Cx to Dist Cx lesion is 70% stenosed. Previously placed Prox Cx to Mid Cx stent (unknown type) is widely patent. Ost Cx to Prox Cx lesion is 20% stenosed. Previously placed Ost RCA to Prox RCA stent (unknown type) is widely patent. Previously placed Dist RCA stent (unknown type) is widely patent. Prox RCA to Mid RCA lesion is 50% stenosed. Mid RCA-1 lesion is 50% stenosed. Previously placed Mid RCA-2 stent (unknown type) is widely patent. RV Branch lesion is 95% stenosed. RPDA lesion is 70% stenosed. RPAV lesion is 50% stenosed. A stent was successfully placed.   Significant  three-vessel CAD with previously placed stents in the circumflex, and RCA which remain patent.   The LAD has 30% smooth ostial narrowing followed by 90% proximal LAD stenosis before the initial septal perforating artery.  The mid LAD and diagonal vessel is previously noted 70 and 50% stenoses.  There is diffuse distal apical LAD disease of 90 and 80%.   The circumflex stent is widely patent.  There is mild proximal 20% narrowing.  There is mid AV groove circumflex stenoses of 70% with 50% OM 2 stenosis.   The RCA has patent stents at the ostium, proximal to the acute margin, and prior to the PDA vessel.  There is previously noted 50% stenoses slightly improved from previously in the mid RCA with old 95% ostial stenosis and a small marginal branch.  The PDA is small and has ostial 70% stenosis and there is 50% continuation branch stenosis in the distal RCA.   Successful PCI with DES stenting of the proximal LAD with ultimate insertion of a 4.0 x 15 mm Resolute Onyx stent postdilated with stent taper from 4.45 to 4.20 mm with residual narrowing 0% and brisk TIMI-3 flow.   LVEDP 13 mm.   RECOMMENDATION: DAPT therapy ideally for greater than 1 year.  The patient will have to defer his planned upcoming surgery and possibly this can be done after 6 months if he remains stable.  Recommend increase medical therapy for concomitant CAD with increase nitrates, beta-blocker therapy, consider initiation of ranolazine.  Continue aggressive lipid-lowering therapy with PCSK9  inhibition.  Assessment:    1. Coronary artery disease involving native coronary artery of native heart without angina pectoris   2. Other acute pulmonary embolism without acute cor pulmonale ([place])   3. Essential hypertension   4. Hyperlipidemia LDL goal <70   5. Preoperative cardiovascular examination      Plan:   In order of problems listed above:  1. CAD - He is s/p DESx3 to RCA an DES to LCx in 2011 and most recently had an  NSTEMI in 10/2020 with DES to prox-LAD.  - He remains active at baseline and denies any recent anginal symptoms.  - Continue current medication regimen with Plavix 62m daily, Imdur 34mdaily Lopressor 2586mID and Repatha (statin intolerant). Not on ASA given the need for anticoagulation.   2. Recent PE - Diagnosed in 09/2020 and previously recommended to treat for at least 6 months. He remains on Eliquis 5mg72mD.   3. HTN - His BP is well-controlled at 136/64 during today's visit. I did recommend he check this at home if able given the recent discontinuation of HCTZ and Amlodipine. Continue current medication regimen for now with Imdur 30mg17mly, Losartan 50mg 58my and Lopressor 25mg B48mIf BP remains above goal, could titrate Losartan.   4. HLD - Followed by the [facility] and on Repatha given statin intolerance. LDL was down to 3 when checked in 10/2020.  5. Cardiac Clearance for Colovesicular Fistula - Challenging situation as he experiences intermittent pain due to his fistula and says this has greatly impacted his quality of life yet he is only 3.5 months out from recent diagnosis of a PE and DES placement. I reviewed Dr. McDowelMyles Gipus recommendations with the patient that the earliest Plavix could be temporarily stopped was 6 months out from DES placement and would also anticipate at least a 29-month80-monthof Eliquis for his PE. I will send a staff message to Dr. McDowellDomenic Politeher discuss once he is back in town to see if the same recommendation stands. I informed the patient he will likely still want him to wait until 04/2021 unless his procedure becomes urgent but will await his recommendations and forward to Dr. Thomas aMarcello Moores McKenzieAlyson Inglesndum: 02/15/2021:  Reviewed with Dr. McDowellDomenic Politefelt the patient would still be at fairly high risk for in-stent thrombosis and due to incomplete treatment of his pulmonary embolism if taken off Plavix and Eliquis in less than 6  months. He said if it were an urgent issue, then could proceed but would be high risk and a reasonable option would be [facility] with inpatient cardiology consult to guide transition from antiplatelet and anticoagulation to temporary intravenous therapy. Therefore, would anticipate him being cleared for surgery in 04/2021 unless urgent in the interim. I called the patient today and reviewed recommendations with him as well. Will send a message to Dr. McKenzieAlyson Ingles Thomas tMarcello Moores them aware as he wishes to have his surgery tentatively scheduled in 04/2021.   Medication Adjustments/Labs and Tests Ordered: Current medicines are reviewed at length with the patient today.  Concerns regarding medicines are outlined above.  Medication changes, Labs and Tests ordered today are listed in the Patient Instructions below. Patient Instructions  Medication Instructions:  Your physician recommends that you continue on your current medications as directed. Please refer to the Current Medication list given to you today.  *If you need a refill  on your cardiac medications before your next appointment, please call your pharmacy*   Lab Work: None today  If you have labs (blood work) drawn today and your tests are completely normal, you will receive your results only by: [place] (if you have MyChart) OR A paper copy in the mail If you have any lab test that is abnormal or we need to change your treatment, we will call you to review the results.   Testing/Procedures: None today    Follow-Up: At [facility], you and your health needs are our priority.  As part of our continuing mission to provide you with exceptional heart care, we have created designated Provider Care Teams.  These Care Teams include your primary Cardiologist (physician) and Advanced Practice Providers (APPs -  Physician Assistants and Nurse Practitioners) who all work together to  provide you with the care you need, when you need it.  We recommend signing up for the patient portal called "MyChart".  Sign up information is provided on this After Visit Summary.  MyChart is used to connect with patients for Virtual Visits (Telemedicine).  Patients are able to view lab/test results, encounter notes, upcoming appointments, etc.  Non-urgent messages can be sent to your provider as well.   To learn more about what you can do with MyChart, go to [email or web address].    Your next appointment:   3 month(s)  The format for your next appointment:   In Person  Provider:   Rozann Lesches, MD   Other Instructions None    Signed, Erma Heritage, PA-C  02/08/2021 7:28 PM    [facility]. [address] [place], [place] [address] Phone: [phone] Fax: [phone]

## 2021-02-15 ENCOUNTER — Other Ambulatory Visit: Payer: Self-pay | Admitting: *Deleted

## 2021-02-15 DIAGNOSIS — D869 Sarcoidosis, unspecified: Secondary | ICD-10-CM | POA: Diagnosis not present

## 2021-02-15 DIAGNOSIS — J45909 Unspecified asthma, uncomplicated: Secondary | ICD-10-CM | POA: Diagnosis not present

## 2021-02-15 DIAGNOSIS — G4733 Obstructive sleep apnea (adult) (pediatric): Secondary | ICD-10-CM | POA: Diagnosis not present

## 2021-02-15 DIAGNOSIS — E782 Mixed hyperlipidemia: Secondary | ICD-10-CM

## 2021-02-24 ENCOUNTER — Telehealth: Payer: Self-pay | Admitting: Cardiology

## 2021-02-24 MED ORDER — APIXABAN 5 MG PO TABS: ORAL_TABLET | ORAL | 5 refills | Status: DC

## 2021-02-24 NOTE — Telephone Encounter (Signed)
Prescription refill request for Eliquis received. Indication: PE Last office visit: 02/08/21  [place] PAC Scr: 0.70 on 01/25/21 Age: 75 Weight: 88kg  Based on above findings Eliquis 73m twide daily is the appropriate dose.  Refill approved.

## 2021-02-24 NOTE — Telephone Encounter (Signed)
*  STAT* If patient is at the pharmacy, call can be transferred to refill team.   1. Which medications need to be refilled? (please list name of each medication and dose if known) apixaban (ELIQUIS) 5 MG TABS tablet [[id number]]   2. Which pharmacy/location (including street and city if local pharmacy) is medication to be sent to? [place]   3. Do they need a 30 day or 90 day supply?  Didn't say

## 2021-02-28 ENCOUNTER — Telehealth: Payer: Self-pay | Admitting: Cardiology

## 2021-02-28 NOTE — Telephone Encounter (Signed)
Patient called because he is down to 1 ELIQUIS Pill. He is wanting to know if we have any samples he can get because his Insurance will not cover/let him fill until 7/30, otherwise he would need to pay out of pocket over $65fr it. [facility] [place] Please call him to let him know what he should do.

## 2021-03-01 MED ORDER — APIXABAN 5 MG PO TABS: ORAL_TABLET | ORAL | 0 refills | Status: DC

## 2021-03-01 NOTE — Telephone Encounter (Signed)
Eliquis 20m - # 56 / 4 boxes - ready for pick up.  Lot #  [id number]/ [id number] Jul 2024  Documented on med list as well.

## 2021-03-01 NOTE — Telephone Encounter (Signed)
Called pt and left a detail message per [facility] stating nurse was informed by onsite nurse that samples are available for pick up.

## 2021-03-02 DIAGNOSIS — I10 Essential (primary) hypertension: Secondary | ICD-10-CM | POA: Diagnosis not present

## 2021-03-02 DIAGNOSIS — N63 Unspecified lump in unspecified breast: Secondary | ICD-10-CM | POA: Diagnosis not present

## 2021-03-02 DIAGNOSIS — Z299 Encounter for prophylactic measures, unspecified: Secondary | ICD-10-CM | POA: Diagnosis not present

## 2021-03-02 DIAGNOSIS — M1990 Unspecified osteoarthritis, unspecified site: Secondary | ICD-10-CM | POA: Diagnosis not present

## 2021-03-02 DIAGNOSIS — L4050 Arthropathic psoriasis, unspecified: Secondary | ICD-10-CM | POA: Diagnosis not present

## 2021-03-07 DIAGNOSIS — H35373 Puckering of macula, bilateral: Secondary | ICD-10-CM | POA: Diagnosis not present

## 2021-03-07 DIAGNOSIS — H43813 Vitreous degeneration, bilateral: Secondary | ICD-10-CM | POA: Diagnosis not present

## 2021-03-07 DIAGNOSIS — H26493 Other secondary cataract, bilateral: Secondary | ICD-10-CM | POA: Diagnosis not present

## 2021-03-10 ENCOUNTER — Other Ambulatory Visit: Payer: Self-pay | Admitting: Urology

## 2021-03-14 ENCOUNTER — Encounter (HOSPITAL_COMMUNITY): Payer: Medicare Other

## 2021-03-15 DIAGNOSIS — L820 Inflamed seborrheic keratosis: Secondary | ICD-10-CM | POA: Diagnosis not present

## 2021-03-15 DIAGNOSIS — L281 Prurigo nodularis: Secondary | ICD-10-CM | POA: Diagnosis not present

## 2021-03-15 DIAGNOSIS — L299 Pruritus, unspecified: Secondary | ICD-10-CM | POA: Diagnosis not present

## 2021-03-15 DIAGNOSIS — Z79899 Other long term (current) drug therapy: Secondary | ICD-10-CM | POA: Diagnosis not present

## 2021-03-15 DIAGNOSIS — L821 Other seborrheic keratosis: Secondary | ICD-10-CM | POA: Diagnosis not present

## 2021-03-15 DIAGNOSIS — L209 Atopic dermatitis, unspecified: Secondary | ICD-10-CM | POA: Diagnosis not present

## 2021-03-17 ENCOUNTER — Ambulatory Visit: Payer: Medicare Other | Admitting: Cardiology

## 2021-03-21 ENCOUNTER — Telehealth: Payer: Self-pay | Admitting: Cardiology

## 2021-03-21 NOTE — Telephone Encounter (Signed)
New message    Pt c/o swelling: STAT is pt has developed SOB within 24 hours  If swelling, where is the swelling located?  Legs are swollen and red   How much weight have you gained and in what time span? Not sure   Have you gained 3 pounds in a day or 5 pounds in a week? no  Do you have a log of your daily weights (if so, list)? no  Are you currently taking a fluid pill no they stopped it in June   Are you currently SOB? no  Have you traveled recently?  Yes - went to [place] over the weekend

## 2021-03-21 NOTE — Telephone Encounter (Signed)
I spoke with patient. He was visiting son and went to both [place] and [place] with grandchildren. He walked about 5 hours on Thursday and 8+ hours on Saturday then drove 5 hours home. He ate mainly at son's home and drank Gatorade to stay hydrated.   His weight 2 days ago was 195 lbs. His biggest complaint is that socks left deep indentations in legs and legs are now read and itching. Today feels the worst. He has compression socks that he will put on now. He no longer takes HCTZ     I will forward to B.Ahmed Prima, PA-C for advice.

## 2021-03-22 NOTE — Telephone Encounter (Signed)
Left message to return call 

## 2021-03-22 NOTE — Telephone Encounter (Signed)
I spoke with patient and he will try HCTZ for two days and let us know how he does.He started wearing compression stockings yesterday and will monitor salt intake.

## 2021-03-22 NOTE — Telephone Encounter (Signed)
    I suspect his symptoms are likely secondary to dependent edema due to being on his feet for an extended amount of time. He is on Eliquis which makes a blood clot less likely and also his symptoms seem most consistent with edema. If he still has HCTZ at home, he can try taking this over the next 1 to 2 days to see if this helps with symptoms. Would recommend wearing compression stockings and limiting sodium intake over the next few days along with elevating his lower extremities if able. Let us know if symptoms do not improve.   Signed, Erma Heritage, PA-C 03/22/2021, 7:38 AM Pager: [phone]

## 2021-03-23 ENCOUNTER — Other Ambulatory Visit (HOSPITAL_COMMUNITY): Payer: Self-pay | Admitting: Psychiatry

## 2021-03-23 ENCOUNTER — Other Ambulatory Visit (HOSPITAL_COMMUNITY): Payer: Self-pay | Admitting: Internal Medicine

## 2021-03-23 DIAGNOSIS — N6310 Unspecified lump in the right breast, unspecified quadrant: Secondary | ICD-10-CM

## 2021-03-23 DIAGNOSIS — N62 Hypertrophy of breast: Secondary | ICD-10-CM

## 2021-03-23 DIAGNOSIS — N63 Unspecified lump in unspecified breast: Secondary | ICD-10-CM

## 2021-03-28 ENCOUNTER — Ambulatory Visit: Payer: Self-pay | Admitting: General Surgery

## 2021-03-28 DIAGNOSIS — N321 Vesicointestinal fistula: Secondary | ICD-10-CM | POA: Diagnosis not present

## 2021-03-28 DIAGNOSIS — R933 Abnormal findings on diagnostic imaging of other parts of digestive tract: Secondary | ICD-10-CM | POA: Diagnosis not present

## 2021-03-28 NOTE — H&P (View-Only) (Signed)
REFERRING PHYSICIAN:  Charlotte Crumb, MD  PROVIDER:  [name], MD  MRN: [id number] DOB: 19-Aug-1946 DATE OF ENCOUNTER: 03/28/2021  Subjective   Chief Complaint: Post Operative Visit     History of Present Illness: Richard Webb is a 75 y.o. male who is seen today as an office consultation at the request of Dr. 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 He has a 30 year history of ulcerative colitis.  He presented to the office to discuss colectomy after an episode of severe diverticulitis with abscess in August 2021.  Patient underwent a colonoscopy in June 2021 which showed no active ulcerative colitis, inflammation, or dysplasia on targeted and random biopsies.  He reports irregular bowel habits with mostly formed stool.  He denies any rectal bleeding.  He recently lost his wife due to covid infection. Unfortunately, he suffered a heart attack in late February of this year.  A stent was placed.  He is now on anticoagulation for this.  He then developed acute abdominal pain and was seen in the hospital for diverticulitis and colovesical fistula formation.  He was placed on antibiotics for this.  He is having good bowel function with a low residue diet.  His symptoms are somewhat persistent.  He denies any fevers or severe abdominal pain.  He has now completed 6 months of antiplatelet therapy and has been cleared by cardiology to come off of this and proceed with surgery.   Review of Systems: A complete review of systems was obtained from the patient.  I have reviewed this information and discussed as appropriate with the patient.  See HPI as well for other ROS.    Medical History: Past Medical History:  Diagnosis Date   Arthritis    Hypertension    Sleep apnea     Patient Active Problem List  Diagnosis   Accelerating angina (CMS-HCC)   Asthma   Back pain   BPH (benign prostatic hyperplasia)   CAD (coronary artery disease), native coronary artery   Chronic  obstructive lung disease (CMS-HCC)   Dermatitis   Diverticulitis of large intestine with abscess   Essential hypertension   GERD (gastroesophageal reflux disease)   History of ulcerative colitis   Intra-abdominal abscess (CMS-HCC)   NSTEMI (non-ST elevated myocardial infarction) (CMS-HCC)   Pulmonary embolism (CMS-HCC)   Ulcerative colitis (CMS-HCC)    Past Surgical History:  Procedure Laterality Date   APPENDECTOMY     knee replaced     LAPAROSCOPIC CHOLECYSTECTOMY       Allergies  Allergen Reactions   Venom-Honey Bee Anaphylaxis   Statins-Hmg-Coa Reductase Inhibitors Other (See Comments)    Myalgias - Simvastatin and Rosuvastatin   Bronopol Rash   Latex Rash    tape    Rosuvastatin Other (See Comments)    myalgia    Simvastatin Other (See Comments)    myalgia      Current Outpatient Medications on File Prior to Visit  Medication Sig Dispense Refill   evolocumab (REPATHA SURECLICK) 725 mg/mL PnIj Inject subcutaneously     isosorbide mononitrate (IMDUR) 30 MG ER tablet Take 1 tablet by mouth once daily     nitroGLYcerin (NITROSTAT) 0.4 MG SL tablet Place under the tongue     acetaminophen (TYLENOL) 500 MG tablet Take by mouth     adalimumab (HUMIRA) 20 mg/0.4 mL prefilled syringe kit Inject subcutaneously     amLODIPine (NORVASC) 10 MG tablet      azithromycin (ZITHROMAX) 500 MG tablet Take 500 mg  by mouth once daily     BABY ASPIRIN ORAL Baby Aspirin     budesonide-formoteroL (SYMBICORT) 160-4.5 mcg/actuation inhaler Symbicort     carbidopa-levodopa (SINEMET) 10-100 mg tablet      cephalexin (KEFLEX) 500 MG capsule      cetirizine (ZYRTEC) 10 MG tablet Take 10 mg by mouth     dexAMETHasone (DECADRON) 10 mg/mL injection dexamethasone sodium phosphate 10 mg/mL injection solution  Take 5 mg by injection route.     dicyclomine HCl (BENTYL IM) Bentyl     dupilumab (DUPIXENT) 300 mg/2 mL pen injector Inject subcutaneously     ELIQUIS 5 mg tablet Take 5 mg by mouth 2  (two) times daily     ergocalciferol, vit D2,, bulk, Powd Take by mouth     folic acid (FOLVITE) 1 MG tablet Take by mouth     gabapentin (NEURONTIN) 300 MG capsule gabapentin 300 mg capsule  Take 1 capsule 3 times a day by oral route as needed.     HYDROcodone-acetaminophen (LORTAB) 10-300 mg/15 mL solution Hydrocodone     ketorolac (TORADOL) 60 mg/2 mL injection ketorolac 60 mg/2 mL intramuscular solution  Inject 2 mL by intramuscular route.  do not take meloxicam today     losartan potassium (COZAAR ORAL) Cozaar     meloxicam 15 mg TbDL meloxicam     mesalamine (LIALDA) 1.2 gram EC tablet Lialda     methocarbamoL (ROBAXIN) 500 MG tablet methocarbamol 500 mg tablet  Take 2 tablets 4 times a day by oral route.     methylPREDNISolone acetate (DEPO-MEDROL) 80 mg/mL injection Depo-Medrol 80 mg/mL suspension for injection  Take 40 mg by injection route.     montelukast sodium (SINGULAIR ORAL) Singulair     morphine 10 mg/mL injection morphine 10 mg/mL injection solution  Take 5 mg by injection route.     oxymetazoline (AFRIN) 0.05 % nasal spray Place into one nostril     temazepam (RESTORIL ORAL) Restoril     No current facility-administered medications on file prior to visit.    Family History  Problem Relation Age of Onset   Hyperlipidemia (Elevated cholesterol) Mother    High blood pressure (Hypertension) Mother    High blood pressure (Hypertension) Father    Hyperlipidemia (Elevated cholesterol) Father    Coronary Artery Disease (Blocked arteries around heart) Father      Social History   Tobacco Use  Smoking Status Never Smoker  Smokeless Tobacco Never Used     Social History   Socioeconomic History   Marital status: Widowed  Tobacco Use   Smoking status: Never Smoker   Smokeless tobacco: Never Used  Substance and Sexual Activity   Alcohol use: Never   Drug use: Never    Objective:    Vitals:   03/28/21 1136  BP: (!) 140/60  Pulse: 61  Temp: 36.1 C (96.9  F)  SpO2: 97%  Weight: 88.7 kg (195 lb 9.6 oz)  Height: 182.9 cm (6')     Exam Gen: NAD CV: RRR Lungs: CTA Abd: soft, nontender    Labs, Imaging and Diagnostic Testing: Most recent CT reviewed  Assessment and Plan:  Diagnoses and all orders for this visit:  Colovesical fistula -     CT abdomen pelvis with contrast  Abnormal findings on diagnostic imaging of other parts of digestive tract  -     CT abdomen pelvis with contrast    Will repeat CT for surgical planning.  Pt's last CT shows liver nodularity  concerning for cirrhosis.  Will plan on liver biopsy if looks abnormal during surgery.  Will plan on robotic assisted surgical resection with firefly injection.  We have discussed this in detail.  All questions answered.    The surgery and anatomy were described to the patient as well as the risks of surgery and the possible complications.  These include: Bleeding, deep abdominal infections and possible wound complications such as hernia and infection, damage to adjacent structures, leak of surgical connections, which can lead to other surgeries and possibly an ostomy, possible need for other procedures, such as abscess drains in radiology, possible prolonged hospital stay, possible diarrhea from removal of part of the colon, possible constipation from narcotics, possible bowel, bladder or sexual dysfunction if having rectal surgery, prolonged fatigue/weakness or appetite loss, possible early recurrence of of disease, possible complications of their medical problems such as heart disease or arrhythmias or lung problems, death (less than 1%). I believe the patient understands and wishes to proceed with the surgery.

## 2021-03-28 NOTE — H&P (Signed)
REFERRING PHYSICIAN:  Charlotte Crumb, MD  PROVIDER:  [name], MD  MRN: [id number] DOB: 19-Aug-1946 DATE OF ENCOUNTER: 03/28/2021  Subjective   Chief Complaint: Post Operative Visit     History of Present Illness: Richard Webb is a 75 y.o. male who is seen today as an office consultation at the request of Dr. 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 He has a 30 year history of ulcerative colitis.  He presented to the office to discuss colectomy after an episode of severe diverticulitis with abscess in August 2021.  Patient underwent a colonoscopy in June 2021 which showed no active ulcerative colitis, inflammation, or dysplasia on targeted and random biopsies.  He reports irregular bowel habits with mostly formed stool.  He denies any rectal bleeding.  He recently lost his wife due to covid infection. Unfortunately, he suffered a heart attack in late February of this year.  A stent was placed.  He is now on anticoagulation for this.  He then developed acute abdominal pain and was seen in the hospital for diverticulitis and colovesical fistula formation.  He was placed on antibiotics for this.  He is having good bowel function with a low residue diet.  His symptoms are somewhat persistent.  He denies any fevers or severe abdominal pain.  He has now completed 6 months of antiplatelet therapy and has been cleared by cardiology to come off of this and proceed with surgery.   Review of Systems: A complete review of systems was obtained from the patient.  I have reviewed this information and discussed as appropriate with the patient.  See HPI as well for other ROS.    Medical History: Past Medical History:  Diagnosis Date   Arthritis    Hypertension    Sleep apnea     Patient Active Problem List  Diagnosis   Accelerating angina (CMS-HCC)   Asthma   Back pain   BPH (benign prostatic hyperplasia)   CAD (coronary artery disease), native coronary artery   Chronic  obstructive lung disease (CMS-HCC)   Dermatitis   Diverticulitis of large intestine with abscess   Essential hypertension   GERD (gastroesophageal reflux disease)   History of ulcerative colitis   Intra-abdominal abscess (CMS-HCC)   NSTEMI (non-ST elevated myocardial infarction) (CMS-HCC)   Pulmonary embolism (CMS-HCC)   Ulcerative colitis (CMS-HCC)    Past Surgical History:  Procedure Laterality Date   APPENDECTOMY     knee replaced     LAPAROSCOPIC CHOLECYSTECTOMY       Allergies  Allergen Reactions   Venom-Honey Bee Anaphylaxis   Statins-Hmg-Coa Reductase Inhibitors Other (See Comments)    Myalgias - Simvastatin and Rosuvastatin   Bronopol Rash   Latex Rash    tape    Rosuvastatin Other (See Comments)    myalgia    Simvastatin Other (See Comments)    myalgia      Current Outpatient Medications on File Prior to Visit  Medication Sig Dispense Refill   evolocumab (REPATHA SURECLICK) 725 mg/mL PnIj Inject subcutaneously     isosorbide mononitrate (IMDUR) 30 MG ER tablet Take 1 tablet by mouth once daily     nitroGLYcerin (NITROSTAT) 0.4 MG SL tablet Place under the tongue     acetaminophen (TYLENOL) 500 MG tablet Take by mouth     adalimumab (HUMIRA) 20 mg/0.4 mL prefilled syringe kit Inject subcutaneously     amLODIPine (NORVASC) 10 MG tablet      azithromycin (ZITHROMAX) 500 MG tablet Take 500 mg  by mouth once daily     BABY ASPIRIN ORAL Baby Aspirin     budesonide-formoteroL (SYMBICORT) 160-4.5 mcg/actuation inhaler Symbicort     carbidopa-levodopa (SINEMET) 10-100 mg tablet      cephalexin (KEFLEX) 500 MG capsule      cetirizine (ZYRTEC) 10 MG tablet Take 10 mg by mouth     dexAMETHasone (DECADRON) 10 mg/mL injection dexamethasone sodium phosphate 10 mg/mL injection solution  Take 5 mg by injection route.     dicyclomine HCl (BENTYL IM) Bentyl     dupilumab (DUPIXENT) 300 mg/2 mL pen injector Inject subcutaneously     ELIQUIS 5 mg tablet Take 5 mg by mouth 2  (two) times daily     ergocalciferol, vit D2,, bulk, Powd Take by mouth     folic acid (FOLVITE) 1 MG tablet Take by mouth     gabapentin (NEURONTIN) 300 MG capsule gabapentin 300 mg capsule  Take 1 capsule 3 times a day by oral route as needed.     HYDROcodone-acetaminophen (LORTAB) 10-300 mg/15 mL solution Hydrocodone     ketorolac (TORADOL) 60 mg/2 mL injection ketorolac 60 mg/2 mL intramuscular solution  Inject 2 mL by intramuscular route.  do not take meloxicam today     losartan potassium (COZAAR ORAL) Cozaar     meloxicam 15 mg TbDL meloxicam     mesalamine (LIALDA) 1.2 gram EC tablet Lialda     methocarbamoL (ROBAXIN) 500 MG tablet methocarbamol 500 mg tablet  Take 2 tablets 4 times a day by oral route.     methylPREDNISolone acetate (DEPO-MEDROL) 80 mg/mL injection Depo-Medrol 80 mg/mL suspension for injection  Take 40 mg by injection route.     montelukast sodium (SINGULAIR ORAL) Singulair     morphine 10 mg/mL injection morphine 10 mg/mL injection solution  Take 5 mg by injection route.     oxymetazoline (AFRIN) 0.05 % nasal spray Place into one nostril     temazepam (RESTORIL ORAL) Restoril     No current facility-administered medications on file prior to visit.    Family History  Problem Relation Age of Onset   Hyperlipidemia (Elevated cholesterol) Mother    High blood pressure (Hypertension) Mother    High blood pressure (Hypertension) Father    Hyperlipidemia (Elevated cholesterol) Father    Coronary Artery Disease (Blocked arteries around heart) Father      Social History   Tobacco Use  Smoking Status Never Smoker  Smokeless Tobacco Never Used     Social History   Socioeconomic History   Marital status: Widowed  Tobacco Use   Smoking status: Never Smoker   Smokeless tobacco: Never Used  Substance and Sexual Activity   Alcohol use: Never   Drug use: Never    Objective:    Vitals:   03/28/21 1136  BP: (!) 140/60  Pulse: 61  Temp: 36.1 C (96.9  F)  SpO2: 97%  Weight: 88.7 kg (195 lb 9.6 oz)  Height: 182.9 cm (6')     Exam Gen: NAD CV: RRR Lungs: CTA Abd: soft, nontender    Labs, Imaging and Diagnostic Testing: Most recent CT reviewed  Assessment and Plan:  Diagnoses and all orders for this visit:  Colovesical fistula -     CT abdomen pelvis with contrast  Abnormal findings on diagnostic imaging of other parts of digestive tract  -     CT abdomen pelvis with contrast    Will repeat CT for surgical planning.  Pt's last CT shows liver nodularity  concerning for cirrhosis.  Will plan on liver biopsy if looks abnormal during surgery.  Will plan on robotic assisted surgical resection with firefly injection.  We have discussed this in detail.  All questions answered.    The surgery and anatomy were described to the patient as well as the risks of surgery and the possible complications.  These include: Bleeding, deep abdominal infections and possible wound complications such as hernia and infection, damage to adjacent structures, leak of surgical connections, which can lead to other surgeries and possibly an ostomy, possible need for other procedures, such as abscess drains in radiology, possible prolonged hospital stay, possible diarrhea from removal of part of the colon, possible constipation from narcotics, possible bowel, bladder or sexual dysfunction if having rectal surgery, prolonged fatigue/weakness or appetite loss, possible early recurrence of of disease, possible complications of their medical problems such as heart disease or arrhythmias or lung problems, death (less than 1%). I believe the patient understands and wishes to proceed with the surgery.

## 2021-03-29 ENCOUNTER — Other Ambulatory Visit: Payer: Self-pay | Admitting: Internal Medicine

## 2021-03-29 DIAGNOSIS — I2510 Atherosclerotic heart disease of native coronary artery without angina pectoris: Secondary | ICD-10-CM

## 2021-03-29 DIAGNOSIS — I214 Non-ST elevation (NSTEMI) myocardial infarction: Secondary | ICD-10-CM

## 2021-03-31 ENCOUNTER — Other Ambulatory Visit (HOSPITAL_COMMUNITY)
Admission: RE | Admit: 2021-03-31 | Discharge: 2021-03-31 | Disposition: A | Discharge status: Home / Self Care | Payer: Medicare Other | Source: Ambulatory Visit | Attending: Student | Admitting: Student

## 2021-03-31 ENCOUNTER — Other Ambulatory Visit: Payer: Self-pay | Admitting: Cardiology

## 2021-03-31 ENCOUNTER — Telehealth: Payer: Self-pay | Admitting: Internal Medicine

## 2021-03-31 DIAGNOSIS — E782 Mixed hyperlipidemia: Secondary | ICD-10-CM | POA: Insufficient documentation

## 2021-03-31 DIAGNOSIS — I200 Unstable angina: Secondary | ICD-10-CM

## 2021-03-31 LAB — LIPID PANEL
Cholesterol: 91 mg/dL (ref 0–200)
HDL: 43 mg/dL (ref >40)
LDL Cholesterol: 29 mg/dL (ref 0–99)
Total CHOL/HDL Ratio: 2.1 RATIO
Triglycerides: 94 mg/dL (ref <150)
VLDL: 19 mg/dL (ref 0–40)

## 2021-03-31 LAB — HEPATIC FUNCTION PANEL
ALT: 12 U/L (ref 0–44)
AST: 19 U/L (ref 15–41)
Albumin: 4.1 g/dL (ref 3.5–5.0)
Alkaline Phosphatase: 95 U/L (ref 38–126)
Bilirubin, Direct: 0.1 mg/dL (ref 0.0–0.2)
Indirect Bilirubin: 0.6 mg/dL (ref 0.3–0.9)
Total Bilirubin: 0.7 mg/dL (ref 0.3–1.2)
Total Protein: 7.1 g/dL (ref 6.5–8.1)

## 2021-03-31 NOTE — Telephone Encounter (Signed)
The patient has been made aware that the orders are already placed for [facility] lab. He stated that he has been there before and will go today for his labs.

## 2021-03-31 NOTE — Telephone Encounter (Signed)
New message:    Patient has to get labs done and he is asking for a order to get it done in [place] patient lives in [place].

## 2021-04-01 ENCOUNTER — Other Ambulatory Visit: Payer: Self-pay | Admitting: General Surgery

## 2021-04-01 DIAGNOSIS — N321 Vesicointestinal fistula: Secondary | ICD-10-CM

## 2021-04-01 DIAGNOSIS — R933 Abnormal findings on diagnostic imaging of other parts of digestive tract: Secondary | ICD-10-CM

## 2021-04-04 DIAGNOSIS — I10 Essential (primary) hypertension: Secondary | ICD-10-CM | POA: Diagnosis not present

## 2021-04-04 DIAGNOSIS — K7460 Unspecified cirrhosis of liver: Secondary | ICD-10-CM | POA: Diagnosis not present

## 2021-04-04 DIAGNOSIS — Z299 Encounter for prophylactic measures, unspecified: Secondary | ICD-10-CM | POA: Diagnosis not present

## 2021-04-04 DIAGNOSIS — K5190 Ulcerative colitis, unspecified, without complications: Secondary | ICD-10-CM | POA: Diagnosis not present

## 2021-04-08 ENCOUNTER — Other Ambulatory Visit: Payer: Self-pay

## 2021-04-08 ENCOUNTER — Ambulatory Visit (INDEPENDENT_AMBULATORY_CARE_PROVIDER_SITE_OTHER): Payer: Medicare Other | Admitting: Internal Medicine

## 2021-04-08 VITALS — BP 126/68 | HR 57 | Ht 72.0 in | Wt 198.4 lb

## 2021-04-08 DIAGNOSIS — I2510 Atherosclerotic heart disease of native coronary artery without angina pectoris: Secondary | ICD-10-CM | POA: Diagnosis not present

## 2021-04-08 DIAGNOSIS — I200 Unstable angina: Secondary | ICD-10-CM | POA: Diagnosis not present

## 2021-04-08 DIAGNOSIS — T466X5D Adverse effect of antihyperlipidemic and antiarteriosclerotic drugs, subsequent encounter: Secondary | ICD-10-CM

## 2021-04-08 DIAGNOSIS — E782 Mixed hyperlipidemia: Secondary | ICD-10-CM | POA: Diagnosis not present

## 2021-04-08 DIAGNOSIS — M791 Myalgia, unspecified site: Secondary | ICD-10-CM | POA: Diagnosis not present

## 2021-04-08 NOTE — Progress Notes (Signed)
LIPID CLINIC CONSULT NOTE  Chief Complaint:  Manage dyslipidemia  Primary Care Physician: [name], MD  Primary Cardiologist:  Rozann Lesches, MD  HPI:  Richard Webb is a 75 y.o. male who is being seen today for the evaluation of dyslipidemia at the request of [name], MD.  This is a pleasant 75 year old male who is currently referred for management of dyslipidemia.  He has a history of coronary artery disease with prior stents back in [place], he also has hypertension, dyslipidemia, pulmonary hypertension, sarcoidosis and obstructive sleep apnea.  He has been on longstanding statin therapy however has had recurrent intolerance.  In the past has had myalgias with simvastatin, rosuvastatin and most recently pravastatin.  He has had reasonable control of his cholesterol in the past, most recently his lipid showed total cholesterol 153, triglycerides 128, HDL 47 and LDL 80.  This last lipid profile was in August 2020.  He was referred for evaluation of possible PCSK9 inhibitor therapy.  We discussed that at some length today.  He is familiar with injections since he uses Dupixent.  Currently he is on ezetimibe which she was taking when he had his prior lipid profile.  His target LDL is less than 70 and he remains above that.  12/19/19  Richard Webb returns today for follow-up.  Overall he is doing well.  He is tolerating Repatha without any significant side effects.  He has had an expected marked reduction in LDL cholesterol from 90-19.  Total cholesterol 78, triglycerides 82 and HDL of 42.  Based on the significant reduction, at this point I recommend stopping his ezetimibe.  We did provide some financial assistance information.  04/08/2021  Richard Webb is seen today in follow-up.  He continues to have well-controlled dyslipidemia.  Total cholesterol now 91, HDL 43, LDL 29 (up from 3 after discontinuing ezetimibe) and triglycerides 94.  Overall very well-controlled  lipid profile.  He feels well on the medication.  The only issue is cost.  Recently his grant had expired.  He said cost is gone up to over $100 a month.  Overall he does not think this will be sustainable long-term.  PMHx:  Past Medical History:  Diagnosis Date   Arthritis    Asthma    Coronary atherosclerosis of native coronary artery    a. DES x 3 RCA and DES mCx 10/11 - [place] - with residual 50-70% stenosis in the distal Cx in AV groove, 70% distal LAD beyond apex, 70% small D1. b. 12/2017: similar results with multivessel CAD. Patent stents. Medical management recommended.  c. s/p NSTEMI in 10/2020 with DES to prox-LAD   Diverticulitis    GERD (gastroesophageal reflux disease)    History of kidney stones    Hypertension    Mixed hyperlipidemia    Myocardial infarction ([place])    Obstructive sleep apnea    Pulmonary hypertension (HCC)    PASP 50 mmHg   Restless leg syndrome    Sarcoidosis    Statin intolerance    Ulcerative colitis ([place])     Past Surgical History:  Procedure Laterality Date   Ankle fracture repair Left 1985   CARDIAC CATHETERIZATION     stents x4   CHOLECYSTECTOMY  1970   CORONARY STENT INTERVENTION N/A 10/19/2020   Procedure: CORONARY STENT INTERVENTION;  Surgeon: Troy Sine, MD;  Location: [facility];  Service: Cardiovascular;  Laterality: N/A;   CYSTOSCOPY WITH INSERTION OF UROLIFT N/A 08/27/2017   Procedure:  CYSTOSCOPY WITH INSERTION OF UROLIFT;  Surgeon: Cleon Gustin, MD;  Location: [facility];  Service: Urology;  Laterality: N/A;   JOINT REPLACEMENT     LAPAROSCOPIC APPENDECTOMY  2013   LEFT HEART CATH AND CORONARY ANGIOGRAPHY N/A 01/02/2018   Procedure: LEFT HEART CATH AND CORONARY ANGIOGRAPHY;  Surgeon: Troy Sine, MD;  Location: [facility];  Service: Cardiovascular;  Laterality: N/A;   LEFT HEART CATH AND CORONARY ANGIOGRAPHY N/A 10/19/2020   Procedure: LEFT HEART CATH AND CORONARY ANGIOGRAPHY;  Surgeon: Troy Sine, MD;   Location: [facility];  Service: Cardiovascular;  Laterality: N/A;   Right total knee replacement Bilateral 2005    FAMHx:  Family History  Problem Relation Age of Onset   Colon cancer Mother    CAD Father     SOCHx:   reports that he has never smoked. He has never used smokeless tobacco. He reports that he does not drink alcohol and does not use drugs.  ALLERGIES:  Allergies  Allergen Reactions   Bee Venom Anaphylaxis   Statins Other (See Comments)    Myalgias - Simvastatin and Rosuvastatin    ROS: Pertinent items noted in HPI and remainder of comprehensive ROS otherwise negative.  HOME MEDS: Current Outpatient Medications on File Prior to Visit  Medication Sig Dispense Refill   acetaminophen (TYLENOL) 500 MG tablet Take 1,000 mg by mouth 2 (two) times daily as needed for moderate pain.     apixaban (ELIQUIS) 5 MG TABS tablet Take 2 tablets twice daily until 3/7. On 3/8 start taking 1 tablet twice daily . (Patient taking differently: Take 5 mg by mouth 2 (two) times daily.) 56 tablet 0   BREO ELLIPTA 200-25 MCG/INH AEPB Inhale 1 Inhaler into the lungs daily.  3   carbidopa-levodopa (SINEMET IR) 10-100 MG per tablet Take 1 tablet by mouth at bedtime.     clopidogrel (PLAVIX) 75 MG tablet Take 1 tablet (75 mg total) by mouth daily. 30 tablet 5   cyanocobalamin (,VITAMIN B-12,) 1000 MCG/ML injection Inject 1,000 mcg into the muscle every 30 (thirty) days.     Dupilumab 300 MG/2ML SOPN Inject 300 mg into the skin every 14 (fourteen) days.     famotidine (PEPCID) 20 MG tablet Take 1 tablet (20 mg total) by mouth every evening. 30 tablet 3   finasteride (PROSCAR) 5 MG tablet Take 1 tablet (5 mg total) by mouth daily. 90 tablet 3   folic acid (FOLVITE) 1 MG tablet Take 1 mg by mouth See admin instructions. Mon - Fri     isosorbide mononitrate (IMDUR) 30 MG 24 hr tablet TAKE 1 TABLET BY MOUTH  DAILY 90 tablet 3   losartan (COZAAR) 50 MG tablet TAKE 1 TABLET BY MOUTH  DAILY 90  tablet 3   metoprolol tartrate (LOPRESSOR) 25 MG tablet TAKE 1 TABLET (25 MG TOTAL) BY MOUTH TWO TIMES DAILY. 60 tablet 3   montelukast (SINGULAIR) 10 MG tablet Take 10 mg by mouth at bedtime.     nitroGLYCERIN (NITROSTAT) 0.4 MG SL tablet Place 1 tablet (0.4 mg total) under the tongue every 5 (five) minutes x 3 doses as needed for chest pain (if no relief after 3rd dose, proceed to the ED for an evaluation or call 911). 90 tablet 3   omeprazole (PRILOSEC) 40 MG capsule Take 40 mg by mouth daily.     oxymetazoline (AFRIN) 0.05 % nasal spray Place 1 spray into both nostrils 2 (two) times daily as  needed for congestion.     phenazopyridine (PYRIDIUM) 100 MG tablet Take 1 tablet (100 mg total) by mouth 2 (two) times daily as needed for pain. 30 tablet 2   REPATHA SURECLICK 735 MG/ML SOAJ INJECT CONTENTS OF 1 PEN UNDER THE SKIN EVERY 14 DAYS 6 mL 0   sulfaSALAzine (AZULFIDINE) 500 MG tablet Take 1,000 mg by mouth 2 (two) times daily.     temazepam (RESTORIL) 30 MG capsule Take 30 mg by mouth at bedtime.     Vitamin D, Ergocalciferol, (DRISDOL) 50000 UNITS CAPS capsule Take 50,000 Units by mouth every 7 (seven) days. Thursdays     HYDROcodone-acetaminophen (NORCO/VICODIN) 5-325 MG tablet Take 1 tablet by mouth every 6 (six) hours as needed for severe pain. (Patient not taking: Reported on 04/08/2021)     No current facility-administered medications on file prior to visit.    LABS/IMAGING: No results found for this or any previous visit (from the past 48 hour(s)). No results found.  LIPID PANEL:    Component Value Date/Time   CHOL 91 03/31/2021 1128   CHOL 78 (L) 12/12/2019 0910   TRIG 94 03/31/2021 1128   HDL 43 03/31/2021 1128   HDL 42 12/12/2019 0910   CHOLHDL 2.1 03/31/2021 1128   VLDL 19 03/31/2021 1128   LDLCALC 29 03/31/2021 1128   LDLCALC 19 12/12/2019 0910    WEIGHTS: Wt Readings from Last 3 Encounters:  04/08/21 198 lb 6.4 oz (90 kg)  02/08/21 194 lb (88 kg)  02/02/21 193 lb  (87.5 kg)    VITALS: BP 126/68   Pulse (!) 57   Ht 6' (1.829 m)   Wt 198 lb 6.4 oz (90 kg)   SpO2 97%   BMI 26.91 kg/m   EXAM: Deferred  EKG: Deferred  ASSESSMENT: Mixed dyslipidemia, goal LDL <70 CAD with prior PCI Statin intolerance - myalgias, on zetia  PLAN: 1.   Richard Webb has had very good control over his dyslipidemia on Repatha.  Cholesterol is well controlled.  He is tolerating the medication without side effects.  Cost is the only thing that may be an issue.  Will try to provide him with grant information however there are few with funding at this time.  Plan repeat lipids and follow-up with me in 1 year or sooner as necessary.  Pixie Casino, MD, [facility], [place] Director of the Advanced Lipid Disorders &  Cardiovascular Risk Reduction Clinic Diplomate of the American Board of Clinical Lipidology Attending Cardiologist  Direct Dial: [phone]  Fax: [phone]  Website:  [email or web address] 04/08/2021, 9:15 AM

## 2021-04-08 NOTE — Patient Instructions (Signed)
DUE TO COVID-19 ONLY ONE VISITOR IS ALLOWED TO COME WITH YOU AND STAY IN THE WAITING ROOM ONLY DURING PRE OP AND PROCEDURE.   **NO VISITORS ARE ALLOWED IN THE SHORT STAY AREA OR RECOVERY ROOM!!**  IF YOU WILL BE ADMITTED INTO THE HOSPITAL YOU ARE ALLOWED ONLY TWO SUPPORT PEOPLE DURING VISITATION HOURS ONLY (10AM -8PM)   The support person(s) may change daily. The support person(s) must pass our screening, gel in and out, and wear a mask at all times, including in the patient's room. Patients must also wear a mask when staff or their support person are in the room.  No visitors under the age of 52. Any visitor under the age of 57 must be accompanied by an adult.          Your procedure is scheduled on: 04/22/21   Report to [facility]  Entrance   Report to Short Stay at 5:15 AM   [facility])    Call this number if you have problems the morning of surgery [place].    May have liquids until : 4:30 AM   day of surgery  CLEAR LIQUID DIET  Foods Allowed                                                                     Foods Excluded  Water, Black Coffee and tea, regular and decaf                             liquids that you cannot  Plain Jell-O in any flavor  (No red)                                           see through such as: Fruit ices (not with fruit pulp)                                     milk, soups, orange juice              Iced Popsicles (No red)                                    All solid food                                   Apple juices Sports drinks like Gatorade (No red) Lightly seasoned clear broth or consume(fat free) Sugar, honey syrup  Sample Menu Breakfast                                Lunch  Supper Cranberry juice                    Beef broth                            Chicken broth Jell-O                                     Grape juice                            Apple juice Coffee or tea                        Jell-O                                      Popsicle                                                Coffee or tea                        Coffee or tea      Drink 2 Ensure drinks the night before surgery BY 10:00 pm.  Complete one Ensure drink the morning of surgery 3 hours prior to scheduled surgery: 4:30 AM.    The day of surgery:  Drink ONE (1) Pre-Surgery Clear Ensure or G2 by am the morning of surgery. Drink in one sitting. Do not sip.  This drink was given to you during your hospital  pre-op appointment visit. Nothing else to drink after completing the  Pre-Surgery Clear Ensure or G2.          If you have questions, please contact your surgeon's office.     Oral Hygiene is also important to reduce your risk of infection.                                    Remember - BRUSH YOUR TEETH THE MORNING OF SURGERY WITH YOUR REGULAR TOOTHPASTE   Do NOT smoke after Midnight   Take these medicines the morning of surgery with A SIP OF WATER: ISOSORBIDE,METOPROLOL,FINASTERIDE,OMEPRAZOLE.uSE INHALERS AS USUAL.                              You may not have any metal on your body including hair pins, jewelry, and body piercing             Do not wear lotions, powders, perfumes/cologne, or deodorant               Men may shave face and neck.   Do not bring valuables to the hospital. [place].   Contacts, dentures or bridgework may not be worn into surgery.   Bring small overnight bag day of surgery.    Patients discharged the day  of surgery will not be allowed to drive home.   Special Instructions: Bring a copy of your healthcare power of attorney and living will documents         the day of surgery if you haven't scanned them in before.              Please read over the following fact sheets you were given: IF YOU HAVE QUESTIONS ABOUT YOUR PRE OP INSTRUCTIONS PLEASE CALL  [phone]   [place] - Preparing for Surgery Before surgery, you can play an important role.  Because skin is not sterile, your skin needs to be as free of germs as possible.  You can reduce the number of germs on your skin by washing with CHG (chlorahexidine gluconate) soap before surgery.  CHG is an antiseptic cleaner which kills germs and bonds with the skin to continue killing germs even after washing. Please DO NOT use if you have an allergy to CHG or antibacterial soaps.  If your skin becomes reddened/irritated stop using the CHG and inform your nurse when you arrive at Short Stay. Do not shave (including legs and underarms) for at least 48 hours prior to the first CHG shower.  You may shave your face/neck. Please follow these instructions carefully:  1.  Shower with CHG Soap the night before surgery and the  morning of Surgery.  2.  If you choose to wash your hair, wash your hair first as usual with your  normal  shampoo.  3.  After you shampoo, rinse your hair and body thoroughly to remove the  shampoo.                           4.  Use CHG as you would any other liquid soap.  You can apply chg directly  to the skin and wash                       Gently with a scrungie or clean washcloth.  5.  Apply the CHG Soap to your body ONLY FROM THE NECK DOWN.   Do not use on face/ open                           Wound or open sores. Avoid contact with eyes, ears mouth and genitals (private parts).                       Wash face,  Genitals (private parts) with your normal soap.             6.  Wash thoroughly, paying special attention to the area where your surgery  will be performed.  7.  Thoroughly rinse your body with warm water from the neck down.  8.  DO NOT shower/wash with your normal soap after using and rinsing off  the CHG Soap.                9.  Pat yourself dry with a clean towel.            10.  Wear clean pajamas.            11.  Place clean sheets on your bed the night of your  first shower and do not  sleep with pets. Day of Surgery : Do not apply any lotions/deodorants the morning of surgery.  Please wear clean clothes  to the hospital/surgery center.  FAILURE TO FOLLOW THESE INSTRUCTIONS MAY RESULT IN THE CANCELLATION OF YOUR SURGERY PATIENT SIGNATURE_________________________________  NURSE SIGNATURE__________________________________  ________________________________________________________________________   Richard Webb  An incentive spirometer is a tool that can help keep your lungs clear and active. This tool measures how well you are filling your lungs with each breath. Taking long deep breaths may help reverse or decrease the chance of developing breathing (pulmonary) problems (especially infection) following: A long period of time when you are unable to move or be active. BEFORE THE PROCEDURE  If the spirometer includes an indicator to show your best effort, your nurse or respiratory therapist will set it to a desired goal. If possible, sit up straight or lean slightly forward. Try not to slouch. Hold the incentive spirometer in an upright position. INSTRUCTIONS FOR USE  Sit on the edge of your bed if possible, or sit up as far as you can in bed or on a chair. Hold the incentive spirometer in an upright position. Breathe out normally. Place the mouthpiece in your mouth and seal your lips tightly around it. Breathe in slowly and as deeply as possible, raising the piston or the ball toward the top of the column. Hold your breath for 3-5 seconds or for as long as possible. Allow the piston or ball to fall to the bottom of the column. Remove the mouthpiece from your mouth and breathe out normally. Rest for a few seconds and repeat Steps 1 through 7 at least 10 times every 1-2 hours when you are awake. Take your time and take a few normal breaths between deep breaths. The spirometer may include an indicator to show your best effort. Use the indicator  as a goal to work toward during each repetition. After each set of 10 deep breaths, practice coughing to be sure your lungs are clear. If you have an incision (the cut made at the time of surgery), support your incision when coughing by placing a pillow or rolled up towels firmly against it. Once you are able to get out of bed, walk around indoors and cough well. You may stop using the incentive spirometer when instructed by your caregiver.  RISKS AND COMPLICATIONS Take your time so you do not get dizzy or light-headed. If you are in pain, you may need to take or ask for pain medication before doing incentive spirometry. It is harder to take a deep breath if you are having pain. AFTER USE Rest and breathe slowly and easily. It can be helpful to keep track of a log of your progress. Your caregiver can provide you with a simple table to help with this. If you are using the spirometer at home, follow these instructions: [place] IF:  You are having difficultly using the spirometer. You have trouble using the spirometer as often as instructed. Your pain medication is not giving enough relief while using the spirometer. You develop fever of 100.5 F (38.1 C) or higher. SEEK IMMEDIATE MEDICAL CARE IF:  You cough up bloody sputum that had not been present before. You develop fever of 102 F (38.9 C) or greater. You develop worsening pain at or near the incision site. MAKE SURE YOU:  Understand these instructions. Will watch your condition. Will get help right away if you are not doing well or get worse. Document Released: 12/18/2006 Document Revised: 10/30/2011 Document Reviewed: 02/18/2007 [facility] Patient Information 2014 [place], [place].   ________________________________________________________________________

## 2021-04-08 NOTE — Patient Instructions (Signed)
Medication Instructions:  Your Physician recommend you continue on your current medication as directed.    *If you need a refill on your cardiac medications before your next appointment, please call your pharmacy*   Lab Work: None ordered today   Testing/Procedures: None ordered today    Follow-Up: At [facility], you and your health needs are our priority.  As part of our continuing mission to provide you with exceptional heart care, we have created designated Provider Care Teams.  These Care Teams include your primary Cardiologist (physician) and Advanced Practice Providers (APPs -  Physician Assistants and Nurse Practitioners) who all work together to provide you with the care you need, when you need it.  We recommend signing up for the patient portal called "MyChart".  Sign up information is provided on this After Visit Summary.  MyChart is used to connect with patients for Virtual Visits (Telemedicine).  Patients are able to view lab/test results, encounter notes, upcoming appointments, etc.  Non-urgent messages can be sent to your provider as well.   To learn more about what you can do with MyChart, go to [email or web address].    Your next appointment:   1 year(s)  The format for your next appointment:   In Person  Provider:   K. Mali Hilty, MD

## 2021-04-11 ENCOUNTER — Encounter (HOSPITAL_COMMUNITY)
Admission: RE | Admit: 2021-04-11 | Discharge: 2021-04-11 | Disposition: A | Discharge status: Home / Self Care | Payer: Medicare Other | Source: Ambulatory Visit | Attending: General Surgery | Admitting: General Surgery

## 2021-04-11 ENCOUNTER — Other Ambulatory Visit: Payer: Self-pay

## 2021-04-11 ENCOUNTER — Encounter (HOSPITAL_COMMUNITY): Payer: Self-pay

## 2021-04-11 DIAGNOSIS — Z01812 Encounter for preprocedural laboratory examination: Secondary | ICD-10-CM | POA: Insufficient documentation

## 2021-04-11 HISTORY — DX: Angina pectoris, unspecified: I20.9

## 2021-04-11 LAB — CBC
HCT: 36.2 % — ABNORMAL LOW (ref 39.0–52.0)
Hemoglobin: 10.9 g/dL — ABNORMAL LOW (ref 13.0–17.0)
MCH: 27.9 pg (ref 26.0–34.0)
MCHC: 30.1 g/dL (ref 30.0–36.0)
MCV: 92.6 fL (ref 80.0–100.0)
Platelets: 243 10*3/uL (ref 150–400)
RBC: 3.91 MIL/uL — ABNORMAL LOW (ref 4.22–5.81)
RDW: 16.7 % — ABNORMAL HIGH (ref 11.5–15.5)
WBC: 7.5 10*3/uL (ref 4.0–10.5)
nRBC: 0 % (ref 0.0–0.2)

## 2021-04-11 LAB — HEMOGLOBIN A1C
Hgb A1c MFr Bld: 4.9 % (ref 4.8–5.6)
Mean Plasma Glucose: 93.93 mg/dL

## 2021-04-11 LAB — BASIC METABOLIC PANEL
Anion gap: 8 (ref 5–15)
BUN: 22 mg/dL (ref 8–23)
CO2: 27 mmol/L (ref 22–32)
Calcium: 9.4 mg/dL (ref 8.9–10.3)
Chloride: 104 mmol/L (ref 98–111)
Creatinine, Ser: 0.92 mg/dL (ref 0.61–1.24)
GFR, Estimated: >60 mL/min (ref >60)
Glucose, Bld: 99 mg/dL (ref 70–99)
Potassium: 4.4 mmol/L (ref 3.5–5.1)
Sodium: 139 mmol/L (ref 135–145)

## 2021-04-11 NOTE — Progress Notes (Signed)
COVID Vaccine Completed: Yes Date COVID Vaccine completed: 2022 x 3 COVID vaccine manufacturer:     [place]  Covid test: N/A. Positive Covod on: 01/23/21. : EPIC PCP - Dr. [name] Cardiologist - Dr. Rozann Lesches. LOV: 12/09/20. Dr. Debara Pickett: LOV: 04/07/21  Chest x-ray - 01/23/21 EKG - 01/24/21 Stress Test -  ECHO - 10/12/20 Cardiac Cath - 10/19/20 Pacemaker/ICD device last checked:  Sleep Study - Yes CPAP - Yes  Fasting Blood Sugar -  Checks Blood Sugar _____ times a day  Blood Thinner Instructions: Pt. Was instructed by RN. To call cardiologist and get instructions for Eliquis and Plavix. Aspirin Instructions: Last Dose:  Anesthesia review: Hx: HTN,CAD(stents 2022),MI,PE,OSA(CPAP).  Patient denies shortness of breath, fever, cough and chest pain at PAT appointment   Patient verbalized understanding of instructions that were given to them at the PAT appointment. Patient was also instructed that they will need to review over the PAT instructions again at home before surgery.

## 2021-04-14 ENCOUNTER — Telehealth: Payer: Self-pay | Admitting: Cardiology

## 2021-04-14 ENCOUNTER — Encounter: Payer: Self-pay | Admitting: Cardiology

## 2021-04-14 NOTE — Telephone Encounter (Signed)
   [facility] Pre-operative Risk Assessment    Patient Name: Richard Webb  DOB: 10-Mar-1946 MRN: [id number]  HEARTCARE STAFF:  - IMPORTANT!!!!!! Under Visit Info/Reason for Call, type in Other and utilize the format Clearance MM/DD/YY or Clearance TBD. Do not use dashes or single digits. - Please review there is not already an duplicate clearance open for this procedure. - If request is for dental extraction, please clarify the # of teeth to be extracted. - If the patient is currently at the dentist's office, call Pre-Op Callback Staff (MA/nurse) to input urgent request.  - If the patient is not currently in the dentist office, please route to the Pre-Op pool.  Request for surgical clearance:  What type of surgery is being performed? Colectomy surgery   When is this surgery scheduled? In near future  What type of clearance is required (medical clearance vs. Pharmacy clearance to hold med vs. Both)? both  Are there any medications that need to be held prior to surgery and how long? Eliquis   Practice name and name of physician performing surgery? [facility]  What is the office phone number? [phone]   7.   What is the office fax number? [phone]  8.   Anesthesia type (None, local, MAC, general) ? general   Jannet Askew 04/14/2021, 9:49 AM  _________________________________________________________________   (provider comments below)

## 2021-04-14 NOTE — Progress Notes (Addendum)
Anesthesia Chart Review   Case: [id number] Date/Time: 04/22/21 0715   Procedures:      XI ROBOT ASSISTED LAPAROSCOPIC PARTIAL COLECTOMY WITH LIVER BIOPSY     CYSTOSCOPY WITHE FIREFLY INJECTIONS   Anesthesia type: General   Pre-op diagnosis: DIVERTICULUITIS   Location: [facility] ROOM 02 / [facility]   Surgeons: Leighton Ruff, MD; Irine Seal, MD       DISCUSSION:75 y.o. never smoker with h/o GERD, HTN, CAD (DES 10/19/20), OSA, diverticulitis scheduled for above procedure 04/29/82 with Dr. Eulah Citizen and Dr. Irine Seal.   He was hospitalized in February with NSTEMI, underwent DES to the proximal LAD during hospital stay, also found to have small pulmonary embolus by chest CTA. Per notes pt required to remain on plavix and Eliquis for at least 6 months, recommended 1 year.   Pt advised by cardio to hold Eliquis 48 days prior to surgery. Last dose of Plavix per cardio notes 04/18/2021.   Pt last seen by cardiology 04/08/2021. Stable at this visit with 1 year follow up recommended.    VS: BP 103/61   Pulse (!) 57   Temp 36.7 C (Oral)   Ht 6' (1.829 m)   Wt 88.9 kg   SpO2 97%   BMI 26.58 kg/m   PROVIDERS: [name], MD is PCP   Rozann Lesches, MD is cardiologist  LABS: Labs reviewed: Acceptable for surgery. (all labs ordered are listed, but only abnormal results are displayed)  Labs Reviewed  CBC - Abnormal; Notable for the following components:      Result Value   RBC 3.91 (*)    Hemoglobin 10.9 (*)    HCT 36.2 (*)    RDW 16.7 (*)    All other components within normal limits  HEMOGLOBIN J8S  BASIC METABOLIC PANEL     IMAGES:   EKG: 01/24/2021 Rate 55 bpm  Sinus rhythm  Prolonged PR interval  Nonspecific intraventricular conduction delay  CV: Echo 10/17/2020  1. Left ventricular ejection fraction, by estimation, is 70 to 75%. The  left ventricle has hyperdynamic function. The left ventricle has no  regional wall motion abnormalities. There is mild left ventricular   hypertrophy of the basal-septal segment. Left  ventricular diastolic parameters are consistent with Grade I diastolic  dysfunction (impaired relaxation).   2. Right ventricular systolic function is normal. The right ventricular  size is normal.   3. The mitral valve is normal in structure. Trivial mitral valve  regurgitation. No evidence of mitral stenosis.   4. The aortic valve is tricuspid. Aortic valve regurgitation is not  visualized. Mild aortic valve sclerosis is present, with no evidence of  aortic valve stenosis.  Past Medical History:  Diagnosis Date   Anginal pain ([place])    Arthritis    Asthma    Coronary atherosclerosis of native coronary artery    a. DES x 3 RCA and DES mCx 10/11 - [place] - with residual 50-70% stenosis in the distal Cx in AV groove, 70% distal LAD beyond apex, 70% small D1. b. 12/2017: similar results with multivessel CAD. Patent stents. Medical management recommended.  c. s/p NSTEMI in 10/2020 with DES to prox-LAD   Diverticulitis    GERD (gastroesophageal reflux disease)    History of kidney stones    Hypertension    Mixed hyperlipidemia    Myocardial infarction ([place])    Obstructive sleep apnea    Pulmonary hypertension ([facility])    PASP 50 mmHg   Restless leg syndrome  Sarcoidosis    Statin intolerance    Ulcerative colitis ([place])     Past Surgical History:  Procedure Laterality Date   Ankle fracture repair Left 1985   APPENDECTOMY     CARDIAC CATHETERIZATION     stents x4   CHOLECYSTECTOMY  1970   CORONARY STENT INTERVENTION N/A 10/19/2020   Procedure: CORONARY STENT INTERVENTION;  Surgeon: Troy Sine, MD;  Location: [facility];  Service: Cardiovascular;  Laterality: N/A;   CYSTOSCOPY WITH INSERTION OF UROLIFT N/A 08/27/2017   Procedure: CYSTOSCOPY WITH INSERTION OF UROLIFT;  Surgeon: Cleon Gustin, MD;  Location: [facility];  Service: Urology;  Laterality: N/A;   JOINT REPLACEMENT     LAPAROSCOPIC APPENDECTOMY  2013   LEFT  HEART CATH AND CORONARY ANGIOGRAPHY N/A 01/02/2018   Procedure: LEFT HEART CATH AND CORONARY ANGIOGRAPHY;  Surgeon: Troy Sine, MD;  Location: [facility];  Service: Cardiovascular;  Laterality: N/A;   LEFT HEART CATH AND CORONARY ANGIOGRAPHY N/A 10/19/2020   Procedure: LEFT HEART CATH AND CORONARY ANGIOGRAPHY;  Surgeon: Troy Sine, MD;  Location: [facility];  Service: Cardiovascular;  Laterality: N/A;   Right total knee replacement Bilateral 2005    MEDICATIONS:  amoxicillin (AMOXIL) 500 MG capsule   acetaminophen (TYLENOL) 500 MG tablet   apixaban (ELIQUIS) 5 MG TABS tablet   BREO ELLIPTA 200-25 MCG/INH AEPB   carbidopa-levodopa (SINEMET IR) 10-100 MG per tablet   clopidogrel (PLAVIX) 75 MG tablet   cyanocobalamin (,VITAMIN B-12,) 1000 MCG/ML injection   Dupilumab 300 MG/2ML SOPN   famotidine (PEPCID) 20 MG tablet   finasteride (PROSCAR) 5 MG tablet   folic acid (FOLVITE) 1 MG tablet   HYDROcodone-acetaminophen (NORCO/VICODIN) 5-325 MG tablet   isosorbide mononitrate (IMDUR) 30 MG 24 hr tablet   losartan (COZAAR) 50 MG tablet   metoprolol tartrate (LOPRESSOR) 25 MG tablet   montelukast (SINGULAIR) 10 MG tablet   nitroGLYCERIN (NITROSTAT) 0.4 MG SL tablet   omeprazole (PRILOSEC) 40 MG capsule   oxymetazoline (AFRIN) 0.05 % nasal spray   phenazopyridine (PYRIDIUM) 100 MG tablet   REPATHA SURECLICK 295 MG/ML SOAJ   sulfaSALAzine (AZULFIDINE) 500 MG tablet   temazepam (RESTORIL) 30 MG capsule   Vitamin D, Ergocalciferol, (DRISDOL) 50000 UNITS CAPS capsule   No current facility-administered medications for this encounter.    Konrad Felix Ward, PA-C [facility]Surgical Testing [phone]

## 2021-04-15 NOTE — Telephone Encounter (Signed)
Patient with diagnosis of PE on Eliquis for anticoagulation.    Procedure: Colectomy surgery  Date of procedure: TBD  PE found 10/18/20. Patient has just about completed 6 months of therapy. This appears to be his first PE.  CrCl 76 ml/min  Patient is at the end of his 6 months mark of Eliquis for PE. I will confirm with Dr. Domenic Polite that he is ok with patient holding Eliquis for 2 days prior to surgery.

## 2021-04-18 ENCOUNTER — Telehealth: Payer: Self-pay

## 2021-04-18 NOTE — Telephone Encounter (Addendum)
   I previously reviewed with Dr. Domenic Polite at his prior office visit that the patient could hold Plavix and Eliquis temporarily for surgery after 04/21/2021. It appears this was sent to Dr. Domenic Polite by the Pharmacist earlier today as well in regards to holding Eliquis 2 days prior to surgery. (See his response in Epic). He is also on Plavix but the clearance form did not address holding this. I would recommend he reach out to the surgeon's office to see if they want him to hold Plavix if they have not yet told him (typically held for 5 days).   Thanks,  [place]   Addendum: In looking at the patient's chart, he is scheduled for surgery this coming Friday. I called the patient and he is aware to hold Eliquis for 2 days prior to surgery. He was not told about Plavix but if I remember from communication with his surgeon months ago, he needs to hold both Plavix and Eliquis which led to the delay in his procedure given his recent PE and coronary stent in 10/2020. I advised him to hold Plavix for tomorrow morning and I will send a staff message to his surgeon to verify he needs to hold this as well.   Signed, Erma Heritage, PA-C 04/18/2021, 6:33 PM

## 2021-04-18 NOTE — Telephone Encounter (Signed)
Patient would like a call back for someone to let patient know if he is okay to hold medications before surgery this coming Friday 9/2.  "the clearance is only for Eliquis. but we havent cleared him to hold yet" -Marcelle Overlie [facility], CPP   Pt notified of this and was upset. Pt stated that he had already seen surgeon and was told that everything was good to go for his surgery he just needed to know what medications to hold. Pt stated that he is frustrated to the point he will stop medications on his own. Advised pt that was not a good idea and stated I would send [place], PA-C a message to clarify what is happening.   Please Advise.

## 2021-04-19 ENCOUNTER — Ambulatory Visit (HOSPITAL_COMMUNITY)
Admission: RE | Admit: 2021-04-19 | Discharge: 2021-04-19 | Disposition: A | Discharge status: Home / Self Care | Payer: Medicare Other | Source: Ambulatory Visit | Attending: Internal Medicine | Admitting: Internal Medicine

## 2021-04-19 ENCOUNTER — Encounter (HOSPITAL_COMMUNITY): Payer: Self-pay

## 2021-04-19 ENCOUNTER — Other Ambulatory Visit: Payer: Self-pay

## 2021-04-19 DIAGNOSIS — N62 Hypertrophy of breast: Secondary | ICD-10-CM

## 2021-04-19 DIAGNOSIS — N6310 Unspecified lump in the right breast, unspecified quadrant: Secondary | ICD-10-CM

## 2021-04-19 DIAGNOSIS — N644 Mastodynia: Secondary | ICD-10-CM | POA: Diagnosis not present

## 2021-04-19 NOTE — Telephone Encounter (Signed)
    Patient Name: Richard Webb  DOB: 04/12/1946 MRN: [id number]  Primary Cardiologist: Rozann Lesches, MD  Chart reviewed as part of pre-operative protocol coverage. Spoke with Bernerd Pho, PA-C, today who is very familiar with this patient and has been helping address pre-op concerns. Per [place], patient is OK to proceed with surgery.   I will copy Brittany's recommendations for Eliquis and Plavix per phone note on 04/18/2021 below: "In looking at the patient's chart, he is scheduled for surgery this coming Friday. I called the patient and he is aware to hold Eliquis for 2 days prior to surgery. He was not told about Plavix but if I remember from communication with his surgeon months ago, he needs to hold both Plavix and Eliquis which led to the delay in his procedure given his recent PE and coronary stent in 10/2020. I advised him to hold Plavix for tomorrow morning and I will send a staff message to his surgeon to verify he needs to hold this as well."  I will route this recommendation to the requesting party via [place] fax function and remove from pre-op pool.  Please call with questions.  Darreld Mclean, PA-C 04/19/2021, 5:30 PM

## 2021-04-20 ENCOUNTER — Ambulatory Visit
Admission: RE | Admit: 2021-04-20 | Discharge: 2021-04-20 | Disposition: A | Discharge status: Home / Self Care | Payer: Medicare Other | Source: Ambulatory Visit | Attending: General Surgery | Admitting: General Surgery

## 2021-04-20 DIAGNOSIS — N3289 Other specified disorders of bladder: Secondary | ICD-10-CM | POA: Diagnosis not present

## 2021-04-20 DIAGNOSIS — K6389 Other specified diseases of intestine: Secondary | ICD-10-CM | POA: Diagnosis not present

## 2021-04-20 DIAGNOSIS — E7800 Pure hypercholesterolemia, unspecified: Secondary | ICD-10-CM | POA: Diagnosis not present

## 2021-04-20 DIAGNOSIS — R933 Abnormal findings on diagnostic imaging of other parts of digestive tract: Secondary | ICD-10-CM

## 2021-04-20 DIAGNOSIS — K4090 Unilateral inguinal hernia, without obstruction or gangrene, not specified as recurrent: Secondary | ICD-10-CM | POA: Diagnosis not present

## 2021-04-20 DIAGNOSIS — K660 Peritoneal adhesions (postprocedural) (postinfection): Secondary | ICD-10-CM | POA: Diagnosis not present

## 2021-04-20 DIAGNOSIS — I10 Essential (primary) hypertension: Secondary | ICD-10-CM | POA: Diagnosis not present

## 2021-04-20 DIAGNOSIS — N321 Vesicointestinal fistula: Secondary | ICD-10-CM

## 2021-04-20 MED ORDER — IOPAMIDOL (ISOVUE-370) INJECTION 76%
80.0000 mL | Freq: Once | INTRAVENOUS | Status: AC | PRN
  Administered 2021-04-20: 80 mL via INTRAVENOUS

## 2021-04-21 NOTE — Anesthesia Preprocedure Evaluation (Addendum)
Anesthesia Evaluation  Patient identified by MRN, date of birth, ID band Patient awake    Reviewed: Allergy & Precautions, NPO status , Patient's Chart, lab work & pertinent test results  Airway Mallampati: III  TM Distance: >3 FB Neck ROM: Full    Dental no notable dental hx.    Pulmonary asthma , sleep apnea , PE   Pulmonary exam normal breath sounds clear to auscultation       Cardiovascular hypertension, Pt. on home beta blockers and Pt. on medications pulmonary hypertension+ CAD, + Past MI and + Cardiac Stents  Normal cardiovascular exam Rhythm:Regular Rate:Normal     Neuro/Psych negative psych ROS   GI/Hepatic Neg liver ROS, PUD, GERD  Medicated and Controlled,  Endo/Other  negative endocrine ROS  Renal/GU negative Renal ROS     Musculoskeletal  (+) Arthritis ,   Abdominal   Peds  Hematology  (+) anemia , HLD   Anesthesia Other Findings DIVERTICULIITIS  Reproductive/Obstetrics                           Anesthesia Physical Anesthesia Plan  ASA: 3  Anesthesia Plan: General   Post-op Pain Management:    Induction: Intravenous  PONV Risk Score and Plan: 2 and Ondansetron, Dexamethasone and Treatment may vary due to age or medical condition  Airway Management Planned: Oral ETT  Additional Equipment:   Intra-op Plan:   Post-operative Plan: Extubation in OR  Informed Consent: I have reviewed the patients History and Physical, chart, labs and discussed the procedure including the risks, benefits and alternatives for the proposed anesthesia with the patient or authorized representative who has indicated his/her understanding and acceptance.     Dental advisory given  Plan Discussed with: CRNA  Anesthesia Plan Comments: (Reviewed PAT note 04/11/2021, Konrad Felix Ward, PA-C Potential arterial line discussed)      Anesthesia Quick Evaluation

## 2021-04-22 ENCOUNTER — Encounter (HOSPITAL_COMMUNITY): Payer: Self-pay | Admitting: General Surgery

## 2021-04-22 ENCOUNTER — Inpatient Hospital Stay (HOSPITAL_COMMUNITY)
Admission: RE | Admit: 2021-04-22 | Discharge: 2021-04-25 | DRG: 330 | Disposition: A | Discharge status: Home / Self Care | Payer: Medicare Other | Source: Ambulatory Visit | Attending: General Surgery | Admitting: General Surgery

## 2021-04-22 ENCOUNTER — Inpatient Hospital Stay (HOSPITAL_COMMUNITY): Payer: Medicare Other | Admitting: Anesthesiology

## 2021-04-22 ENCOUNTER — Encounter (HOSPITAL_COMMUNITY)
Admission: RE | Disposition: A | Discharge status: Home / Self Care | Payer: Self-pay | Source: Ambulatory Visit | Attending: General Surgery

## 2021-04-22 ENCOUNTER — Inpatient Hospital Stay (HOSPITAL_COMMUNITY): Payer: Medicare Other | Admitting: Physician Assistant

## 2021-04-22 ENCOUNTER — Other Ambulatory Visit: Payer: Self-pay

## 2021-04-22 DIAGNOSIS — Z7951 Long term (current) use of inhaled steroids: Secondary | ICD-10-CM | POA: Diagnosis not present

## 2021-04-22 DIAGNOSIS — D869 Sarcoidosis, unspecified: Secondary | ICD-10-CM | POA: Diagnosis present

## 2021-04-22 DIAGNOSIS — K5730 Diverticulosis of large intestine without perforation or abscess without bleeding: Secondary | ICD-10-CM | POA: Diagnosis not present

## 2021-04-22 DIAGNOSIS — K5732 Diverticulitis of large intestine without perforation or abscess without bleeding: Principal | ICD-10-CM | POA: Diagnosis present

## 2021-04-22 DIAGNOSIS — Z86711 Personal history of pulmonary embolism: Secondary | ICD-10-CM

## 2021-04-22 DIAGNOSIS — D649 Anemia, unspecified: Secondary | ICD-10-CM | POA: Diagnosis present

## 2021-04-22 DIAGNOSIS — Z7901 Long term (current) use of anticoagulants: Secondary | ICD-10-CM | POA: Diagnosis not present

## 2021-04-22 DIAGNOSIS — Z955 Presence of coronary angioplasty implant and graft: Secondary | ICD-10-CM | POA: Diagnosis not present

## 2021-04-22 DIAGNOSIS — K5720 Diverticulitis of large intestine with perforation and abscess without bleeding: Secondary | ICD-10-CM | POA: Diagnosis not present

## 2021-04-22 DIAGNOSIS — Z79899 Other long term (current) drug therapy: Secondary | ICD-10-CM | POA: Diagnosis not present

## 2021-04-22 DIAGNOSIS — N321 Vesicointestinal fistula: Secondary | ICD-10-CM | POA: Diagnosis present

## 2021-04-22 DIAGNOSIS — I272 Pulmonary hypertension, unspecified: Secondary | ICD-10-CM | POA: Diagnosis present

## 2021-04-22 DIAGNOSIS — K7581 Nonalcoholic steatohepatitis (NASH): Secondary | ICD-10-CM | POA: Diagnosis not present

## 2021-04-22 DIAGNOSIS — N400 Enlarged prostate without lower urinary tract symptoms: Secondary | ICD-10-CM | POA: Diagnosis present

## 2021-04-22 DIAGNOSIS — G4733 Obstructive sleep apnea (adult) (pediatric): Secondary | ICD-10-CM | POA: Diagnosis present

## 2021-04-22 DIAGNOSIS — R059 Cough, unspecified: Secondary | ICD-10-CM | POA: Diagnosis not present

## 2021-04-22 DIAGNOSIS — E782 Mixed hyperlipidemia: Secondary | ICD-10-CM | POA: Diagnosis present

## 2021-04-22 DIAGNOSIS — I252 Old myocardial infarction: Secondary | ICD-10-CM | POA: Diagnosis not present

## 2021-04-22 DIAGNOSIS — Z91030 Bee allergy status: Secondary | ICD-10-CM

## 2021-04-22 DIAGNOSIS — Z8249 Family history of ischemic heart disease and other diseases of the circulatory system: Secondary | ICD-10-CM

## 2021-04-22 DIAGNOSIS — I10 Essential (primary) hypertension: Secondary | ICD-10-CM | POA: Diagnosis present

## 2021-04-22 DIAGNOSIS — Z91040 Latex allergy status: Secondary | ICD-10-CM

## 2021-04-22 DIAGNOSIS — K219 Gastro-esophageal reflux disease without esophagitis: Secondary | ICD-10-CM | POA: Diagnosis present

## 2021-04-22 DIAGNOSIS — R0602 Shortness of breath: Secondary | ICD-10-CM | POA: Diagnosis not present

## 2021-04-22 DIAGNOSIS — Z7982 Long term (current) use of aspirin: Secondary | ICD-10-CM | POA: Diagnosis not present

## 2021-04-22 DIAGNOSIS — Z791 Long term (current) use of non-steroidal anti-inflammatories (NSAID): Secondary | ICD-10-CM | POA: Diagnosis not present

## 2021-04-22 DIAGNOSIS — N3289 Other specified disorders of bladder: Secondary | ICD-10-CM | POA: Diagnosis present

## 2021-04-22 DIAGNOSIS — M1990 Unspecified osteoarthritis, unspecified site: Secondary | ICD-10-CM | POA: Diagnosis present

## 2021-04-22 DIAGNOSIS — R509 Fever, unspecified: Secondary | ICD-10-CM | POA: Diagnosis not present

## 2021-04-22 DIAGNOSIS — J449 Chronic obstructive pulmonary disease, unspecified: Secondary | ICD-10-CM | POA: Diagnosis present

## 2021-04-22 DIAGNOSIS — I2510 Atherosclerotic heart disease of native coronary artery without angina pectoris: Secondary | ICD-10-CM | POA: Diagnosis present

## 2021-04-22 DIAGNOSIS — Z87442 Personal history of urinary calculi: Secondary | ICD-10-CM | POA: Diagnosis not present

## 2021-04-22 DIAGNOSIS — Z888 Allergy status to other drugs, medicaments and biological substances status: Secondary | ICD-10-CM

## 2021-04-22 DIAGNOSIS — Z87892 Personal history of anaphylaxis: Secondary | ICD-10-CM

## 2021-04-22 DIAGNOSIS — Z8719 Personal history of other diseases of the digestive system: Secondary | ICD-10-CM

## 2021-04-22 DIAGNOSIS — K760 Fatty (change of) liver, not elsewhere classified: Secondary | ICD-10-CM | POA: Diagnosis present

## 2021-04-22 DIAGNOSIS — G2581 Restless legs syndrome: Secondary | ICD-10-CM | POA: Diagnosis present

## 2021-04-22 HISTORY — PX: CYSTOSCOPY: SHX5120

## 2021-04-22 SURGERY — COLECTOMY, PARTIAL, ROBOT-ASSISTED, LAPAROSCOPIC
Anesthesia: General

## 2021-04-22 MED ORDER — SIMETHICONE 80 MG PO CHEW
40.0000 mg | CHEWABLE_TABLET | Freq: Four times a day (QID) | ORAL | Status: DC | PRN
  Filled 2021-04-22: qty 1

## 2021-04-22 MED ORDER — ROCURONIUM BROMIDE 10 MG/ML (PF) SYRINGE
PREFILLED_SYRINGE | INTRAVENOUS | Status: DC | PRN
  Administered 2021-04-22: 10 mg via INTRAVENOUS
  Administered 2021-04-22 (×2): 20 mg via INTRAVENOUS
  Administered 2021-04-22: 60 mg via INTRAVENOUS

## 2021-04-22 MED ORDER — STERILE WATER FOR INJECTION IJ SOLN
INTRAMUSCULAR | Status: AC
  Filled 2021-04-22: qty 10

## 2021-04-22 MED ORDER — BUPIVACAINE LIPOSOME 1.3 % IJ SUSP
20.0000 mL | Freq: Once | INTRAMUSCULAR | Status: DC
  Filled 2021-04-22: qty 20

## 2021-04-22 MED ORDER — RINGERS IRRIGATION IR SOLN
Status: DC | PRN
  Administered 2021-04-22: 1

## 2021-04-22 MED ORDER — PROPOFOL 10 MG/ML IV BOLUS
INTRAVENOUS | Status: DC | PRN
  Administered 2021-04-22: 150 mg via INTRAVENOUS

## 2021-04-22 MED ORDER — FINASTERIDE 5 MG PO TABS
5.0000 mg | ORAL_TABLET | Freq: Every day | ORAL | Status: DC
  Administered 2021-04-23 – 2021-04-25 (×3): 5 mg via ORAL
  Filled 2021-04-22 (×3): qty 1

## 2021-04-22 MED ORDER — DIPHENHYDRAMINE HCL 50 MG/ML IJ SOLN: 12.5000 mg | Freq: Four times a day (QID) | INTRAMUSCULAR | Status: DC | PRN

## 2021-04-22 MED ORDER — CARBIDOPA-LEVODOPA 10-100 MG PO TABS
1.0000 | ORAL_TABLET | Freq: Every day | ORAL | Status: DC
  Administered 2021-04-22 – 2021-04-24 (×3): 1 via ORAL
  Filled 2021-04-22 (×3): qty 1

## 2021-04-22 MED ORDER — LIP MEDEX EX OINT
TOPICAL_OINTMENT | CUTANEOUS | Status: AC
  Filled 2021-04-22: qty 7

## 2021-04-22 MED ORDER — ONDANSETRON HCL 4 MG/2ML IJ SOLN: 4.0000 mg | Freq: Four times a day (QID) | INTRAMUSCULAR | Status: DC | PRN

## 2021-04-22 MED ORDER — LIDOCAINE 2% (20 MG/ML) 5 ML SYRINGE
INTRAMUSCULAR | Status: DC | PRN
  Administered 2021-04-22: 80 mg via INTRAVENOUS

## 2021-04-22 MED ORDER — FENTANYL CITRATE (PF) 100 MCG/2ML IJ SOLN
INTRAMUSCULAR | Status: AC
  Filled 2021-04-22: qty 2

## 2021-04-22 MED ORDER — FLUTICASONE FUROATE-VILANTEROL 200-25 MCG/INH IN AEPB: 1.0000 | INHALATION_SPRAY | Freq: Every day | RESPIRATORY_TRACT | Status: DC

## 2021-04-22 MED ORDER — SODIUM CHLORIDE 0.9 % IV SOLN
2.0000 g | INTRAVENOUS | Status: AC
  Administered 2021-04-22: 2 g via INTRAVENOUS
  Filled 2021-04-22: qty 2

## 2021-04-22 MED ORDER — MONTELUKAST SODIUM 10 MG PO TABS
10.0000 mg | ORAL_TABLET | Freq: Every day | ORAL | Status: DC
  Administered 2021-04-22 – 2021-04-24 (×3): 10 mg via ORAL
  Filled 2021-04-22 (×3): qty 1

## 2021-04-22 MED ORDER — BUPIVACAINE-EPINEPHRINE 0.25% -1:200000 IJ SOLN
INTRAMUSCULAR | Status: DC | PRN
  Administered 2021-04-22: 30 mL

## 2021-04-22 MED ORDER — ALVIMOPAN 12 MG PO CAPS
12.0000 mg | ORAL_CAPSULE | Freq: Two times a day (BID) | ORAL | Status: DC
  Administered 2021-04-23: 12 mg via ORAL
  Filled 2021-04-22 (×2): qty 1

## 2021-04-22 MED ORDER — ATROPINE SULFATE 1 MG/10ML IJ SOSY
PREFILLED_SYRINGE | INTRAMUSCULAR | Status: AC
  Filled 2021-04-22: qty 10

## 2021-04-22 MED ORDER — ROCURONIUM BROMIDE 10 MG/ML (PF) SYRINGE
PREFILLED_SYRINGE | INTRAVENOUS | Status: AC
  Filled 2021-04-22: qty 10

## 2021-04-22 MED ORDER — ORAL CARE MOUTH RINSE: 15.0000 mL | Freq: Once | OROMUCOSAL | Status: AC

## 2021-04-22 MED ORDER — ACETAMINOPHEN 500 MG PO TABS
ORAL_TABLET | ORAL | Status: AC
  Filled 2021-04-22: qty 2

## 2021-04-22 MED ORDER — SODIUM CHLORIDE (PF) 0.9 % IJ SOLN
INTRAMUSCULAR | Status: AC
  Filled 2021-04-22: qty 10

## 2021-04-22 MED ORDER — EPHEDRINE 5 MG/ML INJ
INTRAVENOUS | Status: AC
  Filled 2021-04-22: qty 5

## 2021-04-22 MED ORDER — BUPIVACAINE LIPOSOME 1.3 % IJ SUSP
INTRAMUSCULAR | Status: AC
  Filled 2021-04-22: qty 10

## 2021-04-22 MED ORDER — ENOXAPARIN SODIUM 40 MG/0.4ML IJ SOSY
40.0000 mg | PREFILLED_SYRINGE | INTRAMUSCULAR | Status: DC
  Administered 2021-04-23: 40 mg via SUBCUTANEOUS
  Filled 2021-04-22: qty 0.4

## 2021-04-22 MED ORDER — HYDROCODONE-ACETAMINOPHEN 5-325 MG PO TABS
1.0000 | ORAL_TABLET | Freq: Four times a day (QID) | ORAL | Status: DC | PRN
  Administered 2021-04-22 – 2021-04-23 (×2): 1 via ORAL
  Administered 2021-04-24: 2 via ORAL
  Administered 2021-04-25: 1 via ORAL
  Filled 2021-04-22: qty 1
  Filled 2021-04-22 (×2): qty 2
  Filled 2021-04-22 (×3): qty 1
  Filled 2021-04-22: qty 2

## 2021-04-22 MED ORDER — ONDANSETRON HCL 4 MG/2ML IJ SOLN
INTRAMUSCULAR | Status: AC
  Filled 2021-04-22: qty 2

## 2021-04-22 MED ORDER — HYDROMORPHONE HCL 1 MG/ML IJ SOLN
INTRAMUSCULAR | Status: AC
  Filled 2021-04-22: qty 1

## 2021-04-22 MED ORDER — FENTANYL CITRATE (PF) 250 MCG/5ML IJ SOLN
INTRAMUSCULAR | Status: DC | PRN
  Administered 2021-04-22: 50 ug via INTRAVENOUS
  Administered 2021-04-22: 100 ug via INTRAVENOUS
  Administered 2021-04-22 (×4): 50 ug via INTRAVENOUS

## 2021-04-22 MED ORDER — FLUTICASONE FUROATE-VILANTEROL 200-25 MCG/INH IN AEPB
1.0000 | INHALATION_SPRAY | Freq: Every day | RESPIRATORY_TRACT | Status: DC
  Administered 2021-04-23 – 2021-04-25 (×3): 1 via RESPIRATORY_TRACT
  Filled 2021-04-22: qty 28

## 2021-04-22 MED ORDER — INDOCYANINE GREEN 25 MG IV SOLR
INTRAVENOUS | Status: DC | PRN
  Administered 2021-04-22: 6.25 mg via INTRAVENOUS

## 2021-04-22 MED ORDER — ALVIMOPAN 12 MG PO CAPS
12.0000 mg | ORAL_CAPSULE | ORAL | Status: AC
  Administered 2021-04-22: 12 mg via ORAL
  Filled 2021-04-22: qty 1

## 2021-04-22 MED ORDER — GABAPENTIN 300 MG PO CAPS
300.0000 mg | ORAL_CAPSULE | Freq: Two times a day (BID) | ORAL | Status: DC
  Administered 2021-04-22 – 2021-04-25 (×6): 300 mg via ORAL
  Filled 2021-04-22 (×6): qty 1

## 2021-04-22 MED ORDER — ALUM & MAG HYDROXIDE-SIMETH 200-200-20 MG/5ML PO SUSP: 30.0000 mL | Freq: Four times a day (QID) | ORAL | Status: DC | PRN

## 2021-04-22 MED ORDER — KCL IN DEXTROSE-NACL 20-5-0.45 MEQ/L-%-% IV SOLN
INTRAVENOUS | Status: DC
  Filled 2021-04-22 (×2): qty 1000

## 2021-04-22 MED ORDER — FENTANYL CITRATE PF 50 MCG/ML IJ SOSY
PREFILLED_SYRINGE | INTRAMUSCULAR | Status: AC
  Filled 2021-04-22: qty 1

## 2021-04-22 MED ORDER — ENSURE PRE-SURGERY PO LIQD
592.0000 mL | Freq: Once | ORAL | Status: DC
  Filled 2021-04-22: qty 592

## 2021-04-22 MED ORDER — HYDROMORPHONE HCL 1 MG/ML IJ SOLN
0.5000 mg | INTRAMUSCULAR | Status: DC | PRN
  Administered 2021-04-22 – 2021-04-23 (×3): 0.5 mg via INTRAVENOUS
  Filled 2021-04-22 (×2): qty 0.5

## 2021-04-22 MED ORDER — ISOSORBIDE MONONITRATE ER 30 MG PO TB24
30.0000 mg | ORAL_TABLET | Freq: Every day | ORAL | Status: DC
  Administered 2021-04-23 – 2021-04-25 (×3): 30 mg via ORAL
  Filled 2021-04-22 (×3): qty 1

## 2021-04-22 MED ORDER — TEMAZEPAM 15 MG PO CAPS
30.0000 mg | ORAL_CAPSULE | Freq: Every day | ORAL | Status: DC
  Administered 2021-04-22 – 2021-04-24 (×3): 30 mg via ORAL
  Filled 2021-04-22 (×3): qty 2

## 2021-04-22 MED ORDER — METHYLENE BLUE 0.5 % INJ SOLN
INTRAVENOUS | Status: AC
  Filled 2021-04-22: qty 10

## 2021-04-22 MED ORDER — SODIUM CHLORIDE 0.9 % IV SOLN
2.0000 g | Freq: Two times a day (BID) | INTRAVENOUS | Status: AC
  Administered 2021-04-22: 2 g via INTRAVENOUS
  Filled 2021-04-22: qty 2

## 2021-04-22 MED ORDER — ACETAMINOPHEN 500 MG PO TABS
1000.0000 mg | ORAL_TABLET | ORAL | Status: AC
  Administered 2021-04-22: 1000 mg via ORAL
  Filled 2021-04-22: qty 2

## 2021-04-22 MED ORDER — AMISULPRIDE (ANTIEMETIC) 5 MG/2ML IV SOLN: 10.0000 mg | Freq: Once | INTRAVENOUS | Status: DC | PRN

## 2021-04-22 MED ORDER — FENTANYL CITRATE (PF) 250 MCG/5ML IJ SOLN
INTRAMUSCULAR | Status: AC
  Filled 2021-04-22: qty 5

## 2021-04-22 MED ORDER — DEXAMETHASONE SODIUM PHOSPHATE 10 MG/ML IJ SOLN
INTRAMUSCULAR | Status: AC
  Filled 2021-04-22: qty 1

## 2021-04-22 MED ORDER — LACTATED RINGERS IV SOLN: INTRAVENOUS | Status: DC

## 2021-04-22 MED ORDER — GABAPENTIN 300 MG PO CAPS
300.0000 mg | ORAL_CAPSULE | ORAL | Status: AC
  Administered 2021-04-22: 300 mg via ORAL
  Filled 2021-04-22: qty 1

## 2021-04-22 MED ORDER — PANTOPRAZOLE SODIUM 40 MG PO TBEC
80.0000 mg | DELAYED_RELEASE_TABLET | Freq: Every day | ORAL | Status: DC
Start: 2021-04-23 — End: 2021-04-25
  Administered 2021-04-23 – 2021-04-25 (×3): 80 mg via ORAL
  Filled 2021-04-22 (×3): qty 2

## 2021-04-22 MED ORDER — LOSARTAN POTASSIUM 50 MG PO TABS
50.0000 mg | ORAL_TABLET | Freq: Every day | ORAL | Status: DC
  Administered 2021-04-23 – 2021-04-25 (×3): 50 mg via ORAL
  Filled 2021-04-22 (×3): qty 1

## 2021-04-22 MED ORDER — GLYCOPYRROLATE 0.2 MG/ML IJ SOLN
INTRAMUSCULAR | Status: AC
  Filled 2021-04-22: qty 1

## 2021-04-22 MED ORDER — DEXAMETHASONE SODIUM PHOSPHATE 10 MG/ML IJ SOLN
INTRAMUSCULAR | Status: DC | PRN
  Administered 2021-04-22: 5 mg via INTRAVENOUS

## 2021-04-22 MED ORDER — KETAMINE HCL 10 MG/ML IJ SOLN
INTRAMUSCULAR | Status: AC
  Filled 2021-04-22: qty 1

## 2021-04-22 MED ORDER — ONDANSETRON HCL 4 MG PO TABS: 4.0000 mg | ORAL_TABLET | Freq: Four times a day (QID) | ORAL | Status: DC | PRN

## 2021-04-22 MED ORDER — METOPROLOL TARTRATE 25 MG PO TABS
25.0000 mg | ORAL_TABLET | Freq: Two times a day (BID) | ORAL | Status: DC
  Administered 2021-04-22 – 2021-04-25 (×5): 25 mg via ORAL
  Filled 2021-04-22 (×6): qty 1

## 2021-04-22 MED ORDER — KETAMINE HCL 10 MG/ML IJ SOLN
INTRAMUSCULAR | Status: DC | PRN
  Administered 2021-04-22: 20 mg via INTRAVENOUS

## 2021-04-22 MED ORDER — BUPIVACAINE LIPOSOME 1.3 % IJ SUSP
INTRAMUSCULAR | Status: DC | PRN
  Administered 2021-04-22: 20 mL

## 2021-04-22 MED ORDER — CHLORHEXIDINE GLUCONATE 0.12 % MT SOLN
15.0000 mL | Freq: Once | OROMUCOSAL | Status: AC
  Administered 2021-04-22: 15 mL via OROMUCOSAL

## 2021-04-22 MED ORDER — ENSURE SURGERY PO LIQD
237.0000 mL | Freq: Two times a day (BID) | ORAL | Status: DC
  Administered 2021-04-23 – 2021-04-24 (×2): 237 mL via ORAL

## 2021-04-22 MED ORDER — ENSURE PRE-SURGERY PO LIQD
296.0000 mL | Freq: Once | ORAL | Status: DC
  Filled 2021-04-22: qty 296

## 2021-04-22 MED ORDER — FENTANYL CITRATE PF 50 MCG/ML IJ SOSY
25.0000 ug | PREFILLED_SYRINGE | INTRAMUSCULAR | Status: DC | PRN
  Administered 2021-04-22: 50 ug via INTRAVENOUS
  Administered 2021-04-22: 25 ug via INTRAVENOUS
  Administered 2021-04-22: 50 ug via INTRAVENOUS
  Administered 2021-04-22: 25 ug via INTRAVENOUS

## 2021-04-22 MED ORDER — SUGAMMADEX SODIUM 200 MG/2ML IV SOLN
INTRAVENOUS | Status: DC | PRN
  Administered 2021-04-22: 200 mg via INTRAVENOUS

## 2021-04-22 MED ORDER — FENTANYL CITRATE PF 50 MCG/ML IJ SOSY
PREFILLED_SYRINGE | INTRAMUSCULAR | Status: AC
  Filled 2021-04-22: qty 2

## 2021-04-22 MED ORDER — FAMOTIDINE 20 MG PO TABS
20.0000 mg | ORAL_TABLET | Freq: Every evening | ORAL | Status: DC
  Administered 2021-04-22 – 2021-04-24 (×3): 20 mg via ORAL
  Filled 2021-04-22 (×3): qty 1

## 2021-04-22 MED ORDER — DIPHENHYDRAMINE HCL 12.5 MG/5ML PO ELIX: 12.5000 mg | ORAL_SOLUTION | Freq: Four times a day (QID) | ORAL | Status: DC | PRN

## 2021-04-22 MED ORDER — ONDANSETRON HCL 4 MG/2ML IJ SOLN
INTRAMUSCULAR | Status: DC | PRN
  Administered 2021-04-22: 4 mg via INTRAVENOUS

## 2021-04-22 MED ORDER — 0.9 % SODIUM CHLORIDE (POUR BTL) OPTIME
TOPICAL | Status: DC | PRN
  Administered 2021-04-22: 2000 mL

## 2021-04-22 MED ORDER — SACCHAROMYCES BOULARDII 250 MG PO CAPS
250.0000 mg | ORAL_CAPSULE | Freq: Two times a day (BID) | ORAL | Status: DC
  Administered 2021-04-22 – 2021-04-25 (×6): 250 mg via ORAL
  Filled 2021-04-22 (×6): qty 1

## 2021-04-22 MED ORDER — LIDOCAINE 2% (20 MG/ML) 5 ML SYRINGE
INTRAMUSCULAR | Status: AC
  Filled 2021-04-22: qty 5

## 2021-04-22 MED ORDER — NITROGLYCERIN 0.4 MG SL SUBL: 0.4000 mg | SUBLINGUAL_TABLET | SUBLINGUAL | Status: DC | PRN

## 2021-04-22 MED ORDER — BUPIVACAINE-EPINEPHRINE (PF) 0.25% -1:200000 IJ SOLN
INTRAMUSCULAR | Status: AC
  Filled 2021-04-22: qty 30

## 2021-04-22 MED ORDER — EPHEDRINE SULFATE-NACL 50-0.9 MG/10ML-% IV SOSY
PREFILLED_SYRINGE | INTRAVENOUS | Status: DC | PRN
  Administered 2021-04-22: 5 mg via INTRAVENOUS

## 2021-04-22 MED ORDER — PROPOFOL 10 MG/ML IV BOLUS
INTRAVENOUS | Status: AC
  Filled 2021-04-22: qty 20

## 2021-04-22 MED ORDER — ACETAMINOPHEN 500 MG PO TABS
1000.0000 mg | ORAL_TABLET | Freq: Four times a day (QID) | ORAL | Status: DC
  Administered 2021-04-22 – 2021-04-23 (×4): 1000 mg via ORAL
  Filled 2021-04-22 (×3): qty 2

## 2021-04-22 MED ORDER — STERILE WATER FOR INJECTION IJ SOLN: INTRAMUSCULAR | Status: DC | PRN

## 2021-04-22 MED ORDER — ONDANSETRON HCL 4 MG/2ML IJ SOLN: 4.0000 mg | Freq: Once | INTRAMUSCULAR | Status: DC | PRN

## 2021-04-22 MED ORDER — METHYLENE BLUE 0.5 % INJ SOLN
INTRAVENOUS | Status: DC | PRN
  Administered 2021-04-22: 5 mL

## 2021-04-22 SURGICAL SUPPLY — 103 items
BAG COUNTER SPONGE SURGICOUNT (BAG) ×2 IMPLANT
BAG URO CATCHER STRL LF (MISCELLANEOUS) ×2 IMPLANT
BLADE EXTENDED COATED 6.5IN (ELECTRODE) IMPLANT
CANNULA REDUC XI 12-8 STAPL (CANNULA)
CANNULA REDUCER 12-8 DVNC XI (CANNULA) IMPLANT
CATH URET 5FR 28IN OPEN ENDED (CATHETERS) IMPLANT
CELLS DAT CNTRL 66122 CELL SVR (MISCELLANEOUS) IMPLANT
CLOTH BEACON ORANGE TIMEOUT ST (SAFETY) ×2 IMPLANT
COVER SURGICAL LIGHT HANDLE (MISCELLANEOUS) ×4 IMPLANT
COVER TIP SHEARS 8 DVNC (MISCELLANEOUS) ×1 IMPLANT
COVER TIP SHEARS 8MM DA VINCI (MISCELLANEOUS) ×1
DECANTER SPIKE VIAL GLASS SM (MISCELLANEOUS) IMPLANT
DERMABOND ADVANCED (GAUZE/BANDAGES/DRESSINGS) ×1
DERMABOND ADVANCED .7 DNX12 (GAUZE/BANDAGES/DRESSINGS) IMPLANT
DRAIN CHANNEL 19F RND (DRAIN) IMPLANT
DRAPE ARM DVNC X/XI (DISPOSABLE) ×4 IMPLANT
DRAPE COLUMN DVNC XI (DISPOSABLE) ×1 IMPLANT
DRAPE DA VINCI XI ARM (DISPOSABLE) ×4
DRAPE DA VINCI XI COLUMN (DISPOSABLE) ×1
DRAPE SURG IRRIG POUCH 19X23 (DRAPES) ×2 IMPLANT
DRSG OPSITE POSTOP 4X10 (GAUZE/BANDAGES/DRESSINGS) IMPLANT
DRSG OPSITE POSTOP 4X6 (GAUZE/BANDAGES/DRESSINGS) IMPLANT
DRSG OPSITE POSTOP 4X8 (GAUZE/BANDAGES/DRESSINGS) IMPLANT
DRSG TELFA 3X8 NADH (GAUZE/BANDAGES/DRESSINGS) ×2 IMPLANT
ELECT PENCIL ROCKER SW 15FT (MISCELLANEOUS) ×2 IMPLANT
ELECT REM PT RETURN 15FT ADLT (MISCELLANEOUS) ×2 IMPLANT
ENDOLOOP SUT PDS II  0 18 (SUTURE)
ENDOLOOP SUT PDS II 0 18 (SUTURE) IMPLANT
EVACUATOR SILICONE 100CC (DRAIN) IMPLANT
GLOVE SURG ENC MOIS LTX SZ6.5 (GLOVE) ×6 IMPLANT
GLOVE SURG POLYISO LF SZ8 (GLOVE) IMPLANT
GLOVE SURG UNDER POLY LF SZ7 (GLOVE) ×4 IMPLANT
GOWN STRL REUS W/TWL XL LVL3 (GOWN DISPOSABLE) ×8 IMPLANT
GRASPER SUT TROCAR 14GX15 (MISCELLANEOUS) IMPLANT
GUIDEWIRE STR DUAL SENSOR (WIRE) ×2 IMPLANT
HOLDER FOLEY CATH W/STRAP (MISCELLANEOUS) ×2 IMPLANT
IRRIG SUCT STRYKERFLOW 2 WTIP (MISCELLANEOUS) ×2
IRRIGATION SUCT STRKRFLW 2 WTP (MISCELLANEOUS) ×1 IMPLANT
KIT PROCEDURE DA VINCI SI (MISCELLANEOUS)
KIT PROCEDURE DVNC SI (MISCELLANEOUS) IMPLANT
KIT TURNOVER KIT A (KITS) ×4 IMPLANT
MANIFOLD NEPTUNE II (INSTRUMENTS) ×2 IMPLANT
NDL BIOPSY 14X6 SOFT TISS (NEEDLE) IMPLANT
NDL INSUFFLATION 14GA 120MM (NEEDLE) ×1 IMPLANT
NEEDLE BIOPSY 14X6 SOFT TISS (NEEDLE) ×2 IMPLANT
NEEDLE INSUFFLATION 14GA 120MM (NEEDLE) ×2 IMPLANT
PACK CARDIOVASCULAR III (CUSTOM PROCEDURE TRAY) ×2 IMPLANT
PACK COLON (CUSTOM PROCEDURE TRAY) ×2 IMPLANT
PACK CYSTO (CUSTOM PROCEDURE TRAY) ×2 IMPLANT
PAD DRESSING TELFA 3X8 NADH (GAUZE/BANDAGES/DRESSINGS) IMPLANT
PAD POSITIONING PINK XL (MISCELLANEOUS) ×2 IMPLANT
RELOAD STAPLE 60 3.5 BLU DVNC (STAPLE) IMPLANT
RELOAD STAPLE 60 4.3 GRN DVNC (STAPLE) IMPLANT
RELOAD STAPLER 3.5X60 BLU DVNC (STAPLE) IMPLANT
RELOAD STAPLER 4.3X60 GRN DVNC (STAPLE) ×1 IMPLANT
RETRACTOR WND ALEXIS 18 MED (MISCELLANEOUS) IMPLANT
RTRCTR WOUND ALEXIS 18CM MED (MISCELLANEOUS)
SCISSORS LAP 5X35 DISP (ENDOMECHANICALS) IMPLANT
SEAL CANN UNIV 5-8 DVNC XI (MISCELLANEOUS) ×3 IMPLANT
SEAL XI 5MM-8MM UNIVERSAL (MISCELLANEOUS) ×3
SEALER VESSEL DA VINCI XI (MISCELLANEOUS) ×1
SEALER VESSEL EXT DVNC XI (MISCELLANEOUS) ×1 IMPLANT
SOLUTION ELECTROLUBE (MISCELLANEOUS) ×2 IMPLANT
STAPLER 60 DA VINCI SURE FORM (STAPLE) ×1
STAPLER 60 SUREFORM DVNC (STAPLE) IMPLANT
STAPLER CANNULA SEAL DVNC XI (STAPLE) IMPLANT
STAPLER CANNULA SEAL XI (STAPLE)
STAPLER ECHELON POWER CIR 29 (STAPLE) ×1 IMPLANT
STAPLER ECHELON POWER CIR 31 (STAPLE) IMPLANT
STAPLER RELOAD 3.5X60 BLU DVNC (STAPLE)
STAPLER RELOAD 3.5X60 BLUE (STAPLE)
STAPLER RELOAD 4.3X60 GREEN (STAPLE) ×1
STAPLER RELOAD 4.3X60 GRN DVNC (STAPLE) ×1
STOPCOCK 4 WAY LG BORE MALE ST (IV SETS) ×4 IMPLANT
SUT ETHILON 2 0 PS N (SUTURE) IMPLANT
SUT NOVA NAB DX-16 0-1 5-0 T12 (SUTURE) ×4 IMPLANT
SUT PROLENE 2 0 KS (SUTURE) IMPLANT
SUT SILK 2 0 (SUTURE) ×1
SUT SILK 2 0 SH CR/8 (SUTURE) IMPLANT
SUT SILK 2-0 18XBRD TIE 12 (SUTURE) ×1 IMPLANT
SUT SILK 3 0 (SUTURE)
SUT SILK 3 0 SH CR/8 (SUTURE) ×2 IMPLANT
SUT SILK 3-0 18XBRD TIE 12 (SUTURE) IMPLANT
SUT V-LOC BARB 180 2/0GR6 GS22 (SUTURE)
SUT VIC AB 2-0 SH 18 (SUTURE) IMPLANT
SUT VIC AB 2-0 SH 27 (SUTURE)
SUT VIC AB 2-0 SH 27X BRD (SUTURE) IMPLANT
SUT VIC AB 3-0 SH 18 (SUTURE) IMPLANT
SUT VIC AB 4-0 PS2 27 (SUTURE) ×4 IMPLANT
SUT VICRYL 0 UR6 27IN ABS (SUTURE) ×2 IMPLANT
SUTURE V-LC BRB 180 2/0GR6GS22 (SUTURE) IMPLANT
SYR 10ML ECCENTRIC (SYRINGE) ×2 IMPLANT
SYS LAPSCP GELPORT 120MM (MISCELLANEOUS)
SYS WOUND ALEXIS 18CM MED (MISCELLANEOUS)
SYSTEM LAPSCP GELPORT 120MM (MISCELLANEOUS) IMPLANT
SYSTEM WOUND ALEXIS 18CM MED (MISCELLANEOUS) IMPLANT
TOWEL OR 17X26 10 PK STRL BLUE (TOWEL DISPOSABLE) IMPLANT
TOWEL OR NON WOVEN STRL DISP B (DISPOSABLE) ×2 IMPLANT
TRAY FOLEY MTR SLVR 16FR STAT (SET/KITS/TRAYS/PACK) ×2 IMPLANT
TROCAR ADV FIXATION 5X100MM (TROCAR) ×2 IMPLANT
TUBING CONNECTING 10 (TUBING) ×6 IMPLANT
TUBING INSUFFLATION 10FT LAP (TUBING) ×2 IMPLANT
TUBING UROLOGY SET (TUBING) IMPLANT

## 2021-04-22 NOTE — Interval H&P Note (Signed)
History and Physical Interval Note:  04/22/2021 7:19 AM  Richard Webb  has presented today for surgery, with the diagnosis of DIVERTICULUITIS.  The various methods of treatment have been discussed with the patient and family. After consideration of risks, benefits and other options for treatment, the patient has consented to  Procedure(s): XI ROBOT ASSISTED LAPAROSCOPIC PARTIAL COLECTOMY WITH LIVER BIOPSY (N/A) CYSTOSCOPY WITHE FIREFLY INJECTIONS (N/A) as a surgical intervention.  The patient's history has been reviewed, patient examined, no change in status, stable for surgery.  I have reviewed the patient's chart and labs.  Questions were answered to the patient's satisfaction.     Rosario Adie, MD  [facility]

## 2021-04-22 NOTE — Op Note (Signed)
04/22/2021  10:25 AM  PATIENT:  [name]  75 y.o. male  Patient Care Team: [name], MD as PCP - General (Internal Medicine) Satira Sark, MD as PCP - Cardiology (Cardiology)  PRE-OPERATIVE DIAGNOSIS:  DIVERTICULITIS, LIVER NODULARITY  POST-OPERATIVE DIAGNOSIS:  DIVERTICULITIS; LIVER NODULARITY  PROCEDURE:   XI ROBOT ASSISTED LAPAROSCOPIC SIGMOIDECTOMY WITH LIVER BIOPSY INTRAOPERATIVE ASSESSMENT OF PERFUSION CYSTOSCOPY WITHE FIREFLY INJECTIONS   Surgeon(s): Leighton Ruff, MD Michael Boston, MD Irine Seal, MD  ASSISTANT: Dr Johney Maine   ANESTHESIA:   local and general  EBL: 100 ml Total I/O In: 100 [IV Piggyback:100] Out: 250 [Urine:150; Blood:100]  Delay start of Pharmacological VTE agent (>24hrs) due to surgical blood loss or risk of bleeding:  no  DRAINS: none   SPECIMEN:  Source of Specimen:  Sigmoid colon  DISPOSITION OF SPECIMEN:  PATHOLOGY  COUNTS:  YES  PLAN OF CARE: Admit to inpatient   PATIENT DISPOSITION:  PACU - hemodynamically stable.  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  I recommended segmental resection:  The anatomy & physiology of the digestive tract was discussed.  The pathophysiology was discussed.  Natural history risks without surgery was discussed.   I worked to give an overview of the disease and the frequent need to have multispecialty involvement.  I feel the risks of no intervention will lead to serious problems that outweigh the operative risks; therefore, I recommended a partial colectomy to remove the pathology.  Laparoscopic & open techniques were discussed.   Risks such as bleeding, infection, abscess, leak, reoperation, possible ostomy, hernia, heart attack, death, and other risks were discussed.  I noted a good likelihood this will help address the problem.   Goals of post-operative recovery were discussed as well.    The patient expressed understanding & wished to proceed  with surgery.  OR FINDINGS:   Patient had sigmoid colon adherent to abscess cavity on the dome of the liver  Moderate nodularity of the liver.  The anastomosis rests 15 cm from the anal verge by rigid proctoscopy.  DESCRIPTION:   Informed consent was confirmed.  The patient underwent general anaesthesia without difficulty.  The patient was positioned appropriately.  VTE prevention in place.  The patient's abdomen was clipped, prepped, & draped in a sterile fashion.  Surgical timeout confirmed our plan.  The patient was positioned in reverse Trendelenburg.  Abdominal entry was gained using a Varies needle in the LUQ.  Entry was clean.  I induced carbon dioxide insufflation.  An 57m robotic port was placed in the LUQ.  Camera inspection revealed no injury.  Extra ports were carefully placed under direct laparoscopic visualization.  The omentum was adherent to the previous Kocher incision in the right upper quadrant.  I did not take this down at this time.  I laparoscopically reflected the  small bowel in the upper abdomen. The patient was appropriately positioned and the robot was docked to the patient's left side.  Instruments were placed under direct visualization.    I mobilized the sigmoid colon off of the pelvic sidewall.  There was quite a bit of anterior adhesions.  These were taken down circumferentially around the presumed colovesicular fistula tract using blunt dissection and the robotic vessel sealer.  I scored the base of peritoneum of the right side of the mesentery of the left colon from the ligament of Treitz to the peritoneal reflection of the mid rectum.  I elevated the sigmoid mesentery and enetered  into the retro-mesenteric plane. We were able to identify the left ureter and gonadal vessels. We kept those posterior within the retroperitoneum and elevated the left colon mesentery off that. I did isolated IMA pedicle but did not ligate it yet.  I continued distally and got into the  avascular plane posterior to the mesorectum. This allowed me to help mobilize the rectum as well by freeing the mesorectum off the sacrum.  I mobilized the peritoneal coverings towards the peritoneal reflection on both the right and left sides of the rectum.  I could see the right and left ureters and stayed away from them.    I skeletonized the inferior mesenteric artery pedicle.  After confirming the left ureter was out of the way, I went ahead and ligated the inferior mesenteric artery pedicle with bipolar robotic vessel sealer well above its takeoff from the aorta.  We ensured hemostasis.  I skeletonized the mesorectum at the junction at the proximal rectum using blunt dissection & bipolar robotic vessel sealer.  I mobilized the left colon in a lateral to medial fashion off the line of Toldt up towards the splenic flexure to ensure good mobilization of the left colon to reach into the pelvis. I then divided the mesentery at the rectosigmoid junction using the robotic vessel sealer.  At this point I divided the remaining attachments to the anterior abdominal wall using blunt dissection and the robotic vessel sealer.  There was an abscess cavity with purulence inside it.  This was suctioned away.  The bladder was irrigated with 300 mL of normal saline and methylene blue.  There was no leak noted with distention.  This was then drained.  I divided the mesentery up to the level of the sigmoid colon/descending junction using the robotic vessel sealer.  5 mL of diluted ICG was injected intravascularly to check for perfusion.  There was good perfusion of the colon down to the level of resection and good perfusion of the rectum distally.  At this point a 14m green load stapler was introduced into the 12 mm suprapubic port.  The rectosigmoid junction was divided using the stapler.  The robot was then undocked.  The suprapubic port was turned into a Pfannenstiel incision and the AVeronawound protector was placed.   The colon was then brought out through this wound and the proximal border was transected over a pursestring device and 2-0 Prolene pursestring.  A 29 mm EEA anvil was placed and the pursestring was tied tightly around this.  The specimen was sent to pathology for further examination.  This was then placed back into the abdomen and an anastomosis was created under laparoscopic visualization through the proximal end of the rectal stump.  The rectum was evaluated with a rigid proctoscope.  There was no leak of the anastomosis with insufflation under irrigation.  The anastomosis appeared hemostatic.  It was approximately 15 cm from the anal verge.  The rectum was decompressed.  We then turned our attention to the right upper quadrant.  I laparoscopically took down the adhesions of the omentum to the abdominal wall to evaluate the liver.  A liver biopsy needle was placed and directed into the liver until 2 good liver specimens were obtained.  Hemostasis was achieved using electrocautery.  These were also sent to pathology for evaluation due to liver nodularity seen on CT scan.  We inspected the entire abdomen for hemostasis.  There was no active bleeding.  The abdomen was desufflated and we switched to  clean gowns, gloves, instruments and drapes.  The Pfannenstiel peritoneum was closed using a running 0 Vicryl suture.  The fascia was closed using interrupted #1 Novafil sutures.  The subcutaneous tissue was reapproximated using a running 2-0 Vicryl suture.  The skin was closed using a running 4-0 Vicryl suture.  A sterile dressing was applied.  The remaining port sites were closed using interrupted 4-0 Vicryl sutures and Dermabond.  The patient was awakened from anesthesia and sent to the postanesthesia care unit in stable condition.  All counts were correct per operating room staff.  An MD assistant was necessary for tissue manipulation, retraction and positioning due to the complexity of the case and hospital  policies

## 2021-04-22 NOTE — Anesthesia Procedure Notes (Signed)
Procedure Name: Intubation Date/Time: 04/22/2021 7:34 AM Performed by: Lollie Sails, CRNA Pre-anesthesia Checklist: Patient identified, Emergency Drugs available, Suction available, Patient being monitored and Timeout performed Patient Re-evaluated:Patient Re-evaluated prior to induction Oxygen Delivery Method: Circle system utilized Preoxygenation: Pre-oxygenation with 100% oxygen Induction Type: IV induction Ventilation: Mask ventilation without difficulty and Oral airway inserted - appropriate to patient size Laryngoscope Size: Miller and 3 Grade View: Grade II Tube type: Oral Tube size: 8.0 mm Number of attempts: 1 Airway Equipment and Method: Stylet Placement Confirmation: ETT inserted through vocal cords under direct vision, positive ETCO2 and breath sounds checked- equal and bilateral Secured at: 24 cm Tube secured with: Tape Dental Injury: Teeth and Oropharynx as per pre-operative assessment

## 2021-04-22 NOTE — Op Note (Signed)
Procedure: Cystoscopy with bilateral ureteral catheterization and instillation of firefly.  Preop diagnosis: Colovesical fistula.  Postop diagnosis: Same.  Surgeon: Dr. Irine Seal.  Anesthesia: General.  Specimen: None.  Drain: Foley catheter.  EBL: None.  Complications: None.  Indications: Patient is a 75 year old male with a colovesical fistula who is undergoing colon resection today and instillation of firefly was requested.  He also has a history of BPH and bladder outlet obstruction that was treated with a UroLift procedure several months ago.  He has had recurrent bleeding with the UroLift.  Procedure: He was taken operating room where general anesthetic was induced.  He was given antibiotics per general surgery.  He was placed in lithotomy position and fitted with PAS hose.  His perineum and genitalia were prepped with Betadine solution he was draped in usual sterile fashion.  Cystoscopy was performed using the 21 [place] scope and 30 degree lens.  Examination revealed a normal urethra.  The external sphincter was intact.  The prostatic urethra was 2 to 3 cm in length with some lateral lobe enlargement with some changes anteriorly suggestive of UroLift implant but no exposed UroLift tabs were noted and no inflammatory changes were noted.  Examination of bladder revealed moderate trabeculation.  There was some purulent material in the base the bladder consistent with his known colovesical fistula.  With the fistula tract on the bladder dome.  Ureteral orifices were unremarkable.  A 5 French open-ended catheter was then passed up the right and left ureter sequentially to inject 7.5 mL of firefly solution into each.  The scope was then removed and a 18 [place] three-way Foley catheter was inserted.  The balloon was filled with 10 mL of sterile fluid.  The irrigation port was plugged and the drainage port was placed to a bedside bag.  The procedure was then turned over to general surgery.   There were no complications.

## 2021-04-22 NOTE — H&P (Signed)
I was asked in do cystoscopy with Firefly for Dr. Marcello Moores.   I have reviewed the risks of the procedure including bleeding, infection and urinary tract injury.

## 2021-04-22 NOTE — Anesthesia Postprocedure Evaluation (Signed)
Anesthesia Post Note  Patient: Wei Poplaski  Procedure(s) Performed: XI ROBOT ASSISTED LAPAROSCOPIC PARTIAL COLECTOMY WITH LIVER BIOPSY CYSTOSCOPY WITHE FIREFLY INJECTIONS     Patient location during evaluation: PACU Anesthesia Type: General Level of consciousness: awake Pain management: pain level controlled Vital Signs Assessment: post-procedure vital signs reviewed and stable Respiratory status: spontaneous breathing, nonlabored ventilation, respiratory function stable and patient connected to nasal cannula oxygen Cardiovascular status: blood pressure returned to baseline and stable Postop Assessment: no apparent nausea or vomiting Anesthetic complications: no   No notable events documented.  Last Vitals:  Vitals:   04/22/21 1630 04/22/21 1733  BP: 140/84 138/66  Pulse: 79 82  Resp: 17 17  Temp: 36.6 C 37.1 C  SpO2: 96% 94%    Last Pain:  Vitals:   04/22/21 1649  TempSrc:   PainSc: 2                  Addison Whidbee P Cleven Jansma

## 2021-04-22 NOTE — Discharge Instructions (Addendum)
Okay to resume your home medications including your blood thinners.  Consider holding sulfasalazine until discussing with Dr. Marcello Moores.  Call office Wednesday/Thursday for update on progress and to determine if okay to restart or hold off.   SURGERY: POST OP INSTRUCTIONS (Surgery for small bowel obstruction, colon resection, etc)   ######################################################################  EAT Gradually transition to a high fiber diet with a fiber supplement over the next few days after discharge  WALK Walk an hour a day.  Control your pain to do that.    CONTROL PAIN Control pain so that you can walk, sleep, tolerate sneezing/coughing, go up/down stairs.  HAVE A BOWEL MOVEMENT DAILY Keep your bowels regular to avoid problems.  OK to try a laxative to override constipation.  OK to use an antidairrheal to slow down diarrhea.  Call if not better after 2 tries  CALL IF YOU HAVE PROBLEMS/CONCERNS Call if you are still struggling despite following these instructions. Call if you have concerns not answered by these instructions  ######################################################################   DIET Follow a light diet the first few days at home.  Start with a bland diet such as soups, liquids, starchy foods, low fat foods, etc.  If you feel full, bloated, or constipated, stay on a ful liquid or pureed/blenderized diet for a few days until you feel better and no longer constipated. Be sure to drink plenty of fluids every day to avoid getting dehydrated (feeling dizzy, not urinating, etc.). Gradually add a fiber supplement to your diet over the next week.  Gradually get back to a regular solid diet.  Avoid fast food or heavy meals the first week as you are more likely to get nauseated. It is expected for your digestive tract to need a few months to get back to normal.  It is common for your bowel movements and stools to be irregular.  You will have occasional bloating and  cramping that should eventually fade away.  Until you are eating solid food normally, off all pain medications, and back to regular activities; your bowels will not be normal. Focus on eating a low-fat, high fiber diet the rest of your life (See Getting to Pony, below).  CARE of your INCISION or WOUND It is good for closed incision and even open wounds to be washed every day.  Shower every day.  Short baths are fine.  Wash the incisions and wounds clean with soap & water.     If you have a closed incision(s), wash the incision with soap & water every day.  You may leave closed incisions open to air if it is dry.   You may cover the incision with clean gauze & replace it after your daily shower for comfort.  It is good for closed incisions and even open wounds to be washed every day.  Shower every day.  Short baths are fine.  Wash the incisions and wounds clean with soap & water.    You may leave closed incisions open to air if it is dry.   You may cover the incision with clean gauze & replace it after your daily shower for comfort.  DERMABOND:  You have purple skin glue (Dermabond) on your incision(s).  Leave them in place, and they will fall off on their own like a scab in 2-3 weeks.  You may trim any edges that curl up with clean scissors.   If you have an open wound with a wound vac, see wound vac care instructions.  ACTIVITIES as tolerated Start light daily activities --- self-care, walking, climbing stairs-- beginning the day after surgery.  Gradually increase activities as tolerated.  Control your pain to be active.  Stop when you are tired.  Ideally, walk several times a day, eventually an hour a day.   Most people are back to most day-to-day activities in a few weeks.  It takes 4-8 weeks to get back to unrestricted, intense activity. If you can walk 30 minutes without difficulty, it is safe to try more intense activity such as jogging, treadmill, bicycling, low-impact  aerobics, swimming, etc. Save the most intensive and strenuous activity for last (Usually 4-8 weeks after surgery) such as sit-ups, heavy lifting, contact sports, etc.  Refrain from any intense heavy lifting or straining until you are off narcotics for pain control.  You will have off days, but things should improve week-by-week. DO NOT PUSH THROUGH PAIN.  Let pain be your guide: If it hurts to do something, don't do it.  Pain is your body warning you to avoid that activity for another week until the pain goes down. You may drive when you are no longer taking narcotic prescription pain medication, you can comfortably wear a seatbelt, and you can safely make sudden turns/stops to protect yourself without hesitating due to pain. You may have sexual intercourse when it is comfortable. If it hurts to do something, stop.  MEDICATIONS Take your usually prescribed home medications unless otherwise directed.   Blood thinners:  Usually you can restart any strong blood thinners after the second postoperative day.  It is OK to take aspirin right away.     If you are on strong blood thinners (warfarin/Coumadin, Plavix, Xerelto, Eliquis, Pradaxa, etc), discuss with your surgeon, medicine PCP, and/or cardiologist for instructions on when to restart the blood thinner & if blood monitoring is needed (PT/INR blood check, etc).     PAIN CONTROL Pain after surgery or related to activity is often due to strain/injury to muscle, tendon, nerves and/or incisions.  This pain is usually short-term and will improve in a few months.  To help speed the process of healing and to get back to regular activity more quickly, DO THE FOLLOWING THINGS TOGETHER: Increase activity gradually.  DO NOT PUSH THROUGH PAIN Use Ice and/or Heat Try Gentle Massage and/or Stretching Take over the counter pain medication Take Narcotic prescription pain medication for more severe pain  Good pain control = faster recovery.  It is better to take  more medicine to be more active than to stay in bed all day to avoid medications.  Increase activity gradually Avoid heavy lifting at first, then increase to lifting as tolerated over the next 6 weeks. Do not "push through" the pain.  Listen to your body and avoid positions and maneuvers than reproduce the pain.  Wait a few days before trying something more intense Walking an hour a day is encouraged to help your body recover faster and more safely.  Start slowly and stop when getting sore.  If you can walk 30 minutes without stopping or pain, you can try more intense activity (running, jogging, aerobics, cycling, swimming, treadmill, sex, sports, weightlifting, etc.) Remember: If it hurts to do it, then don't do it! Use Ice and/or Heat You will have swelling and bruising around the incisions.  This will take several weeks to resolve. Ice packs or heating pads (6-8 times a day, 30-60 minutes at a time) will help sooth soreness & bruising. Some people prefer to  use ice alone, heat alone, or alternate between ice & heat.  Experiment and see what works best for you.  Consider trying ice for the first few days to help decrease swelling and bruising; then, switch to heat to help relax sore spots and speed recovery. Shower every day.  Short baths are fine.  It feels good!  Keep the incisions and wounds clean with soap & water.   Try Gentle Massage and/or Stretching Massage at the area of pain many times a day Stop if you feel pain - do not overdo it Take over the counter pain medication This helps the muscle and nerve tissues become less irritable and calm down faster Choose ONE of the following over-the-counter anti-inflammatory medications: Acetaminophen 563m tabs (Tylenol) 1-2 pills with every meal and just before bedtime (avoid if you have liver problems or if you have acetaminophen in you narcotic prescription) Naproxen 2257mtabs (ex. Aleve, Naprosyn) 1-2 pills twice a day (avoid if you have  kidney, stomach, IBD, or bleeding problems) Ibuprofen 20026mabs (ex. Advil, Motrin) 3-4 pills with every meal and just before bedtime (avoid if you have kidney, stomach, IBD, or bleeding problems) Take with food/snack several times a day as directed for at least 2 weeks to help keep pain / soreness down & more manageable. Take Narcotic prescription pain medication for more severe pain A prescription for strong pain control is often given to you upon discharge (for example: oxycodone/Percocet, hydrocodone/Norco/Vicodin, or tramadol/Ultram) Take your pain medication as prescribed. Be mindful that most narcotic prescriptions contain Tylenol (acetaminophen) as well - avoid taking too much Tylenol. If you are having problems/concerns with the prescription medicine (does not control pain, nausea, vomiting, rash, itching, etc.), please call us [phone] see if we need to switch you to a different pain medicine that will work better for you and/or control your side effects better. If you need a refill on your pain medication, you must call the office before 4 pm and on weekdays only.  By federal law, prescriptions for narcotics cannot be called into a pharmacy.  They must be filled out on paper & picked up from our office by the patient or authorized caretaker.  Prescriptions cannot be filled after 4 pm nor on weekends.    WHEN TO CALL US [phone] uncontrolled or worsening pain  Fever over 101 F (38.5 C) Concerns with the incision: Worsening pain, redness, rash/hives, swelling, bleeding, or drainage Reactions / problems with new medications (itching, rash, hives, nausea, etc.) Nausea and/or vomiting Difficulty urinating Difficulty breathing Worsening fatigue, dizziness, lightheadedness, blurred vision Other concerns If you are not getting better after two weeks or are noticing you are getting worse, contact our office [phone] for further advice.  We may need to adjust your  medications, re-evaluate you in the office, send you to the emergency room, or see what other things we can do to help. The clinic staff is available to answer your questions during regular business hours (8:30am-5pm).  Please don't hesitate to call and ask to speak to one of our nurses for clinical concerns.    [facility] from [facility] is always on call at the hospitals 24 hours/day If you have a medical emergency, go to the nearest emergency room or call 911.  FOLLOW UP in our office One the day of your discharge from the hospital (or the next business weekday), please call [facility] to set up or confirm an appointment to see  your surgeon in the office for a follow-up appointment.  Usually it is 2-3 weeks after your surgery.   If you have skin staples at your incision(s), let the office know so we can set up a time in the office for the nurse to remove them (usually around 10 days after surgery). Make sure that you call for appointments the day of discharge (or the next business weekday) from the hospital to ensure a convenient appointment time. IF YOU HAVE DISABILITY OR FAMILY LEAVE FORMS, BRING THEM TO THE OFFICE FOR PROCESSING.  DO NOT GIVE THEM TO YOUR DOCTOR.  [facility] [address] [place], [place], [place]  [address] ? [phone] - Main [phone] - [place],  [phone] - Fax [email or web address]  GETTING TO GOOD BOWEL HEALTH. It is expected for your digestive tract to need a few months to get back to normal.  It is common for your bowel movements and stools to be irregular.  You will have occasional bloating and cramping that should eventually fade away.  Until you are eating solid food normally, off all pain medications, and back to regular activities; your bowels will not be normal.   Avoiding constipation The goal: ONE SOFT BOWEL MOVEMENT A DAY!    Drink plenty of fluids.  Choose water first. TAKE A  FIBER SUPPLEMENT EVERY DAY THE REST OF YOUR LIFE During your first week back home, gradually add back a fiber supplement every day Experiment which form you can tolerate.   There are many forms such as powders, tablets, wafers, gummies, etc Psyllium bran (Metamucil), methylcellulose (Citrucel), Miralax or Glycolax, Benefiber, Flax Seed.  Adjust the dose week-by-week (1/2 dose/day to 6 doses a day) until you are moving your bowels 1-2 times a day.  Cut back the dose or try a different fiber product if it is giving you problems such as diarrhea or bloating. Sometimes a laxative is needed to help jump-start bowels if constipated until the fiber supplement can help regulate your bowels.  If you are tolerating eating & you are farting, it is okay to try a gentle laxative such as double dose MiraLax, prune juice, or Milk of Magnesia.  Avoid using laxatives too often. Stool softeners can sometimes help counteract the constipating effects of narcotic pain medicines.  It can also cause diarrhea, so avoid using for too long. If you are still constipated despite taking fiber daily, eating solids, and a few doses of laxatives, call our office. Controlling diarrhea Try drinking liquids and eating bland foods for a few days to avoid stressing your intestines further. Avoid dairy products (especially milk & ice cream) for a short time.  The intestines often can lose the ability to digest lactose when stressed. Avoid foods that cause gassiness or bloating.  Typical foods include beans and other legumes, cabbage, broccoli, and dairy foods.  Avoid greasy, spicy, fast foods.  Every person has some sensitivity to other foods, so listen to your body and avoid those foods that trigger problems for you. Probiotics (such as active yogurt, Align, etc) may help repopulate the intestines and colon with normal bacteria and calm down a sensitive digestive tract Adding a fiber supplement gradually can help thicken stools by  absorbing excess fluid and retrain the intestines to act more normally.  Slowly increase the dose over a few weeks.  Too much fiber too soon can backfire and cause cramping & bloating. It is okay to try and slow down diarrhea with a few  doses of antidiarrheal medicines.   Bismuth subsalicylate (ex. Kayopectate, Pepto Bismol) for a few doses can help control diarrhea.  Avoid if pregnant.   Loperamide (Imodium) can slow down diarrhea.  Start with one tablet (50m) first.  Avoid if you are having fevers or severe pain.  ILEOSTOMY PATIENTS WILL HAVE CHRONIC DIARRHEA since their colon is not in use.    Drink plenty of liquids.  You will need to drink even more glasses of water/liquid a day to avoid getting dehydrated. Record output from your ileostomy.  Expect to empty the bag every 3-4 hours at first.  Most people with a permanent ileostomy empty their bag 4-6 times at the least.   Use antidiarrheal medicine (especially Imodium) several times a day to avoid getting dehydrated.  Start with a dose at bedtime & breakfast.  Adjust up or down as needed.  Increase antidiarrheal medications as directed to avoid emptying the bag more than 8 times a day (every 3 hours). Work with your wound ostomy nurse to learn care for your ostomy.  See ostomy care instructions. TROUBLESHOOTING IRREGULAR BOWELS 1) Start with a soft & bland diet. No spicy, greasy, or fried foods.  2) Avoid gluten/wheat or dairy products from diet to see if symptoms improve. 3) Miralax 17gm or flax seed mixed in [place] water or juice-daily. May use 2-4 times a day as needed. 4) Gas-X, Phazyme, etc. as needed for gas & bloating.  5) Prilosec (omeprazole) over-the-counter as needed 6)  Consider probiotics (Align, Activa, etc) to help calm the bowels down  Call your doctor if you are getting worse or not getting better.  Sometimes further testing (cultures, endoscopy, X-ray studies, CT scans, bloodwork, etc.) may be needed to help diagnose and treat  the cause of the diarrhea. [facility] [place] [place]  [phone]- Main.    [phone] - Toll Free.   [phone] Fax [email or web address]

## 2021-04-22 NOTE — Transfer of Care (Signed)
Immediate Anesthesia Transfer of Care Note  Patient: Richard Webb  Procedure(s) Performed: XI ROBOT ASSISTED LAPAROSCOPIC PARTIAL COLECTOMY WITH LIVER BIOPSY CYSTOSCOPY WITHE FIREFLY INJECTIONS  Patient Location: PACU  Anesthesia Type:General  Level of Consciousness: awake, alert , oriented and patient cooperative  Airway & Oxygen Therapy: Patient Spontanous Breathing and Patient connected to face mask oxygen  Post-op Assessment: Report given to RN and Post -op Vital signs reviewed and stable  Post vital signs: Reviewed and stable  Last Vitals:  Vitals Value Taken Time  BP 146/78 04/22/21 1036  Temp    Pulse 64 04/22/21 1039  Resp 21 04/22/21 1039  SpO2 97 % 04/22/21 1039  Vitals shown include unvalidated device data.  Last Pain:  Vitals:   04/22/21 0558  TempSrc: Oral  PainSc:          Complications: No notable events documented.

## 2021-04-23 ENCOUNTER — Encounter (HOSPITAL_COMMUNITY): Payer: Self-pay | Admitting: General Surgery

## 2021-04-23 ENCOUNTER — Inpatient Hospital Stay (HOSPITAL_COMMUNITY): Payer: Medicare Other

## 2021-04-23 LAB — CBC
HCT: 27.4 % — ABNORMAL LOW (ref 39.0–52.0)
Hemoglobin: 8.5 g/dL — ABNORMAL LOW (ref 13.0–17.0)
MCH: 27.8 pg (ref 26.0–34.0)
MCHC: 31 g/dL (ref 30.0–36.0)
MCV: 89.5 fL (ref 80.0–100.0)
Platelets: 215 10*3/uL (ref 150–400)
RBC: 3.06 MIL/uL — ABNORMAL LOW (ref 4.22–5.81)
RDW: 16.8 % — ABNORMAL HIGH (ref 11.5–15.5)
WBC: 10.9 10*3/uL — ABNORMAL HIGH (ref 4.0–10.5)
nRBC: 0 % (ref 0.0–0.2)

## 2021-04-23 LAB — BASIC METABOLIC PANEL
Anion gap: 3 — ABNORMAL LOW (ref 5–15)
BUN: 12 mg/dL (ref 8–23)
CO2: 25 mmol/L (ref 22–32)
Calcium: 7.8 mg/dL — ABNORMAL LOW (ref 8.9–10.3)
Chloride: 107 mmol/L (ref 98–111)
Creatinine, Ser: 0.9 mg/dL (ref 0.61–1.24)
GFR, Estimated: >60 mL/min (ref >60)
Glucose, Bld: 123 mg/dL — ABNORMAL HIGH (ref 70–99)
Potassium: 3.7 mmol/L (ref 3.5–5.1)
Sodium: 135 mmol/L (ref 135–145)

## 2021-04-23 MED ORDER — POTASSIUM CHLORIDE 2 MEQ/ML IV SOLN: INTRAVENOUS | Status: DC

## 2021-04-23 MED ORDER — APIXABAN 5 MG PO TABS
5.0000 mg | ORAL_TABLET | Freq: Two times a day (BID) | ORAL | Status: DC
  Administered 2021-04-24 – 2021-04-25 (×3): 5 mg via ORAL
  Filled 2021-04-23 (×3): qty 1

## 2021-04-23 MED ORDER — KCL IN DEXTROSE-NACL 20-5-0.45 MEQ/L-%-% IV SOLN
INTRAVENOUS | Status: DC
  Filled 2021-04-23 (×2): qty 1000

## 2021-04-23 MED ORDER — SODIUM CHLORIDE 0.9 % IV SOLN
2.0000 g | Freq: Two times a day (BID) | INTRAVENOUS | Status: DC
  Administered 2021-04-23 – 2021-04-25 (×4): 2 g via INTRAVENOUS
  Filled 2021-04-23 (×4): qty 2

## 2021-04-23 MED ORDER — ACETAMINOPHEN 500 MG PO TABS
500.0000 mg | ORAL_TABLET | Freq: Four times a day (QID) | ORAL | Status: DC
Start: 2021-04-23 — End: 2021-04-25
  Administered 2021-04-24 – 2021-04-25 (×3): 500 mg via ORAL
  Filled 2021-04-23 (×6): qty 1

## 2021-04-23 NOTE — Progress Notes (Signed)
Notified that pt has a temp spike up to 102.1, pulse up sl @ 104 this evening and pt is a yellow mews. MD notified and orders received/implemented. Will cont to monitor.

## 2021-04-23 NOTE — Progress Notes (Signed)
     Discussed fever with nursing twice this evening.  WBC 10K this AM - will repeat in AM.  CXR with no acute findings this evening.  Restarted IV abx's given peri-operatively.  Tylenol for fever.  Will monitor.  Armandina Gemma, MD [facility] [place] [facility] Office: [phone]

## 2021-04-23 NOTE — Progress Notes (Signed)
Pt having the shakes and chills. VS ck =  Temp 98.8, BP 132/76, P 82, O2 sat 95% on RA, skin warm and dry. Complains of mild abd pain. Assisted pt back to bed and prn pain med given. Will cont to monitor.

## 2021-04-23 NOTE — Progress Notes (Signed)
Richard Webb [id number] [birth year]  CARE TEAM:  PCP: [name], MD  Outpatient Care Team: Patient Care Team: [name], MD as PCP - General (Internal Medicine) Satira Sark, MD as PCP - Cardiology (Cardiology)  Inpatient Treatment Team: Treatment Team: Attending Provider: Leighton Ruff, MD; Consulting Physician: Michael Boston, MD; Registered Nurse: Dorinda Hill, RN; Utilization Review: Conception Oms, RN; Social Worker: Lennart Pall R, [place]   Problem List:   Active Problems:   Colovesical fistula   1 Day Post-Op    04/22/2021  POST-OPERATIVE DIAGNOSIS:  DIVERTICULITIS; LIVER NODULARITY   PROCEDURE:   XI ROBOT ASSISTED LAPAROSCOPIC SIGMOIDECTOMY CORE LIVER BIOPSY INTRAOPERATIVE ASSESSMENT OF PERFUSION    Surgeon(s): Leighton Ruff, MD  4/0/9811 Procedure: Cystoscopy with bilateral ureteral catheterization and instillation of firefly.  Preop diagnosis: Colovesical fistula.  Postop diagnosis: Same.  Surgeon: Dr. Irine Seal.  Assessment  Recovering relatively well  [facility] Stay = 1 days)  Plan:  ERAS protocol.  Advance diet.  Stop IV fluids.  Most likely can remove Foley and since mobilizing quite well no evidence of any bladder leak.  Patient wishes to get it out.  Follow-up on pathology.  Follow hemoglobin.  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      20 minutes spent in review, evaluation,  examination, counseling, and coordination of care.   I have reviewed this patient's available data, including medical history, events of note, physical examination and test results as part of my evaluation.  A significant portion of that time was spent in counseling.  Care during the described time interval was provided by me.  04/23/2021    Subjective: (Chief complaint)  Patient denies discomfort.  Pain controlled  Tolerating liquids.  Sitting up in chair.  Walking well in hallways.  Objective:  Vital signs:  Vitals:   04/23/21 0540 04/23/21 0600 04/23/21 0756 04/23/21 0932  BP: (!) 97/52   (!) 105/56  Pulse: 66   67  Resp: 18   17  Temp: 98.1 F (36.7 C)   97.7 F (36.5 C)  TempSrc: Oral     SpO2: 94%  92% 94%  Weight:  88.8 kg    Height:        Last BM Date: 04/21/21  Intake/Output   Yesterday:  09/02 0701 - 09/03 0700 In: 3102.5 [P.O.:1560; I.V.:1442.5; IV Piggyback:100] Out: 9147 [Urine:3325; Blood:100] This shift:  Total I/O In: 840 [P.O.:840] Out: 300 [Stool:300]  Bowel function:  Flatus: YES  BM:  No  Drain: (No drain)   Physical Exam:  General: Pt awake/alert in no acute distress Eyes: PERRL, normal EOM.  Sclera clear.  No icterus Neuro: CN II-XII intact w/o focal sensory/motor deficits. Lymph: No head/neck/groin lymphadenopathy Psych:  No delerium/psychosis/paranoia.  Oriented x 4 HENT: Normocephalic, Mucus membranes moist.  No thrush Neck: Supple, No tracheal deviation.  No obvious thyromegaly Chest: No pain to chest wall compression.  Good respiratory excursion.  No audible wheezing CV:  Pulses intact.  Regular rhythm.  No major extremity edema MS: Normal AROM mjr joints.  No obvious deformity  Abdomen: Soft.  Nondistended.  Mildly tender at incisions only.  No evidence of peritonitis.  No incarcerated hernias.  GU: Foley catheter in place with clear light yellow-colored urine.  No clots or hematuria or purulent Ext:   No deformity.  No  mjr edema.  No cyanosis Skin: No petechiae / purpurea.  No major sores.  Warm and dry    Results:   Cultures: No results found for this or any previous visit (from the past 720 hour(s)).  Labs: Results for orders placed or performed during the hospital encounter of 04/22/21 (from the past 48 hour(s))  CBC     Status: Abnormal   Collection Time: 04/23/21  4:43 AM  Result Value Ref Range   WBC 10.9 (H) 4.0 - 10.5 K/uL   RBC 3.06 (L) 4.22 - 5.81 MIL/uL   Hemoglobin 8.5 (L) 13.0 - 17.0 g/dL   HCT 27.4 (L) 39.0 - 52.0 %   MCV 89.5 80.0 - 100.0 fL   MCH 27.8 26.0 - 34.0 pg   MCHC 31.0 30.0 - 36.0 g/dL   RDW 16.8 (H) 11.5 - 15.5 %   Platelets 215 150 - 400 K/uL   nRBC 0.0 0.0 - 0.2 %    Comment: Performed at [facility], [place] [address] [place], [place] [address]  Basic metabolic panel     Status: Abnormal   Collection Time: 04/23/21  4:43 AM  Result Value Ref Range   Sodium 135 135 - 145 mmol/L   Potassium 3.7 3.5 - 5.1 mmol/L   Chloride 107 98 - 111 mmol/L   CO2 25 22 - 32 mmol/L   Glucose, Bld 123 (H) 70 - 99 mg/dL    Comment: Glucose reference range applies only to samples taken after fasting for at least 8 hours.   BUN 12 8 - 23 mg/dL   Creatinine, Ser 0.90 0.61 - 1.24 mg/dL   Calcium 7.8 (L) 8.9 - 10.3 mg/dL   GFR, Estimated >60 >60 mL/min    Comment: (NOTE) Calculated using the CKD-EPI Creatinine Equation (2021)    Anion gap 3 (L) 5 - 15    Comment: Performed at [facility], [place] [address], [place], [place] [address]    Imaging / Studies: No results found.  Medications / Allergies: per chart  Antibiotics: Anti-infectives (From admission, onward)    Start     Dose/Rate Route Frequency Ordered Stop   04/22/21 2200  cefoTEtan (CEFOTAN) 2 g in sodium chloride 0.9 % 100 mL IVPB        2 g 200 mL/hr over 30 Minutes Intravenous Every 12 hours 04/22/21 1555 04/22/21 2315   04/22/21 0600  cefoTEtan (CEFOTAN) 2 g in sodium  chloride 0.9 % 100 mL IVPB        2 g 200 mL/hr over 30 Minutes Intravenous On call to O.R. 04/22/21 0513 04/22/21 1656         Note: Portions of this report may have been transcribed using voice recognition software. Every effort was made to ensure accuracy; however, inadvertent computerized transcription errors may be present.   Any transcriptional errors that result from this process are unintentional.    Adin Hector, MD, FACS, MASCRS Esophageal, Gastrointestinal & Colorectal Surgery Robotic and Minimally Invasive Surgery  [facility], [place]. [address], [place], [place] [address] [phone]  Fax [phone] Main  CONTACT INFORMATION:  Weekday (9AM-5PM): Call [facility] main office at [phone]  Weeknight (5PM-9AM) or Weekend/Holiday: Check [email or web address] (password " [email or web address]") for General Surgery [facility] coverage  (Please, do not use SecureChat as it is not reliable communication to operating surgeons for immediate patient care)      04/23/2021  9:50 AM

## 2021-04-23 NOTE — Progress Notes (Signed)
Pt still in yellow MEWS. Temp 103, BP 129/62, HR 105, O2 92%, RR 16. Gerkin, MD notified. Pt stated his abdomen hurts when moving but only aches when resting. Will continue to monitor.

## 2021-04-24 LAB — CBC
HCT: 27.9 % — ABNORMAL LOW (ref 39.0–52.0)
Hemoglobin: 8.5 g/dL — ABNORMAL LOW (ref 13.0–17.0)
MCH: 27.7 pg (ref 26.0–34.0)
MCHC: 30.5 g/dL (ref 30.0–36.0)
MCV: 90.9 fL (ref 80.0–100.0)
Platelets: 209 10*3/uL (ref 150–400)
RBC: 3.07 MIL/uL — ABNORMAL LOW (ref 4.22–5.81)
RDW: 16.8 % — ABNORMAL HIGH (ref 11.5–15.5)
WBC: 11.5 10*3/uL — ABNORMAL HIGH (ref 4.0–10.5)
nRBC: 0 % (ref 0.0–0.2)

## 2021-04-24 LAB — BASIC METABOLIC PANEL
Anion gap: 5 (ref 5–15)
BUN: 7 mg/dL — ABNORMAL LOW (ref 8–23)
CO2: 25 mmol/L (ref 22–32)
Calcium: 8.4 mg/dL — ABNORMAL LOW (ref 8.9–10.3)
Chloride: 110 mmol/L (ref 98–111)
Creatinine, Ser: 0.88 mg/dL (ref 0.61–1.24)
GFR, Estimated: >60 mL/min (ref >60)
Glucose, Bld: 119 mg/dL — ABNORMAL HIGH (ref 70–99)
Potassium: 3.6 mmol/L (ref 3.5–5.1)
Sodium: 140 mmol/L (ref 135–145)

## 2021-04-24 LAB — MAGNESIUM: Magnesium: 2 mg/dL (ref 1.7–2.4)

## 2021-04-24 MED ORDER — LACTATED RINGERS IV BOLUS: 1000.0000 mL | Freq: Three times a day (TID) | INTRAVENOUS | Status: DC | PRN

## 2021-04-24 MED ORDER — SODIUM CHLORIDE 0.9 % IV SOLN: 250.0000 mL | INTRAVENOUS | Status: DC | PRN

## 2021-04-24 MED ORDER — SODIUM CHLORIDE 0.9% FLUSH
3.0000 mL | Freq: Two times a day (BID) | INTRAVENOUS | Status: DC
  Administered 2021-04-24 – 2021-04-25 (×3): 3 mL via INTRAVENOUS

## 2021-04-24 MED ORDER — ACETAMINOPHEN 500 MG PO TABS: 500.0000 mg | ORAL_TABLET | Freq: Four times a day (QID) | ORAL | Status: DC | PRN

## 2021-04-24 MED ORDER — SODIUM CHLORIDE 0.9% FLUSH: 3.0000 mL | INTRAVENOUS | Status: DC | PRN

## 2021-04-24 NOTE — Progress Notes (Signed)
Richard Webb [id number] [birth year]  CARE TEAM:  PCP: [name], MD  Outpatient Care Team: Patient Care Team: [name], MD as PCP - General (Internal Medicine) Satira Sark, MD as PCP - Cardiology (Cardiology)  Inpatient Treatment Team: Treatment Team: Attending Provider: Leighton Ruff, MD; Consulting Physician: Michael Boston, MD; Utilization Review: Conception Oms, RN; Registered Nurse: Dorinda Hill, RN; Social Worker: Adelene Amas, LCSWA   Problem List:   Active Problems:   Colovesical fistula   2 Days Post-Op    04/22/2021  POST-OPERATIVE DIAGNOSIS:  DIVERTICULITIS; LIVER NODULARITY   PROCEDURE:   XI ROBOT ASSISTED LAPAROSCOPIC SIGMOIDECTOMY CORE LIVER BIOPSY INTRAOPERATIVE ASSESSMENT OF PERFUSION    Surgeon(s): Leighton Ruff, MD  04/23/8181 Procedure: Cystoscopy with bilateral ureteral catheterization and instillation of firefly.  Preop diagnosis: Colovesical fistula.  Postop diagnosis: Same.  Surgeon: Dr. Irine Seal.  Assessment  Recovering OK  [facility] Stay = 2 days)  Plan:  ERAS protocol.  Advance diet.  Follow off IV fluids with as needed backup.  Hypertension control.  Low threshold to hold if drops blood pressure again.  Postoperative on top of chronic anemia.  Relatively stable.  Restart apixaban/Eliquis today.  Follow hemoglobin closely.    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  Transition from prophylactic to full oral  anticoagulation.  Follow hemoglobin closely follow on full anticoagulation.  Follow-up on pathology.  -VTE prophylaxis- SCDs, etc  -mobilize as tolerated to help recovery  Disposition:  Disposition:  The patient is from: Home  Anticipate discharge to:  Home  Anticipated Date of Discharge is:  September 5,2022   Barriers to discharge:  Pending Clinical improvement (more likely than not)  Patient currently is NOT MEDICALLY STABLE for discharge from the  hospital from a surgery standpoint.      20 minutes spent in review, evaluation, examination, counseling, and coordination of care.   I have reviewed this patient's available data, including medical history, events of note, physical examination and test results as part of my evaluation.  A significant portion of that time was spent in counseling.  Care during the described time interval was provided by me.  04/24/2021    Subjective: (Chief complaint)  Some fever.  No nausea or vomiting.  No diarrhea.  No dysuria.    Feels better this morning but tired.  Did not sleep well  Tolerated soft diet.  Denies pain  Objective:  Vital signs:  Vitals:   04/24/21 0130 04/24/21 0617 04/24/21 0819 04/24/21 0913  BP: 112/63 132/66  134/66  Pulse: 81 85  78  Resp: 16 16  15   Temp: 99.6 F (37.6 C) 99.6 F (37.6 C)  98.6 F (37 C)  TempSrc: Oral Oral  Oral  SpO2: (!) 89% 92% 92% 100%  Weight:      Height:        Last BM Date: 04/21/21  Intake/Output   Yesterday:  09/03 0701 - 09/04 0700 In: 4200 [P.O.:3000; I.V.:1000; IV Piggyback:200] Out: 3700 [Urine:3400; Stool:300] This shift:  Total I/O In: 240 [P.O.:240] Out: -   Bowel function:  Flatus: YES  BM:  No  Drain: (No drain)   Physical Exam:  General: Pt resting but awakens to be awake/alert in no acute distress Eyes: PERRL, normal EOM.  Sclera clear.  No icterus Neuro: CN II-XII intact w/o focal sensory/motor deficits. Lymph: No head/neck/groin lymphadenopathy Psych:  No delerium/psychosis/paranoia.  Oriented x 4 HENT: Normocephalic, Mucus membranes moist.  No thrush Neck: Supple, No  tracheal deviation.  No obvious thyromegaly Chest: No pain to chest wall compression.  Good respiratory excursion.  No audible wheezing CV:  Pulses intact.  Regular rhythm.  No major extremity edema MS: Normal AROM mjr joints.  No obvious deformity  Abdomen: Soft.  Nondistended.  Mildly tender at incisions only.  Incisions clean  dry and intact.  No guarding.  No evidence of peritonitis.  No incarcerated hernias.  GU: Foley catheter in place with clear light yellow-colored urine.  No clots or hematuria or purulent Ext:   No deformity.  No mjr edema.  No cyanosis Skin: No petechiae / purpurea.  No major sores.  Warm and dry    Results:   Cultures: No results found for this or any previous visit (from the past 720 hour(s)).  Labs: Results for orders placed or performed during the hospital encounter of 04/22/21 (from the past 48 hour(s))  CBC     Status: Abnormal   Collection Time: 04/23/21  4:43 AM  Result Value Ref Range   WBC 10.9 (H) 4.0 - 10.5 K/uL   RBC 3.06 (L) 4.22 - 5.81 MIL/uL   Hemoglobin 8.5 (L) 13.0 - 17.0 g/dL   HCT 27.4 (L) 39.0 - 52.0 %   MCV 89.5 80.0 - 100.0 fL   MCH 27.8 26.0 - 34.0 pg   MCHC 31.0 30.0 - 36.0 g/dL   RDW 16.8 (H) 11.5 - 15.5 %   Platelets 215 150 - 400 K/uL   nRBC 0.0 0.0 - 0.2 %    Comment: Performed at [facility], [place] [address] [place], [place] [address]  Basic metabolic panel     Status: Abnormal   Collection Time: 04/23/21  4:43 AM  Result Value Ref Range   Sodium 135 135 - 145 mmol/L   Potassium 3.7 3.5 - 5.1 mmol/L   Chloride 107 98 - 111 mmol/L   CO2 25 22 - 32 mmol/L   Glucose, Bld 123 (H) 70 - 99 mg/dL    Comment: Glucose reference range applies only to samples taken after fasting for at least 8 hours.   BUN 12 8 - 23 mg/dL   Creatinine, Ser 0.90 0.61 - 1.24 mg/dL   Calcium 7.8 (L) 8.9 - 10.3 mg/dL   GFR, Estimated >60 >60 mL/min    Comment: (NOTE) Calculated using the CKD-EPI Creatinine Equation (2021)    Anion gap 3 (L) 5 - 15    Comment: Performed at [facility], [place] [address] [place], [place] [address]  CBC     Status: Abnormal   Collection Time: 04/24/21  4:05 AM  Result Value Ref Range   WBC 11.5 (H) 4.0 - 10.5 K/uL   RBC 3.07 (L) 4.22 - 5.81 MIL/uL   Hemoglobin 8.5 (L) 13.0 - 17.0 g/dL   HCT 27.9  (L) 39.0 - 52.0 %   MCV 90.9 80.0 - 100.0 fL   MCH 27.7 26.0 - 34.0 pg   MCHC 30.5 30.0 - 36.0 g/dL   RDW 16.8 (H) 11.5 - 15.5 %   Platelets 209 150 - 400 K/uL   nRBC 0.0 0.0 - 0.2 %    Comment: Performed at [facility], [place] [address] [place], [place] [address]  Basic metabolic panel     Status: Abnormal   Collection Time: 04/24/21  4:05 AM  Result Value Ref Range   Sodium 140 135 - 145 mmol/L   Potassium 3.6 3.5 - 5.1 mmol/L   Chloride  110 98 - 111 mmol/L   CO2 25 22 - 32 mmol/L   Glucose, Bld 119 (H) 70 - 99 mg/dL    Comment: Glucose reference range applies only to samples taken after fasting for at least 8 hours.   BUN 7 (L) 8 - 23 mg/dL   Creatinine, Ser 0.88 0.61 - 1.24 mg/dL   Calcium 8.4 (L) 8.9 - 10.3 mg/dL   GFR, Estimated >60 >60 mL/min    Comment: (NOTE) Calculated using the CKD-EPI Creatinine Equation (2021)    Anion gap 5 5 - 15    Comment: Performed at [facility], [place] [address], [place], [place] [address]  Magnesium     Status: None   Collection Time: 04/24/21  4:05 AM  Result Value Ref Range   Magnesium 2.0 1.7 - 2.4 mg/dL    Comment: Performed at [facility], [place] [address], [place], [place] [address]    Imaging / Studies: DG CHEST PORT 1 VIEW  Result Date: 04/23/2021 CLINICAL DATA:  Fever, shortness of breath, cough. EXAM: PORTABLE CHEST 1 VIEW COMPARISON:  01/23/2021 FINDINGS: Heart is normal size. Prominent hila bilaterally, stable since prior CT, compatible with pulmonary arterial hypertension. Chronic scarring in the upper lobes and left base. No acute confluent opacities or effusions. No acute bony abnormality. IMPRESSION: Prominent hila bilaterally, stable since prior CT compatible with pulmonary arterial hypertension. Chronic changes. No active disease. Electronically Signed   By: Rolm Baptise M.D.   On: 04/23/2021 20:40    Medications / Allergies: per chart  Antibiotics: Anti-infectives  (From admission, onward)    Start     Dose/Rate Route Frequency Ordered Stop   04/23/21 1800  cefoTEtan (CEFOTAN) 2 g in sodium chloride 0.9 % 100 mL IVPB        2 g 200 mL/hr over 30 Minutes Intravenous Every 12 hours 04/23/21 1751     04/22/21 2200  cefoTEtan (CEFOTAN) 2 g in sodium chloride 0.9 % 100 mL IVPB        2 g 200 mL/hr over 30 Minutes Intravenous Every 12 hours 04/22/21 1555 04/22/21 2315   04/22/21 0600  cefoTEtan (CEFOTAN) 2 g in sodium chloride 0.9 % 100 mL IVPB        2 g 200 mL/hr over 30 Minutes Intravenous On call to O.R. 04/22/21 0513 04/22/21 1656         Note: Portions of this report may have been transcribed using voice recognition software. Every effort was made to ensure accuracy; however, inadvertent computerized transcription errors may be present.   Any transcriptional errors that result from this process are unintentional.    Adin Hector, MD, FACS, MASCRS Esophageal, Gastrointestinal & Colorectal Surgery Robotic and Minimally Invasive Surgery  [facility], [place]  [place]. [address] [place], [place] [address] [phone] Fax [phone] Main  CONTACT INFORMATION:  Weekday (9AM-5PM): Call [facility] main office at [phone]  Weeknight (5PM-9AM) or Weekend/Holiday: Check [email or web address] (password " [email or web address]") for General Surgery [facility] coverage  (Please, do not use SecureChat as it is not reliable communication to operating surgeons for immediate patient care)      04/24/2021  10:50 AM

## 2021-04-25 LAB — BASIC METABOLIC PANEL
Anion gap: 5 (ref 5–15)
BUN: 11 mg/dL (ref 8–23)
CO2: 25 mmol/L (ref 22–32)
Calcium: 8.2 mg/dL — ABNORMAL LOW (ref 8.9–10.3)
Chloride: 108 mmol/L (ref 98–111)
Creatinine, Ser: 0.87 mg/dL (ref 0.61–1.24)
GFR, Estimated: >60 mL/min (ref >60)
Glucose, Bld: 115 mg/dL — ABNORMAL HIGH (ref 70–99)
Potassium: 3.4 mmol/L — ABNORMAL LOW (ref 3.5–5.1)
Sodium: 138 mmol/L (ref 135–145)

## 2021-04-25 LAB — CBC
HCT: 26.4 % — ABNORMAL LOW (ref 39.0–52.0)
Hemoglobin: 8.2 g/dL — ABNORMAL LOW (ref 13.0–17.0)
MCH: 27.9 pg (ref 26.0–34.0)
MCHC: 31.1 g/dL (ref 30.0–36.0)
MCV: 89.8 fL (ref 80.0–100.0)
Platelets: 205 10*3/uL (ref 150–400)
RBC: 2.94 MIL/uL — ABNORMAL LOW (ref 4.22–5.81)
RDW: 16.7 % — ABNORMAL HIGH (ref 11.5–15.5)
WBC: 10.4 10*3/uL (ref 4.0–10.5)
nRBC: 0 % (ref 0.0–0.2)

## 2021-04-25 MED ORDER — HYDROCODONE-ACETAMINOPHEN 5-325 MG PO TABS: 1.0000 | ORAL_TABLET | Freq: Four times a day (QID) | ORAL | 0 refills | Status: AC | PRN

## 2021-04-25 NOTE — Discharge Summary (Addendum)
Physician Discharge Summary    Patient ID: Richard Webb MRN: [id number] DOB/AGE: 11-04-1945  75 y.o.  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  Cleared by cardiology to proceed with surgery.  The patient underwent the surgery above.  Postoperatively, the patient gradually mobilized and advanced to a solid diet.  Pain and other symptoms were treated aggressively.    By the time of discharge, the patient was walking well the hallways, eating food, having flatus.  Pain was well-controlled on an oral medications.  He is urinating on his on his own with catheter out without any major issues.  Had fever night of postop day 1 but afebrile last 48 hours and feeling well.  Based on meeting discharge criteria and continuing to recover, I felt it was safe for the patient to be discharged from the hospital to further recover with close followup. Postoperative recommendations were discussed in detail.  They are written as well.  Questions answered.  He expressed understanding and appreciation.  Discharged Condition: good  Discharge Exam: Blood pressure 103/61, pulse 71, temperature 98.6 F (37 C), temperature source Oral, resp. rate 15, height 6'  (1.829 m), weight 88.8 kg, SpO2 95 %.  General: Pt awake/alert/oriented x4 in No acute distress Eyes: PERRL, normal EOM.  Sclera clear.  No icterus Neuro: CN II-XII intact w/o focal sensory/motor deficits. Lymph: No head/neck/groin lymphadenopathy Psych:  No delerium/psychosis/paranoia HENT: Normocephalic, Mucus membranes moist.  No thrush Neck: Supple, No tracheal deviation Chest: No chest wall pain w good excursion CV:  Pulses intact.  Regular rhythm MS: Normal AROM mjr joints.  No obvious deformity Abdomen: Soft.  Nondistended.  Nontender.  No evidence of peritonitis.  No incarcerated hernias. Ext:  SCDs BLE.  No mjr edema.  No cyanosis Skin: No petechiae / purpura   Disposition:    Follow-up Information     Leighton Ruff, MD. Schedule an appointment as soon as possible for a visit in 2 week(s).   Specialties: General Surgery, Colon and Rectal Surgery Contact information: [place] [address] [phone]                 Discharge disposition: 01-Home or Self Care       Discharge Instructions     Call MD for:   Complete by: As directed    FEVER > 101.5 F  (temperatures < 101.5 F are not significant)   Call MD for:  extreme fatigue   Complete by: As directed    Call MD for:  persistant dizziness or light-headedness   Complete by: As directed    Call MD for:  persistant nausea and vomiting   Complete by: As directed    Call MD for:  redness, tenderness, or signs of infection (pain, swelling, redness, odor or  green/yellow discharge around incision site)   Complete by: As directed    Call MD for:  severe uncontrolled pain   Complete by: As directed    Diet - low sodium heart healthy   Complete by: As directed    Start with a bland diet such as soups, liquids, starchy foods, low fat foods, etc. the first few days at home. Gradually advance to a solid, low-fat, high fiber diet by the end of the first week at home.   Add a fiber  supplement to your diet (Metamucil, etc) If you feel full, bloated, or constipated, stay on a full liquid or pureed/blenderized diet for a few days until you feel better and are no longer constipated.   Discharge instructions   Complete by: As directed    See Discharge Instructions If you are not getting better after two weeks or are noticing you are getting worse, contact our office [phone] for further advice.  We may need to adjust your medications, re-evaluate you in the office, send you to the emergency room, or see what other things we can do to help. The clinic staff is available to answer your questions during regular business hours (8:30am-5pm).  Please don't hesitate to call and ask to speak to one of our nurses for clinical concerns.    A surgeon from [facility] is always on call at the hospitals 24 hours/day If you have a medical emergency, go to the nearest emergency room or call 911.   Discharge wound care:   Complete by: As directed    It is good for closed incisions and even open wounds to be washed every day.  Shower every day.  Short baths are fine.  Wash the incisions and wounds clean with soap & water.    You may leave closed incisions open to air if it is dry.   You may cover the incision with clean gauze & replace it after your daily shower for comfort.   Driving Restrictions   Complete by: As directed    You may drive when: - you are no longer taking narcotic prescription pain medication - you can comfortably wear a seatbelt - you can safely make sudden turns/stops without pain.   Increase activity slowly   Complete by: As directed    Start light daily activities --- self-care, walking, climbing stairs- beginning the day after surgery.  Gradually increase activities as tolerated.  Control your pain to be active.  Stop when you are tired.  Ideally, walk several times a day, eventually an hour a day.   Most people are back to most day-to-day activities  in a few weeks.  It takes 4-6 weeks to get back to unrestricted, intense activity. If you can walk 30 minutes without difficulty, it is safe to try more intense activity such as jogging, treadmill, bicycling, low-impact aerobics, swimming, etc. Save the most intensive and strenuous activity for last (Usually 4-8 weeks after surgery) such as sit-ups, heavy lifting, contact sports, etc.  Refrain from any intense heavy lifting or straining until you are off narcotics for pain control.  You will have off days, but things should improve week-by-week. DO NOT PUSH THROUGH PAIN.  Let pain be your guide: If it hurts to do something, don't do it.   Lifting restrictions   Complete by: As directed    If you can walk 30 minutes without difficulty, it is safe to try more intense activity such as jogging, treadmill, bicycling, low-impact  aerobics, swimming, etc. Save the most intensive and strenuous activity for last (Usually 4-8 weeks after surgery) such as sit-ups, heavy lifting, contact sports, etc.   Refrain from any intense heavy lifting or straining until you are off narcotics for pain control.  You will have off days, but things should improve week-by-week. DO NOT PUSH THROUGH PAIN.  Let pain be your guide: If it hurts to do something, don't do it.  Pain is your body warning you to avoid that activity for another week until the pain goes down.   May shower / Bathe   Complete by: As directed    May walk up steps   Complete by: As directed    Sexual Activity Restrictions   Complete by: As directed    You may have sexual intercourse when it is comfortable. If it hurts to do something, stop.       Allergies as of 04/25/2021       Reactions   Bee Venom Anaphylaxis   Statins Other (See Comments)   Myalgias - Simvastatin and Rosuvastatin        Medication List     TAKE these medications    acetaminophen 500 MG tablet Commonly known as: TYLENOL Take 1,000 mg by mouth 2 (two) times daily as needed  for moderate pain.   amoxicillin 500 MG capsule Commonly known as: AMOXIL Take 500 mg by mouth as needed. Before dental work   apixaban 5 MG Tabs tablet Commonly known as: ELIQUIS Take 2 tablets twice daily until 3/7. On 3/8 start taking 1 tablet twice daily . What changed:  how much to take how to take this when to take this additional instructions   Breo Ellipta 200-25 MCG/INH Aepb Generic drug: fluticasone furoate-vilanterol Inhale 1 Inhaler into the lungs daily.   carbidopa-levodopa 10-100 MG tablet Commonly known as: SINEMET IR Take 1 tablet by mouth at bedtime.   clopidogrel 75 MG tablet Commonly known as: PLAVIX Take 1 tablet (75 mg total) by mouth daily.   cyanocobalamin 1000 MCG/ML injection Commonly known as: (VITAMIN B-12) Inject 1,000 mcg into the muscle every 30 (thirty) days.   Dupilumab 300 MG/2ML Sopn Inject 300 mg into the skin every 14 (fourteen) days.   famotidine 20 MG tablet Commonly known as: PEPCID Take 1 tablet (20 mg total) by mouth every evening.   finasteride 5 MG tablet Commonly known as: PROSCAR Take 1 tablet (5 mg total) by mouth daily.   folic acid 1 MG tablet Commonly known as: FOLVITE Take 1 mg by mouth See admin instructions. Mon - Fri   HYDROcodone-acetaminophen 5-325 MG tablet Commonly known as: NORCO/VICODIN Take 1 tablet by mouth every 6 (six) hours as needed for severe pain. What changed: Another medication with the same name was added. Make sure you understand how and when to take each.   HYDROcodone-acetaminophen 5-325 MG tablet Commonly known as: NORCO/VICODIN Take 1-2 tablets by mouth every 6 (six) hours as needed for severe pain. What changed: You were already taking a medication with the same name, and this prescription was added. Make sure you understand how and when to take each.   isosorbide mononitrate 30 MG 24 hr tablet Commonly known as: IMDUR TAKE 1 TABLET BY MOUTH  DAILY   losartan 50 MG tablet Commonly  known as: COZAAR TAKE 1 TABLET BY MOUTH  DAILY   metoprolol tartrate 25 MG tablet Commonly known as: LOPRESSOR TAKE 1 TABLET (25 MG TOTAL) BY MOUTH TWO TIMES DAILY.  montelukast 10 MG tablet Commonly known as: SINGULAIR Take 10 mg by mouth at bedtime.   nitroGLYCERIN 0.4 MG SL tablet Commonly known as: NITROSTAT Place 1 tablet (0.4 mg total) under the tongue every 5 (five) minutes x 3 doses as needed for chest pain (if no relief after 3rd dose, proceed to the ED for an evaluation or call 911).   omeprazole 40 MG capsule Commonly known as: PRILOSEC Take 40 mg by mouth daily.   oxymetazoline 0.05 % nasal spray Commonly known as: AFRIN Place 1 spray into both nostrils 2 (two) times daily as needed for congestion.   phenazopyridine 100 MG tablet Commonly known as: Pyridium Take 1 tablet (100 mg total) by mouth 2 (two) times daily as needed for pain.   Repatha SureClick 174 MG/ML Soaj Generic drug: Evolocumab INJECT CONTENTS OF 1 PEN UNDER THE SKIN EVERY 14 DAYS   sulfaSALAzine 500 MG tablet Commonly known as: AZULFIDINE Take 1,000 mg by mouth 2 (two) times daily.   temazepam 30 MG capsule Commonly known as: RESTORIL Take 30 mg by mouth at bedtime.   Vitamin D (Ergocalciferol) 1.25 MG (50000 UNIT) Caps capsule Commonly known as: DRISDOL Take 50,000 Units by mouth every 7 (seven) days. Thursdays               Discharge Care Instructions  (From admission, onward)           Start     Ordered   04/25/21 0000  Discharge wound care:       Comments: It is good for closed incisions and even open wounds to be washed every day.  Shower every day.  Short baths are fine.  Wash the incisions and wounds clean with soap & water.    You may leave closed incisions open to air if it is dry.   You may cover the incision with clean gauze & replace it after your daily shower for comfort.   04/25/21 0810            Significant Diagnostic Studies:  Results for orders  placed or performed during the hospital encounter of 04/22/21 (from the past 72 hour(s))  CBC     Status: Abnormal   Collection Time: 04/23/21  4:43 AM  Result Value Ref Range   WBC 10.9 (H) 4.0 - 10.5 K/uL   RBC 3.06 (L) 4.22 - 5.81 MIL/uL   Hemoglobin 8.5 (L) 13.0 - 17.0 g/dL   HCT 27.4 (L) 39.0 - 52.0 %   MCV 89.5 80.0 - 100.0 fL   MCH 27.8 26.0 - 34.0 pg   MCHC 31.0 30.0 - 36.0 g/dL   RDW 16.8 (H) 11.5 - 15.5 %   Platelets 215 150 - 400 K/uL   nRBC 0.0 0.0 - 0.2 %    Comment: Performed at [facility], [place] [address], [place], [place] [address]  Basic metabolic panel     Status: Abnormal   Collection Time: 04/23/21  4:43 AM  Result Value Ref Range   Sodium 135 135 - 145 mmol/L   Potassium 3.7 3.5 - 5.1 mmol/L   Chloride 107 98 - 111 mmol/L   CO2 25 22 - 32 mmol/L   Glucose, Bld 123 (H) 70 - 99 mg/dL    Comment: Glucose reference range applies only to samples taken after fasting for at least 8 hours.   BUN 12 8 - 23 mg/dL   Creatinine, Ser 0.90 0.61 - 1.24 mg/dL   Calcium 7.8 (L) 8.9 -  10.3 mg/dL   GFR, Estimated >60 >60 mL/min    Comment: (NOTE) Calculated using the CKD-EPI Creatinine Equation (2021)    Anion gap 3 (L) 5 - 15    Comment: Performed at [facility], [place] [address], [place], [place] [address]  CBC     Status: Abnormal   Collection Time: 04/24/21  4:05 AM  Result Value Ref Range   WBC 11.5 (H) 4.0 - 10.5 K/uL   RBC 3.07 (L) 4.22 - 5.81 MIL/uL   Hemoglobin 8.5 (L) 13.0 - 17.0 g/dL   HCT 27.9 (L) 39.0 - 52.0 %   MCV 90.9 80.0 - 100.0 fL   MCH 27.7 26.0 - 34.0 pg   MCHC 30.5 30.0 - 36.0 g/dL   RDW 16.8 (H) 11.5 - 15.5 %   Platelets 209 150 - 400 K/uL   nRBC 0.0 0.0 - 0.2 %    Comment: Performed at [facility], [place] [address] [place], [place] [address]  Basic metabolic panel     Status: Abnormal   Collection Time: 04/24/21  4:05 AM  Result Value Ref Range   Sodium 140 135 - 145 mmol/L    Potassium 3.6 3.5 - 5.1 mmol/L   Chloride 110 98 - 111 mmol/L   CO2 25 22 - 32 mmol/L   Glucose, Bld 119 (H) 70 - 99 mg/dL    Comment: Glucose reference range applies only to samples taken after fasting for at least 8 hours.   BUN 7 (L) 8 - 23 mg/dL   Creatinine, Ser 0.88 0.61 - 1.24 mg/dL   Calcium 8.4 (L) 8.9 - 10.3 mg/dL   GFR, Estimated >60 >60 mL/min    Comment: (NOTE) Calculated using the CKD-EPI Creatinine Equation (2021)    Anion gap 5 5 - 15    Comment: Performed at [facility], [place] [address] [place], [place] [address]  Magnesium     Status: None   Collection Time: 04/24/21  4:05 AM  Result Value Ref Range   Magnesium 2.0 1.7 - 2.4 mg/dL    Comment: Performed at [facility], [place] [address], [place], [place] [address]  CBC     Status: Abnormal   Collection Time: 04/25/21  3:52 AM  Result Value Ref Range   WBC 10.4 4.0 - 10.5 K/uL   RBC 2.94 (L) 4.22 - 5.81 MIL/uL   Hemoglobin 8.2 (L) 13.0 - 17.0 g/dL   HCT 26.4 (L) 39.0 - 52.0 %   MCV 89.8 80.0 - 100.0 fL   MCH 27.9 26.0 - 34.0 pg   MCHC 31.1 30.0 - 36.0 g/dL   RDW 16.7 (H) 11.5 - 15.5 %   Platelets 205 150 - 400 K/uL   nRBC 0.0 0.0 - 0.2 %    Comment: Performed at [facility], [place] [address], [place], [place] [address]  Basic metabolic panel     Status: Abnormal   Collection Time: 04/25/21  3:52 AM  Result Value Ref Range   Sodium 138 135 - 145 mmol/L   Potassium 3.4 (L) 3.5 - 5.1 mmol/L   Chloride 108 98 - 111 mmol/L   CO2 25 22 - 32 mmol/L   Glucose, Bld 115 (H) 70 - 99 mg/dL    Comment: Glucose reference range applies only to samples taken after fasting for at least 8 hours.   BUN 11 8 - 23 mg/dL   Creatinine, Ser 0.87 0.61 - 1.24 mg/dL  Calcium 8.2 (L) 8.9 - 10.3 mg/dL   GFR, Estimated >60 >60 mL/min    Comment: (NOTE) Calculated using the CKD-EPI Creatinine Equation (2021)    Anion gap 5 5 - 15    Comment: Performed at [facility], [place] [address], [place],  [address]    CT ABDOMEN PELVIS W CONTRAST  Result Date: 04/22/2021 CLINICAL DATA:  Colovesicular fistula EXAM: CT ABDOMEN AND PELVIS WITH CONTRAST TECHNIQUE: Multidetector CT imaging of the abdomen and pelvis was performed using the standard protocol following bolus administration of intravenous contrast. CONTRAST:  61m ISOVUE-370 IOPAMIDOL (ISOVUE-370) INJECTION 76% COMPARISON:  CT abdomen and pelvis dated November 01, 2020; CT abdomen and pelvis dated April 17, 2020 FINDINGS: Lower chest: Partially visualized lower lungs demonstrate reticular opacities and bilateral nodules, unchanged compared to 2021 prior exam and compatible with chronic sarcoidosis. Coronary artery calcifications and stents. No acute abnormality. Hepatobiliary: No focal liver abnormality is seen. Status post cholecystectomy. No biliary dilatation. Pancreas: Unremarkable. No pancreatic ductal dilatation or surrounding inflammatory changes. Spleen: Normal in size without focal abnormality. Adrenals/Urinary Tract: Bilateral adrenal glands are unremarkable. Kidneys enhance symmetrically with no evidence of hydronephrosis. Bilateral low-density renal lesions, largest are compatible with simple cysts, others are too small to completely characterize. Diffusely thick-walled urinary bladder with focal wall thickening of the bladder dome, a portion of thick-walled sigmoid colon adherent to this area. An air-filled cavity is seen in area of previous sigmoid abscess. Air is present in the bladder, suggesting persistent colovesicular fistula. Near-complete resolution of adjacent inflammatory changes when compared with most recent prior exam. Stomach/Bowel: Stomach is normal in appearance. Wall thickening of the sigmoid colon as described above. No evidence of obstruction. Vascular/Lymphatic: Aortic atherosclerosis. No enlarged abdominal or pelvic lymph nodes. Reproductive: Metallic densities are seen in the  prostate which are likely fiducial markers. Other: Small fat containing left inguinal hernia. Musculoskeletal: No acute or significant osseous findings. IMPRESSION: Sigmoid colon is adherent to the bladder dome. Air is seen in the bladder, suggesting persistent colovesicular fistula.Near-complete resolution of adjacent inflammatory changes when compared with most recent prior exam. Pulmonary nodules of the lung bases are unchanged in size compared to prior exam and likely sequela of sarcoidosis. Aortic Atherosclerosis (ICD10-I70.0). Electronically Signed   By: LYetta GlassmanM.D.   On: 04/22/2021 10:57    Past Medical History:  Diagnosis Date   [place]inal pain ([place]    Arthritis    Asthma    Coronary atherosclerosis of native coronary artery    a. DES x 3 RCA and DES mCx 10/11 - [place] - with residual 50-70% stenosis in the distal Cx in AV groove, 70% distal LAD beyond apex, 70% small D1. b. 12/2017: similar results with multivessel CAD. Patent stents. Medical management recommended.  c. s/p NSTEMI in 10/2020 with DES to prox-LAD   Diverticulitis    GERD (gastroesophageal reflux disease)    History of kidney stones    Hypertension    Mixed hyperlipidemia    Myocardial infarction ([place]    Obstructive sleep apnea    Pulmonary hypertension (HCC)    PASP 50 mmHg   Restless leg syndrome    Sarcoidosis    Statin intolerance    Ulcerative colitis ([place]     Past Surgical History:  Procedure Laterality Date   Ankle fracture repair Left 1985   APPENDECTOMY     CARDIAC CATHETERIZATION     stents x4   CHOLECYSTECTOMY  1970   CORONARY STENT INTERVENTION N/A 10/19/2020  Procedure: CORONARY STENT INTERVENTION;  Surgeon: Troy Sine, MD;  Location: [facility];  Service: Cardiovascular;  Laterality: N/A;   CYSTOSCOPY N/A 04/22/2021   Procedure: CYSTOSCOPY WITHE FIREFLY INJECTIONS;  Surgeon: Irine Seal, MD;  Location: [facility];  Service: Urology;  Laterality: N/A;   CYSTOSCOPY WITH  INSERTION OF UROLIFT N/A 08/27/2017   Procedure: CYSTOSCOPY WITH INSERTION OF UROLIFT;  Surgeon: Cleon Gustin, MD;  Location: [facility];  Service: Urology;  Laterality: N/A;   JOINT REPLACEMENT     LAPAROSCOPIC APPENDECTOMY  2013   LEFT HEART CATH AND CORONARY ANGIOGRAPHY N/A 01/02/2018   Procedure: LEFT HEART CATH AND CORONARY ANGIOGRAPHY;  Surgeon: Troy Sine, MD;  Location: [facility];  Service: Cardiovascular;  Laterality: N/A;   LEFT HEART CATH AND CORONARY ANGIOGRAPHY N/A 10/19/2020   Procedure: LEFT HEART CATH AND CORONARY ANGIOGRAPHY;  Surgeon: Troy Sine, MD;  Location: [facility];  Service: Cardiovascular;  Laterality: N/A;   Right total knee replacement Bilateral 2005    Social History   Socioeconomic History   Marital status: Married    Spouse name: Not on file   Number of children: Not on file   Years of education: Not on file   Highest education level: Not on file  Occupational History   Occupation: Theme park manager  Tobacco Use   Smoking status: Never   Smokeless tobacco: Never  Vaping Use   Vaping Use: Never used  Substance and Sexual Activity   Alcohol use: No   Drug use: No   Sexual activity: Yes    Birth control/protection: None  Other Topics Concern   Not on file  Social History Narrative   Not on file   Social Determinants of Health   Financial Resource Strain: Not on file  Food Insecurity: Not on file  Transportation Needs: Not on file  Physical Activity: Not on file  Stress: Not on file  Social Connections: Not on file  Intimate Partner Violence: Not on file    Family History  Problem Relation Age of Onset   Colon cancer Mother    CAD Father     Current Facility-Administered Medications  Medication Dose Route Frequency Provider Last Rate Last Admin   0.9 %  sodium chloride infusion  250 mL Intravenous PRN Michael Boston, MD       acetaminophen (TYLENOL) tablet 500 mg  500 mg Oral Lajuana Ripple, MD   500 mg at  04/25/21 0645   acetaminophen (TYLENOL) tablet 500 mg  500 mg Oral Q6H PRN Michael Boston, MD       alum & mag hydroxide-simeth (MAALOX/MYLANTA) 200-200-20 MG/5ML suspension 30 mL  30 mL Oral X4V PRN Leighton Ruff, MD       apixaban Arne Cleveland) tablet 5 mg  5 mg Oral BID Michael Boston, MD   5 mg at 04/24/21 2119   carbidopa-levodopa (SINEMET IR) 10-100 MG per tablet immediate release 1 tablet  1 tablet Oral QHS Leighton Ruff, MD   1 tablet at 04/24/21 2119   cefoTEtan (CEFOTAN) 2 g in sodium chloride 0.9 % 100 mL IVPB  2 g Intravenous Luz Lex, MD 200 mL/hr at 04/25/21 0644 2 g at 04/25/21 0644   diphenhydrAMINE (BENADRYL) 12.5 MG/5ML elixir 12.5 mg  12.5 mg Oral O5F PRN Leighton Ruff, MD       Or   diphenhydrAMINE (BENADRYL) injection 12.5 mg  12.5 mg Intravenous Y9W PRN Leighton Ruff, MD  famotidine (PEPCID) tablet 20 mg  20 mg Oral QPM Leighton Ruff, MD   20 mg at 04/24/21 1745   feeding supplement (ENSURE SURGERY) liquid 237 mL  237 mL Oral BID BM Leighton Ruff, MD   001 mL at 04/24/21 1019   finasteride (PROSCAR) tablet 5 mg  5 mg Oral Daily Leighton Ruff, MD   5 mg at 74/94/49 1017   fluticasone furoate-vilanterol (BREO ELLIPTA) 200-25 MCG/INH 1 puff  1 puff Inhalation Daily Leighton Ruff, MD   1 puff at 04/25/21 0752   gabapentin (NEURONTIN) capsule 300 mg  300 mg Oral BID Leighton Ruff, MD   675 mg at 04/24/21 2119   HYDROcodone-acetaminophen (NORCO/VICODIN) 5-325 MG per tablet 1-2 tablet  1-2 tablet Oral F1M PRN Leighton Ruff, MD   2 tablet at 04/24/21 2300   HYDROmorphone (DILAUDID) injection 0.5 mg  0.5 mg Intravenous B8G PRN Leighton Ruff, MD   0.5 mg at 04/23/21 2248   isosorbide mononitrate (IMDUR) 24 hr tablet 30 mg  30 mg Oral Daily Leighton Ruff, MD   30 mg at 04/24/21 1018   lactated ringers bolus 1,000 mL  1,000 mL Intravenous Q8H PRN Michael Boston, MD       losartan (COZAAR) tablet 50 mg  50 mg Oral Daily Leighton Ruff, MD   50 mg at 04/24/21 1018    metoprolol tartrate (LOPRESSOR) tablet 25 mg  25 mg Oral BID Michael Boston, MD   25 mg at 04/24/21 2119   montelukast (SINGULAIR) tablet 10 mg  10 mg Oral QHS Leighton Ruff, MD   10 mg at 04/24/21 2119   nitroGLYCERIN (NITROSTAT) SL tablet 0.4 mg  0.4 mg Sublingual Q5 Min x 3 PRN Leighton Ruff, MD       ondansetron [facility]) tablet 4 mg  4 mg Oral Y6Z PRN Leighton Ruff, MD       Or   ondansetron [facility]) injection 4 mg  4 mg Intravenous L9J PRN Leighton Ruff, MD       pantoprazole (PROTONIX) EC tablet 80 mg  80 mg Oral Daily Leighton Ruff, MD   80 mg at 04/24/21 1018   saccharomyces boulardii (FLORASTOR) capsule 250 mg  250 mg Oral BID Leighton Ruff, MD   570 mg at 04/24/21 2119   simethicone (MYLICON) chewable tablet 40 mg  40 mg Oral V7B PRN Leighton Ruff, MD       sodium chloride flush (NS) 0.9 % injection 3 mL  3 mL Intravenous Gorden Harms, MD   3 mL at 04/24/21 2121   sodium chloride flush (NS) 0.9 % injection 3 mL  3 mL Intravenous PRN Michael Boston, MD       temazepam (RESTORIL) capsule 30 mg  30 mg Oral QHS Leighton Ruff, MD   30 mg at 04/24/21 2119     Allergies  Allergen Reactions   Bee Venom Anaphylaxis   Statins Other (See Comments)    Myalgias - Simvastatin and Rosuvastatin    Signed: Morton Peters, MD, FACS, MASCRS Esophageal, Gastrointestinal & Colorectal Surgery Robotic and Minimally Invasive Surgery  [facility], [place], [place], [place] [address] [phone] Fax [phone] Main  CONTACT INFORMATION:  Weekday (9AM-5PM): Call [facility] main office at [phone]  Weeknight (5PM-9AM) or Weekend/Holiday: Check [email or web address] (password " [email or web address]") for General Surgery [facility] coverage  (Please, do not use SecureChat as it is not reliable communication to operating  surgeons for immediate patient care)      04/25/2021, 8:12 AM

## 2021-04-25 NOTE — Progress Notes (Signed)
Provided discharge education/instructions , all questions and concerns addressed, Pt not in acute distress, incision/ports clean/dry/intact. Pt to discharge home with belongings accompanied by son.

## 2021-04-25 NOTE — Plan of Care (Signed)

## 2021-04-27 LAB — SURGICAL PATHOLOGY

## 2021-05-06 ENCOUNTER — Ambulatory Visit: Payer: Medicare Other | Admitting: Urology

## 2021-05-09 ENCOUNTER — Telehealth: Payer: Self-pay | Admitting: Cardiology

## 2021-05-09 ENCOUNTER — Other Ambulatory Visit: Payer: Self-pay | Admitting: Cardiology

## 2021-05-09 NOTE — Telephone Encounter (Signed)
Drug in question is Eliquis, not plavix   I spoke with pharmacy, he paid $140 this month, he has been taking since march 2022   He needs to pay out of the donut hole $554 before his insurance will offer him his previous rate.   We can offer 1 week sample. I asked him to return call.

## 2021-05-09 NOTE — Telephone Encounter (Signed)
Eliquis is too expensive for him can you put him on something or give him a coupon card   *STAT* If patient is at the pharmacy, call can be transferred to refill team.   1. Which medications need to be refilled? (please list name of each medication and dose if known)  plavix  2. Which pharmacy/location (including street and city if local pharmacy) is medication to be sent to? [facility] [address]   3. Do they need a 30 day or 90 day supply? [place]

## 2021-05-10 DIAGNOSIS — Z299 Encounter for prophylactic measures, unspecified: Secondary | ICD-10-CM | POA: Diagnosis not present

## 2021-05-10 DIAGNOSIS — E7800 Pure hypercholesterolemia, unspecified: Secondary | ICD-10-CM | POA: Diagnosis not present

## 2021-05-10 DIAGNOSIS — I10 Essential (primary) hypertension: Secondary | ICD-10-CM | POA: Diagnosis not present

## 2021-05-10 DIAGNOSIS — R197 Diarrhea, unspecified: Secondary | ICD-10-CM | POA: Diagnosis not present

## 2021-05-10 DIAGNOSIS — Z23 Encounter for immunization: Secondary | ICD-10-CM | POA: Diagnosis not present

## 2021-05-10 DIAGNOSIS — K7460 Unspecified cirrhosis of liver: Secondary | ICD-10-CM | POA: Diagnosis not present

## 2021-05-11 DIAGNOSIS — R197 Diarrhea, unspecified: Secondary | ICD-10-CM | POA: Diagnosis not present

## 2021-05-16 ENCOUNTER — Encounter: Payer: Self-pay | Admitting: Urology

## 2021-05-16 ENCOUNTER — Other Ambulatory Visit: Payer: Self-pay

## 2021-05-16 ENCOUNTER — Ambulatory Visit (INDEPENDENT_AMBULATORY_CARE_PROVIDER_SITE_OTHER): Payer: Medicare Other | Admitting: Urology

## 2021-05-16 VITALS — BP 133/76 | HR 52

## 2021-05-16 DIAGNOSIS — N400 Enlarged prostate without lower urinary tract symptoms: Secondary | ICD-10-CM

## 2021-05-16 DIAGNOSIS — I200 Unstable angina: Secondary | ICD-10-CM | POA: Diagnosis not present

## 2021-05-16 DIAGNOSIS — N3281 Overactive bladder: Secondary | ICD-10-CM

## 2021-05-16 DIAGNOSIS — R310 Gross hematuria: Secondary | ICD-10-CM

## 2021-05-16 LAB — URINALYSIS, ROUTINE W REFLEX MICROSCOPIC
Bilirubin, UA: NEGATIVE
Glucose, UA: NEGATIVE
Ketones, UA: NEGATIVE
Leukocytes,UA: NEGATIVE
Nitrite, UA: NEGATIVE
Protein,UA: NEGATIVE
Specific Gravity, UA: 1.01 (ref 1.005–1.030)
Urobilinogen, Ur: 0.2 mg/dL (ref 0.2–1.0)
pH, UA: 6.5 (ref 5.0–7.5)

## 2021-05-16 LAB — MICROSCOPIC EXAMINATION
Bacteria, UA: NONE SEEN
Epithelial Cells (non renal): NONE SEEN /hpf (ref 0–10)
Renal Epithel, UA: NONE SEEN /hpf
WBC, UA: NONE SEEN /hpf (ref 0–5)

## 2021-05-16 LAB — BLADDER SCAN AMB NON-IMAGING: Scan Result: 22

## 2021-05-16 MED ORDER — SULFAMETHOXAZOLE-TRIMETHOPRIM 800-160 MG PO TABS: 1.0000 | ORAL_TABLET | Freq: Two times a day (BID) | ORAL | 0 refills | Status: DC

## 2021-05-16 NOTE — Progress Notes (Signed)
post void residual=22  Urological Symptom Review  Patient is experiencing the following symptoms: Frequent urination Burning/pain with urination Stream starts and stops Blood in urine   Review of Systems  Gastrointestinal (upper)  : Negative for upper GI symptoms  Gastrointestinal (lower) : Negative for lower GI symptoms  Constitutional : Negative for symptoms  Skin: Negative for skin symptoms  Eyes: Negative for eye symptoms  Ear/Nose/Throat : Negative for Ear/Nose/Throat symptoms  Hematologic/Lymphatic: Negative for Hematologic/Lymphatic symptoms  Cardiovascular : Negative for cardiovascular symptoms  Respiratory : Negative for respiratory symptoms  Endocrine: Negative for endocrine symptoms  Musculoskeletal: Negative for musculoskeletal symptoms  Neurological: Negative for neurological symptoms  Psychologic: Negative for psychiatric symptoms

## 2021-05-16 NOTE — Patient Instructions (Signed)

## 2021-05-16 NOTE — Progress Notes (Signed)
05/16/2021 10:40 AM   [name] 12/15/1945 [id number]  Referring provider: [name], MD [address] [place],  [place] [address]  Gross hematuria and hematospermia   HPI: Mr Richard Webb is a 75yo here for followup for BPH. He underwent colovesical fistula repair 9/2.  Since the surgery he is having dysuria with pain referred to the glans with urination. He has been having hematospermia for the past 4-6 weeks. No worsening LUTS. IPSS 10 QOL 2. No other complaints today   PMH: Past Medical History:  Diagnosis Date   Anginal pain ([place])    Arthritis    Asthma    Coronary atherosclerosis of native coronary artery    a. DES x 3 RCA and DES mCx 10/11 - [place] - with residual 50-70% stenosis in the distal Cx in AV groove, 70% distal LAD beyond apex, 70% small D1. b. 12/2017: similar results with multivessel CAD. Patent stents. Medical management recommended.  c. s/p NSTEMI in 10/2020 with DES to prox-LAD   Diverticulitis    GERD (gastroesophageal reflux disease)    History of kidney stones    Hypertension    Mixed hyperlipidemia    Myocardial infarction ([place])    Obstructive sleep apnea    Pulmonary hypertension ([facility])    PASP 50 mmHg   Restless leg syndrome    Sarcoidosis    Statin intolerance    Ulcerative colitis ([place])     Surgical History: Past Surgical History:  Procedure Laterality Date   Ankle fracture repair Left 1985   APPENDECTOMY     CARDIAC CATHETERIZATION     stents x4   CHOLECYSTECTOMY  1970   CORONARY STENT INTERVENTION N/A 10/19/2020   Procedure: CORONARY STENT INTERVENTION;  Surgeon: Troy Sine, MD;  Location: [facility];  Service: Cardiovascular;  Laterality: N/A;   CYSTOSCOPY N/A 04/22/2021   Procedure: CYSTOSCOPY WITHE FIREFLY INJECTIONS;  Surgeon: Irine Seal, MD;  Location: [facility];  Service: Urology;  Laterality: N/A;   CYSTOSCOPY WITH INSERTION OF UROLIFT N/A 08/27/2017   Procedure: CYSTOSCOPY WITH INSERTION OF UROLIFT;  Surgeon:  Cleon Gustin, MD;  Location: [facility];  Service: Urology;  Laterality: N/A;   JOINT REPLACEMENT     LAPAROSCOPIC APPENDECTOMY  2013   LEFT HEART CATH AND CORONARY ANGIOGRAPHY N/A 01/02/2018   Procedure: LEFT HEART CATH AND CORONARY ANGIOGRAPHY;  Surgeon: Troy Sine, MD;  Location: [facility];  Service: Cardiovascular;  Laterality: N/A;   LEFT HEART CATH AND CORONARY ANGIOGRAPHY N/A 10/19/2020   Procedure: LEFT HEART CATH AND CORONARY ANGIOGRAPHY;  Surgeon: Troy Sine, MD;  Location: [facility];  Service: Cardiovascular;  Laterality: N/A;   Right total knee replacement Bilateral 2005    Home Medications:  Allergies as of 05/16/2021       Reactions   Bee Venom Anaphylaxis   Statins Other (See Comments)   Myalgias - Simvastatin and Rosuvastatin        Medication List        Accurate as of May 16, 2021 10:40 AM. If you have any questions, ask your nurse or doctor.          STOP taking these medications    phenazopyridine 100 MG tablet Commonly known as: Pyridium Stopped by: Nicolette Bang, MD   sulfaSALAzine 500 MG tablet Commonly known as: AZULFIDINE Stopped by: Nicolette Bang, MD       TAKE these medications    acetaminophen 500 MG tablet Commonly known as: TYLENOL  Take 1,000 mg by mouth 2 (two) times daily as needed for moderate pain.   amoxicillin 500 MG capsule Commonly known as: AMOXIL Take 500 mg by mouth as needed. Before dental work   apixaban 5 MG Tabs tablet Commonly known as: ELIQUIS Take 2 tablets twice daily until 3/7. On 3/8 start taking 1 tablet twice daily . What changed:  how much to take how to take this when to take this additional instructions   Breo Ellipta 200-25 MCG/INH Aepb Generic drug: fluticasone furoate-vilanterol Inhale 1 Inhaler into the lungs daily.   carbidopa-levodopa 10-100 MG tablet Commonly known as: SINEMET IR Take 1 tablet by mouth at bedtime.   clopidogrel 75 MG  tablet Commonly known as: PLAVIX TAKE 1 TABLET BY MOUTH EVERY DAY   cyanocobalamin 1000 MCG/ML injection Commonly known as: (VITAMIN B-12) Inject 1,000 mcg into the muscle every 30 (thirty) days.   Dupilumab 300 MG/2ML Sopn Inject 300 mg into the skin every 14 (fourteen) days.   famotidine 20 MG tablet Commonly known as: PEPCID Take 1 tablet (20 mg total) by mouth every evening.   finasteride 5 MG tablet Commonly known as: PROSCAR Take 1 tablet (5 mg total) by mouth daily.   folic acid 1 MG tablet Commonly known as: FOLVITE Take 1 mg by mouth See admin instructions. Mon - Fri   HYDROcodone-acetaminophen 5-325 MG tablet Commonly known as: NORCO/VICODIN Take 1 tablet by mouth every 6 (six) hours as needed for severe pain.   HYDROcodone-acetaminophen 5-325 MG tablet Commonly known as: NORCO/VICODIN Take 1-2 tablets by mouth every 6 (six) hours as needed for severe pain.   isosorbide mononitrate 30 MG 24 hr tablet Commonly known as: IMDUR TAKE 1 TABLET BY MOUTH  DAILY   losartan 50 MG tablet Commonly known as: COZAAR TAKE 1 TABLET BY MOUTH  DAILY What changed:  how much to take how to take this when to take this   metoprolol tartrate 25 MG tablet Commonly known as: LOPRESSOR TAKE 1 TABLET (25 MG TOTAL) BY MOUTH TWO TIMES DAILY.   montelukast 10 MG tablet Commonly known as: SINGULAIR Take 10 mg by mouth at bedtime.   nitroGLYCERIN 0.4 MG SL tablet Commonly known as: NITROSTAT Place 1 tablet (0.4 mg total) under the tongue every 5 (five) minutes x 3 doses as needed for chest pain (if no relief after 3rd dose, proceed to the ED for an evaluation or call 911).   omeprazole 40 MG capsule Commonly known as: PRILOSEC Take 40 mg by mouth daily.   oxymetazoline 0.05 % nasal spray Commonly known as: AFRIN Place 1 spray into both nostrils 2 (two) times daily as needed for congestion.   Repatha SureClick 497 MG/ML Soaj Generic drug: Evolocumab INJECT CONTENTS OF 1 PEN  UNDER THE SKIN EVERY 14 DAYS   temazepam 30 MG capsule Commonly known as: RESTORIL Take 30 mg by mouth at bedtime.   Vitamin D (Ergocalciferol) 1.25 MG (50000 UNIT) Caps capsule Commonly known as: DRISDOL Take 50,000 Units by mouth every 7 (seven) days. Thursdays        Allergies:  Allergies  Allergen Reactions   Bee Venom Anaphylaxis   Statins Other (See Comments)    Myalgias - Simvastatin and Rosuvastatin    Family History: Family History  Problem Relation Age of Onset   Colon cancer Mother    CAD Father     Social History:  reports that he has never smoked. He has never used smokeless tobacco. He reports that he  does not drink alcohol and does not use drugs.  ROS: All other review of systems were reviewed and are negative except what is noted above in HPI  Physical Exam: BP 133/76   Pulse (!) 52   Constitutional:  Alert and oriented, No acute distress. HEENT: [place] AT, moist mucus membranes.  Trachea midline, no masses. Cardiovascular: No clubbing, cyanosis, or edema. Respiratory: Normal respiratory effort, no increased work of breathing. GI: Abdomen is soft, nontender, nondistended, no abdominal masses GU: No CVA tenderness.  Lymph: No cervical or inguinal lymphadenopathy. Skin: No rashes, bruises or suspicious lesions. Neurologic: Grossly intact, no focal deficits, moving all 4 extremities. Psychiatric: Normal mood and affect.  Laboratory Data: Lab Results  Component Value Date   WBC 10.4 04/25/2021   HGB 8.2 (L) 04/25/2021   HCT 26.4 (L) 04/25/2021   MCV 89.8 04/25/2021   PLT 205 04/25/2021    Lab Results  Component Value Date   CREATININE 0.87 04/25/2021    No results found for: PSA  No results found for: TESTOSTERONE  Lab Results  Component Value Date   HGBA1C 4.9 04/11/2021    Urinalysis    Component Value Date/Time   COLORURINE AMBER (A) 01/24/2021 0957   APPEARANCEUR Cloudy (A) 02/02/2021 1547   LABSPEC 1.004 (L) 01/24/2021 0957    PHURINE 7.0 01/24/2021 0957   GLUCOSEU CANCELED 02/02/2021 1547   HGBUR LARGE (A) 01/24/2021 0957   BILIRUBINUR NEGATIVE 01/24/2021 0957   BILIRUBINUR Negative 09/22/2020 1718   KETONESUR NEGATIVE 01/24/2021 0957   PROTEINUR CANCELED 02/02/2021 1547   PROTEINUR NEGATIVE 01/24/2021 0957   NITRITE NEGATIVE 01/24/2021 0957   LEUKOCYTESUR LARGE (A) 01/24/2021 0957    Lab Results  Component Value Date   LABMICR See below: 02/02/2021   WBCUA 11-30 (A) 02/02/2021   LABEPIT 0-10 02/02/2021   BACTERIA Moderate (A) 02/02/2021    Pertinent Imaging:  No results found for this or any previous visit.  No results found for this or any previous visit.  No results found for this or any previous visit.  No results found for this or any previous visit.  No results found for this or any previous visit.  No results found for this or any previous visit.  Results for orders placed during the hospital encounter of 04/09/20  CT HEMATURIA WORKUP  Narrative CLINICAL DATA:  Gross hematuria with lower abdominal and groin pain  EXAM: CT ABDOMEN AND PELVIS WITHOUT AND WITH CONTRAST  TECHNIQUE: Multidetector CT imaging of the abdomen and pelvis was performed following the standard protocol before and following the bolus administration of intravenous contrast.  CONTRAST:  186m OMNIPAQUE IOHEXOL 300 MG/ML  SOLN  COMPARISON:  Abdomen and pelvis CT from 2017  FINDINGS: Lower chest: 9 x 7 mm part solid pulmonary nodule (image 12, series 4) septal thickening at the lung bases as well with some subpleural reticulation. Concern in the LEFT lung base on the prior exam measuring approximately 5-6 mm.  Mild septal thickening also present at the RIGHT lung base with parenchymal scarring as slightly worse than the previous exam.  On prone imaging there is persistent subpleural reticulation and there is some nodularity that is more evident (image 41, series 15) 5 mm juxta diaphragmatic pulmonary  nodule. No consolidation or sign of pleural effusion. Multiple small nodules are seen at the RIGHT lung base as well, for instance on image 14 of series 15 there are at least 5/6 additional small nodules, a 4 mm nodule in the RIGHT lung  base serving as an example of multiple small nodules.  Hepatobiliary: Mildly nodular hepatic contours with fissural widening. Portal vein is patent liver contours are unchanged since 2017. No focal, suspicious hepatic lesion. Post cholecystectomy without gross biliary duct distension.  Pancreas: Pancreas without focal lesion or ductal dilation. No signs of peripancreatic inflammation.  Spleen: Spleen normal in size and contour with no focal lesion. Small adjacent splenule.  Perisplenic collaterals in the LEFT hemiabdomen. Splenic vein remains patent as does the SMV.  Adrenals/Urinary Tract: Adrenal glands are normal.  Bilateral renal cysts, largest on the RIGHT approximately 4.5 x 3.4 cm. No hydronephrosis. No ureteral calculus.  Small lower pole calculus on the LEFT. This measures approximately 3 mm.  Mild perivesical stranding. Bladder wall thickening with adjacent diverticular inflammation  Large inflamed diverticulum versus small pericolonic abscess from diverticulitis sits atop the urinary bladder. (Image 71 series 7) and image 52, series 11) there is colonic thickening as well in this area.  No sign of upper tract lesion.  Stomach/Bowel: Small hiatal hernia. No signs of small bowel obstruction or acute small bowel process. Post appendectomy.  Segmental colonic thickening more pronounced adjacent to pericolonic fluid and gas that abuts the urinary bladder and tracks along the dome of the bladder. This area measures approximately 2.3 x 1.9 cm. No gas in the urinary bladder at this time.  Extending superiorly from the sigmoid colon is a gas containing tract best seen on image 71 of series 12. There is a diverticulum in this area on  the previous study but this appears more pronounced and is associated with fluid density that extends superiorly from the colon, adjacent to a wide-mouth diverticulum. There is no pericolonic inflammation in this location. The area extending from the gas containing tract abuts small bowel loops measuring approximately 2.1 x 1.4 cm.  Vascular/Lymphatic: Calcified atheromatous plaque in the abdominal aorta, no aneurysm. No adenopathy in the retroperitoneum. Small lymph nodes in the upper abdomen are unchanged.  No pelvic lymphadenopathy.  Reproductive: Post uro lift procedure.  Post RIGHT orchiectomy.  Other: No free air.  No ascites.  Musculoskeletal: No acute musculoskeletal process. No destructive bone finding. Spinal degenerative changes.  IMPRESSION: 1. Pericolonic abscess likely related to diverticulitis between the sigmoid and the urinary bladder with thickening of the wall of the urinary bladder, likely secondary cystitis. No current signs of colovesical fistula though with this appearance fistula formation is a risk. 2. Eccentric thickening of the inferior wall of the colon is noted, potentially related to more pronounced inflammation in this location would however suggest follow-up colonoscopy to exclude underlying colonic lesion. 3. Small gas containing tract extending superiorly from the sigmoid slightly downstream from this area of abnormality was present previously but appears slightly larger than on the prior study. Significance uncertain. Potentially related to prior diverticulitis. 4. No free air or ascites. 5. No signs of upper tract lesion with renal cortical scarring, nephrolithiasis and renal cysts. 6. 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 Correlate with any clinical or laboratory evidence of liver disease. 7. Multiple small pulmonary nodules at the lung bases with septal thickening and subpleural reticulation. Correlate  with any risk factors for aspiration or history of chronic infection. Given the 8 mm mean diameter pulmonary nodule which has enlarged since prior imaging consider dedicated chest CT and/or comparison with prior evaluations. Based on spiculated appearance scarring or bronchogenic neoplasm could have this appearance. 8. Aortic atherosclerosis.  Aortic Atherosclerosis (ICD10-I70.0).   Electronically Signed By: Cay Schillings  Wile M.D. On: 04/10/2020 16:57  No results found for this or any previous visit.   Assessment & Plan:    1. Benign prostatic hyperplasia, unspecified whether lower urinary tract symptoms present -Continue finasterid - Urinalysis, Routine w reflex microscopic  2. OAB (overactive bladder) -continue finasteride - BLADDER SCAN AMB NON-IMAGING  3. Gross hematuria Likely related to bladder repair from his colovesical fistula   No follow-ups on file.  Nicolette Bang, MD  [facility] [place]

## 2021-05-20 DIAGNOSIS — K449 Diaphragmatic hernia without obstruction or gangrene: Secondary | ICD-10-CM | POA: Diagnosis not present

## 2021-05-20 DIAGNOSIS — I10 Essential (primary) hypertension: Secondary | ICD-10-CM | POA: Diagnosis not present

## 2021-05-23 DIAGNOSIS — K5190 Ulcerative colitis, unspecified, without complications: Secondary | ICD-10-CM | POA: Diagnosis not present

## 2021-05-23 DIAGNOSIS — K263 Acute duodenal ulcer without hemorrhage or perforation: Secondary | ICD-10-CM | POA: Diagnosis not present

## 2021-05-30 ENCOUNTER — Telehealth: Payer: Self-pay | Admitting: *Deleted

## 2021-05-30 NOTE — Telephone Encounter (Signed)
Need more info such as how many teeth extracted and anesthesia. Requesting office phone number is also missing

## 2021-05-30 NOTE — Telephone Encounter (Signed)
   [facility] Pre-operative Risk Assessment    Patient Name: Richard Webb  DOB: 13-Aug-1946 MRN: [id number]  HEARTCARE STAFF:  - IMPORTANT!!!!!! Under Visit Info/Reason for Call, type in Other and utilize the format Clearance MM/DD/YY or Clearance TBD. Do not use dashes or single digits. - Please review there is not already an duplicate clearance open for this procedure. - If request is for dental extraction, please clarify the # of teeth to be extracted. - If the patient is currently at the dentist's office, call Pre-Op Callback Staff (MA/nurse) to input urgent request.  - If the patient is not currently in the dentist office, please route to the Pre-Op pool.  Request for surgical clearance:  What type of surgery is being performed? Extractions  When is this surgery scheduled? TBD  What type of clearance is required (medical clearance vs. Pharmacy clearance to hold med vs. Both)? Pharmacy  Are there any medications that need to be held prior to surgery and how long? Eliquis, plavix  Practice name and name of physician performing surgery? [facility]/Dr. Eliott Nine III  What is the office phone number?   7.   What is the office fax number? [phone]  8.   Anesthesia type (None, local, MAC, general) ?    [name] 05/30/2021, 9:43 AM  _________________________________________________________________   (provider comments below)

## 2021-05-30 NOTE — Telephone Encounter (Signed)
I Googled the dentist name and found a ph# [phone]. I left a message with the hopes of finding the right office. I left message that we will require further information in order to complete the clearance request that is being asked by cardiology. I see clearance states extractions, will need to know how many teeth are being extracted, what type of anesthesia is being used if any.

## 2021-05-31 NOTE — Telephone Encounter (Signed)
   Patient Name: Richard Webb  DOB: 1946/02/11 MRN: [id number]  Primary Cardiologist: Rozann Lesches, MD  Chart reviewed as part of pre-operative protocol coverage.   Simple dental extractions are considered low risk procedures per guidelines and generally do not require any specific cardiac clearance. It is also generally accepted that for simple extractions and dental cleanings, there is no need to interrupt blood thinner therapy.   SBE prophylaxis is not required for the patient from a cardiac standpoint.  I will route this recommendation to the requesting party via Epic fax function and remove from pre-op pool.  Please call with questions.  Almyra Deforest, [place] 05/31/2021, 4:37 PM

## 2021-05-31 NOTE — Telephone Encounter (Signed)
Received a phone call from the requesting office.  They are aware that for simple (1-2 teeth) dental extraction, the current plan is to continue through the procedure on the Eliquis and Plavix.  They are concerned that patient has another teeth that they plan to address in the near future.  There is a question that teeth may require more extensive surgical extraction which carries with it more bleeding risk.  Therefore, they are asking cardiology service for guidance in regard to hold Eliquis and Plavix if surgical extraction is required in the near future.   Patient is on Eliquis due to diagnosis of PE in February 2022.  He has a history of CAD with last NSTEMI in March 2022.  He had recurrent admission in late March due to sigmoid diverticulitis with abscess.  Medical management was pursued given the recent stent placement and recommendation by interventional cardiology service that he would require bridging with IV Aggrastat if he has to stop the Plavix at the time and will do require bridging with Lovenox due to the recent PE.  He is 6 months out from the last PE and PCI. Will forward to our clinical pharmacist to review Eliquis and forward to Dr. Domenic Polite to review plavix  Please forward your reply to [facility] PREOP

## 2021-05-31 NOTE — Telephone Encounter (Signed)
I s/w dental office and confirmed the pt will have 1 tooth extracted at this time under local anesthesia. I confirmed the fax # as well. Will update the pre op provider for review.

## 2021-06-01 NOTE — Telephone Encounter (Signed)
Recommend separating dental work to 1 tooth at a time so that he can continue on Eliquis given PE earlier this year. If they extract > 2 teeth on the same day, ok to hold Eliquis for 1 day prior and resume as soon as safely possible, would not need bridging with short anticoag interruption (he is also outside 6 month tx window).

## 2021-06-01 NOTE — Telephone Encounter (Signed)
Per Dr. Domenic Polite "Please refer back to preoperative medication discussions in the chart from August prior to his last surgery.  If it is absolutely necessary that he come off anticoagulation and antiplatelet therapy for what is being described as potentially extensive dental work with high bleeding risk, that he could hold Plavix for 5 days prior.  I would anticipate pharmacy input on his Eliquis to be at least 48 hours hold prior."   Pharmacy reviewed the case as well and recommend 1 day hold of Eliquis would be sufficient if bleeding risk is too high.   Of course, we typically recommend continue both plavix and Eliquis through the procedure if only 1 tooth is extracted. Only hold the blood thinners when surgical removal is required or >2 teeth are removed at the same time and bleeding is a concern.

## 2021-06-07 ENCOUNTER — Encounter: Payer: Self-pay | Admitting: Cardiology

## 2021-06-07 NOTE — Progress Notes (Signed)
Cardiology Office Note  Date: 06/08/2021   ID: Richard Webb, DOB Jun 10, 1946, MRN [id number]  PCP:  [name], MD  Cardiologist:  Rozann Lesches, MD Electrophysiologist:  None   Chief Complaint  Patient presents with   Cardiac follow-up     History of Present Illness: Richard Webb is a 75 y.o. male last seen in June by Ms. Strader PA-C.  He is here today for a follow-up visit.  He does not report any active angina symptoms or sublingual nitroglycerin use on current regimen.  He did undergo successful robot-assisted laparoscopic partial colectomy with liver biopsy and cystoscopy in September without obvious perioperative cardiac complications.  He anticipates dental surgery with extractions in the near future.  Today we went over his medications in detail.  We discussed stopping Eliquis after he completes the present bottle.  He is over 6 months out from diagnosis of a small subsegmental pulmonary embolus by chest CTA in February.  We also discussed reducing his Imdur to 15 mg daily for now.  He is tolerating Repatha with very tight control of lipids, LDL 29.  He has seen Dr. Debara Pickett in the interim.  Past Medical History:  Diagnosis Date   Anginal pain ([place])    Arthritis    Asthma    Coronary atherosclerosis of native coronary artery    a. DES x 3 RCA and DES mCx 10/11 - [place] - with residual 50-70% stenosis in the distal Cx in AV groove, 70% distal LAD beyond apex, 70% small D1. b. 12/2017: similar results with multivessel CAD. Patent stents. Medical management recommended.  c. s/p NSTEMI in 10/2020 with DES to prox-LAD   Diverticulitis    GERD (gastroesophageal reflux disease)    History of kidney stones    Hypertension    Mixed hyperlipidemia    Myocardial infarction [facility])    Obstructive sleep apnea    Pulmonary embolus [facility])    February 2022   Pulmonary hypertension (HCC)    PASP 50 mmHg   Restless leg syndrome    Sarcoidosis    Statin intolerance     Ulcerative colitis ([place])     Past Surgical History:  Procedure Laterality Date   Ankle fracture repair Left 1985   APPENDECTOMY     CARDIAC CATHETERIZATION     stents x4   CHOLECYSTECTOMY  1970   CORONARY STENT INTERVENTION N/A 10/19/2020   Procedure: CORONARY STENT INTERVENTION;  Surgeon: Troy Sine, MD;  Location: [facility];  Service: Cardiovascular;  Laterality: N/A;   CYSTOSCOPY N/A 04/22/2021   Procedure: CYSTOSCOPY WITHE FIREFLY INJECTIONS;  Surgeon: Irine Seal, MD;  Location: [facility];  Service: Urology;  Laterality: N/A;   CYSTOSCOPY WITH INSERTION OF UROLIFT N/A 08/27/2017   Procedure: CYSTOSCOPY WITH INSERTION OF UROLIFT;  Surgeon: Cleon Gustin, MD;  Location: [facility];  Service: Urology;  Laterality: N/A;   JOINT REPLACEMENT     LAPAROSCOPIC APPENDECTOMY  2013   LEFT HEART CATH AND CORONARY ANGIOGRAPHY N/A 01/02/2018   Procedure: LEFT HEART CATH AND CORONARY ANGIOGRAPHY;  Surgeon: Troy Sine, MD;  Location: [place] [facility];  Service: Cardiovascular;  Laterality: N/A;   LEFT HEART CATH AND CORONARY ANGIOGRAPHY N/A 10/19/2020   Procedure: LEFT HEART CATH AND CORONARY ANGIOGRAPHY;  Surgeon: Troy Sine, MD;  Location: [place] [facility];  Service: Cardiovascular;  Laterality: N/A;   Right total knee replacement Bilateral 2005    Current Outpatient Medications  Medication Sig Dispense Refill  acetaminophen (TYLENOL) 500 MG tablet Take 1,000 mg by mouth 2 (two) times daily as needed for moderate pain.     amoxicillin (AMOXIL) 500 MG capsule Take 500 mg by mouth as needed. Before dental work     BREO ELLIPTA 200-25 MCG/INH AEPB Inhale 1 Inhaler into the lungs daily.  3   carbidopa-levodopa (SINEMET IR) 10-100 MG per tablet Take 1 tablet by mouth at bedtime.     clopidogrel (PLAVIX) 75 MG tablet TAKE 1 TABLET BY MOUTH EVERY DAY 30 tablet 5   cyanocobalamin (,VITAMIN B-12,) 1000 MCG/ML injection Inject 1,000 mcg into the muscle every 30 (thirty) days.      Dupilumab 300 MG/2ML SOPN Inject 300 mg into the skin every 14 (fourteen) days.     famotidine (PEPCID) 20 MG tablet Take 1 tablet (20 mg total) by mouth every evening. 30 tablet 3   finasteride (PROSCAR) 5 MG tablet Take 1 tablet (5 mg total) by mouth daily. 90 tablet 3   folic acid (FOLVITE) 1 MG tablet Take 1 mg by mouth See admin instructions. Mon - Fri     HYDROcodone-acetaminophen (NORCO/VICODIN) 5-325 MG tablet Take 1 tablet by mouth every 6 (six) hours as needed for severe pain.     HYDROcodone-acetaminophen (NORCO/VICODIN) 5-325 MG tablet Take 1-2 tablets by mouth every 6 (six) hours as needed for severe pain. 30 tablet 0   isosorbide mononitrate (IMDUR) 30 MG 24 hr tablet TAKE 1 TABLET BY MOUTH  DAILY 90 tablet 3   isosorbide mononitrate (IMDUR) 30 MG 24 hr tablet Take 0.5 tablets (15 mg total) by mouth daily. 45 tablet 3   losartan (COZAAR) 50 MG tablet TAKE 1 TABLET BY MOUTH  DAILY (Patient taking differently: 25 mg.) 90 tablet 3   metoprolol tartrate (LOPRESSOR) 25 MG tablet TAKE 1 TABLET (25 MG TOTAL) BY MOUTH TWO TIMES DAILY. 60 tablet 3   montelukast (SINGULAIR) 10 MG tablet Take 10 mg by mouth at bedtime.     nitroGLYCERIN (NITROSTAT) 0.4 MG SL tablet Place 1 tablet (0.4 mg total) under the tongue every 5 (five) minutes x 3 doses as needed for chest pain (if no relief after 3rd dose, proceed to the ED for an evaluation or call 911). 90 tablet 3   omeprazole (PRILOSEC) 40 MG capsule Take 40 mg by mouth daily.     oxymetazoline (AFRIN) 0.05 % nasal spray Place 1 spray into both nostrils 2 (two) times daily as needed for congestion.     REPATHA SURECLICK 623 MG/ML SOAJ INJECT CONTENTS OF 1 PEN UNDER THE SKIN EVERY 14 DAYS 6 mL 0   sulfamethoxazole-trimethoprim (BACTRIM DS) 800-160 MG tablet Take 1 tablet by mouth every 12 (twelve) hours. 56 tablet 0   temazepam (RESTORIL) 30 MG capsule Take 30 mg by mouth at bedtime.     Vitamin D, Ergocalciferol, (DRISDOL) 50000 UNITS CAPS capsule  Take 50,000 Units by mouth every 7 (seven) days. Thursdays     No current facility-administered medications for this visit.   Allergies:  Bee venom and Statins   ROS: No palpitations or syncope.  Physical Exam: VS:  BP 132/70   Pulse 61   Ht 6' (1.829 m)   Wt 197 lb 9.6 oz (89.6 kg)   SpO2 98%   BMI 26.80 kg/m , BMI Body mass index is 26.8 kg/m.  Wt Readings from Last 3 Encounters:  06/08/21 197 lb 9.6 oz (89.6 kg)  04/23/21 195 lb 12.3 oz (88.8 kg)  04/11/21 196 lb (  88.9 kg)    General: Patient appears comfortable at rest. HEENT: Conjunctiva and lids normal, wearing a mask. Neck: Supple, no elevated JVP or carotid bruits, no thyromegaly. Lungs: Clear to auscultation, nonlabored breathing at rest. Cardiac: Regular rate and rhythm, no S3 or significant systolic murmur, no pericardial rub. Extremities: No pitting edema.  ECG:  An ECG dated 01/24/2021 was personally reviewed today and demonstrated:  Sinus rhythm with prolonged PR interval and IVCD.  Recent Labwork: 10/19/2020: TSH 2.119 11/05/2020: B Natriuretic Peptide 105.0 03/31/2021: ALT 12; AST 19 04/24/2021: Magnesium 2.0 04/25/2021: BUN 11; Creatinine, Ser 0.87; Hemoglobin 8.2; Platelets 205; Potassium 3.4; Sodium 138     Component Value Date/Time   CHOL 91 03/31/2021 1128   CHOL 78 (L) 12/12/2019 0910   TRIG 94 03/31/2021 1128   HDL 43 03/31/2021 1128   HDL 42 12/12/2019 0910   CHOLHDL 2.1 03/31/2021 1128   VLDL 19 03/31/2021 1128   LDLCALC 29 03/31/2021 1128   LDLCALC 19 12/12/2019 0910    Other Studies Reviewed Today:  Echocardiogram 10/17/2020:  1. Left ventricular ejection fraction, by estimation, is 70 to 75%. The  left ventricle has hyperdynamic function. The left ventricle has no  regional wall motion abnormalities. There is mild left ventricular  hypertrophy of the basal-septal segment. Left  ventricular diastolic parameters are consistent with Grade I diastolic  dysfunction (impaired relaxation).   2.  Right ventricular systolic function is normal. The right ventricular  size is normal.   3. The mitral valve is normal in structure. Trivial mitral valve  regurgitation. No evidence of mitral stenosis.   4. The aortic valve is tricuspid. Aortic valve regurgitation is not  visualized. Mild aortic valve sclerosis is present, with no evidence of  aortic valve stenosis.   Assessment and Plan:  1.  CAD status post DES x3 to the RCA and DES to the circumflex in 2011, more recently NSTEMI in March with DES to the proximal LAD.  He reports no active angina at this time on medical therapy.  Continue Plavix, Lopressor, Cozaar, and Repatha.  Reducing Imdur to 15 mg daily.  2.  History of small subsegmental pulmonary embolus by chest CTA in February.  He is now greater than 6 months out and will complete course of Eliquis.  3.  Mixed hyperlipidemia with statin intolerance, doing well on Repatha with most recent LDL 29.  Medication Adjustments/Labs and Tests Ordered: Current medicines are reviewed at length with the patient today.  Concerns regarding medicines are outlined above.   Tests Ordered: No orders of the defined types were placed in this encounter.   Medication Changes: Meds ordered this encounter  Medications   isosorbide mononitrate (IMDUR) 30 MG 24 hr tablet    Sig: Take 0.5 tablets (15 mg total) by mouth daily.    Dispense:  45 tablet    Refill:  3     Disposition:  Follow up  3 months.  Signed, Satira Sark, MD, [facility] 06/08/2021 2:33 PM    [facility] at [facility] [address] [place], [place] [address] Phone: [phone]; Fax: [phone]

## 2021-06-08 ENCOUNTER — Other Ambulatory Visit: Payer: Self-pay

## 2021-06-08 ENCOUNTER — Encounter: Payer: Self-pay | Admitting: Cardiology

## 2021-06-08 ENCOUNTER — Ambulatory Visit (INDEPENDENT_AMBULATORY_CARE_PROVIDER_SITE_OTHER): Payer: Medicare Other | Admitting: Cardiology

## 2021-06-08 VITALS — BP 132/70 | HR 61 | Ht 72.0 in | Wt 197.6 lb

## 2021-06-08 DIAGNOSIS — I25119 Atherosclerotic heart disease of native coronary artery with unspecified angina pectoris: Secondary | ICD-10-CM

## 2021-06-08 DIAGNOSIS — I200 Unstable angina: Secondary | ICD-10-CM

## 2021-06-08 DIAGNOSIS — E782 Mixed hyperlipidemia: Secondary | ICD-10-CM | POA: Diagnosis not present

## 2021-06-08 DIAGNOSIS — Z86711 Personal history of pulmonary embolism: Secondary | ICD-10-CM

## 2021-06-08 MED ORDER — ISOSORBIDE MONONITRATE ER 30 MG PO TB24: 15.0000 mg | ORAL_TABLET | Freq: Every day | ORAL | 3 refills | Status: DC

## 2021-06-08 NOTE — Patient Instructions (Signed)
Medication Instructions:  Your physician has recommended you make the following change in your medication:  STOP ELIQUIS- after you are done with your current supply START Imdur 15 mg tablets daily  *If you need a refill on your cardiac medications before your next appointment, please call your pharmacy*   Lab Work: None If you have labs (blood work) drawn today and your tests are completely normal, you will receive your results only by: [place] (if you have MyChart) OR A paper copy in the mail If you have any lab test that is abnormal or we need to change your treatment, we will call you to review the results.   Testing/Procedures: None   Follow-Up: At [facility], you and your health needs are our priority.  As part of our continuing mission to provide you with exceptional heart care, we have created designated Provider Care Teams.  These Care Teams include your primary Cardiologist (physician) and Advanced Practice Providers (APPs -  Physician Assistants and Nurse Practitioners) who all work together to provide you with the care you need, when you need it.  We recommend signing up for the patient portal called "MyChart".  Sign up information is provided on this After Visit Summary.  MyChart is used to connect with patients for Virtual Visits (Telemedicine).  Patients are able to view lab/test results, encounter notes, upcoming appointments, etc.  Non-urgent messages can be sent to your provider as well.   To learn more about what you can do with MyChart, go to [email or web address].    Your next appointment:   3 month(s)  The format for your next appointment:   In Person  Provider:   Rozann Lesches, MD   Other Instructions

## 2021-06-21 ENCOUNTER — Other Ambulatory Visit: Payer: Self-pay | Admitting: Cardiology

## 2021-06-21 DIAGNOSIS — I2510 Atherosclerotic heart disease of native coronary artery without angina pectoris: Secondary | ICD-10-CM

## 2021-06-21 DIAGNOSIS — I214 Non-ST elevation (NSTEMI) myocardial infarction: Secondary | ICD-10-CM

## 2021-07-08 DIAGNOSIS — Z299 Encounter for prophylactic measures, unspecified: Secondary | ICD-10-CM | POA: Diagnosis not present

## 2021-07-08 DIAGNOSIS — L4050 Arthropathic psoriasis, unspecified: Secondary | ICD-10-CM | POA: Diagnosis not present

## 2021-07-08 DIAGNOSIS — M545 Low back pain, unspecified: Secondary | ICD-10-CM | POA: Diagnosis not present

## 2021-07-08 DIAGNOSIS — I10 Essential (primary) hypertension: Secondary | ICD-10-CM | POA: Diagnosis not present

## 2021-07-08 DIAGNOSIS — G4733 Obstructive sleep apnea (adult) (pediatric): Secondary | ICD-10-CM | POA: Diagnosis not present

## 2021-07-11 DIAGNOSIS — M5416 Radiculopathy, lumbar region: Secondary | ICD-10-CM | POA: Diagnosis not present

## 2021-07-18 ENCOUNTER — Encounter: Payer: Self-pay | Admitting: Urology

## 2021-07-18 ENCOUNTER — Ambulatory Visit (INDEPENDENT_AMBULATORY_CARE_PROVIDER_SITE_OTHER): Payer: Medicare Other | Admitting: Urology

## 2021-07-18 ENCOUNTER — Other Ambulatory Visit: Payer: Self-pay

## 2021-07-18 VITALS — BP 119/58 | HR 57

## 2021-07-18 DIAGNOSIS — N3281 Overactive bladder: Secondary | ICD-10-CM | POA: Diagnosis not present

## 2021-07-18 DIAGNOSIS — I200 Unstable angina: Secondary | ICD-10-CM

## 2021-07-18 DIAGNOSIS — N400 Enlarged prostate without lower urinary tract symptoms: Secondary | ICD-10-CM

## 2021-07-18 DIAGNOSIS — R310 Gross hematuria: Secondary | ICD-10-CM

## 2021-07-18 LAB — URINALYSIS, ROUTINE W REFLEX MICROSCOPIC
Bilirubin, UA: NEGATIVE
Glucose, UA: NEGATIVE
Ketones, UA: NEGATIVE
Leukocytes,UA: NEGATIVE
Nitrite, UA: NEGATIVE
Protein,UA: NEGATIVE
RBC, UA: NEGATIVE
Specific Gravity, UA: 1.015 (ref 1.005–1.030)
Urobilinogen, Ur: 0.2 mg/dL (ref 0.2–1.0)
pH, UA: 6 (ref 5.0–7.5)

## 2021-07-18 MED ORDER — FINASTERIDE 5 MG PO TABS: 5.0000 mg | ORAL_TABLET | Freq: Every day | ORAL | 3 refills | Status: DC

## 2021-07-18 NOTE — Progress Notes (Signed)
07/18/2021 10:01 AM   Richard Webb 22-Apr-1946 [id number]  Referring provider: [name], MD [place],  [place] [id number]  Followup BPH and gross hematuria   HPI: Richard Webb is a 75yo here for followup for BPH, gross hematuria and hematospermia. Gross hematuria and hematospermia resolved since last visit. Urine stream strong. IPSS 15 QOL 2. He has intermittent dysuria at the initiation of urination. Urine stream strong. He has issues getting and maintaining an erection.  He is on a nitrate.    PMH: Past Medical History:  Diagnosis Date   Anginal pain ([place])    Arthritis    Asthma    Coronary atherosclerosis of native coronary artery    a. DES x 3 RCA and DES mCx 10/11 - [place] - with residual 50-70% stenosis in the distal Cx in AV groove, 70% distal LAD beyond apex, 70% small D1. b. 12/2017: similar results with multivessel CAD. Patent stents. Medical management recommended.  c. s/p NSTEMI in 10/2020 with DES to prox-LAD   Diverticulitis    GERD (gastroesophageal reflux disease)    History of kidney stones    Hypertension    Mixed hyperlipidemia    Myocardial infarction [facility])    Obstructive sleep apnea    Pulmonary embolus [facility])    February 2022   Pulmonary hypertension (HCC)    PASP 50 mmHg   Restless leg syndrome    Sarcoidosis    Statin intolerance    Ulcerative colitis ([place])     Surgical History: Past Surgical History:  Procedure Laterality Date   Ankle fracture repair Left 1985   APPENDECTOMY     CARDIAC CATHETERIZATION     stents x4   CHOLECYSTECTOMY  1970   CORONARY STENT INTERVENTION N/A 10/19/2020   Procedure: CORONARY STENT INTERVENTION;  Surgeon: Troy Sine, MD;  Location: [place] [facility];  Service: Cardiovascular;  Laterality: N/A;   CYSTOSCOPY N/A 04/22/2021   Procedure: CYSTOSCOPY WITHE FIREFLY INJECTIONS;  Surgeon: Irine Seal, MD;  Location: [facility];  Service: Urology;  Laterality: N/A;   CYSTOSCOPY WITH INSERTION OF UROLIFT  N/A 08/27/2017   Procedure: CYSTOSCOPY WITH INSERTION OF UROLIFT;  Surgeon: Cleon Gustin, MD;  Location: [facility];  Service: Urology;  Laterality: N/A;   JOINT REPLACEMENT     LAPAROSCOPIC APPENDECTOMY  2013   LEFT HEART CATH AND CORONARY ANGIOGRAPHY N/A 01/02/2018   Procedure: LEFT HEART CATH AND CORONARY ANGIOGRAPHY;  Surgeon: Troy Sine, MD;  Location: [place];  Service: Cardiovascular;  Laterality: N/A;   LEFT HEART CATH AND CORONARY ANGIOGRAPHY N/A 10/19/2020   Procedure: LEFT HEART CATH AND CORONARY ANGIOGRAPHY;  Surgeon: Troy Sine, MD;  Location: [facility] [facility];  Service: Cardiovascular;  Laterality: N/A;   Right total knee replacement Bilateral 2005    Home Medications:  Allergies as of 07/18/2021       Reactions   Bee Venom Anaphylaxis   Statins Other (See Comments)   Myalgias - Simvastatin and Rosuvastatin        Medication List        Accurate as of July 18, 2021 10:01 AM. If you have any questions, ask your nurse or doctor.          STOP taking these medications    amoxicillin 500 MG capsule Commonly known as: AMOXIL Stopped by: Nicolette Bang, MD   isosorbide mononitrate 30 MG 24 hr tablet Commonly known as: IMDUR Stopped by: Nicolette Bang, MD   nitroGLYCERIN  0.4 MG SL tablet Commonly known as: NITROSTAT Stopped by: Nicolette Bang, MD       TAKE these medications    acetaminophen 500 MG tablet Commonly known as: TYLENOL Take 1,000 mg by mouth 2 (two) times daily as needed for moderate pain.   Breo Ellipta 200-25 MCG/ACT Aepb Generic drug: fluticasone furoate-vilanterol Inhale 1 Inhaler into the lungs daily.   carbidopa-levodopa 10-100 MG tablet Commonly known as: SINEMET IR Take 1 tablet by mouth at bedtime.   clopidogrel 75 MG tablet Commonly known as: PLAVIX TAKE 1 TABLET BY MOUTH EVERY DAY   cyanocobalamin 1000 MCG/ML injection Commonly known as: (VITAMIN B-12) Inject 1,000 mcg into the  muscle every 30 (thirty) days.   Dupilumab 300 MG/2ML Sopn Inject 300 mg into the skin every 14 (fourteen) days.   famotidine 20 MG tablet Commonly known as: PEPCID Take 1 tablet (20 mg total) by mouth every evening.   finasteride 5 MG tablet Commonly known as: PROSCAR Take 1 tablet (5 mg total) by mouth daily.   folic acid 1 MG tablet Commonly known as: FOLVITE Take 1 mg by mouth See admin instructions. Mon - Fri   HYDROcodone-acetaminophen 5-325 MG tablet Commonly known as: NORCO/VICODIN Take 1-2 tablets by mouth every 6 (six) hours as needed for severe pain. What changed: Another medication with the same name was removed. Continue taking this medication, and follow the directions you see here. Changed by: Nicolette Bang, MD   losartan 50 MG tablet Commonly known as: COZAAR TAKE 1 TABLET BY MOUTH  DAILY What changed:  how much to take how to take this when to take this   metoprolol tartrate 25 MG tablet Commonly known as: LOPRESSOR TAKE 1 TABLET (25 MG TOTAL) BY MOUTH TWO TIMES DAILY.   montelukast 10 MG tablet Commonly known as: SINGULAIR Take 10 mg by mouth at bedtime.   omeprazole 40 MG capsule Commonly known as: PRILOSEC Take 40 mg by mouth daily.   oxymetazoline 0.05 % nasal spray Commonly known as: AFRIN Place 1 spray into both nostrils 2 (two) times daily as needed for congestion.   Repatha SureClick 010 MG/ML [place] Generic drug: Evolocumab INJECT CONTENTS OF 1 PEN UNDER THE SKIN EVERY 14 DAYS   sulfamethoxazole-trimethoprim 800-160 MG tablet Commonly known as: BACTRIM DS Take 1 tablet by mouth every 12 (twelve) hours.   temazepam 30 MG capsule Commonly known as: RESTORIL Take 30 mg by mouth at bedtime.   Vitamin D (Ergocalciferol) 1.25 MG (50000 UNIT) Caps capsule Commonly known as: DRISDOL Take 50,000 Units by mouth every 7 (seven) days. Thursdays        Allergies:  Allergies  Allergen Reactions   Bee Venom Anaphylaxis   Statins Other  (See Comments)    Myalgias - Simvastatin and Rosuvastatin    Family History: Family History  Problem Relation Age of Onset   Colon cancer Mother    CAD Father     Social History:  reports that he has never smoked. He has never used smokeless tobacco. He reports that he does not drink alcohol and does not use drugs.  ROS: All other review of systems were reviewed and are negative except what is noted above in HPI  Physical Exam: BP (!) 119/58   Pulse (!) 57   Constitutional:  Alert and oriented, No acute distress. HEENT: [place] AT, moist mucus membranes.  Trachea midline, no masses. Cardiovascular: No clubbing, cyanosis, or edema. Respiratory: Normal respiratory effort, no increased work of breathing. GI: Abdomen  is soft, nontender, nondistended, no abdominal masses GU: No CVA tenderness.  Lymph: No cervical or inguinal lymphadenopathy. Skin: No rashes, bruises or suspicious lesions. Neurologic: Grossly intact, no focal deficits, moving all 4 extremities. Psychiatric: Normal mood and affect.  Laboratory Data: Lab Results  Component Value Date   WBC 10.4 04/25/2021   HGB 8.2 (L) 04/25/2021   HCT 26.4 (L) 04/25/2021   MCV 89.8 04/25/2021   PLT 205 04/25/2021    Lab Results  Component Value Date   CREATININE 0.87 04/25/2021    No results found for: PSA  No results found for: TESTOSTERONE  Lab Results  Component Value Date   HGBA1C 4.9 04/11/2021    Urinalysis    Component Value Date/Time   COLORURINE AMBER (A) 01/24/2021 0957   APPEARANCEUR Hazy (A) 05/16/2021 1014   LABSPEC 1.004 (L) 01/24/2021 0957   PHURINE 7.0 01/24/2021 0957   GLUCOSEU Negative 05/16/2021 1014   HGBUR LARGE (A) 01/24/2021 0957   BILIRUBINUR Negative 05/16/2021 1014   KETONESUR NEGATIVE 01/24/2021 0957   PROTEINUR Negative 05/16/2021 1014   PROTEINUR NEGATIVE 01/24/2021 0957   NITRITE Negative 05/16/2021 1014   NITRITE NEGATIVE 01/24/2021 0957   LEUKOCYTESUR Negative 05/16/2021 1014    LEUKOCYTESUR LARGE (A) 01/24/2021 0957    Lab Results  Component Value Date   LABMICR See below: 05/16/2021   WBCUA None seen 05/16/2021   LABEPIT None seen 05/16/2021   MUCUS Present 05/16/2021   BACTERIA None seen 05/16/2021    Pertinent Imaging:  No results found for this or any previous visit.  No results found for this or any previous visit.  No results found for this or any previous visit.  No results found for this or any previous visit.  No results found for this or any previous visit.  No results found for this or any previous visit.  Results for orders placed during the hospital encounter of 04/09/20  CT HEMATURIA WORKUP  Narrative CLINICAL DATA:  Gross hematuria with lower abdominal and groin pain  EXAM: CT ABDOMEN AND PELVIS WITHOUT AND WITH CONTRAST  TECHNIQUE: Multidetector CT imaging of the abdomen and pelvis was performed following the standard protocol before and following the bolus administration of intravenous contrast.  CONTRAST:  142m OMNIPAQUE IOHEXOL 300 MG/ML  SOLN  COMPARISON:  Abdomen and pelvis CT from 2017  FINDINGS: Lower chest: 9 x 7 mm part solid pulmonary nodule (image 12, series 4) septal thickening at the lung bases as well with some subpleural reticulation. Concern in the LEFT lung base on the prior exam measuring approximately 5-6 mm.  Mild septal thickening also present at the RIGHT lung base with parenchymal scarring as slightly worse than the previous exam.  On prone imaging there is persistent subpleural reticulation and there is some nodularity that is more evident (image 41, series 15) 5 mm juxta diaphragmatic pulmonary nodule. No consolidation or sign of pleural effusion. Multiple small nodules are seen at the RIGHT lung base as well, for instance on image 14 of series 15 there are at least 5/6 additional small nodules, a 4 mm nodule in the RIGHT lung base serving as an example of multiple small  nodules.  Hepatobiliary: Mildly nodular hepatic contours with fissural widening. Portal vein is patent liver contours are unchanged since 2017. No focal, suspicious hepatic lesion. Post cholecystectomy without gross biliary duct distension.  Pancreas: Pancreas without focal lesion or ductal dilation. No signs of peripancreatic inflammation.  Spleen: Spleen normal in size and contour with no  focal lesion. Small adjacent splenule.  Perisplenic collaterals in the LEFT hemiabdomen. Splenic vein remains patent as does the SMV.  Adrenals/Urinary Tract: Adrenal glands are normal.  Bilateral renal cysts, largest on the RIGHT approximately 4.5 x 3.4 cm. No hydronephrosis. No ureteral calculus.  Small lower pole calculus on the LEFT. This measures approximately 3 mm.  Mild perivesical stranding. Bladder wall thickening with adjacent diverticular inflammation  Large inflamed diverticulum versus small pericolonic abscess from diverticulitis sits atop the urinary bladder. (Image 71 series 7) and image 52, series 11) there is colonic thickening as well in this area.  No sign of upper tract lesion.  Stomach/Bowel: Small hiatal hernia. No signs of small bowel obstruction or acute small bowel process. Post appendectomy.  Segmental colonic thickening more pronounced adjacent to pericolonic fluid and gas that abuts the urinary bladder and tracks along the dome of the bladder. This area measures approximately 2.3 x 1.9 cm. No gas in the urinary bladder at this time.  Extending superiorly from the sigmoid colon is a gas containing tract best seen on image 71 of series 12. There is a diverticulum in this area on the previous study but this appears more pronounced and is associated with fluid density that extends superiorly from the colon, adjacent to a wide-mouth diverticulum. There is no pericolonic inflammation in this location. The area extending from the gas containing tract abuts small  bowel loops measuring approximately 2.1 x 1.4 cm.  Vascular/Lymphatic: Calcified atheromatous plaque in the abdominal aorta, no aneurysm. No adenopathy in the retroperitoneum. Small lymph nodes in the upper abdomen are unchanged.  No pelvic lymphadenopathy.  Reproductive: Post uro lift procedure.  Post RIGHT orchiectomy.  Other: No free air.  No ascites.  Musculoskeletal: No acute musculoskeletal process. No destructive bone finding. Spinal degenerative changes.  IMPRESSION: 1. Pericolonic abscess likely related to diverticulitis between the sigmoid and the urinary bladder with thickening of the wall of the urinary bladder, likely secondary cystitis. No current signs of colovesical fistula though with this appearance fistula formation is a risk. 2. Eccentric thickening of the inferior wall of the colon is noted, potentially related to more pronounced inflammation in this location would however suggest follow-up colonoscopy to exclude underlying colonic lesion. 3. Small gas containing tract extending superiorly from the sigmoid slightly downstream from this area of abnormality was present previously but appears slightly larger than on the prior study. Significance uncertain. Potentially related to prior diverticulitis. 4. No free air or ascites. 5. No signs of upper tract lesion with renal cortical scarring, nephrolithiasis and renal cysts. 6. 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 Correlate with any clinical or laboratory evidence of liver disease. 7. Multiple small pulmonary nodules at the lung bases with septal thickening and subpleural reticulation. Correlate with any risk factors for aspiration or history of chronic infection. Given the 8 mm mean diameter pulmonary nodule which has enlarged since prior imaging consider dedicated chest CT and/or comparison with prior evaluations. Based on spiculated appearance scarring or  bronchogenic neoplasm could have this appearance. 8. Aortic atherosclerosis.  Aortic Atherosclerosis (ICD10-I70.0).   Electronically Signed By: Zetta Bills M.D. On: 04/10/2020 16:57  No results found for this or any previous visit.   Assessment & Plan:    1. Benign prostatic hyperplasia, unspecified whether lower urinary tract symptoms present -Continue finasteride 17m daily - Urinalysis, Routine w reflex microscopic  2. OAB (overactive bladder) -timed voiding  3. Gross hematuria -resolved   No follow-ups on file.  Nicolette Bang, MD  [facility] [place]

## 2021-07-18 NOTE — Progress Notes (Signed)
Urological Symptom Review  Patient is experiencing the following symptoms: Blood in urine Penile pain (male only)    Review of Systems  Gastrointestinal (upper)  : Negative for upper GI symptoms  Gastrointestinal (lower) : Negative for lower GI symptoms  Constitutional : Negative for symptoms  Skin: Negative for skin symptoms  Eyes: Negative for eye symptoms  Ear/Nose/Throat : Negative for Ear/Nose/Throat symptoms  Hematologic/Lymphatic: Negative for Hematologic/Lymphatic symptoms  Cardiovascular : Negative for cardiovascular symptoms  Respiratory : Negative for respiratory symptoms  Endocrine: Negative for endocrine symptoms  Musculoskeletal: Back pain  Neurological: Negative for neurological symptoms  Psychologic: Negative for psychiatric symptoms

## 2021-07-18 NOTE — Patient Instructions (Signed)

## 2021-08-11 ENCOUNTER — Telehealth: Payer: Self-pay

## 2021-08-11 NOTE — Telephone Encounter (Signed)
Patient approved for Repatha 150 mg/ml [id number] through 08/20/2022.

## 2021-08-11 NOTE — Telephone Encounter (Signed)
PA for Repatha submitted via CMM. Key: [id number]

## 2021-08-11 NOTE — Telephone Encounter (Signed)
Patient approved for Repatha 140 mg/ml [id number] through 08/20/2022.

## 2021-09-06 DIAGNOSIS — M5416 Radiculopathy, lumbar region: Secondary | ICD-10-CM | POA: Diagnosis not present

## 2021-09-06 DIAGNOSIS — M5412 Radiculopathy, cervical region: Secondary | ICD-10-CM | POA: Diagnosis not present

## 2021-09-08 ENCOUNTER — Ambulatory Visit (INDEPENDENT_AMBULATORY_CARE_PROVIDER_SITE_OTHER): Payer: Medicare Other | Admitting: Cardiology

## 2021-09-08 ENCOUNTER — Encounter: Payer: Self-pay | Admitting: Cardiology

## 2021-09-08 VITALS — BP 140/76 | HR 63 | Ht 72.0 in | Wt 212.0 lb

## 2021-09-08 DIAGNOSIS — E782 Mixed hyperlipidemia: Secondary | ICD-10-CM

## 2021-09-08 DIAGNOSIS — I10 Essential (primary) hypertension: Secondary | ICD-10-CM

## 2021-09-08 DIAGNOSIS — I25119 Atherosclerotic heart disease of native coronary artery with unspecified angina pectoris: Secondary | ICD-10-CM | POA: Diagnosis not present

## 2021-09-08 MED ORDER — CLOPIDOGREL BISULFATE 75 MG PO TABS: 75.0000 mg | ORAL_TABLET | Freq: Every day | ORAL | 5 refills | Status: DC

## 2021-09-08 MED ORDER — LOSARTAN POTASSIUM 50 MG PO TABS: 50.0000 mg | ORAL_TABLET | Freq: Every day | ORAL | 1 refills | Status: AC

## 2021-09-08 NOTE — Patient Instructions (Addendum)
Medication Instructions:  Your physician has recommended you make the following change in your medication:  Stop isosorbide mononitrate Increase losartan to 50 mg daily Stop clopidogrel at the end of March 2023 and start aspirin 81 mg daily Continue all other medications the same  Labwork: none  Testing/Procedures: none  Follow-Up: Your physician recommends that you schedule a follow-up appointment in: 3 months  Any Other Special Instructions Will Be Listed Below (If Applicable).  If you need a refill on your cardiac medications before your next appointment, please call your pharmacy.

## 2021-09-08 NOTE — Progress Notes (Signed)
Cardiology Office Note  Date: 09/08/2021   ID: Richard Webb, DOB [birth year], MRN [id number]  PCP:  [name], MD  Cardiologist:  Rozann Lesches, MD Electrophysiologist:  None   Chief Complaint  Patient presents with   Cardiac follow-up    History of Present Illness: Richard Webb is a 76 y.o. male last seen in October 2022.  He is here for a follow-up visit.  Reports no active angina symptoms at this time on current medications including prior reduction in Imdur to 15 mg daily.  He exercises at the gym 2 to 3 days a week.  He tells me that he plans to retire at the end of May and is also engaged to be married at that time.  He will be moving to [place] thereafter.  I reviewed his medications which are noted below.  We discussed discontinuation of Imdur, increase in Cozaar to 50 mg daily.  At the end of March he will come off Plavix and be on aspirin 81 mg daily.  He remains on Repatha and has had very aggressive LDL control as noted below.  Past Medical History:  Diagnosis Date   Arthritis    Asthma    Coronary atherosclerosis of native coronary artery    a. DES x 3 RCA and DES mCx 10/11 - [place] - with residual 50-70% stenosis in the distal Cx in AV groove, 70% distal LAD beyond apex, 70% small D1. b. 12/2017: similar results with multivessel CAD. Patent stents. Medical management recommended.  c. s/p NSTEMI in 10/2020 with DES to prox-LAD   Diverticulitis    GERD (gastroesophageal reflux disease)    History of kidney stones    Hypertension    Mixed hyperlipidemia    Myocardial infarction [facility])    Obstructive sleep apnea    Pulmonary embolus [facility])    February 2022   Pulmonary hypertension (HCC)    PASP 50 mmHg   Restless leg syndrome    Sarcoidosis    Statin intolerance    Ulcerative colitis ([place])     Past Surgical History:  Procedure Laterality Date   Ankle fracture repair Left 1985   APPENDECTOMY     CARDIAC CATHETERIZATION     stents x4    CHOLECYSTECTOMY  1970   CORONARY STENT INTERVENTION N/A 10/19/2020   Procedure: CORONARY STENT INTERVENTION;  Surgeon: Troy Sine, MD;  Location: [place] CV LAB;  Service: Cardiovascular;  Laterality: N/A;   CYSTOSCOPY N/A 04/22/2021   Procedure: CYSTOSCOPY WITHE FIREFLY INJECTIONS;  Surgeon: Irine Seal, MD;  Location: [facility];  Service: Urology;  Laterality: N/A;   CYSTOSCOPY WITH INSERTION OF UROLIFT N/A 08/27/2017   Procedure: CYSTOSCOPY WITH INSERTION OF UROLIFT;  Surgeon: Cleon Gustin, MD;  Location: [facility];  Service: Urology;  Laterality: N/A;   JOINT REPLACEMENT     LAPAROSCOPIC APPENDECTOMY  2013   LEFT HEART CATH AND CORONARY ANGIOGRAPHY N/A 01/02/2018   Procedure: LEFT HEART CATH AND CORONARY ANGIOGRAPHY;  Surgeon: Troy Sine, MD;  Location: [place] CV LAB;  Service: Cardiovascular;  Laterality: N/A;   LEFT HEART CATH AND CORONARY ANGIOGRAPHY N/A 10/19/2020   Procedure: LEFT HEART CATH AND CORONARY ANGIOGRAPHY;  Surgeon: Troy Sine, MD;  Location: [place] [facility];  Service: Cardiovascular;  Laterality: N/A;   Right total knee replacement Bilateral 2005    Current Outpatient Medications  Medication Sig Dispense Refill   acetaminophen (TYLENOL) 500 MG tablet Take 1,000 mg by mouth 2 (two) times  daily as needed for moderate pain.     BREO ELLIPTA 200-25 MCG/INH AEPB Inhale 1 Inhaler into the lungs daily.  3   carbidopa-levodopa (SINEMET IR) 10-100 MG per tablet Take 1 tablet by mouth at bedtime.     cyanocobalamin (,VITAMIN B-12,) 1000 MCG/ML injection Inject 1,000 mcg into the muscle every 30 (thirty) days.     Dupilumab 300 MG/2ML SOPN Inject 300 mg into the skin every 14 (fourteen) days.     finasteride (PROSCAR) 5 MG tablet Take 1 tablet (5 mg total) by mouth daily. 90 tablet 3   folic acid (FOLVITE) 1 MG tablet Take 1 mg by mouth See admin instructions. Mon - Fri     HYDROcodone-acetaminophen (NORCO/VICODIN) 5-325 MG tablet Take 1-2 tablets by mouth  every 6 (six) hours as needed for severe pain. 30 tablet 0   metoprolol tartrate (LOPRESSOR) 25 MG tablet Take 12.5 mg by mouth 2 (two) times daily.     montelukast (SINGULAIR) 10 MG tablet Take 10 mg by mouth at bedtime.     omeprazole (PRILOSEC) 40 MG capsule Take 40 mg by mouth daily.     oxymetazoline (AFRIN) 0.05 % nasal spray Place 1 spray into both nostrils 2 (two) times daily as needed for congestion.     REPATHA SURECLICK 962 MG/ML SOAJ INJECT CONTENTS OF 1 PEN UNDER THE SKIN EVERY 14 DAYS 6 mL 1   temazepam (RESTORIL) 30 MG capsule Take 30 mg by mouth at bedtime.     Vitamin D, Ergocalciferol, (DRISDOL) 50000 UNITS CAPS capsule Take 50,000 Units by mouth every 7 (seven) days. Thursdays     clopidogrel (PLAVIX) 75 MG tablet Take 1 tablet (75 mg total) by mouth daily. Stop end of March 2023 and start aspirin 81 mg daily 30 tablet 5   losartan (COZAAR) 50 MG tablet Take 1 tablet (50 mg total) by mouth daily. 90 tablet 1   No current facility-administered medications for this visit.   Allergies:  Bee venom and Statins   ROS: No palpitations or syncope.  Physical Exam: VS:  BP 140/76    Pulse 63    Ht 6' (1.829 m)    Wt 212 lb (96.2 kg)    SpO2 98%    BMI 28.75 kg/m , BMI Body mass index is 28.75 kg/m.  Wt Readings from Last 3 Encounters:  09/08/21 212 lb (96.2 kg)  06/08/21 197 lb 9.6 oz (89.6 kg)  04/23/21 195 lb 12.3 oz (88.8 kg)    General: Patient appears comfortable at rest. HEENT: Conjunctiva and lids normal, wearing a mask. Neck: Supple, no elevated JVP or carotid bruits, no thyromegaly. Lungs: Clear to auscultation, nonlabored breathing at rest. Cardiac: Regular rate and rhythm, no S3, 1/6 systolic murmur, no pericardial rub. Extremities: No pitting edema, distal pulses 2+.  ECG:  An ECG dated 01/24/2021 was personally reviewed today and demonstrated:  Sinus rhythm with prolonged PR interval and IVCD.  Recent Labwork: 10/19/2020: TSH 2.119 11/05/2020: B Natriuretic  Peptide 105.0 03/31/2021: ALT 12; AST 19 04/24/2021: Magnesium 2.0 04/25/2021: BUN 11; Creatinine, Ser 0.87; Hemoglobin 8.2; Platelets 205; Potassium 3.4; Sodium 138     Component Value Date/Time   CHOL 91 03/31/2021 1128   CHOL 78 (L) 12/12/2019 0910   TRIG 94 03/31/2021 1128   HDL 43 03/31/2021 1128   HDL 42 12/12/2019 0910   CHOLHDL 2.1 03/31/2021 1128   VLDL 19 03/31/2021 1128   LDLCALC 29 03/31/2021 1128   LDLCALC 19 12/12/2019 0910  Other Studies Reviewed Today:  Echocardiogram 10/17/2020:  1. Left ventricular ejection fraction, by estimation, is 70 to 75%. The  left ventricle has hyperdynamic function. The left ventricle has no  regional wall motion abnormalities. There is mild left ventricular  hypertrophy of the basal-septal segment. Left  ventricular diastolic parameters are consistent with Grade I diastolic  dysfunction (impaired relaxation).   2. Right ventricular systolic function is normal. The right ventricular  size is normal.   3. The mitral valve is normal in structure. Trivial mitral valve  regurgitation. No evidence of mitral stenosis.   4. The aortic valve is tricuspid. Aortic valve regurgitation is not  visualized. Mild aortic valve sclerosis is present, with no evidence of  aortic valve stenosis.   Assessment and Plan:  1.  CAD status post DES x3 to the RCA and DES to the circumflex in 2011, more recently NSTEMI in March 2022 with DES to the proximal LAD at that time.  He has done well since then, no active angina at this time.  Plan to discontinue Imdur.  He will go to aspirin 81 mg daily at the end of March and otherwise continue Lopressor, losartan, and Repatha.  2.  Mixed hyperlipidemia with statin intolerance.  Continue Repatha, last LDL 29.  3.  Essential hypertension, increase losartan to 50 mg daily.  4.  History of small subsegmental pulmonary embolus in February 2022, completed greater than 6 months of anticoagulation and no longer on  Eliquis.  Medication Adjustments/Labs and Tests Ordered: Current medicines are reviewed at length with the patient today.  Concerns regarding medicines are outlined above.   Tests Ordered: No orders of the defined types were placed in this encounter.   Medication Changes: Meds ordered this encounter  Medications   losartan (COZAAR) 50 MG tablet    Sig: Take 1 tablet (50 mg total) by mouth daily.    Dispense:  90 tablet    Refill:  1    09/08/2021 dose increase   clopidogrel (PLAVIX) 75 MG tablet    Sig: Take 1 tablet (75 mg total) by mouth daily. Stop end of March 2023 and start aspirin 81 mg daily    Dispense:  30 tablet    Refill:  5    Disposition:  Follow up  April.  Signed, Satira Sark, MD, [facility] 09/08/2021 2:12 PM    [facility] at [place], [place],  [address] Phone: [phone]; Fax: [phone]

## 2021-09-13 DIAGNOSIS — J45909 Unspecified asthma, uncomplicated: Secondary | ICD-10-CM | POA: Diagnosis not present

## 2021-09-13 DIAGNOSIS — G4733 Obstructive sleep apnea (adult) (pediatric): Secondary | ICD-10-CM | POA: Diagnosis not present

## 2021-09-14 NOTE — Telephone Encounter (Signed)
Eliquis 5 mg samples never picked up so restocked

## 2021-09-16 DIAGNOSIS — Z125 Encounter for screening for malignant neoplasm of prostate: Secondary | ICD-10-CM | POA: Diagnosis not present

## 2021-09-16 DIAGNOSIS — Z0000 Encounter for general adult medical examination without abnormal findings: Secondary | ICD-10-CM | POA: Diagnosis not present

## 2021-09-16 DIAGNOSIS — L4050 Arthropathic psoriasis, unspecified: Secondary | ICD-10-CM | POA: Diagnosis not present

## 2021-09-16 DIAGNOSIS — Z1331 Encounter for screening for depression: Secondary | ICD-10-CM | POA: Diagnosis not present

## 2021-09-16 DIAGNOSIS — I10 Essential (primary) hypertension: Secondary | ICD-10-CM | POA: Diagnosis not present

## 2021-09-16 DIAGNOSIS — Z299 Encounter for prophylactic measures, unspecified: Secondary | ICD-10-CM | POA: Diagnosis not present

## 2021-09-16 DIAGNOSIS — Z79899 Other long term (current) drug therapy: Secondary | ICD-10-CM | POA: Diagnosis not present

## 2021-09-16 DIAGNOSIS — Z7189 Other specified counseling: Secondary | ICD-10-CM | POA: Diagnosis not present

## 2021-09-16 DIAGNOSIS — Z1339 Encounter for screening examination for other mental health and behavioral disorders: Secondary | ICD-10-CM | POA: Diagnosis not present

## 2021-09-16 DIAGNOSIS — Z6828 Body mass index (BMI) 28.0-28.9, adult: Secondary | ICD-10-CM | POA: Diagnosis not present

## 2021-09-16 DIAGNOSIS — R5383 Other fatigue: Secondary | ICD-10-CM | POA: Diagnosis not present

## 2021-09-16 DIAGNOSIS — E7800 Pure hypercholesterolemia, unspecified: Secondary | ICD-10-CM | POA: Diagnosis not present

## 2021-09-20 DIAGNOSIS — I10 Essential (primary) hypertension: Secondary | ICD-10-CM | POA: Diagnosis not present

## 2021-09-20 DIAGNOSIS — K449 Diaphragmatic hernia without obstruction or gangrene: Secondary | ICD-10-CM | POA: Diagnosis not present

## 2021-09-23 ENCOUNTER — Other Ambulatory Visit: Payer: Self-pay

## 2021-09-23 ENCOUNTER — Ambulatory Visit (INDEPENDENT_AMBULATORY_CARE_PROVIDER_SITE_OTHER): Payer: Medicare Other | Admitting: Urology

## 2021-09-23 VITALS — BP 148/65 | HR 74

## 2021-09-23 DIAGNOSIS — N400 Enlarged prostate without lower urinary tract symptoms: Secondary | ICD-10-CM

## 2021-09-23 DIAGNOSIS — N5201 Erectile dysfunction due to arterial insufficiency: Secondary | ICD-10-CM | POA: Diagnosis not present

## 2021-09-23 DIAGNOSIS — I25119 Atherosclerotic heart disease of native coronary artery with unspecified angina pectoris: Secondary | ICD-10-CM | POA: Diagnosis not present

## 2021-09-23 DIAGNOSIS — R310 Gross hematuria: Secondary | ICD-10-CM

## 2021-09-23 DIAGNOSIS — N3281 Overactive bladder: Secondary | ICD-10-CM

## 2021-09-23 LAB — URINALYSIS, ROUTINE W REFLEX MICROSCOPIC
Bilirubin, UA: NEGATIVE
Glucose, UA: NEGATIVE
Ketones, UA: NEGATIVE
Leukocytes,UA: NEGATIVE
Nitrite, UA: NEGATIVE
Protein,UA: NEGATIVE
Specific Gravity, UA: <1.005 — ABNORMAL LOW (ref 1.005–1.030)
Urobilinogen, Ur: 0.2 mg/dL (ref 0.2–1.0)
pH, UA: 6 (ref 5.0–7.5)

## 2021-09-23 LAB — MICROSCOPIC EXAMINATION
Bacteria, UA: NONE SEEN
Epithelial Cells (non renal): NONE SEEN /hpf (ref 0–10)
RBC, Urine: NONE SEEN /hpf (ref 0–2)
Renal Epithel, UA: NONE SEEN /hpf
WBC, UA: NONE SEEN /hpf (ref 0–5)

## 2021-09-23 MED ORDER — FINASTERIDE 5 MG PO TABS: 5.0000 mg | ORAL_TABLET | Freq: Every day | ORAL | 3 refills | Status: AC

## 2021-09-23 MED ORDER — TADALAFIL 20 MG PO TABS: 20.0000 mg | ORAL_TABLET | Freq: Every day | ORAL | 5 refills | Status: DC | PRN

## 2021-09-23 NOTE — Progress Notes (Signed)
Urological Symptom Review  Patient is experiencing the following symptoms: Frequent urination Get up at night to urinate   Review of Systems  Gastrointestinal (upper)  : Negative for upper GI symptoms  Gastrointestinal (lower) : Negative for lower GI symptoms  Constitutional : Negative for symptoms  Skin: Negative for skin symptoms  Eyes: Negative for eye symptoms  Ear/Nose/Throat : Sinus problems  Hematologic/Lymphatic: Negative for Hematologic/Lymphatic symptoms  Cardiovascular : Negative for cardiovascular symptoms  Respiratory : Negative for respiratory symptoms  Endocrine: Negative for endocrine symptoms  Musculoskeletal: Back pain  Neurological: Negative for neurological symptoms  Psychologic: Negative for psychiatric symptoms

## 2021-09-23 NOTE — Progress Notes (Signed)
09/23/2021 10:18 AM   Richard Webb [birth year] [id number]  Referring provider: [name], MD [place],  [place] [id number]  Followup BPh and erectile dysfunction   HPI: Richard Webb is a 76yo here for followup for BPH and erectile dysfunction.  IPSS 5 QOl 1 on finasteride 22m daily. N gross hematuria since last visit.  He has urinary frequency every 2-3 hours. Nocturia 1-2x. He is here today to discuss treatment for erectile dysfunction. He was previously on a nitrate and could not take PDE5s. He has good exercise tolerance. He has issues getting an erection and he cannot maintain the erection.,    PMH: Past Medical History:  Diagnosis Date   Arthritis    Asthma    Coronary atherosclerosis of native coronary artery    a. DES x 3 RCA and DES mCx 10/11 - [place] - with residual 50-70% stenosis in the distal Cx in AV groove, 70% distal LAD beyond apex, 70% small D1. b. 12/2017: similar results with multivessel CAD. Patent stents. Medical management recommended.  c. s/p NSTEMI in 10/2020 with DES to prox-LAD   Diverticulitis    GERD (gastroesophageal reflux disease)    History of kidney stones    Hypertension    Mixed hyperlipidemia    Myocardial infarction ([facility]    Obstructive sleep apnea    Pulmonary embolus ([facility]    February 2022   Pulmonary hypertension (HCC)    PASP 50 mmHg   Restless leg syndrome    Sarcoidosis    Statin intolerance    Ulcerative colitis ([place]     Surgical History: Past Surgical History:  Procedure Laterality Date   Ankle fracture repair Left 1985   APPENDECTOMY     CARDIAC CATHETERIZATION     stents x4   CHOLECYSTECTOMY  1970   CORONARY STENT INTERVENTION N/A 10/19/2020   Procedure: CORONARY STENT INTERVENTION;  Surgeon: KTroy Sine MD;  [place] [place];  Service: Cardiovascular;  Laterality: N/A;   CYSTOSCOPY N/A 04/22/2021   Procedure: CYSTOSCOPY WITHE FIREFLY INJECTIONS;  Surgeon: WIrine Seal MD;  [facility];  Service: Urology;  Laterality: N/A;   CYSTOSCOPY WITH INSERTION OF UROLIFT N/A 08/27/2017   Procedure: CYSTOSCOPY WITH INSERTION OF UROLIFT;  Surgeon: MCleon Gustin MD;  Location: [facility];  Service: Urology;  Laterality: N/A;   JOINT REPLACEMENT     LAPAROSCOPIC APPENDECTOMY  2013   LEFT HEART CATH AND CORONARY ANGIOGRAPHY N/A 01/02/2018   Procedure: LEFT HEART CATH AND CORONARY ANGIOGRAPHY;  Surgeon: KTroy Sine MD;  [facility];  Service: Cardiovascular;  Laterality: N/A;   LEFT HEART CATH AND CORONARY ANGIOGRAPHY N/A 10/19/2020   Procedure: LEFT HEART CATH AND CORONARY ANGIOGRAPHY;  Surgeon: KTroy Sine MD;  [place];  Service: Cardiovascular;  Laterality: N/A;   Right total knee replacement Bilateral 2005    Home Medications:  Allergies as of 09/23/2021       Reactions   Bee Venom Anaphylaxis   Statins Other (See Comments)   Myalgias - Simvastatin and Rosuvastatin        Medication List        Accurate as of September 23, 2021 10:18 AM. If you have any questions, ask your nurse or doctor.          acetaminophen 500 MG tablet Commonly known as: TYLENOL Take 1,000 mg by mouth 2 (two) times daily as needed for moderate pain.   albuterol 108 (  90 Base) MCG/ACT inhaler Commonly known as: VENTOLIN HFA SMARTSIG:2 Puff(s) By Mouth Every 4-6 Hours PRN   Breo Ellipta 200-25 MCG/ACT Aepb Generic drug: fluticasone furoate-vilanterol Inhale 1 Inhaler into the lungs daily.   carbidopa-levodopa 10-100 MG tablet Commonly known as: SINEMET IR Take 1 tablet by mouth at bedtime.   clopidogrel 75 MG tablet Commonly known as: PLAVIX Take 1 tablet (75 mg total) by mouth daily. Stop end of March 2023 and start aspirin 81 mg daily   cyanocobalamin 1000 MCG/ML injection Commonly known as: (VITAMIN B-12) Inject 1,000 mcg into the muscle every 30 (thirty) days.   Dupilumab 300 MG/2ML Sopn Inject 300 mg into the skin every 14  (fourteen) days.   finasteride 5 MG tablet Commonly known as: PROSCAR Take 1 tablet (5 mg total) by mouth daily.   folic acid 1 MG tablet Commonly known as: FOLVITE Take 1 mg by mouth See admin instructions. Mon - Fri   HYDROcodone-acetaminophen 5-325 MG tablet Commonly known as: NORCO/VICODIN Take 1-2 tablets by mouth every 6 (six) hours as needed for severe pain.   losartan 50 MG tablet Commonly known as: COZAAR Take 1 tablet (50 mg total) by mouth daily.   metoprolol tartrate 25 MG tablet Commonly known as: LOPRESSOR Take 12.5 mg by mouth 2 (two) times daily.   montelukast 10 MG tablet Commonly known as: SINGULAIR Take 10 mg by mouth at bedtime.   omeprazole 40 MG capsule Commonly known as: PRILOSEC Take 40 mg by mouth daily.   omeprazole 20 MG capsule Commonly known as: PRILOSEC Take 20 mg by mouth daily.   oxymetazoline 0.05 % nasal spray Commonly known as: AFRIN Place 1 spray into both nostrils 2 (two) times daily as needed for congestion.   Repatha SureClick 536 MG/ML Soaj Generic drug: Evolocumab INJECT CONTENTS OF 1 PEN UNDER THE SKIN EVERY 14 DAYS   sulfaSALAzine 500 MG tablet Commonly known as: AZULFIDINE Take 1,000 mg by mouth 2 (two) times daily.   temazepam 30 MG capsule Commonly known as: RESTORIL Take 30 mg by mouth at bedtime.   Vitamin D (Ergocalciferol) 1.25 MG (50000 UNIT) Caps capsule Commonly known as: DRISDOL Take 50,000 Units by mouth every 7 (seven) days. Thursdays        Allergies:  Allergies  Allergen Reactions   Bee Venom Anaphylaxis   Statins Other (See Comments)    Myalgias - Simvastatin and Rosuvastatin    Family History: Family History  Problem Relation Age of Onset   Colon cancer Mother    CAD Father     Social History:  reports that he has never smoked. He has never used smokeless tobacco. He reports that he does not drink alcohol and does not use drugs.  ROS: All other review of systems were reviewed and  are negative except what is noted above in HPI  Physical Exam: BP (!) 148/65    Pulse 74   Constitutional:  Alert and oriented, No acute distress. HEENT: [place] AT, moist mucus membranes.  Trachea midline, no masses. Cardiovascular: No clubbing, cyanosis, or edema. Respiratory: Normal respiratory effort, no increased work of breathing. GI: Abdomen is soft, nontender, nondistended, no abdominal masses GU: No CVA tenderness.  Lymph: No cervical or inguinal lymphadenopathy. Skin: No rashes, bruises or suspicious lesions. Neurologic: Grossly intact, no focal deficits, moving all 4 extremities. Psychiatric: Normal mood and affect.  Laboratory Data: Lab Results  Component Value Date   WBC 10.4 04/25/2021   HGB 8.2 (L) 04/25/2021   HCT  26.4 (L) 04/25/2021   MCV 89.8 04/25/2021   PLT 205 04/25/2021    Lab Results  Component Value Date   CREATININE 0.87 04/25/2021    No results found for: PSA  No results found for: TESTOSTERONE  Lab Results  Component Value Date   HGBA1C 4.9 04/11/2021    Urinalysis    Component Value Date/Time   COLORURINE AMBER (A) 01/24/2021 0957   APPEARANCEUR Clear 07/18/2021 1327   LABSPEC 1.004 (L) 01/24/2021 0957   PHURINE 7.0 01/24/2021 0957   GLUCOSEU Negative 07/18/2021 1327   HGBUR LARGE (A) 01/24/2021 0957   BILIRUBINUR Negative 07/18/2021 [place] 01/24/2021 0957   PROTEINUR Negative 07/18/2021 1327   PROTEINUR NEGATIVE 01/24/2021 0957   NITRITE Negative 07/18/2021 1327   NITRITE NEGATIVE 01/24/2021 0957   LEUKOCYTESUR Negative 07/18/2021 1327   LEUKOCYTESUR LARGE (A) 01/24/2021 0957    Lab Results  Component Value Date   LABMICR Comment 07/18/2021   WBCUA None seen 05/16/2021   LABEPIT None seen 05/16/2021   MUCUS Present 05/16/2021   BACTERIA None seen 05/16/2021    Pertinent Imaging:  No results found for this or any previous visit.  No results found for this or any previous visit.  No results found for  this or any previous visit.  No results found for this or any previous visit.  No results found for this or any previous visit.  No results found for this or any previous visit.  Results for orders placed during the hospital encounter of 04/09/20  CT HEMATURIA WORKUP  Narrative CLINICAL DATA:  Gross hematuria with lower abdominal and groin pain  EXAM: CT ABDOMEN AND PELVIS WITHOUT AND WITH CONTRAST  TECHNIQUE: Multidetector CT imaging of the abdomen and pelvis was performed following the standard protocol before and following the bolus administration of intravenous contrast.  CONTRAST:  140m OMNIPAQUE IOHEXOL 300 MG/ML  SOLN  COMPARISON:  Abdomen and pelvis CT from 2017  FINDINGS: Lower chest: 9 x 7 mm part solid pulmonary nodule (image 12, series 4) septal thickening at the lung bases as well with some subpleural reticulation. Concern in the LEFT lung base on the prior exam measuring approximately 5-6 mm.  Mild septal thickening also present at the RIGHT lung base with parenchymal scarring as slightly worse than the previous exam.  On prone imaging there is persistent subpleural reticulation and there is some nodularity that is more evident (image 41, series 15) 5 mm juxta diaphragmatic pulmonary nodule. No consolidation or sign of pleural effusion. Multiple small nodules are seen at the RIGHT lung base as well, for instance on image 14 of series 15 there are at least 5/6 additional small nodules, a 4 mm nodule in the RIGHT lung base serving as an example of multiple small nodules.  Hepatobiliary: Mildly nodular hepatic contours with fissural widening. Portal vein is patent liver contours are unchanged since 2017. No focal, suspicious hepatic lesion. Post cholecystectomy without gross biliary duct distension.  Pancreas: Pancreas without focal lesion or ductal dilation. No signs of peripancreatic inflammation.  Spleen: Spleen normal in size and contour with no  focal lesion. Small adjacent splenule.  Perisplenic collaterals in the LEFT hemiabdomen. Splenic vein remains patent as does the SMV.  Adrenals/Urinary Tract: Adrenal glands are normal.  Bilateral renal cysts, largest on the RIGHT approximately 4.5 x 3.4 cm. No hydronephrosis. No ureteral calculus.  Small lower pole calculus on the LEFT. This measures approximately 3 mm.  Mild perivesical stranding. Bladder wall thickening  with adjacent diverticular inflammation  Large inflamed diverticulum versus small pericolonic abscess from diverticulitis sits atop the urinary bladder. (Image 71 series 7) and image 52, series 11) there is colonic thickening as well in this area.  No sign of upper tract lesion.  Stomach/Bowel: Small hiatal hernia. No signs of small bowel obstruction or acute small bowel process. Post appendectomy.  Segmental colonic thickening more pronounced adjacent to pericolonic fluid and gas that abuts the urinary bladder and tracks along the dome of the bladder. This area measures approximately 2.3 x 1.9 cm. No gas in the urinary bladder at this time.  Extending superiorly from the sigmoid colon is a gas containing tract best seen on image 71 of series 12. There is a diverticulum in this area on the previous study but this appears more pronounced and is associated with fluid density that extends superiorly from the colon, adjacent to a wide-mouth diverticulum. There is no pericolonic inflammation in this location. The area extending from the gas containing tract abuts small bowel loops measuring approximately 2.1 x 1.4 cm.  Vascular/Lymphatic: Calcified atheromatous plaque in the abdominal aorta, no aneurysm. No adenopathy in the retroperitoneum. Small lymph nodes in the upper abdomen are unchanged.  No pelvic lymphadenopathy.  Reproductive: Post uro lift procedure.  Post RIGHT orchiectomy.  Other: No free air.  No ascites.  Musculoskeletal: No acute  musculoskeletal process. No destructive bone finding. Spinal degenerative changes.  IMPRESSION: 1. Pericolonic abscess likely related to diverticulitis between the sigmoid and the urinary bladder with thickening of the wall of the urinary bladder, likely secondary cystitis. No current signs of colovesical fistula though with this appearance fistula formation is a risk. 2. Eccentric thickening of the inferior wall of the colon is noted, potentially related to more pronounced inflammation in this location would however suggest follow-up colonoscopy to exclude underlying colonic lesion. 3. Small gas containing tract extending superiorly from the sigmoid slightly downstream from this area of abnormality was present previously but appears slightly larger than on the prior study. Significance uncertain. Potentially related to prior diverticulitis. 4. No free air or ascites. 5. No signs of upper tract lesion with renal cortical scarring, nephrolithiasis and renal cysts. 6. 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 Correlate with any clinical or laboratory evidence of liver disease. 7. Multiple small pulmonary nodules at the lung bases with septal thickening and subpleural reticulation. Correlate with any risk factors for aspiration or history of chronic infection. Given the 8 mm mean diameter pulmonary nodule which has enlarged since prior imaging consider dedicated chest CT and/or comparison with prior evaluations. Based on spiculated appearance scarring or bronchogenic neoplasm could have this appearance. 8. Aortic atherosclerosis.  Aortic Atherosclerosis (ICD10-I70.0).   Electronically Signed By: Zetta Bills M.D. On: 04/10/2020 16:57  No results found for this or any previous visit.   Assessment & Plan:    1. Benign prostatic hyperplasia, unspecified whether lower urinary tract symptoms present -continue finasteride 77m  - Urinalysis,  Routine w reflex microscopic  2. OAB (overactive bladder) -Patient defers therapy at this time - Urinalysis, Routine w reflex microscopic  3. Gross hematuria -finasteride 564m  4. Erectile dysfunction due to arterial insufficiency We will trial tadalafil [id number]   No follow-ups on file.  PatNicolette BangD  [facility] [place]

## 2021-09-26 DIAGNOSIS — K219 Gastro-esophageal reflux disease without esophagitis: Secondary | ICD-10-CM | POA: Diagnosis not present

## 2021-09-26 DIAGNOSIS — K760 Fatty (change of) liver, not elsewhere classified: Secondary | ICD-10-CM | POA: Diagnosis not present

## 2021-09-26 DIAGNOSIS — K5150 Left sided colitis without complications: Secondary | ICD-10-CM | POA: Diagnosis not present

## 2021-09-26 DIAGNOSIS — R748 Abnormal levels of other serum enzymes: Secondary | ICD-10-CM | POA: Diagnosis not present

## 2021-09-28 ENCOUNTER — Telehealth: Payer: Self-pay

## 2021-09-28 NOTE — Telephone Encounter (Signed)
Patient called with intermittent hematuria- message sent to MD.

## 2021-09-29 NOTE — Telephone Encounter (Signed)
Appt scheduled with Sharee Pimple patient agreed to date/time

## 2021-09-30 ENCOUNTER — Ambulatory Visit (INDEPENDENT_AMBULATORY_CARE_PROVIDER_SITE_OTHER): Payer: Medicare Other | Admitting: Physician Assistant

## 2021-09-30 ENCOUNTER — Other Ambulatory Visit: Payer: Self-pay

## 2021-09-30 ENCOUNTER — Encounter: Payer: Self-pay | Admitting: Physician Assistant

## 2021-09-30 VITALS — BP 157/76 | HR 67 | Wt 212.0 lb

## 2021-09-30 DIAGNOSIS — R361 Hematospermia: Secondary | ICD-10-CM

## 2021-09-30 DIAGNOSIS — N400 Enlarged prostate without lower urinary tract symptoms: Secondary | ICD-10-CM | POA: Diagnosis not present

## 2021-09-30 DIAGNOSIS — R310 Gross hematuria: Secondary | ICD-10-CM

## 2021-09-30 DIAGNOSIS — R300 Dysuria: Secondary | ICD-10-CM

## 2021-09-30 DIAGNOSIS — N5201 Erectile dysfunction due to arterial insufficiency: Secondary | ICD-10-CM

## 2021-09-30 LAB — URINALYSIS, ROUTINE W REFLEX MICROSCOPIC
Bilirubin, UA: NEGATIVE
Glucose, UA: NEGATIVE
Ketones, UA: NEGATIVE
Leukocytes,UA: NEGATIVE
Nitrite, UA: NEGATIVE
Protein,UA: NEGATIVE
RBC, UA: NEGATIVE
Specific Gravity, UA: 1.01 (ref 1.005–1.030)
Urobilinogen, Ur: 0.2 mg/dL (ref 0.2–1.0)
pH, UA: 6 (ref 5.0–7.5)

## 2021-09-30 MED ORDER — SULFAMETHOXAZOLE-TRIMETHOPRIM 800-160 MG PO TABS: 1.0000 | ORAL_TABLET | Freq: Two times a day (BID) | ORAL | 0 refills | Status: DC

## 2021-09-30 NOTE — Progress Notes (Signed)
Assessment: 1. Gross hematuria   2. Benign prostatic hyperplasia, unspecified whether lower urinary tract symptoms present   3. Hematospermia   4. Erectile dysfunction due to arterial insufficiency   5. Dysuria     Plan: As the patient tolerated Bactrim well in the past.  Prescription for Bactrim DS twice daily for 21 days sent to his pharmacy.  He will increase his fluid intake and understands that his symptoms of gross hematuria may occur intermittently after driving to [place] and back for his upcoming wedding. ED precautions discussed and pt will keep FU as scheduled with Dr. Alyson Ingles.   Chief Complaint: Gross hematuria  HPI: Richard Webb is a 76 y.o. male who presents for evaluation of sudden onset return of gross hematuria with hematospermia since visit last week.  He also complains of some urinary burning. Pt on finasteride for BPH and gross hematuria. No fever, chills, NV, body aches. Pt reports similar c/o last November and was tx with antibx with resolution of sx. Pt gettting married next week and will be travelling to [place] and back. Long car rides usually cause some hematuria for him and he is concerned that he will get worse during his honeymoon  UA is clear today  Portions of the above documentation were copied from a prior visit for review purposes only.  Allergies: Allergies  Allergen Reactions   Bee Venom Anaphylaxis   Statins Other (See Comments)    Myalgias - Simvastatin and Rosuvastatin    PMH: Past Medical History:  Diagnosis Date   Arthritis    Asthma    Coronary atherosclerosis of native coronary artery    a. DES x 3 RCA and DES mCx 10/11 - [place] - with residual 50-70% stenosis in the distal Cx in AV groove, 70% distal LAD beyond apex, 70% small D1. b. 12/2017: similar results with multivessel CAD. Patent stents. Medical management recommended.  c. s/p NSTEMI in 10/2020 with DES to prox-LAD   Diverticulitis    GERD (gastroesophageal reflux disease)     History of kidney stones    Hypertension    Mixed hyperlipidemia    Myocardial infarction [facility])    Obstructive sleep apnea    Pulmonary embolus [facility])    February 2022   Pulmonary hypertension (HCC)    PASP 50 mmHg   Restless leg syndrome    Sarcoidosis    Statin intolerance    Ulcerative colitis ([place])     PSH: Past Surgical History:  Procedure Laterality Date   Ankle fracture repair Left 1985   APPENDECTOMY     CARDIAC CATHETERIZATION     stents x4   CHOLECYSTECTOMY  1970   CORONARY STENT INTERVENTION N/A 10/19/2020   Procedure: CORONARY STENT INTERVENTION;  Surgeon: Troy Sine, MD;  Location: [place] [facility];  Service: Cardiovascular;  Laterality: N/A;   CYSTOSCOPY N/A 04/22/2021   Procedure: CYSTOSCOPY WITHE FIREFLY INJECTIONS;  Surgeon: Irine Seal, MD;  [facility];  Service: Urology;  Laterality: N/A;   CYSTOSCOPY WITH INSERTION OF UROLIFT N/A 08/27/2017   Procedure: CYSTOSCOPY WITH INSERTION OF UROLIFT;  Surgeon: Cleon Gustin, MD;  Location: [facility];  Service: Urology;  Laterality: N/A;   JOINT REPLACEMENT     LAPAROSCOPIC APPENDECTOMY  2013   LEFT HEART CATH AND CORONARY ANGIOGRAPHY N/A 01/02/2018   Procedure: LEFT HEART CATH AND CORONARY ANGIOGRAPHY;  Surgeon: Troy Sine, MD;  Location: [place] [facility];  Service: Cardiovascular;  Laterality: N/A;   LEFT HEART CATH  AND CORONARY ANGIOGRAPHY N/A 10/19/2020   Procedure: LEFT HEART CATH AND CORONARY ANGIOGRAPHY;  Surgeon: Troy Sine, MD;  Location: [place] [facility];  Service: Cardiovascular;  Laterality: N/A;   Right total knee replacement Bilateral 2005    SH: Social History   Tobacco Use   Smoking status: Never   Smokeless tobacco: Never  Vaping Use   Vaping Use: Never used  Substance Use Topics   Alcohol use: No   Drug use: No    ROS: Constitutional:  Negative for fever, chills, weight loss CV: Negative for chest pain, previous MI, hypertension Respiratory:  Negative for  shortness of breath, wheezing, sleep apnea, frequent cough GI:  Negative for nausea, vomiting, bloody stool, GERD  PE: There were no vitals taken for this visit. GENERAL APPEARANCE:  Well appearing, well developed, well nourished, NAD HEENT:  Atraumatic, normocephalic NECK:  Supple. Trachea midline ABDOMEN:  Soft, non-tender, no masses EXTREMITIES:  Moves all extremities well, without clubbing, cyanosis, or edema NEUROLOGIC:  Alert and oriented x 3, normal gait, CN II-XII grossly intact MENTAL STATUS:  appropriate BACK:  Non-tender to palpation, No CVAT SKIN:  Warm, dry, and intact   Results: Laboratory Data: Lab Results  Component Value Date   WBC 10.4 04/25/2021   HGB 8.2 (L) 04/25/2021   HCT 26.4 (L) 04/25/2021   MCV 89.8 04/25/2021   PLT 205 04/25/2021    Lab Results  Component Value Date   CREATININE 0.87 04/25/2021    No results found for: PSA  No results found for: TESTOSTERONE  Lab Results  Component Value Date   HGBA1C 4.9 04/11/2021    Urinalysis    Component Value Date/Time   COLORURINE AMBER (A) 01/24/2021 0957   APPEARANCEUR Clear 09/23/2021 1032   LABSPEC 1.004 (L) 01/24/2021 0957   PHURINE 7.0 01/24/2021 0957   GLUCOSEU Negative 09/23/2021 1032   HGBUR LARGE (A) 01/24/2021 0957   BILIRUBINUR Negative 09/23/2021 [place] 01/24/2021 0957   PROTEINUR Negative 09/23/2021 1032   PROTEINUR NEGATIVE 01/24/2021 0957   NITRITE Negative 09/23/2021 1032   NITRITE NEGATIVE 01/24/2021 0957   LEUKOCYTESUR Negative 09/23/2021 1032   LEUKOCYTESUR LARGE (A) 01/24/2021 0957    Lab Results  Component Value Date   LABMICR See below: 09/23/2021   WBCUA None seen 09/23/2021   LABEPIT None seen 09/23/2021   MUCUS Present 05/16/2021   BACTERIA None seen 09/23/2021    Pertinent Imaging:  No results found for this or any previous visit.  No results found for this or any previous visit.  No results found for this or any previous  visit.  No results found for this or any previous visit.  No results found for this or any previous visit.  No results found for this or any previous visit.  Results for orders placed during the hospital encounter of 04/09/20  CT HEMATURIA WORKUP  Narrative CLINICAL DATA:  Gross hematuria with lower abdominal and groin pain  EXAM: CT ABDOMEN AND PELVIS WITHOUT AND WITH CONTRAST  TECHNIQUE: Multidetector CT imaging of the abdomen and pelvis was performed following the standard protocol before and following the bolus administration of intravenous contrast.  CONTRAST:  163m OMNIPAQUE IOHEXOL 300 MG/ML  SOLN  COMPARISON:  Abdomen and pelvis CT from 2017  FINDINGS: Lower chest: 9 x 7 mm part solid pulmonary nodule (image 12, series 4) septal thickening at the lung bases as well with some subpleural reticulation. Concern in the LEFT lung base on the prior  exam measuring approximately 5-6 mm.  Mild septal thickening also present at the RIGHT lung base with parenchymal scarring as slightly worse than the previous exam.  On prone imaging there is persistent subpleural reticulation and there is some nodularity that is more evident (image 41, series 15) 5 mm juxta diaphragmatic pulmonary nodule. No consolidation or sign of pleural effusion. Multiple small nodules are seen at the RIGHT lung base as well, for instance on image 14 of series 15 there are at least 5/6 additional small nodules, a 4 mm nodule in the RIGHT lung base serving as an example of multiple small nodules.  Hepatobiliary: Mildly nodular hepatic contours with fissural widening. Portal vein is patent liver contours are unchanged since 2017. No focal, suspicious hepatic lesion. Post cholecystectomy without gross biliary duct distension.  Pancreas: Pancreas without focal lesion or ductal dilation. No signs of peripancreatic inflammation.  Spleen: Spleen normal in size and contour with no focal lesion. Small  adjacent splenule.  Perisplenic collaterals in the LEFT hemiabdomen. Splenic vein remains patent as does the SMV.  Adrenals/Urinary Tract: Adrenal glands are normal.  Bilateral renal cysts, largest on the RIGHT approximately 4.5 x 3.4 cm. No hydronephrosis. No ureteral calculus.  Small lower pole calculus on the LEFT. This measures approximately 3 mm.  Mild perivesical stranding. Bladder wall thickening with adjacent diverticular inflammation  Large inflamed diverticulum versus small pericolonic abscess from diverticulitis sits atop the urinary bladder. (Image 71 series 7) and image 52, series 11) there is colonic thickening as well in this area.  No sign of upper tract lesion.  Stomach/Bowel: Small hiatal hernia. No signs of small bowel obstruction or acute small bowel process. Post appendectomy.  Segmental colonic thickening more pronounced adjacent to pericolonic fluid and gas that abuts the urinary bladder and tracks along the dome of the bladder. This area measures approximately 2.3 x 1.9 cm. No gas in the urinary bladder at this time.  Extending superiorly from the sigmoid colon is a gas containing tract best seen on image 71 of series 12. There is a diverticulum in this area on the previous study but this appears more pronounced and is associated with fluid density that extends superiorly from the colon, adjacent to a wide-mouth diverticulum. There is no pericolonic inflammation in this location. The area extending from the gas containing tract abuts small bowel loops measuring approximately 2.1 x 1.4 cm.  Vascular/Lymphatic: Calcified atheromatous plaque in the abdominal aorta, no aneurysm. No adenopathy in the retroperitoneum. Small lymph nodes in the upper abdomen are unchanged.  No pelvic lymphadenopathy.  Reproductive: Post uro lift procedure.  Post RIGHT orchiectomy.  Other: No free air.  No ascites.  Musculoskeletal: No acute musculoskeletal process.  No destructive bone finding. Spinal degenerative changes.  IMPRESSION: 1. Pericolonic abscess likely related to diverticulitis between the sigmoid and the urinary bladder with thickening of the wall of the urinary bladder, likely secondary cystitis. No current signs of colovesical fistula though with this appearance fistula formation is a risk. 2. Eccentric thickening of the inferior wall of the colon is noted, potentially related to more pronounced inflammation in this location would however suggest follow-up colonoscopy to exclude underlying colonic lesion. 3. Small gas containing tract extending superiorly from the sigmoid slightly downstream from this area of abnormality was present previously but appears slightly larger than on the prior study. Significance uncertain. Potentially related to prior diverticulitis. 4. No free air or ascites. 5. No signs of upper tract lesion with renal cortical scarring, nephrolithiasis and  renal cysts. 6. 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 Correlate with any clinical or laboratory evidence of liver disease. 7. Multiple small pulmonary nodules at the lung bases with septal thickening and subpleural reticulation. Correlate with any risk factors for aspiration or history of chronic infection. Given the 8 mm mean diameter pulmonary nodule which has enlarged since prior imaging consider dedicated chest CT and/or comparison with prior evaluations. Based on spiculated appearance scarring or bronchogenic neoplasm could have this appearance. 8. Aortic atherosclerosis.  Aortic Atherosclerosis (ICD10-I70.0).   Electronically Signed By: Zetta Bills M.D. On: 04/10/2020 16:57  No results found for this or any previous visit.  No results found for this or any previous visit (from the past 24 hour(s)).

## 2021-10-04 ENCOUNTER — Encounter: Payer: Self-pay | Admitting: Urology

## 2021-10-04 NOTE — Patient Instructions (Signed)

## 2021-10-18 ENCOUNTER — Ambulatory Visit: Payer: Medicare Other | Admitting: Urology

## 2021-10-22 ENCOUNTER — Other Ambulatory Visit: Payer: Self-pay | Admitting: Internal Medicine

## 2021-10-22 DIAGNOSIS — I214 Non-ST elevation (NSTEMI) myocardial infarction: Secondary | ICD-10-CM

## 2021-10-22 DIAGNOSIS — I2510 Atherosclerotic heart disease of native coronary artery without angina pectoris: Secondary | ICD-10-CM

## 2021-10-25 DIAGNOSIS — L281 Prurigo nodularis: Secondary | ICD-10-CM | POA: Diagnosis not present

## 2021-10-25 DIAGNOSIS — L820 Inflamed seborrheic keratosis: Secondary | ICD-10-CM | POA: Diagnosis not present

## 2021-10-25 DIAGNOSIS — L821 Other seborrheic keratosis: Secondary | ICD-10-CM | POA: Diagnosis not present

## 2021-10-25 DIAGNOSIS — Z79899 Other long term (current) drug therapy: Secondary | ICD-10-CM | POA: Diagnosis not present

## 2021-10-25 DIAGNOSIS — L570 Actinic keratosis: Secondary | ICD-10-CM | POA: Diagnosis not present

## 2021-10-25 DIAGNOSIS — D1801 Hemangioma of skin and subcutaneous tissue: Secondary | ICD-10-CM | POA: Diagnosis not present

## 2021-10-25 DIAGNOSIS — L299 Pruritus, unspecified: Secondary | ICD-10-CM | POA: Diagnosis not present

## 2021-11-08 ENCOUNTER — Other Ambulatory Visit: Payer: Self-pay | Admitting: Cardiology

## 2021-11-18 DIAGNOSIS — J45909 Unspecified asthma, uncomplicated: Secondary | ICD-10-CM | POA: Diagnosis not present

## 2021-11-18 DIAGNOSIS — G4733 Obstructive sleep apnea (adult) (pediatric): Secondary | ICD-10-CM | POA: Diagnosis not present

## 2021-11-22 DIAGNOSIS — M546 Pain in thoracic spine: Secondary | ICD-10-CM | POA: Diagnosis not present

## 2021-11-22 DIAGNOSIS — M5416 Radiculopathy, lumbar region: Secondary | ICD-10-CM | POA: Diagnosis not present

## 2021-11-23 DIAGNOSIS — M79644 Pain in right finger(s): Secondary | ICD-10-CM | POA: Diagnosis not present

## 2021-11-23 DIAGNOSIS — M19041 Primary osteoarthritis, right hand: Secondary | ICD-10-CM | POA: Diagnosis not present

## 2021-11-23 DIAGNOSIS — M65331 Trigger finger, right middle finger: Secondary | ICD-10-CM | POA: Diagnosis not present

## 2021-12-06 DIAGNOSIS — M5416 Radiculopathy, lumbar region: Secondary | ICD-10-CM | POA: Diagnosis not present

## 2021-12-06 DIAGNOSIS — M545 Low back pain, unspecified: Secondary | ICD-10-CM | POA: Diagnosis not present

## 2021-12-07 NOTE — Progress Notes (Signed)
? ? ?Cardiology Office Note ? ?Date: 12/08/2021  ? ?ID: Richard Webb, DOB 28-Sep-1945, MRN [id number] ? ?PCP:  [name], MD  ?Cardiologist:  Rozann Lesches, MD ?Electrophysiologist:  None  ? ?Chief Complaint  ?Patient presents with  ? Cardiac follow-up  ? ? ?History of Present Illness: ?Richard Webb is a 76 y.o. male last seen in January.  He is here today for a follow-up visit with his wife, they are recently married, and as we discussed at our last visit he ultimately plans to move down to [place] in June.  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 ? ?He does not describe any angina symptoms, no palpitations.  Has had some ankle edema in the evenings intermittently.  We did discuss his diet and salt intake.  Weight is up about 4 pounds. ? ?He was taken off Imdur at the last visit.  He also stopped Plavix at the end of March.  The remainder of his cardiac regimen is stable and reviewed below.  He has done very well on Repatha with last LDL 29. ? ?Past Medical History:  ?Diagnosis Date  ? Arthritis   ? Asthma   ? Coronary atherosclerosis of native coronary artery   ? a. DES x 3 RCA and DES mCx 10/11 - [place] - with residual 50-70% stenosis in the distal Cx in AV groove, 70% distal LAD beyond apex, 70% small D1. b. 12/2017: similar results with multivessel CAD. Patent stents. Medical management recommended.  c. s/p NSTEMI in 10/2020 with DES to prox-LAD  ? Diverticulitis   ? GERD (gastroesophageal reflux disease)   ? History of kidney stones   ? Hypertension   ? Mixed hyperlipidemia   ? Myocardial infarction [facility])   ? Obstructive sleep apnea   ? Pulmonary embolus ([place])   ? February 2022  ? Pulmonary hypertension ([place])   ? PASP 50 mmHg  ? Restless leg syndrome   ? Sarcoidosis   ? Statin intolerance   ? Ulcerative colitis ([place])   ? ? ?Past Surgical History:  ?Procedure Laterality Date  ? Ankle fracture repair Left 1985  ? APPENDECTOMY    ? CARDIAC CATHETERIZATION    ? stents x4  ?  CHOLECYSTECTOMY  1970  ? CORONARY STENT INTERVENTION N/A 10/19/2020  ? Procedure: CORONARY STENT INTERVENTION;  Surgeon: Troy Sine, MD;  Location: [place] [facility];  Service: Cardiovascular;  Laterality: N/A;  ? CYSTOSCOPY N/A 04/22/2021  ? Procedure: CYSTOSCOPY WITHE FIREFLY INJECTIONS;  Surgeon: Irine Seal, MD;  [facility];  Service: Urology;  Laterality: N/A;  ? CYSTOSCOPY WITH INSERTION OF UROLIFT N/A 08/27/2017  ? Procedure: CYSTOSCOPY WITH INSERTION OF UROLIFT;  Surgeon: Cleon Gustin, MD;  [facility];  Service: Urology;  Laterality: N/A;  ? JOINT REPLACEMENT    ? LAPAROSCOPIC APPENDECTOMY  2013  ? LEFT HEART CATH AND CORONARY ANGIOGRAPHY N/A 01/02/2018  ? Procedure: LEFT HEART CATH AND CORONARY ANGIOGRAPHY;  Surgeon: Troy Sine, MD;  Location: [place] [facility];  Service: Cardiovascular;  Laterality: N/A;  ? LEFT HEART CATH AND CORONARY ANGIOGRAPHY N/A 10/19/2020  ? Procedure: LEFT HEART CATH AND CORONARY ANGIOGRAPHY;  Surgeon: Troy Sine, MD;  Location: [place] [facility];  Service: Cardiovascular;  Laterality: N/A;  ? Right total knee replacement Bilateral 2005  ? ? ?Current Outpatient Medications  ?Medication Sig Dispense Refill  ? acetaminophen (TYLENOL) 500 MG tablet Take 1,000 mg by mouth 2 (two) times daily as needed  for moderate pain.    ? albuterol (VENTOLIN HFA) 108 (90 Base) MCG/ACT inhaler SMARTSIG:2 Puff(s) By Mouth Every 4-6 Hours PRN    ? aspirin EC 81 MG tablet Take 81 mg by mouth daily.    ? BREO ELLIPTA 200-25 MCG/INH AEPB Inhale 1 Inhaler into the lungs daily.  3  ? carbidopa-levodopa (SINEMET IR) 10-100 MG per tablet Take 1 tablet by mouth at bedtime.    ? cyanocobalamin (,VITAMIN B-12,) 1000 MCG/ML injection Inject 1,000 mcg into the muscle every 30 (thirty) days.    ? Dupilumab 300 MG/2ML SOPN Inject 300 mg into the skin every 14 (fourteen) days.    ? finasteride (PROSCAR) 5 MG tablet Take 1 tablet (5 mg total) by mouth daily. 90 tablet 3  ? folic acid  (FOLVITE) 1 MG tablet Take 1 mg by mouth See admin instructions. Mon - Fri    ? HYDROcodone-acetaminophen (NORCO/VICODIN) 5-325 MG tablet Take 1-2 tablets by mouth every 6 (six) hours as needed for severe pain. 30 tablet 0  ? losartan (COZAAR) 50 MG tablet Take 1 tablet (50 mg total) by mouth daily. 90 tablet 1  ? metoprolol tartrate (LOPRESSOR) 25 MG tablet Take 12.5 mg by mouth 2 (two) times daily.    ? montelukast (SINGULAIR) 10 MG tablet Take 10 mg by mouth at bedtime.    ? omeprazole (PRILOSEC) 20 MG capsule Take 20 mg by mouth daily.    ? oxymetazoline (AFRIN) 0.05 % nasal spray Place 1 spray into both nostrils 2 (two) times daily as needed for congestion.    ? REPATHA SURECLICK 076 MG/ML SOAJ INJECT 1 DOSE INTO THE SKIN EVERY 14 DAYS. &lt;PLEASE MAKE APPOINTMENT FOR REFILLS&gt; 6 mL 3  ? sulfaSALAzine (AZULFIDINE) 500 MG tablet Take 1,000 mg by mouth 2 (two) times daily.    ? tadalafil (CIALIS) 20 MG tablet Take 1 tablet (20 mg total) by mouth daily as needed. 10 tablet 5  ? tiZANidine (ZANAFLEX) 4 MG tablet Take 4 mg by mouth at bedtime.    ? Vitamin D, Ergocalciferol, (DRISDOL) 50000 UNITS CAPS capsule Take 50,000 Units by mouth every 7 (seven) days. Thursdays    ? ?No current facility-administered medications for this visit.  ? ?Allergies:  Bee venom and Statins  ? ?ROS: No orthopnea or PND.  No syncope. ? ?Physical Exam: ?VS:  BP 118/68   Pulse (!) 52   Ht 6' (1.829 m)   Wt 216 lb 6.4 oz (98.2 kg)   SpO2 94%   BMI 29.35 kg/m? , BMI Body mass index is 29.35 kg/m?. ? ?Wt Readings from Last 3 Encounters:  ?12/08/21 216 lb 6.4 oz (98.2 kg)  ?09/30/21 212 lb (96.2 kg)  ?09/08/21 212 lb (96.2 kg)  ?  ?General: Patient appears comfortable at rest. ?HEENT: Conjunctiva and lids normal. ?Neck: Supple, no elevated JVP or carotid bruits, no thyromegaly. ?Lungs: Clear to auscultation, nonlabored breathing at rest. ?Cardiac: Regular rate and rhythm, no S3, 1/6 systolic murmur, no pericardial rub. ?Extremities: No  pitting edema. ? ?ECG:  An ECG dated 01/24/2021 was personally reviewed today and demonstrated:  Sinus rhythm with prolonged PR interval and IVCD. ? ?Recent Labwork: ?03/31/2021: ALT 12; AST 19 ?04/24/2021: Magnesium 2.0 ?04/25/2021: BUN 11; Creatinine, Ser 0.87; Hemoglobin 8.2; Platelets 205; Potassium 3.4; Sodium 138  ?   ?Component Value Date/Time  ? CHOL 91 03/31/2021 1128  ? CHOL 78 (L) 12/12/2019 0910  ? TRIG 94 03/31/2021 1128  ? HDL 43 03/31/2021 1128  ?  HDL 42 12/12/2019 0910  ? CHOLHDL 2.1 03/31/2021 1128  ? VLDL 19 03/31/2021 1128  ? [place] 29 03/31/2021 1128  ? [place] 19 12/12/2019 0910  ? ? ?Other Studies Reviewed Today: ? ?Echocardiogram 10/17/2020: ? 1. Left ventricular ejection fraction, by estimation, is 70 to 75%. The  ?left ventricle has hyperdynamic function. The left ventricle has no  ?regional wall motion abnormalities. There is mild left ventricular  ?hypertrophy of the basal-septal segment. Left  ?ventricular diastolic parameters are consistent with Grade I diastolic  ?dysfunction (impaired relaxation).  ? 2. Right ventricular systolic function is normal. The right ventricular  ?size is normal.  ? 3. The mitral valve is normal in structure. Trivial mitral valve  ?regurgitation. No evidence of mitral stenosis.  ? 4. The aortic valve is tricuspid. Aortic valve regurgitation is not  ?visualized. Mild aortic valve sclerosis is present, with no evidence of  ?aortic valve stenosis.  ? ?Assessment and Plan: ? ?1.  CAD status post DES x3 to the RCA and DES to the circumflex in 2011, more recently NSTEMI in March 2022 with DES to the proximal LAD at that time.  He has been off Plavix since the end of March.  No active angina at this time on medical therapy.  Continue aspirin, Repatha, Lopressor, and Cozaar.  He will be moving to [place] in June, plans to establish with a cardiologist in that area.  We can certainly see him back down the road if needed. ? ?2.  Mixed hyperlipidemia with statin intolerance  related myalgias.  He is doing very well on Repatha, LDL down to 29. ? ?3.  Essential hypertension, blood pressure is well controlled today on current regimen including Cozaar and Lopressor.  No changes were made. ? ?Med

## 2021-12-08 ENCOUNTER — Encounter: Payer: Self-pay | Admitting: Cardiology

## 2021-12-08 ENCOUNTER — Ambulatory Visit (INDEPENDENT_AMBULATORY_CARE_PROVIDER_SITE_OTHER): Payer: Medicare Other | Admitting: Cardiology

## 2021-12-08 VITALS — BP 118/68 | HR 52 | Ht 72.0 in | Wt 216.4 lb

## 2021-12-08 DIAGNOSIS — E782 Mixed hyperlipidemia: Secondary | ICD-10-CM

## 2021-12-08 DIAGNOSIS — I10 Essential (primary) hypertension: Secondary | ICD-10-CM | POA: Diagnosis not present

## 2021-12-08 DIAGNOSIS — T466X5S Adverse effect of antihyperlipidemic and antiarteriosclerotic drugs, sequela: Secondary | ICD-10-CM

## 2021-12-08 DIAGNOSIS — M791 Myalgia, unspecified site: Secondary | ICD-10-CM | POA: Diagnosis not present

## 2021-12-08 DIAGNOSIS — I25119 Atherosclerotic heart disease of native coronary artery with unspecified angina pectoris: Secondary | ICD-10-CM

## 2021-12-08 NOTE — Patient Instructions (Addendum)
Medication Instructions:  ?Your physician recommends that you continue on your current medications as directed. Please refer to the Current Medication list given to you today. ? ?Labwork: ?none ? ?Testing/Procedures: ?none ? ?Follow-Up: ?Your physician recommends that you schedule a follow-up appointment in: as needed ?Moving to [place]-we can send records upon request from your new provider ? ?Any Other Special Instructions Will Be Listed Below (If Applicable). ? ?If you need a refill on your cardiac medications before your next appointment, please call your pharmacy. ?

## 2021-12-11 ENCOUNTER — Other Ambulatory Visit: Payer: Self-pay | Admitting: Cardiology

## 2021-12-16 DIAGNOSIS — I10 Essential (primary) hypertension: Secondary | ICD-10-CM | POA: Diagnosis not present

## 2021-12-16 DIAGNOSIS — Z789 Other specified health status: Secondary | ICD-10-CM | POA: Diagnosis not present

## 2021-12-16 DIAGNOSIS — Z299 Encounter for prophylactic measures, unspecified: Secondary | ICD-10-CM | POA: Diagnosis not present

## 2021-12-16 DIAGNOSIS — Z6828 Body mass index (BMI) 28.0-28.9, adult: Secondary | ICD-10-CM | POA: Diagnosis not present

## 2021-12-16 DIAGNOSIS — Z713 Dietary counseling and surveillance: Secondary | ICD-10-CM | POA: Diagnosis not present

## 2021-12-27 ENCOUNTER — Ambulatory Visit (INDEPENDENT_AMBULATORY_CARE_PROVIDER_SITE_OTHER): Payer: Medicare Other | Admitting: Urology

## 2021-12-27 ENCOUNTER — Other Ambulatory Visit: Payer: Self-pay | Admitting: Urology

## 2021-12-27 VITALS — BP 136/59 | HR 65

## 2021-12-27 DIAGNOSIS — I25119 Atherosclerotic heart disease of native coronary artery with unspecified angina pectoris: Secondary | ICD-10-CM | POA: Diagnosis not present

## 2021-12-27 DIAGNOSIS — N5201 Erectile dysfunction due to arterial insufficiency: Secondary | ICD-10-CM

## 2021-12-27 DIAGNOSIS — R310 Gross hematuria: Secondary | ICD-10-CM

## 2021-12-27 DIAGNOSIS — R351 Nocturia: Secondary | ICD-10-CM

## 2021-12-27 DIAGNOSIS — N400 Enlarged prostate without lower urinary tract symptoms: Secondary | ICD-10-CM | POA: Diagnosis not present

## 2021-12-27 LAB — URINALYSIS, ROUTINE W REFLEX MICROSCOPIC
Bilirubin, UA: NEGATIVE
Glucose, UA: NEGATIVE
Ketones, UA: NEGATIVE
Leukocytes,UA: NEGATIVE
Nitrite, UA: NEGATIVE
Protein,UA: NEGATIVE
RBC, UA: NEGATIVE
Specific Gravity, UA: <1.005 — ABNORMAL LOW (ref 1.005–1.030)
Urobilinogen, Ur: 0.2 mg/dL (ref 0.2–1.0)
pH, UA: 6 (ref 5.0–7.5)

## 2021-12-27 MED ORDER — TADALAFIL 20 MG PO TABS: 20.0000 mg | ORAL_TABLET | ORAL | 5 refills | Status: DC | PRN

## 2021-12-27 MED ORDER — TADALAFIL 5 MG PO TABS: 5.0000 mg | ORAL_TABLET | Freq: Every day | ORAL | 11 refills | Status: DC

## 2021-12-27 NOTE — Progress Notes (Signed)
? ?12/27/2021 ?9:30 AM  ? ?[name] ?08-Sep-1945 ?[id number] ? ?Referring provider: [name], MD ?[address] ?[place],  [place] [address] ? ?Followup BPH and erectile dysfunction ? ? ?HPI: ?Mr Richard Webb is a 76yo here for followup for BPH and erectile dysfunction. IPSS 13 QOL 3 on tadalafil 30m daily. Nocturia 1-3x, urine stream strong. NO straining to urinate. No recent gross hematuriaHe notes his erections have slightly improved on tadalafil 582mdaily. He continues to get a semifirm erection.  ? ? ?PMH: ?Past Medical History:  ?Diagnosis Date  ? Arthritis   ? Asthma   ? Coronary atherosclerosis of native coronary artery   ? a. DES x 3 RCA and DES mCx 10/11 - [place] - with residual 50-70% stenosis in the distal Cx in AV groove, 70% distal LAD beyond apex, 70% small D1. b. 12/2017: similar results with multivessel CAD. Patent stents. Medical management recommended.  c. s/p NSTEMI in 10/2020 with DES to prox-LAD  ? Diverticulitis   ? GERD (gastroesophageal reflux disease)   ? History of kidney stones   ? Hypertension   ? Mixed hyperlipidemia   [facility]  ? Obstructive sleep apnea   ? [facility] ([facility]  ? February 2022  ? [facility] ([facility]  ? PASP 50 mmHg  ? Restless leg syndrome   ? Sarcoidosis   ? Statin intolerance   ? [facility]ative colitis ([facility]  ? ? ?Surgical History: ?Past Surgical History:  ?Procedure Laterality Date  ? Ankle fracture repair Left 1985  ? APPENDECTOMY    ? CARDIAC CATHETERIZATION    ? stents x4  ? CHOLECYSTECTOMY  1970  ? CORONARY STENT INTERVENTION N/A 10/19/2020  ? Procedure: CORONARY STENT INTERVENTION;  Surgeon: KeTroy SineMD;  [facility];  Service: Cardiovascular;  Laterality: N/A;  ? CYSTOSCOPY N/A 04/22/2021  ? Procedure: CYSTOSCOPY WITHE FIREFLY INJECTIONS;  Surgeon: WrIrine SealMD;  Location: [facility];  Service: Urology;  Laterality: N/A;  ? CYSTOSCOPY WITH INSERTION OF UROLIFT N/A 08/27/2017  ? Procedure: CYSTOSCOPY WITH  INSERTION OF UROLIFT;  Surgeon: McCleon GustinMD;  Location: [facility];  Service: Urology;  Laterality: N/A;  ? JOINT REPLACEMENT    ? LAPAROSCOPIC APPENDECTOMY  2013  ? LEFT HEART CATH AND CORONARY ANGIOGRAPHY N/A 01/02/2018  ? Procedure: LEFT HEART CATH AND CORONARY ANGIOGRAPHY;  Surgeon: KeTroy SineMD;  [facility];  Service: Cardiovascular;  Laterality: N/A;  ? LEFT HEART CATH AND CORONARY ANGIOGRAPHY N/A 10/19/2020  ? Procedure: LEFT HEART CATH AND CORONARY ANGIOGRAPHY;  Surgeon: KeTroy SineMD;  [facility];  Service: Cardiovascular;  Laterality: N/A;  ? Right total knee replacement Bilateral 2005  ? ? ?Home Medications:  ?Allergies as of 12/27/2021   ? ?   Reactions  ? Bee Venom Anaphylaxis  ? Statins Other (See Comments)  ? Myalgias - Simvastatin and Rosuvastatin  ? ?  ? ?  ?Medication List  ?  ? ?  ? Accurate as of Dec 27, 2021  9:30 AM. If you have any questions, ask your nurse or doctor.  ?  ?  ? ?  ? ?acetaminophen 500 MG tablet ?Commonly known as: TYLENOL ?Take 1,000 mg by mouth 2 (two) times daily as needed for moderate pain. ?  ?albuterol 108 (90 Base) MCG/ACT inhaler ?Commonly known as: VENTOLIN HFA ?SMARTSIG:2 Puff(s) By Mouth Every 4-6 Hours PRN ?  ?aspirin EC 81 MG tablet ?  Take 81 mg by mouth daily. ?  ?Breo Ellipta 200-25 MCG/ACT Aepb ?Generic drug: fluticasone furoate-vilanterol ?Inhale 1 Inhaler into the lungs daily. ?  ?carbidopa-levodopa 10-100 MG tablet ?Commonly known as: SINEMET IR ?Take 1 tablet by mouth at bedtime. ?  ?cyanocobalamin 1000 MCG/ML injection ?Commonly known as: (VITAMIN B-12) ?Inject 1,000 mcg into the muscle every 30 (thirty) days. ?  ?Dupilumab 300 MG/2ML Sopn ?Inject 300 mg into the skin every 14 (fourteen) days. ?  ?finasteride 5 MG tablet ?Commonly known as: PROSCAR ?Take 1 tablet (5 mg total) by mouth daily. ?  ?folic acid 1 MG tablet ?Commonly known as: FOLVITE ?Take 1 mg by mouth See admin instructions. Mon - Fri ?   ?HYDROcodone-acetaminophen 5-325 MG tablet ?Commonly known as: NORCO/VICODIN ?Take 1-2 tablets by mouth every 6 (six) hours as needed for severe pain. ?  ?losartan 50 MG tablet ?Commonly known as: COZAAR ?Take 1 tablet (50 mg total) by mouth daily. ?  ?metoprolol tartrate 25 MG tablet ?Commonly known as: LOPRESSOR ?TAKE ONE-HALF TABLET BY  MOUTH TWICE DAILY ?  ?montelukast 10 MG tablet ?Commonly known as: SINGULAIR ?Take 10 mg by mouth at bedtime. ?  ?omeprazole 20 MG capsule ?Commonly known as: PRILOSEC ?Take 20 mg by mouth daily. ?  ?oxymetazoline 0.05 % nasal spray ?Commonly known as: AFRIN ?Place 1 spray into both nostrils 2 (two) times daily as needed for congestion. ?  ?Repatha SureClick 130 MG/ML Soaj ?Generic drug: Evolocumab ?INJECT 1 DOSE INTO THE SKIN EVERY 14 DAYS. &lt;PLEASE MAKE APPOINTMENT FOR REFILLS&gt; ?  ?sulfaSALAzine 500 MG tablet ?Commonly known as: AZULFIDINE ?Take 1,000 mg by mouth 2 (two) times daily. ?  ?tadalafil 20 MG tablet ?Commonly known as: CIALIS ?Take 1 tablet (20 mg total) by mouth daily as needed. ?  ?tiZANidine 4 MG tablet ?Commonly known as: ZANAFLEX ?Take 4 mg by mouth at bedtime. ?  ?Vitamin D (Ergocalciferol) 1.25 MG (50000 UNIT) Caps capsule ?Commonly known as: DRISDOL ?Take 50,000 Units by mouth every 7 (seven) days. Thursdays ?  ? ?  ? ? ?Allergies:  ?Allergies  ?Allergen Reactions  ? Bee Venom Anaphylaxis  ? Statins Other (See Comments)  ?  Myalgias - Simvastatin and Rosuvastatin  ? ? ?Family History: ?Family History  ?Problem Relation Age of Onset  ? Colon cancer Mother   ? CAD Father   ? ? ?Social History:  reports that he has never smoked. He has never used smokeless tobacco. He reports that he does not drink alcohol and does not use drugs. ? ?ROS: ?All other review of systems were reviewed and are negative except what is noted above in HPI ? ?Physical Exam: ?BP (!) 136/59   Pulse 65   ?Constitutional:  Alert and oriented, No acute distress. ?HEENT: [place] AT, moist  mucus membranes.  Trachea midline, no masses. ?Cardiovascular: No clubbing, cyanosis, or edema. ?Respiratory: Normal respiratory effort, no increased work of breathing. ?GI: Abdomen is soft, nontender, nondistended, no abdominal masses ?GU: No CVA tenderness.  ?Lymph: No cervical or inguinal lymphadenopathy. ?Skin: No rashes, bruises or suspicious lesions. ?Neurologic: Grossly intact, no focal deficits, moving all 4 extremities. ?Psychiatric: Normal mood and affect. ? ?Laboratory Data: ?Lab Results  ?Component Value Date  ? WBC 10.4 04/25/2021  ? HGB 8.2 (L) 04/25/2021  ? HCT 26.4 (L) 04/25/2021  ? MCV 89.8 04/25/2021  ? PLT 205 04/25/2021  ? ? ?Lab Results  ?Component Value Date  ? CREATININE 0.87 04/25/2021  ? ? ?No results found for: PSA ? ?  No results found for: TESTOSTERONE ? ?Lab Results  ?Component Value Date  ? HGBA1C 4.9 04/11/2021  ? ? ?Urinalysis ?   ?Component Value Date/Time  ? COLORURINE AMBER (A) 01/24/2021 0957  ? APPEARANCEUR Clear 09/30/2021 1035  ? LABSPEC 1.004 (L) 01/24/2021 0957  ? PHURINE 7.0 01/24/2021 0957  ? GLUCOSEU Negative 09/30/2021 1035  ? HGBUR LARGE (A) 01/24/2021 0957  ? BILIRUBINUR Negative 09/30/2021 1035  ? [place] NEGATIVE 01/24/2021 0957  ? PROTEINUR Negative 09/30/2021 1035  ? [place] NEGATIVE 01/24/2021 0957  ? NITRITE Negative 09/30/2021 1035  ? NITRITE NEGATIVE 01/24/2021 0957  ? LEUKOCYTESUR Negative 09/30/2021 1035  ? LEUKOCYTESUR LARGE (A) 01/24/2021 0957  ? ? ?Lab Results  ?Component Value Date  ? LABMICR Comment 09/30/2021  ? [place] None seen 09/23/2021  ? LABEPIT None seen 09/23/2021  ? MUCUS Present 05/16/2021  ? BACTERIA None seen 09/23/2021  ? ? ?Pertinent Imaging: ? ?No results found for this or any previous visit. ? ?No results found for this or any previous visit. ? ?No results found for this or any previous visit. ? ?No results found for this or any previous visit. ? ?No results found for this or any previous visit. ? ?No results found for this or any  previous visit. ? ?Results for orders placed during the hospital encounter of 04/09/20 ? ?CT HEMATURIA WORKUP ? ?Narrative ?CLINICAL DATA:  Gross hematuria with lower abdominal and groin pain ? ?EXAM: ?CT ABDOMEN AND

## 2022-01-04 ENCOUNTER — Encounter: Payer: Self-pay | Admitting: Urology

## 2022-01-04 NOTE — Patient Instructions (Signed)
Erectile Dysfunction ?Erectile dysfunction (ED) is the inability to get or keep an erection in order to have sexual intercourse. ED is considered a symptom of an underlying disorder and is not considered a disease. ED may include: ?Inability to get an erection. ?Lack of enough hardness of the erection to allow penetration. ?Loss of erection before sex is finished. ?What are the causes? ?This condition may be caused by: ?Physical causes, such as: ?Artery problems. This may include heart disease, high blood pressure, atherosclerosis, and diabetes. ?Hormonal problems, such as low testosterone. ?Obesity. ?Nerve problems. This may include back or pelvic injuries, multiple sclerosis, Parkinson's disease, spinal cord injury, and stroke. ?Certain medicines, such as: ?Pain relievers. ?Antidepressants. ?Blood pressure medicines and water pills (diuretics). ?Cancer medicines. ?Antihistamines. ?Muscle relaxants. ?Lifestyle factors, such as: ?Use of drugs such as marijuana, cocaine, or opioids. ?Excessive use of alcohol. ?Smoking. ?Lack of physical activity or exercise. ?Psychological causes, such as: ?Anxiety or stress. ?Sadness or depression. ?Exhaustion. ?Fear about sexual performance. ?Guilt. ?What are the signs or symptoms? ?Symptoms of this condition include: ?Inability to get an erection. ?Lack of enough hardness of the erection to allow penetration. ?Loss of the erection before sex is finished. ?Sometimes having normal erections, but with frequent unsatisfactory episodes. ?Low sexual satisfaction in either partner due to erection problems. ?A curved penis occurring with erection. The curve may cause pain, or the penis may be too curved to allow for intercourse. ?Never having nighttime or morning erections. ?How is this diagnosed? ?This condition is often diagnosed by: ?Performing a physical exam to find other diseases or specific problems with the penis. ?Asking you detailed questions about the problem. ?Doing tests,  such as: ?Blood tests to check for diabetes mellitus or high cholesterol, or to measure hormone levels. ?Other tests to check for underlying health conditions. ?An ultrasound exam to check for scarring. ?A test to check blood flow to the penis. ?Doing a sleep study at home to measure nighttime erections. ?How is this treated? ?This condition may be treated by: ?Medicines, such as: ?Medicine taken by mouth to help you achieve an erection (oral medicine). ?Hormone replacement therapy to replace low testosterone levels. ?Medicine that is injected into the penis. Your health care provider may instruct you how to give yourself these injections at home. ?Medicine that is delivered with a short applicator tube. The tube is inserted into the opening at the tip of the penis, which is the opening of the urethra. A tiny pellet of medicine is put in the urethra. The pellet dissolves and enhances erectile function. This is also called MUSE (medicated urethral system for erections) therapy. ?Vacuum pump. This is a pump with a ring on it. The pump and ring are placed on the penis and used to create pressure that helps the penis become erect. ?Penile implant surgery. In this procedure, you may receive: ?An inflatable implant. This consists of cylinders, a pump, and a reservoir. The cylinders can be inflated with a fluid that helps to create an erection, and they can be deflated after intercourse. ?A semi-rigid implant. This consists of two silicone rubber rods. The rods provide some rigidity. They are also flexible, so the penis can both curve downward in its normal position and become straight for sexual intercourse. ?Blood vessel surgery to improve blood flow to the penis. During this procedure, a blood vessel from a different part of the body is placed into the penis to allow blood to flow around (bypass) damaged or blocked blood vessels. ?Lifestyle changes,  such as exercising more, losing weight, and quitting smoking. ?Follow  these instructions at home: ?Medicines ? ?Take over-the-counter and prescription medicines only as told by your health care provider. Do not increase the dosage without first discussing it with your health care provider. ?If you are using self-injections, do injections as directed by your health care provider. Make sure you avoid any veins that are on the surface of the penis. After giving an injection, apply pressure to the injection site for 5 minutes. ?Talk to your health care provider about how to prevent headaches while taking ED medicines. These medicines may cause a sudden headache due to the increase in blood flow in your body. ?General instructions ?Exercise regularly, as directed by your health care provider. Work with your health care provider to lose weight, if needed. ?Do not use any products that contain nicotine or tobacco. These products include cigarettes, chewing tobacco, and vaping devices, such as e-cigarettes. If you need help quitting, ask your health care provider. ?Before using a vacuum pump, read the instructions that come with the pump and discuss any questions with your health care provider. ?Keep all follow-up visits. This is important. ?Contact a health care provider if: ?You feel nauseous. ?You are vomiting. ?You get sudden headaches while taking ED medicines. ?You have any concerns about your sexual health. ?Get help right away if: ?You are taking oral or injectable medicines and you have an erection that lasts longer than 4 hours. If your health care provider is unavailable, go to the nearest emergency room for evaluation. An erection that lasts much longer than 4 hours can result in permanent damage to your penis. ?You have severe pain in your groin or abdomen. ?You develop redness or severe swelling of your penis. ?You have redness spreading at your groin or lower abdomen. ?You are unable to urinate. ?You experience chest pain or a rapid heartbeat (palpitations) after taking oral  medicines. ?These symptoms may represent a serious problem that is an emergency. Do not wait to see if the symptoms will go away. Get medical help right away. Call your local emergency services (911 in the [place] Do not drive yourself to the hospital. ?Summary ?Erectile dysfunction (ED) is the inability to get or keep an erection during sexual intercourse. ?This condition is diagnosed based on a physical exam, your symptoms, and tests to determine the cause. Treatment varies depending on the cause and may include medicines, hormone therapy, surgery, or a vacuum pump. ?You may need follow-up visits to make sure that you are using your medicines or devices correctly. ?Get help right away if you are taking or injecting medicines and you have an erection that lasts longer than 4 hours. ?This information is not intended to replace advice given to you by your health care provider. Make sure you discuss any questions you have with your health care provider. ?Document Revised: 11/03/2020 Document Reviewed: 11/03/2020 ?Elsevier Patient Education ? [place]. ? ?

## 2022-02-10 DIAGNOSIS — E538 Deficiency of other specified B group vitamins: Secondary | ICD-10-CM | POA: Diagnosis not present

## 2022-02-14 ENCOUNTER — Other Ambulatory Visit: Payer: Self-pay | Admitting: Internal Medicine

## 2022-02-14 DIAGNOSIS — E782 Mixed hyperlipidemia: Secondary | ICD-10-CM

## 2022-02-21 IMAGING — DX DG CHEST 1V PORT
1 series · 1 of 1 positions shown · non-contrast
Comparison: None.

CLINICAL DATA: Chest pain.

EXAM:
PORTABLE CHEST 1 VIEW

[chest ap grid]
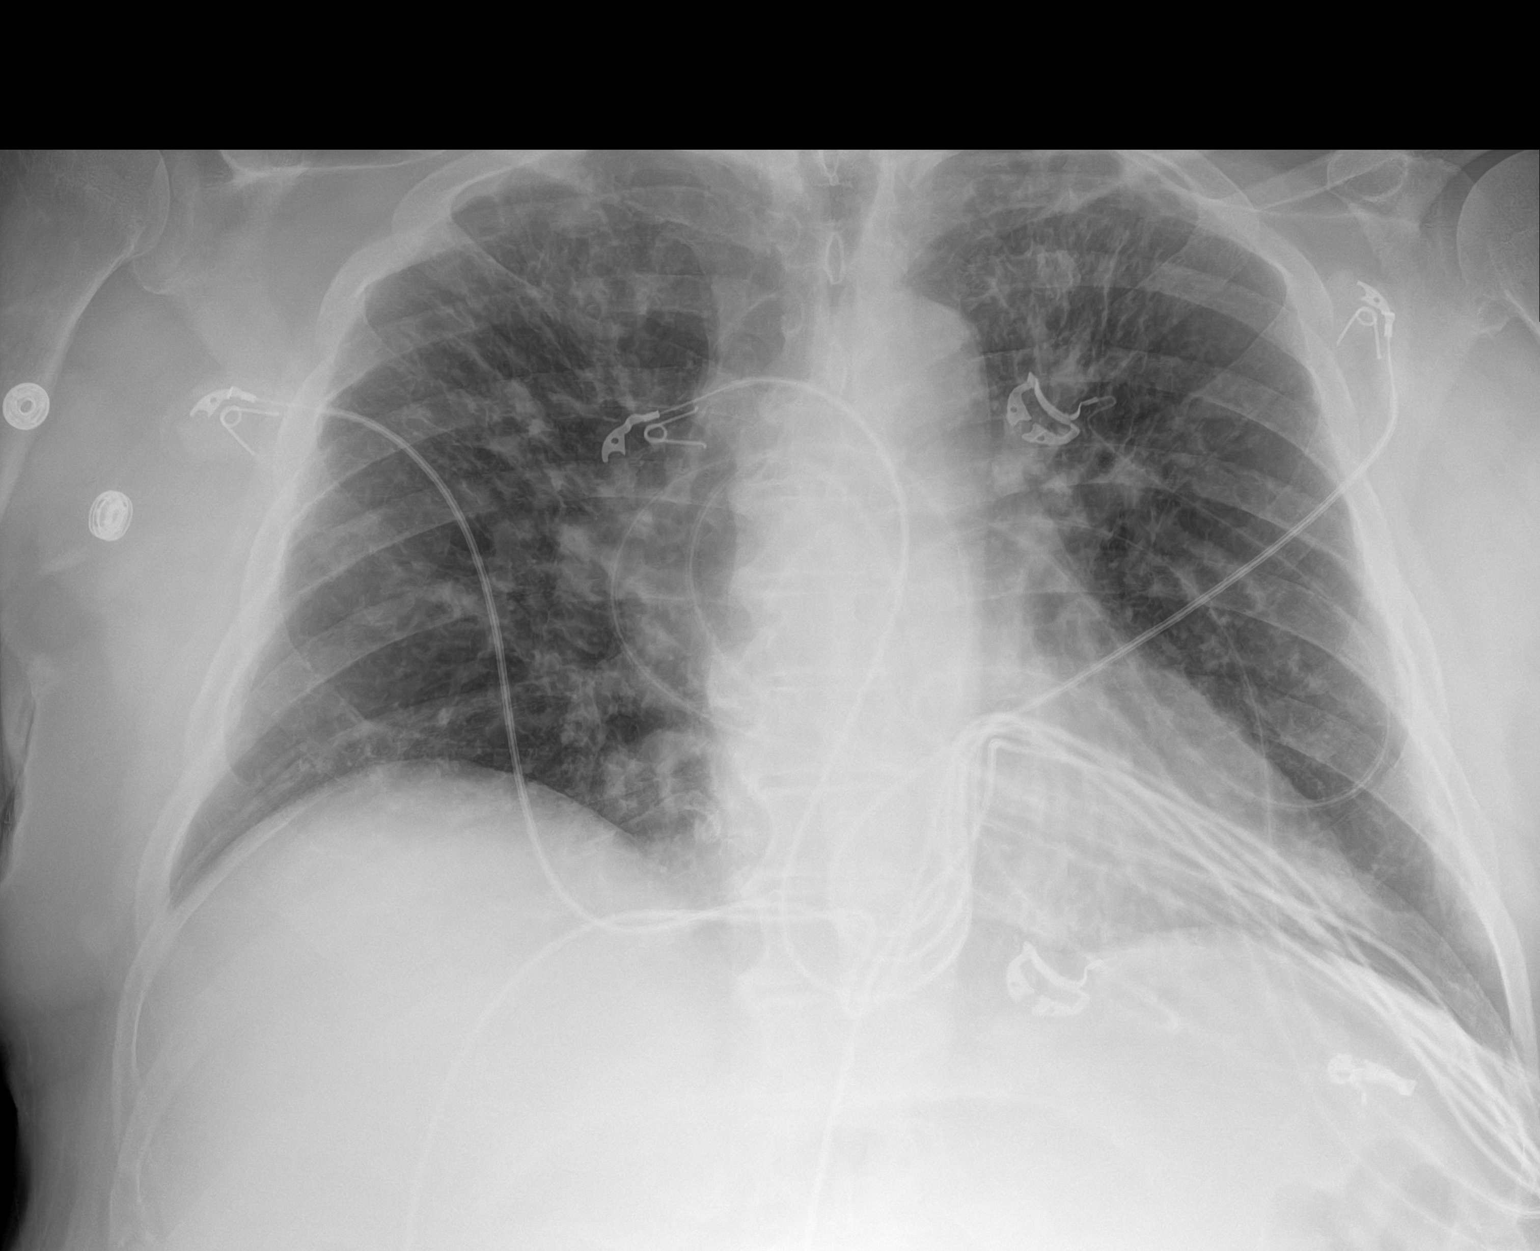

[1 of 1 positions shown; findings below may reference images not displayed]

FINDINGS: The heart size and mediastinal contours are within normal limits. No
pneumothorax or pleural effusion is noted. Bilateral upper lobe
opacities are noted which may represent pneumonia or possibly edema,
but neoplasm cannot be excluded. The visualized skeletal structures
are unremarkable.
IMPRESSION: Bilateral upper lobe opacities are noted which may represent
pneumonia or possibly edema, but neoplasm cannot be excluded. CT
scan of the chest is recommended for further evaluation.

## 2022-02-28 DIAGNOSIS — D649 Anemia, unspecified: Secondary | ICD-10-CM | POA: Diagnosis not present

## 2022-02-28 DIAGNOSIS — G4700 Insomnia, unspecified: Secondary | ICD-10-CM | POA: Diagnosis not present

## 2022-02-28 DIAGNOSIS — K51519 Left sided colitis with unspecified complications: Secondary | ICD-10-CM | POA: Diagnosis not present

## 2022-02-28 DIAGNOSIS — M545 Low back pain, unspecified: Secondary | ICD-10-CM | POA: Diagnosis not present

## 2022-02-28 DIAGNOSIS — E7800 Pure hypercholesterolemia, unspecified: Secondary | ICD-10-CM | POA: Diagnosis not present

## 2022-02-28 DIAGNOSIS — L239 Allergic contact dermatitis, unspecified cause: Secondary | ICD-10-CM | POA: Diagnosis not present

## 2022-02-28 DIAGNOSIS — I10 Essential (primary) hypertension: Secondary | ICD-10-CM | POA: Diagnosis not present

## 2022-02-28 DIAGNOSIS — J45998 Other asthma: Secondary | ICD-10-CM | POA: Diagnosis not present

## 2022-02-28 DIAGNOSIS — Z6830 Body mass index (BMI) 30.0-30.9, adult: Secondary | ICD-10-CM | POA: Diagnosis not present

## 2022-02-28 DIAGNOSIS — N400 Enlarged prostate without lower urinary tract symptoms: Secondary | ICD-10-CM | POA: Diagnosis not present

## 2022-02-28 DIAGNOSIS — D869 Sarcoidosis, unspecified: Secondary | ICD-10-CM | POA: Diagnosis not present

## 2022-03-01 DIAGNOSIS — M25512 Pain in left shoulder: Secondary | ICD-10-CM | POA: Diagnosis not present

## 2022-03-01 DIAGNOSIS — M65331 Trigger finger, right middle finger: Secondary | ICD-10-CM | POA: Diagnosis not present

## 2022-03-06 ENCOUNTER — Other Ambulatory Visit: Payer: Self-pay | Admitting: Internal Medicine

## 2022-03-06 DIAGNOSIS — I214 Non-ST elevation (NSTEMI) myocardial infarction: Secondary | ICD-10-CM

## 2022-03-06 DIAGNOSIS — I2510 Atherosclerotic heart disease of native coronary artery without angina pectoris: Secondary | ICD-10-CM

## 2022-03-06 NOTE — Telephone Encounter (Signed)
*  STAT* If patient is at the pharmacy, call can be transferred to refill team.   1. Which medications need to be refilled? (please list name of each medication and dose if known) REPATHA SURECLICK 086 MG/ML SOAJ  2. Which pharmacy/location (including street and city if local pharmacy) is medication to be sent to? [facility]  3. Do they need a 30 day or 90 day supply? [place]

## 2022-03-07 ENCOUNTER — Other Ambulatory Visit: Payer: Self-pay | Admitting: *Deleted

## 2022-03-07 DIAGNOSIS — I214 Non-ST elevation (NSTEMI) myocardial infarction: Secondary | ICD-10-CM

## 2022-03-07 DIAGNOSIS — I2510 Atherosclerotic heart disease of native coronary artery without angina pectoris: Secondary | ICD-10-CM

## 2022-03-07 MED ORDER — REPATHA SURECLICK 140 MG/ML ~~LOC~~ SOAJ: SUBCUTANEOUS | 0 refills | Status: DC

## 2022-03-24 DIAGNOSIS — E7800 Pure hypercholesterolemia, unspecified: Secondary | ICD-10-CM | POA: Diagnosis not present

## 2022-03-29 DIAGNOSIS — M48061 Spinal stenosis, lumbar region without neurogenic claudication: Secondary | ICD-10-CM | POA: Diagnosis not present

## 2022-03-29 DIAGNOSIS — M542 Cervicalgia: Secondary | ICD-10-CM | POA: Diagnosis not present

## 2022-03-29 DIAGNOSIS — M5451 Vertebrogenic low back pain: Secondary | ICD-10-CM | POA: Diagnosis not present

## 2022-03-30 DIAGNOSIS — M5416 Radiculopathy, lumbar region: Secondary | ICD-10-CM | POA: Diagnosis not present

## 2022-03-30 DIAGNOSIS — M5412 Radiculopathy, cervical region: Secondary | ICD-10-CM | POA: Diagnosis not present

## 2022-03-31 DIAGNOSIS — M65331 Trigger finger, right middle finger: Secondary | ICD-10-CM | POA: Diagnosis not present

## 2022-03-31 DIAGNOSIS — M25512 Pain in left shoulder: Secondary | ICD-10-CM | POA: Diagnosis not present

## 2022-04-04 DIAGNOSIS — I10 Essential (primary) hypertension: Secondary | ICD-10-CM | POA: Diagnosis not present

## 2022-04-04 DIAGNOSIS — Z6832 Body mass index (BMI) 32.0-32.9, adult: Secondary | ICD-10-CM | POA: Diagnosis not present

## 2022-04-04 DIAGNOSIS — I2510 Atherosclerotic heart disease of native coronary artery without angina pectoris: Secondary | ICD-10-CM | POA: Diagnosis not present

## 2022-04-04 DIAGNOSIS — E7800 Pure hypercholesterolemia, unspecified: Secondary | ICD-10-CM | POA: Diagnosis not present

## 2022-04-05 DIAGNOSIS — Z125 Encounter for screening for malignant neoplasm of prostate: Secondary | ICD-10-CM | POA: Diagnosis not present

## 2022-04-05 DIAGNOSIS — Z6830 Body mass index (BMI) 30.0-30.9, adult: Secondary | ICD-10-CM | POA: Diagnosis not present

## 2022-04-05 DIAGNOSIS — N529 Male erectile dysfunction, unspecified: Secondary | ICD-10-CM | POA: Diagnosis not present

## 2022-04-05 DIAGNOSIS — R319 Hematuria, unspecified: Secondary | ICD-10-CM | POA: Diagnosis not present

## 2022-04-05 DIAGNOSIS — N400 Enlarged prostate without lower urinary tract symptoms: Secondary | ICD-10-CM | POA: Diagnosis not present

## 2022-04-21 DIAGNOSIS — G4709 Other insomnia: Secondary | ICD-10-CM | POA: Diagnosis not present

## 2022-04-21 DIAGNOSIS — G4733 Obstructive sleep apnea (adult) (pediatric): Secondary | ICD-10-CM | POA: Diagnosis not present

## 2022-04-21 DIAGNOSIS — G2581 Restless legs syndrome: Secondary | ICD-10-CM | POA: Diagnosis not present

## 2022-04-21 DIAGNOSIS — D649 Anemia, unspecified: Secondary | ICD-10-CM | POA: Diagnosis not present

## 2022-04-27 DIAGNOSIS — Z79899 Other long term (current) drug therapy: Secondary | ICD-10-CM | POA: Diagnosis not present

## 2022-04-27 DIAGNOSIS — D692 Other nonthrombocytopenic purpura: Secondary | ICD-10-CM | POA: Diagnosis not present

## 2022-04-27 DIAGNOSIS — L281 Prurigo nodularis: Secondary | ICD-10-CM | POA: Diagnosis not present

## 2022-04-27 DIAGNOSIS — L299 Pruritus, unspecified: Secondary | ICD-10-CM | POA: Diagnosis not present

## 2022-04-27 DIAGNOSIS — L570 Actinic keratosis: Secondary | ICD-10-CM | POA: Diagnosis not present

## 2022-04-27 DIAGNOSIS — L820 Inflamed seborrheic keratosis: Secondary | ICD-10-CM | POA: Diagnosis not present

## 2022-05-23 ENCOUNTER — Other Ambulatory Visit: Payer: Self-pay | Admitting: Cardiology

## 2022-05-23 DIAGNOSIS — I2510 Atherosclerotic heart disease of native coronary artery without angina pectoris: Secondary | ICD-10-CM

## 2022-05-23 DIAGNOSIS — I214 Non-ST elevation (NSTEMI) myocardial infarction: Secondary | ICD-10-CM

## 2022-08-05 ENCOUNTER — Other Ambulatory Visit: Payer: Self-pay | Admitting: Urology

## 2022-08-05 DIAGNOSIS — N5201 Erectile dysfunction due to arterial insufficiency: Secondary | ICD-10-CM

## 2022-08-08 ENCOUNTER — Telehealth: Payer: Self-pay | Admitting: Internal Medicine

## 2022-08-08 NOTE — Telephone Encounter (Signed)
PA for repatha submitted via CMM (Key: [id number])

## 2022-08-10 NOTE — Telephone Encounter (Signed)
Request Reference Number: [id number]. REPATHA SURE INJ 140MG/ML is approved through 08/21/2023. Your patient may now fill this prescription and it will be covered.

## 2022-08-25 IMAGING — MG DIGITAL DIAGNOSTIC BILAT W/ TOMO W/ CAD
8 of 14 series · 9 of 40 positions shown · non-contrast
Comparison: None.

ACR Breast Density Category a: The breast tissue is almost entirely
fatty.

CLINICAL DATA: 75-year-old male with a palpable abnormality with
associated tenderness in the retroareolar right breast.

EXAM:
DIGITAL DIAGNOSTIC BILATERAL MAMMOGRAM WITH TOMOSYNTHESIS AND CAD
TECHNIQUE: Bilateral digital diagnostic mammography and breast tomosynthesis
was performed. The images were evaluated with computer-aided
detection.

[L CC synth-2D (1 of 2)]
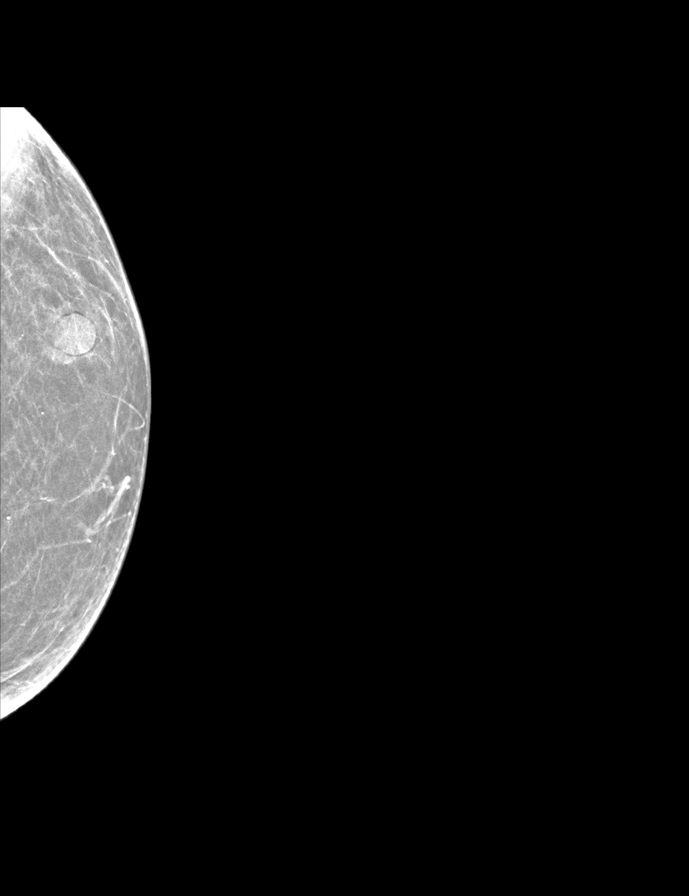

[L CC synth-2D (2 of 2)]
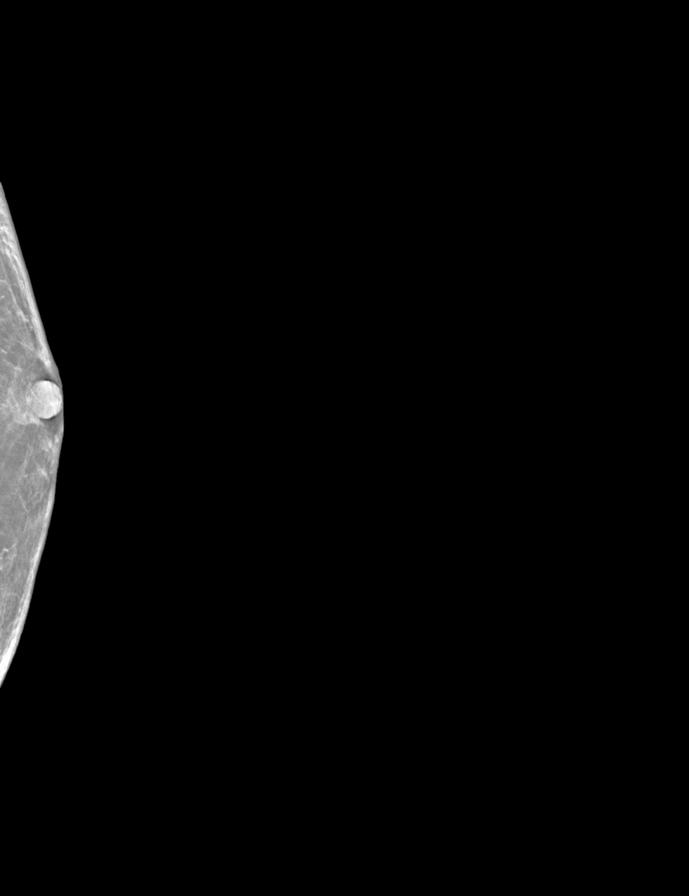

[R MLO synth-2D (1 of 2)]
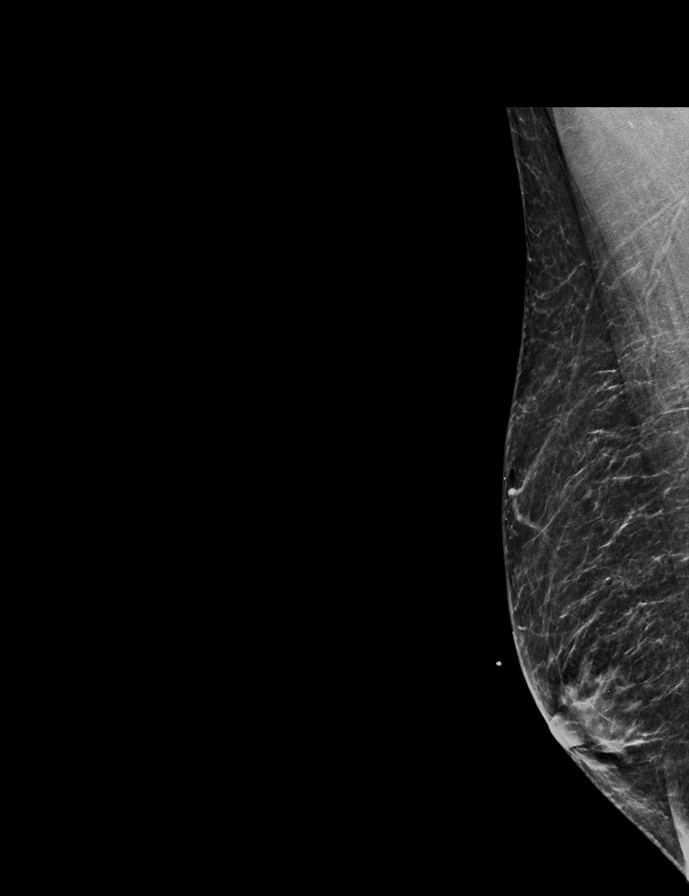

[R TAN synth-2D]
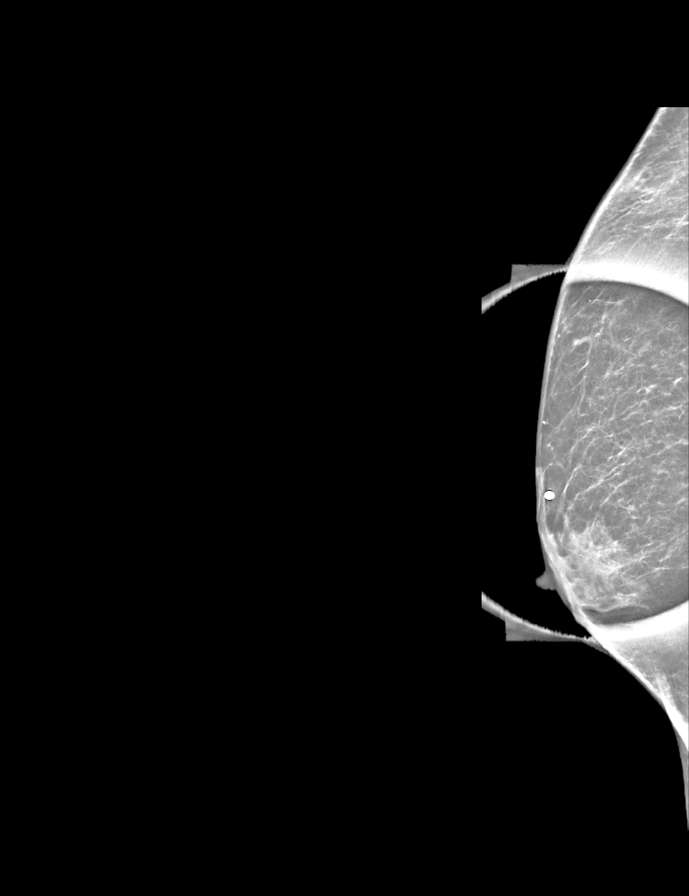

[L MLO synth-2D]
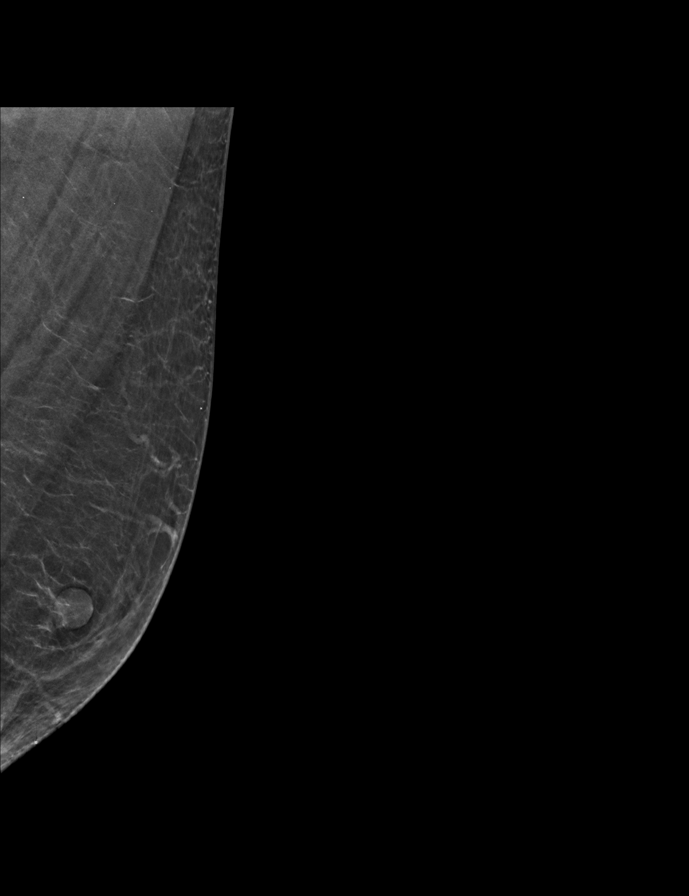

[R MLO synth-2D (2 of 2)]
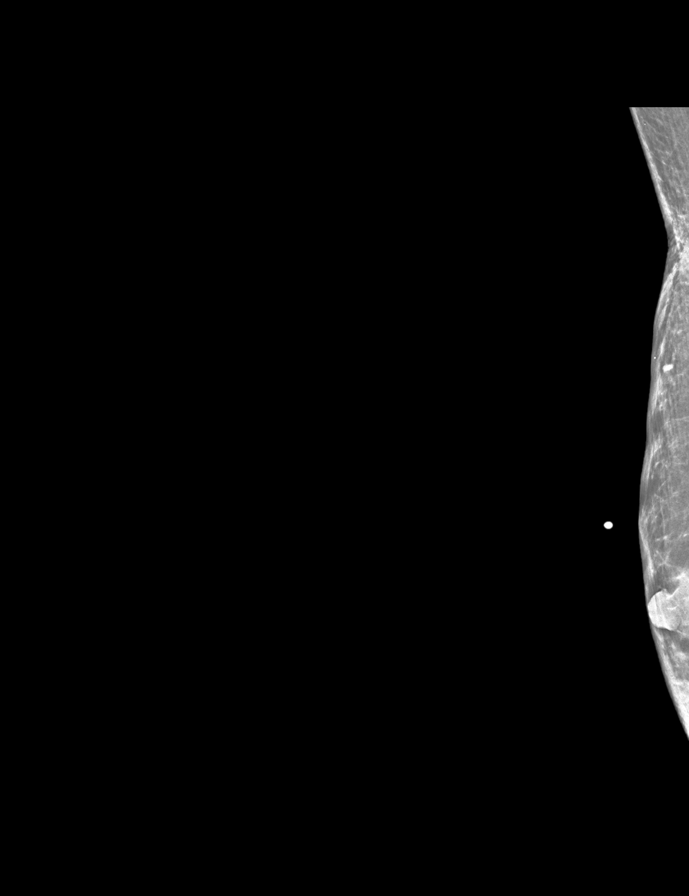

[R CC synth-2D]
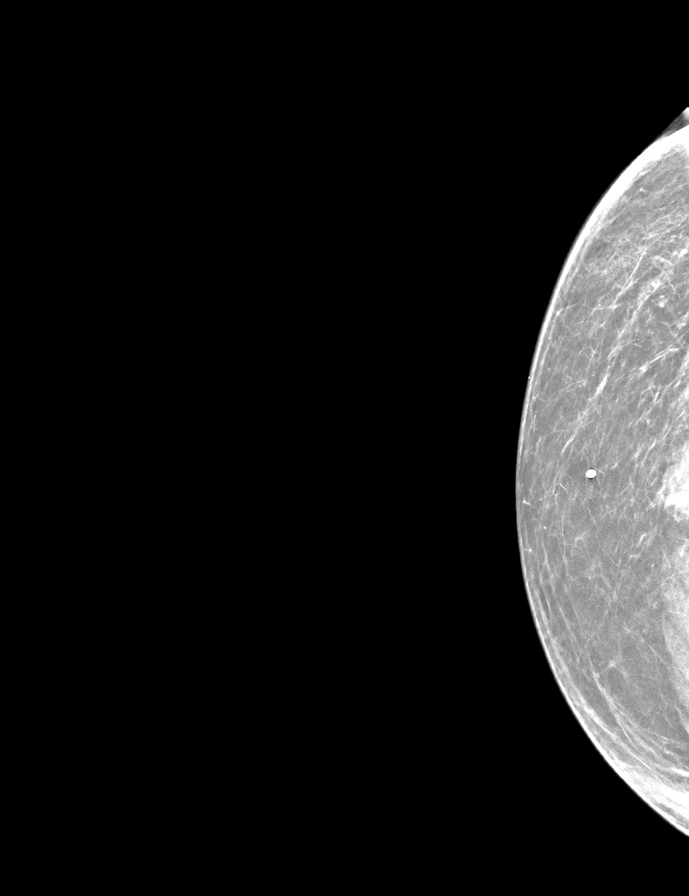

[L MLO tomo · 2 of 48 frames shown]
[frame 16/48]
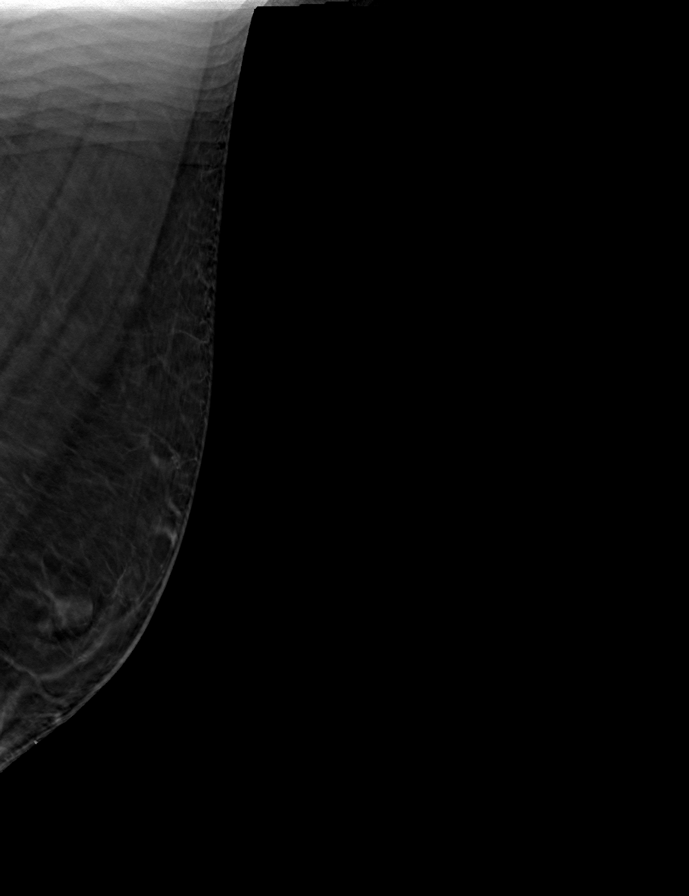
[frame 25/48]
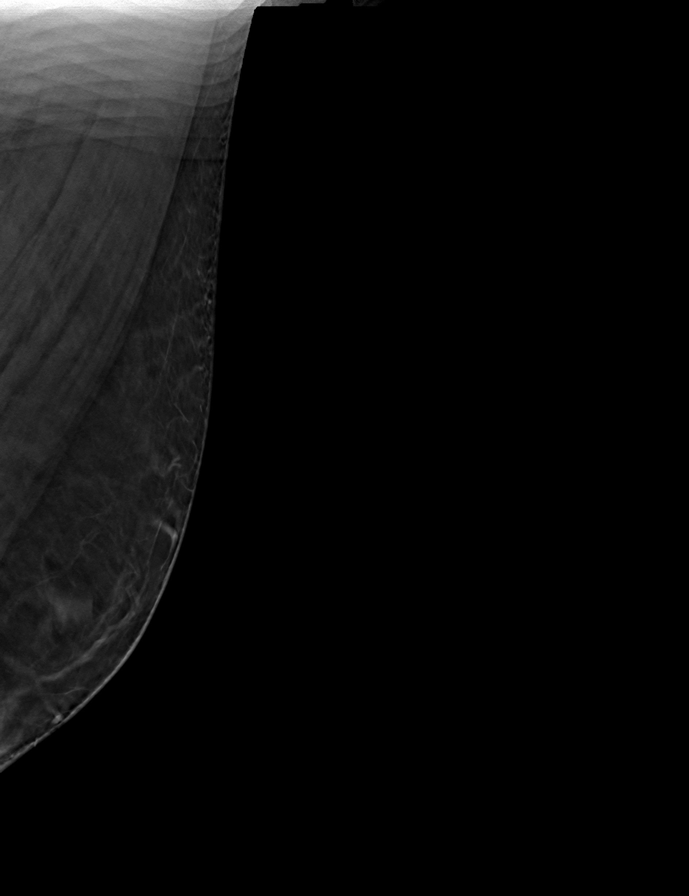

[9 of 40 positions shown; findings below may reference images not displayed]

FINDINGS: No suspicious masses or calcifications are seen in either breast.
There is a right retroareolar flame shaped density corresponding to
the palpable area of concern. Findings are most compatible with
benign gynecomastia. There is no mammographic evidence of malignancy
in either breast.
IMPRESSION: Gynecomastia, accounting for the right retroareolar palpable area of
concern with associated tenderness. No findings of malignancy in
either breast.

RECOMMENDATION:
Clinical follow-up.

I have discussed the findings and recommendations with the patient.
If applicable, a reminder letter will be sent to the patient
regarding the next appointment.

BI-RADS CATEGORY  2: Benign.

## 2022-08-26 IMAGING — CT CT ABD-PELV W/ CM
2 of 5 series · 15 of 46 positions shown, 17 images · IV contrast (iopamidol)
Comparison: CT abdomen and pelvis dated November 01, 2020; CT abdomen
and pelvis dated April 17, 2020

CLINICAL DATA: Colovesicular fistula

EXAM:
CT ABDOMEN AND PELVIS WITH CONTRAST
TECHNIQUE: Multidetector CT imaging of the abdomen and pelvis was performed
using the standard protocol following bolus administration of
intravenous contrast.
CONTRAST:  80mL [id number] IOPAMIDOL ([id number]) INJECTION 76%

[Series 2: abd pelvis 5.00 br40 s3 axial · axial · 0.79mm/px · z∈[+1186,+1621]mm · 12 of 99 slices shown, 14 images]
[im 6/99  soft-tissue]
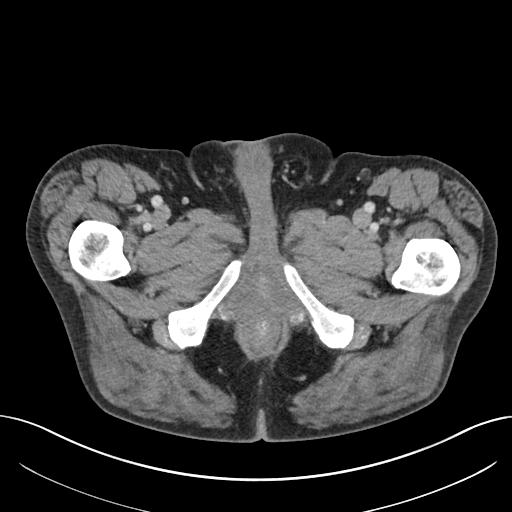
[im 6/99  bone]
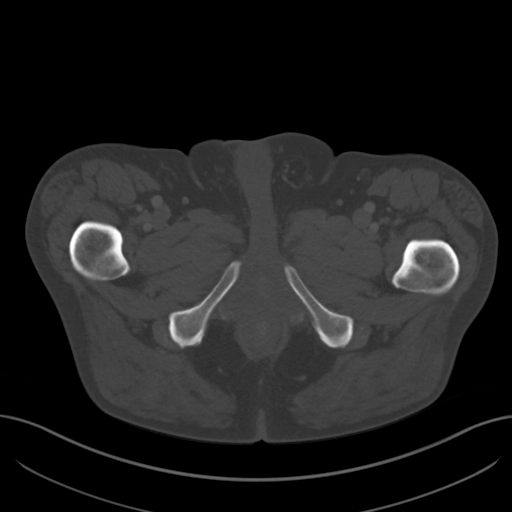
[im 17/99  soft-tissue]
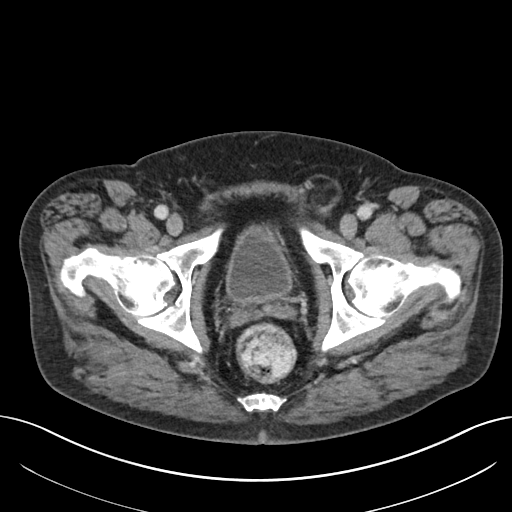
[im 22/99  soft-tissue]
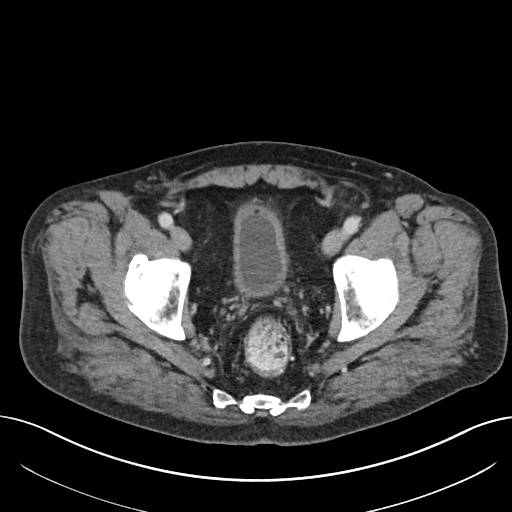
[im 28/99  soft-tissue]
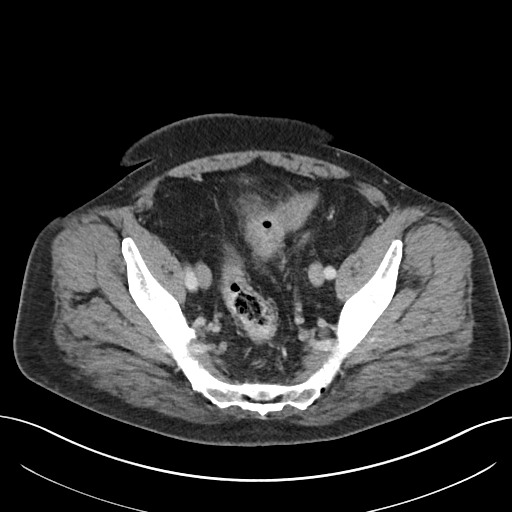
[im 39/99  soft-tissue]
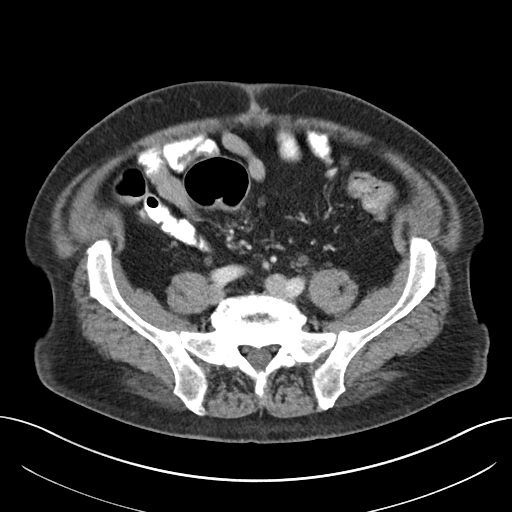
[im 44/99  soft-tissue]
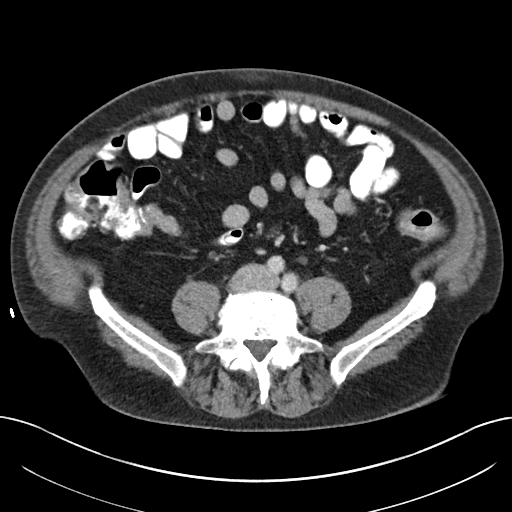
[im 55/99  soft-tissue]
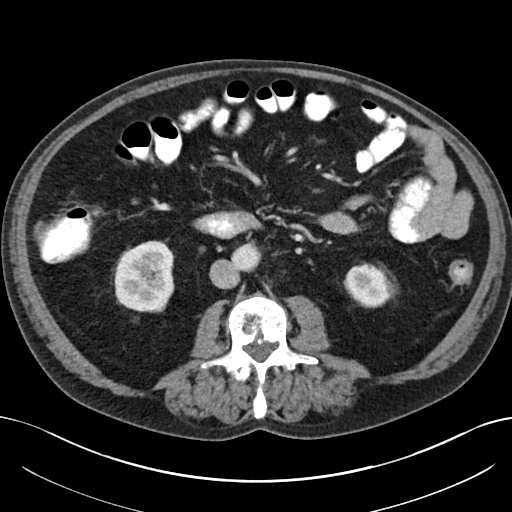
[im 60/99  soft-tissue]
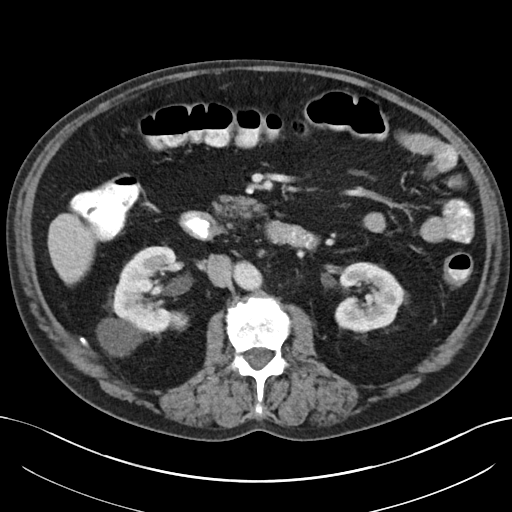
[im 71/99  soft-tissue]
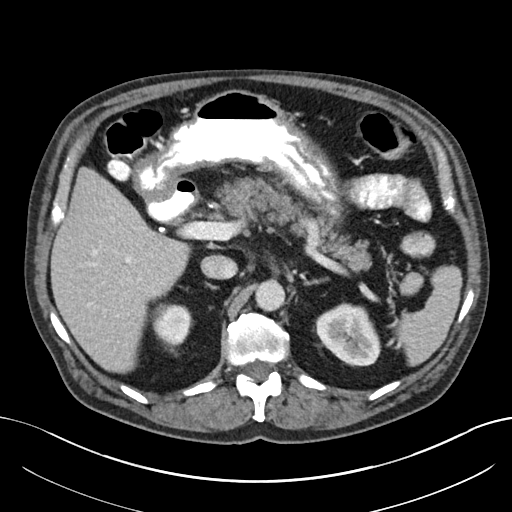
[im 71/99  bone]
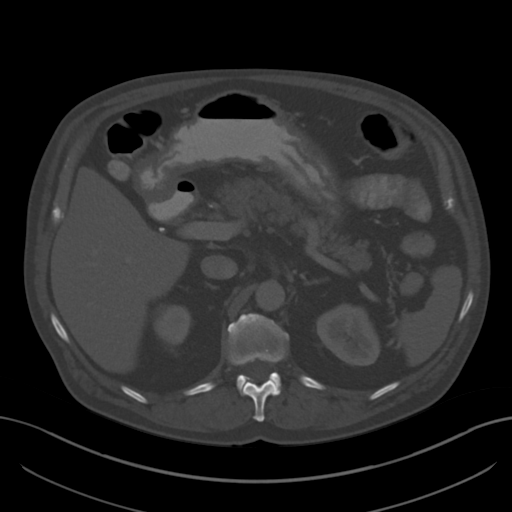
[im 77/99  soft-tissue]
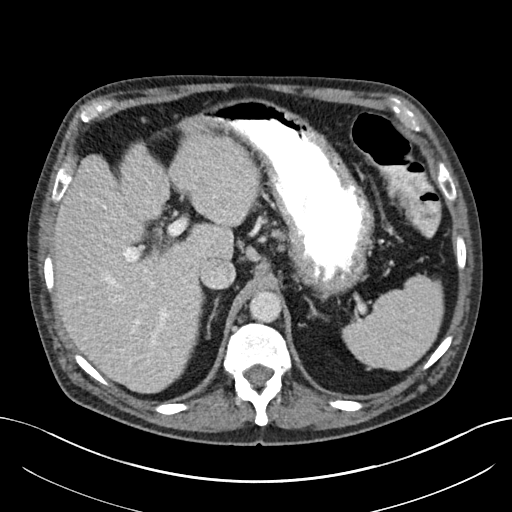
[im 82/99  soft-tissue]
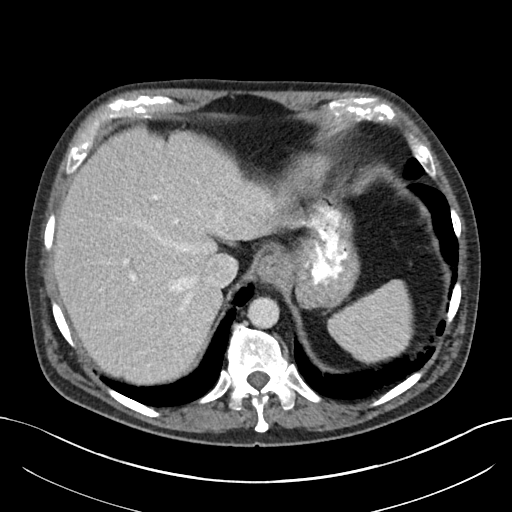
[im 93/99  soft-tissue]
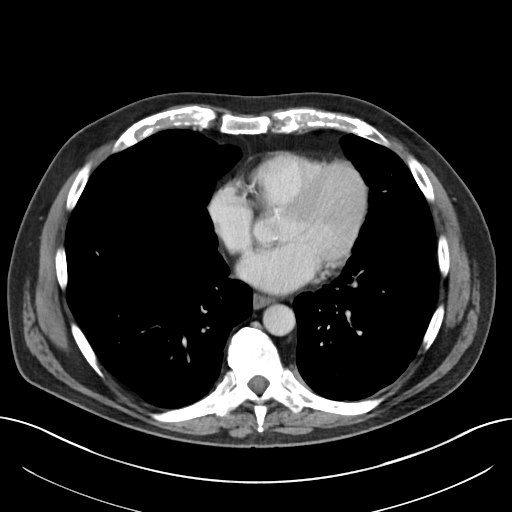

[Series 6: abd pelvis 2.00 br40 s3 cor · coronal · 0.79mm/px · 3 of 202 slices shown]
[im 68/202  soft-tissue]
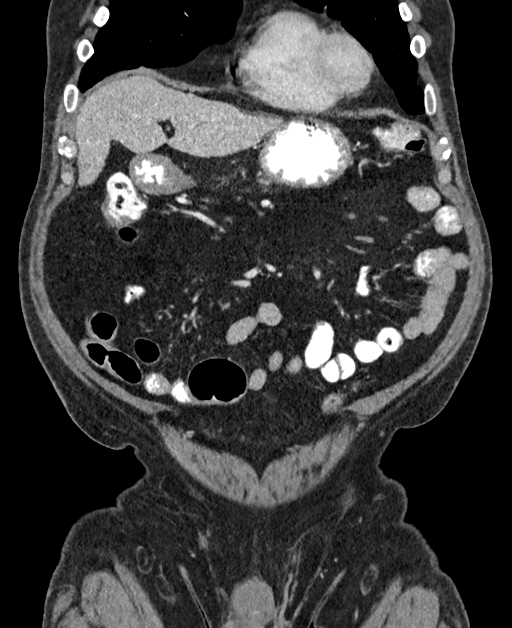
[im 90/202  soft-tissue]
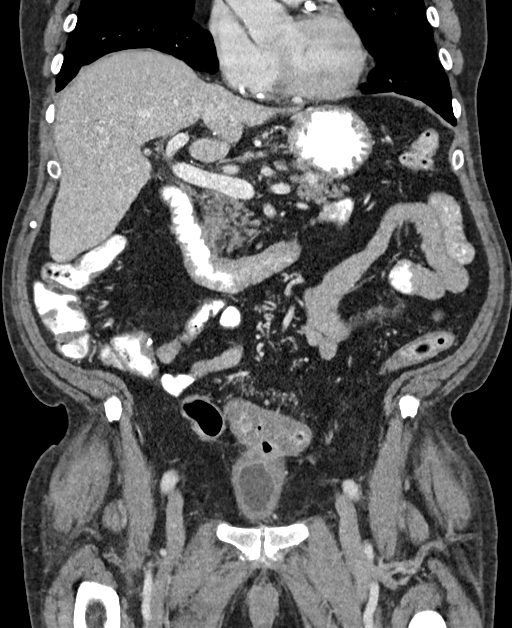
[im 112/202  soft-tissue]
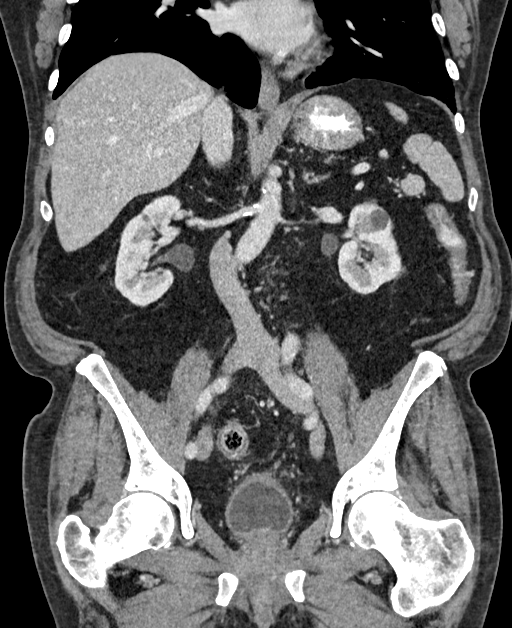

[15 of 46 positions shown; findings below may reference images not displayed]

FINDINGS: Lower chest: Partially visualized lower lungs demonstrate reticular
opacities and bilateral nodules, unchanged compared to 4143 prior
exam and compatible with chronic sarcoidosis. Coronary artery
calcifications and stents. No acute abnormality.

Hepatobiliary: No focal liver abnormality is seen. Status post
cholecystectomy. No biliary dilatation.

Pancreas: Unremarkable. No pancreatic ductal dilatation or
surrounding inflammatory changes.

Spleen: Normal in size without focal abnormality.

Adrenals/Urinary Tract: Bilateral adrenal glands are unremarkable.
Kidneys enhance symmetrically with no evidence of hydronephrosis.
Bilateral low-density renal lesions, largest are compatible with
simple cysts, others are too small to completely characterize.
Diffusely thick-walled urinary bladder with focal wall thickening of
the bladder dome, a portion of thick-walled sigmoid colon adherent
to this area. An air-filled cavity is seen in area of previous
sigmoid abscess. Air is present in the bladder, suggesting
persistent colovesicular fistula. Near-complete resolution of
adjacent inflammatory changes when compared with most recent prior
exam.

Stomach/Bowel: Stomach is normal in appearance. Wall thickening of
the sigmoid colon as described above. No evidence of obstruction.

Vascular/Lymphatic: Aortic atherosclerosis. No enlarged abdominal or
pelvic lymph nodes.

Reproductive: Metallic densities are seen in the prostate which are
likely fiducial markers.

Other: Small fat containing left inguinal hernia.

Musculoskeletal: No acute or significant osseous findings.
IMPRESSION: Sigmoid colon is adherent to the bladder dome. Air is seen in the
bladder, suggesting persistent colovesicular fistula.Near-complete
resolution of adjacent inflammatory changes when compared with most
recent prior exam.

Pulmonary nodules of the lung bases are unchanged in size compared
to prior exam and likely sequela of sarcoidosis.

Aortic Atherosclerosis (48OT4-0S8.8).

## 2022-08-29 IMAGING — DX DG CHEST 1V PORT
1 series · 1 of 1 positions shown · non-contrast
Comparison: 01/23/2021

CLINICAL DATA: Fever, shortness of breath, cough.

EXAM:
PORTABLE CHEST 1 VIEW

[chest ap]
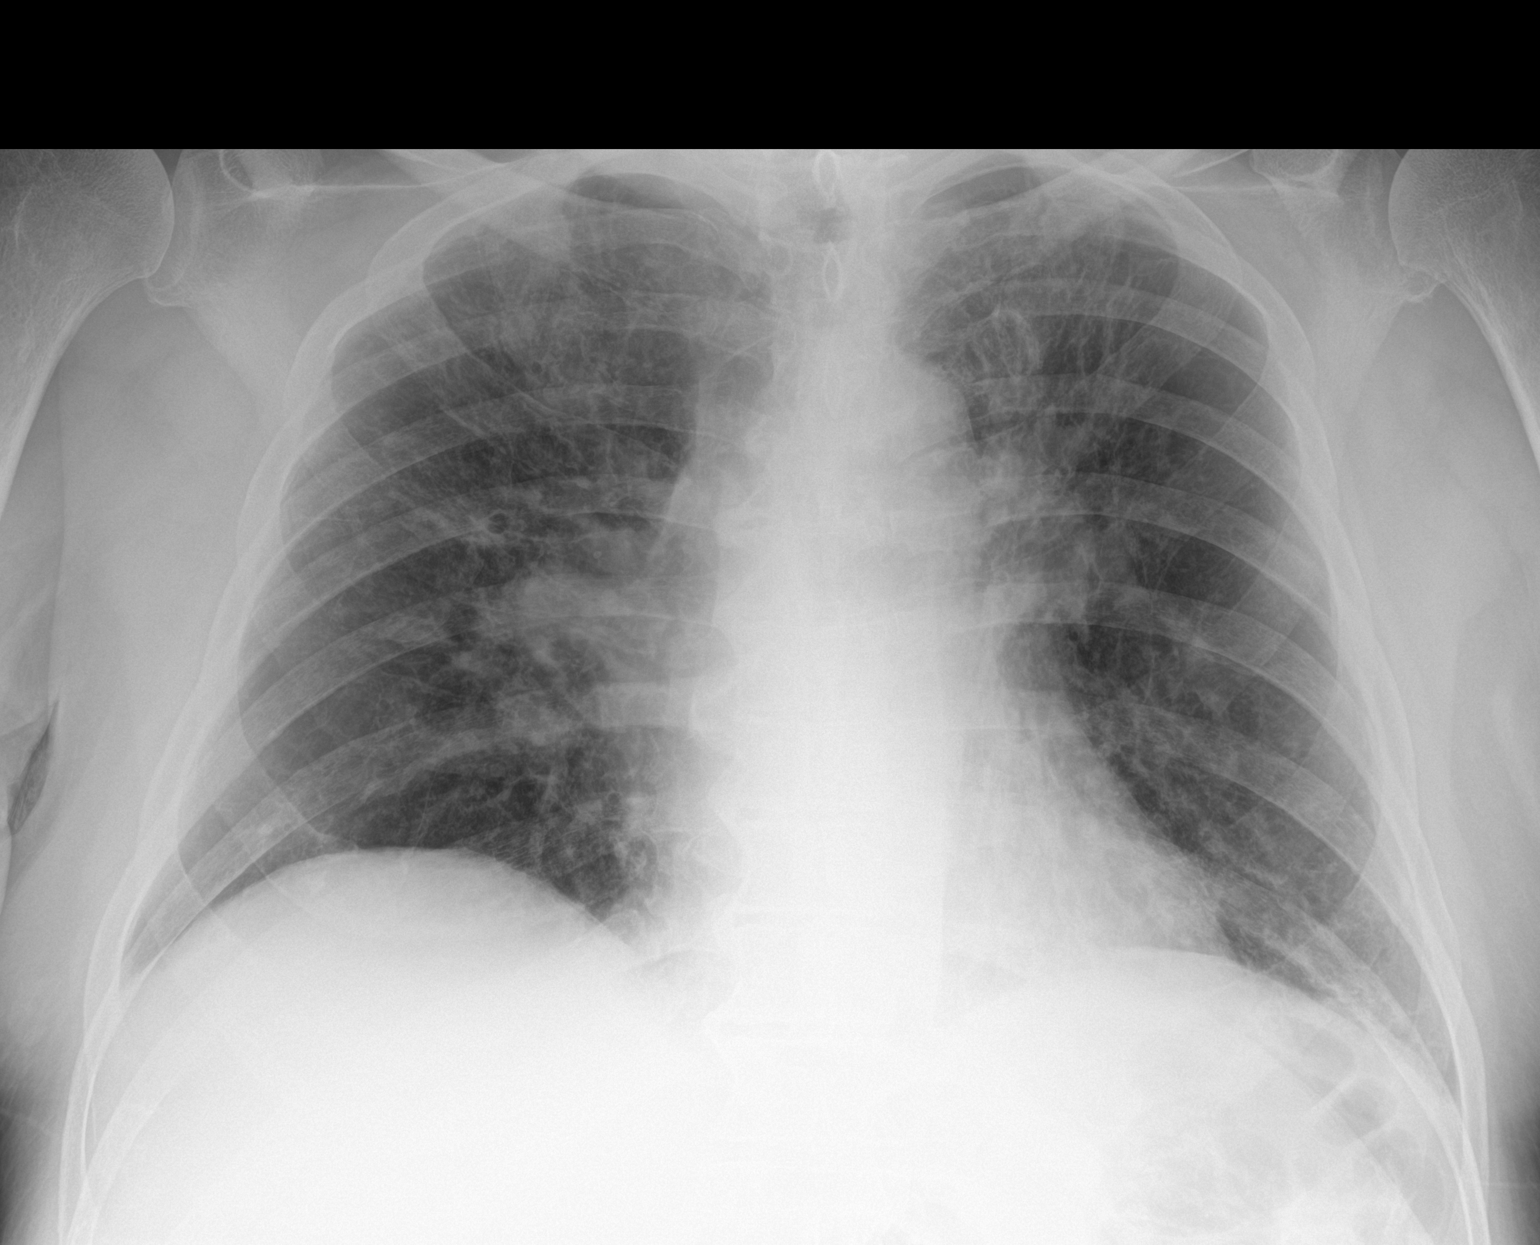

[1 of 1 positions shown; findings below may reference images not displayed]

FINDINGS: Heart is normal size. Prominent hila bilaterally, stable since prior
CT, compatible with pulmonary arterial hypertension. Chronic
scarring in the upper lobes and left base. No acute confluent
opacities or effusions. No acute bony abnormality.
IMPRESSION: Prominent hila bilaterally, stable since prior CT compatible with
pulmonary arterial hypertension.

Chronic changes.

No active disease.

## 2022-12-13 ENCOUNTER — Other Ambulatory Visit: Payer: Self-pay | Admitting: Cardiology

## 2023-02-09 ENCOUNTER — Other Ambulatory Visit: Payer: Self-pay | Admitting: Cardiology

## 2023-02-09 DIAGNOSIS — I2510 Atherosclerotic heart disease of native coronary artery without angina pectoris: Secondary | ICD-10-CM

## 2023-02-09 DIAGNOSIS — I214 Non-ST elevation (NSTEMI) myocardial infarction: Secondary | ICD-10-CM

## 2023-04-04 ENCOUNTER — Other Ambulatory Visit: Payer: Self-pay | Admitting: Cardiology

## 2023-05-02 ENCOUNTER — Other Ambulatory Visit: Payer: Self-pay | Admitting: Cardiology

## 2023-05-02 DIAGNOSIS — I214 Non-ST elevation (NSTEMI) myocardial infarction: Secondary | ICD-10-CM

## 2023-05-02 DIAGNOSIS — I2510 Atherosclerotic heart disease of native coronary artery without angina pectoris: Secondary | ICD-10-CM

## 2023-05-30 ENCOUNTER — Ambulatory Visit: Payer: Medicare Other | Admitting: Urology

## 2023-05-30 ENCOUNTER — Telehealth: Payer: Self-pay

## 2023-05-30 NOTE — Telephone Encounter (Signed)
Patient is made aware and voiced understanding. 

## 2023-05-30 NOTE — Telephone Encounter (Signed)
Pt scheduled with MD

## 2023-05-30 NOTE — Telephone Encounter (Signed)
Patient is passing blood clots and having retention.  Went to ER in [place], Thursday 05-24-2023.  A catheter was placed. No available appointments. Will not see Nurse Practitioner.  Thinking he may need surgery.  Please advise.  Call:  [phone]

## 2023-05-30 NOTE — Telephone Encounter (Signed)
Pt scheduled with MD on 06/01/2023 at 0900.  Please inform him of upcoming appt.

## 2023-06-01 ENCOUNTER — Ambulatory Visit (INDEPENDENT_AMBULATORY_CARE_PROVIDER_SITE_OTHER): Payer: Medicare Other | Admitting: Urology

## 2023-06-01 VITALS — BP 174/79 | HR 56 | Ht 72.0 in | Wt 216.0 lb

## 2023-06-01 DIAGNOSIS — N400 Enlarged prostate without lower urinary tract symptoms: Secondary | ICD-10-CM | POA: Diagnosis not present

## 2023-06-01 DIAGNOSIS — R339 Retention of urine, unspecified: Secondary | ICD-10-CM | POA: Diagnosis not present

## 2023-06-01 NOTE — Progress Notes (Signed)
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 He travelled from [place] to [place] and after arriving in [place] he developed an inability to urinate. He presented to [facility] and required cystoscopy with urethral dilation. The foley has been in for 1 week. He was having gross hematuria which has cleared.    PMH: Past Medical History:  Diagnosis Date   Arthritis    Asthma    Coronary atherosclerosis of native coronary artery    a. DES x 3 RCA and DES mCx 10/11 - [place] - with residual 50-70% stenosis in the distal Cx in AV groove, 70% distal LAD beyond apex, 70% small D1. b. 12/2017: similar results with multivessel CAD. Patent stents. Medical management recommended.  c. s/p NSTEMI in 10/2020 with DES to prox-LAD   Diverticulitis    GERD (gastroesophageal reflux disease)    History of kidney stones    Hypertension    Mixed hyperlipidemia    Myocardial infarction [facility])    Obstructive sleep apnea    Pulmonary embolus [facility])    February 2022   Pulmonary hypertension (HCC)    PASP 50 mmHg   Restless leg syndrome    Sarcoidosis    Statin intolerance    Ulcerative colitis (HCC)     Surgical History: Past Surgical History:  Procedure Laterality Date   Ankle fracture repair Left 1985   APPENDECTOMY     CARDIAC CATHETERIZATION     stents x4   CHOLECYSTECTOMY  1970   CORONARY STENT INTERVENTION N/A 10/19/2020   Procedure: CORONARY STENT INTERVENTION;  Surgeon: Lennette Bihari, MD;  Location: [facility] IN[facility] CV [facility];  Service: Cardiovascular;  Laterality: N/A;   CYSTOSCOPY N/A 04/22/2021   Procedure: CYSTOSCOPY WITHE FIREFLY INJECTIONS;  Surgeon: Bjorn Pippin, MD;  Location: [facility] ORS;  Service: Urology;  Laterality: N/A;   CYSTOSCOPY WITH INSERTION OF UROLIFT N/A 08/27/2017    Procedure: CYSTOSCOPY WITH INSERTION OF UROLIFT;  Surgeon: Malen Gauze, MD;  Location: [facility] OR[facility];  Service: Urology;  Laterality: N/A;   JOINT REPLACEMENT     LAPAROSCOPIC APPENDECTOMY  2013   LEFT HEART CATH AND CORONARY ANGIOGRAPHY N/A 01/02/2018   Procedure: LEFT HEART CATH AND CORONARY ANGIOGRAPHY;  Surgeon: Lennette Bihari, MD;  Location: [facility] CV [facility];  Service: Cardiovascular;  Laterality: N/A;   LEFT HEART CATH AND CORONARY ANGIOGRAPHY N/A 10/19/2020   Procedure: LEFT HEART CATH AND CORONARY ANGIOGRAPHY;  Surgeon: Lennette Bihari, MD;  Location: [facility];  Service: Cardiovascular;  Laterality: N/A;   Right total knee replacement Bilateral 2005    Home Medications:  Allergies as of 06/01/2023       Reactions   Bee Venom Anaphylaxis   Statins Other (See Comments)   Myalgias - Simvastatin and Rosuvastatin        Medication List        Accurate as of June 01, 2023  9:14 AM. If you have any questions, ask your nurse or doctor.          acetaminophen 500 MG tablet Commonly known as: TYLENOL Take 1,000 mg by mouth 2 (two) times daily as needed for moderate pain.   albuterol 108 (90 Base) MCG/ACT inhaler Commonly known as: VENTOLIN HFA SMARTSIG:2 Puff(s) By Mouth Every 4-6 Hours PRN   aspirin EC 81  MG tablet Take 81 mg by mouth daily.   Breo Ellipta 200-25 MCG/INH Aepb Generic drug: fluticasone furoate-vilanterol Inhale 1 Inhaler into the lungs daily.   carbidopa-levodopa 10-100 MG tablet Commonly known as: SINEMET IR Take 1 tablet by mouth at bedtime.   cyanocobalamin 1000 MCG/ML injection Commonly known as: VITAMIN B12 Inject 1,000 mcg into the muscle every 30 (thirty) days.   Dupilumab 300 MG/2ML Sopn Inject 300 mg into the skin every 14 (fourteen) days.   finasteride 5 MG tablet Commonly known as: PROSCAR Take 1 tablet (5 mg total) by mouth daily.   folic acid 1 MG tablet Commonly known as: FOLVITE Take 1 mg by mouth See admin  instructions. Mon - Fri   HYDROcodone-acetaminophen 5-325 MG tablet Commonly known as: NORCO/VICODIN Take 1-2 tablets by mouth every 6 (six) hours as needed for severe pain.   losartan 50 MG tablet Commonly known as: COZAAR Take 1 tablet (50 mg total) by mouth daily.   metoprolol tartrate 25 MG tablet Commonly known as: LOPRESSOR TAKE ONE-HALF TABLET BY MOUTH  TWICE DAILY   montelukast 10 MG tablet Commonly known as: SINGULAIR Take 10 mg by mouth at bedtime.   omeprazole 20 MG capsule Commonly known as: PRILOSEC Take 20 mg by mouth daily.   oxymetazoline 0.05 % nasal spray Commonly known as: AFRIN Place 1 spray into both nostrils 2 (two) times daily as needed for congestion.   Repatha SureClick 140 MG/ML Soaj Generic drug: Evolocumab INJECT 1 DOSE INTO THE SKIN EVERY 14 DAYS   sulfaSALAzine 500 MG tablet Commonly known as: AZULFIDINE Take 1,000 mg by mouth 2 (two) times daily.   tadalafil 5 MG tablet Commonly known as: CIALIS TAKE 1 TABLET BY MOUTH ONCE DAILY   tadalafil 20 MG tablet Commonly known as: CIALIS TAKE 1 TABLET BY MOUTH AS NEEDED   tiZANidine 4 MG tablet Commonly known as: ZANAFLEX Take 4 mg by mouth at bedtime.   Vitamin D (Ergocalciferol) 1.25 MG (50000 UNIT) Caps capsule Commonly known as: DRISDOL Take 50,000 Units by mouth every 7 (seven) days. Thursdays        Allergies:  Allergies  Allergen Reactions   Bee Venom Anaphylaxis   Statins Other (See Comments)    Myalgias - Simvastatin and Rosuvastatin    Family History: Family History  Problem Relation Age of Onset   Colon cancer Mother    CAD Father     Social History:  reports that he has never smoked. He has never used smokeless tobacco. He reports that he does not drink alcohol and does not use drugs.  ROS: All other review of systems were reviewed and are negative except what is noted above in HPI  Physical Exam: BP (!) 174/79   Pulse (!) 56   Ht 6' (1.829 m)   Wt 216 lb  (98 kg)   BMI 29.29 kg/m   Constitutional:  Alert and oriented, No acute distress. HEENT: [place] AT, moist mucus membranes.  Trachea midline, no masses. Cardiovascular: No clubbing, cyanosis, or edema. Respiratory: Normal respiratory effort, no increased work of breathing. GI: Abdomen is soft, nontender, nondistended, no abdominal masses GU: No CVA tenderness.  Lymph: No cervical or inguinal lymphadenopathy. Skin: No rashes, bruises or suspicious lesions. Neurologic: Grossly intact, no focal deficits, moving all 4 extremities. Psychiatric: Normal mood and affect.  Laboratory Data: Lab Results  Component Value Date   WBC 10.4 04/25/2021   HGB 8.2 (L) 04/25/2021   HCT 26.4 (L) 04/25/2021   MCV  89.8 04/25/2021   PLT 205 04/25/2021    Lab Results  Component Value Date   CREATININE 0.87 04/25/2021    No results found for: "PSA"  No results found for: "TESTOSTERONE"  Lab Results  Component Value Date   HGBA1C 4.9 04/11/2021    Urinalysis    Component Value Date/Time   COLORURINE AMBER (A) 01/24/2021 0957   APPEARANCEUR Clear 12/27/2021 1027   LABSPEC 1.004 (L) 01/24/2021 0957   PHURINE 7.0 01/24/2021 0957   GLUCOSEU Negative 12/27/2021 1027   HGBUR LARGE (A) 01/24/2021 0957   BILIRUBINUR Negative 12/27/2021 1027   KETONESUR NEGATIVE 01/24/2021 0957   PROTEINUR Negative 12/27/2021 1027   PROTEINUR NEGATIVE 01/24/2021 0957   NITRITE Negative 12/27/2021 1027   NITRITE NEGATIVE 01/24/2021 0957   LEUKOCYTESUR Negative 12/27/2021 1027   LEUKOCYTESUR LARGE (A) 01/24/2021 0957    Lab Results  Component Value Date   LABMICR Comment 09/30/2021   WBCUA None seen 09/23/2021   LABEPIT None seen 09/23/2021   MUCUS Present 05/16/2021   BACTERIA None seen 09/23/2021    Pertinent Imaging:  No results found for this or any previous visit.  No results found for this or any previous visit.  No results found for this or any previous visit.  No results found for this or any  previous visit.  No results found for this or any previous visit.  No valid procedures specified. Results for orders placed during the hospital encounter of 04/09/20  CT HEMATURIA WORKUP  Narrative CLINICAL DATA:  Gross hematuria with lower abdominal and groin pain  EXAM: CT ABDOMEN AND PELVIS WITHOUT AND WITH CONTRAST  TECHNIQUE: Multidetector CT imaging of the abdomen and pelvis was performed following the standard protocol before and following the bolus administration of intravenous contrast.  CONTRAST:  OMNIPAQUE IOHEXOL 300 MG/ML  SOLN  COMPARISON:  Abdomen and pelvis CT from 2017  FINDINGS: Lower chest: 9 x 7 mm part solid pulmonary nodule (image 12, series 4) septal thickening at the lung bases as well with some subpleural reticulation. Concern in the LEFT lung base on the prior exam measuring approximately 5-6 mm.  Mild septal thickening also present at the RIGHT lung base with parenchymal scarring as slightly worse than the previous exam.  On prone imaging there is persistent subpleural reticulation and there is some nodularity that is more evident (image 41, series 15) 5 mm juxta diaphragmatic pulmonary nodule. No consolidation or sign of pleural effusion. Multiple small nodules are seen at the RIGHT lung base as well, for instance on image 14 of series 15 there are at least 5/6 additional small nodules, a 4 mm nodule in the RIGHT lung base serving as an example of multiple small nodules.  Hepatobiliary: Mildly nodular hepatic contours with fissural widening. Portal vein is patent liver contours are unchanged since 2017. No focal, suspicious hepatic lesion. Post cholecystectomy without gross biliary duct distension.  Pancreas: Pancreas without focal lesion or ductal dilation. No signs of peripancreatic inflammation.  Spleen: Spleen normal in size and contour with no focal lesion. Small adjacent splenule.  Perisplenic collaterals in the LEFT  hemiabdomen. Splenic vein remains patent as does the SMV.  Adrenals/Urinary Tract: Adrenal glands are normal.  Bilateral renal cysts, largest on the RIGHT approximately 4.5 x 3.4 cm. No hydronephrosis. No ureteral calculus.  Small lower pole calculus on the LEFT. This measures approximately 3 mm.  Mild perivesical stranding. Bladder wall thickening with adjacent diverticular inflammation  Large inflamed diverticulum versus small pericolonic abscess  from diverticulitis sits atop the urinary bladder. (Image 71 series 7) and image 52, series 11) there is colonic thickening as well in this area.  No sign of upper tract lesion.  Stomach/Bowel: Small hiatal hernia. No signs of small bowel obstruction or acute small bowel process. Post appendectomy.  Segmental colonic thickening more pronounced adjacent to pericolonic fluid and gas that abuts the urinary bladder and tracks along the dome of the bladder. This area measures approximately 2.3 x 1.9 cm. No gas in the urinary bladder at this time.  Extending superiorly from the sigmoid colon is a gas containing tract best seen on image 71 of series 12. There is a diverticulum in this area on the previous study but this appears more pronounced and is associated with fluid density that extends superiorly from the colon, adjacent to a wide-mouth diverticulum. There is no pericolonic inflammation in this location. The area extending from the gas containing tract abuts small bowel loops measuring approximately 2.1 x 1.4 cm.  Vascular/Lymphatic: Calcified atheromatous plaque in the abdominal aorta, no aneurysm. No adenopathy in the retroperitoneum. Small lymph nodes in the upper abdomen are unchanged.  No pelvic lymphadenopathy.  Reproductive: Post uro lift procedure.  Post RIGHT orchiectomy.  Other: No free air.  No ascites.  Musculoskeletal: No acute musculoskeletal process. No destructive bone finding. Spinal degenerative  changes.  IMPRESSION: 1. Pericolonic abscess likely related to diverticulitis between the sigmoid and the urinary bladder with thickening of the wall of the urinary bladder, likely secondary cystitis. No current signs of colovesical fistula though with this appearance fistula formation is a risk. 2. Eccentric thickening of the inferior wall of the colon is noted, potentially related to more pronounced inflammation in this location would however suggest follow-up colonoscopy to exclude underlying colonic lesion. 3. Small gas containing tract extending superiorly from the sigmoid slightly downstream from this area of abnormality was present previously but appears slightly larger than on the prior study. Significance uncertain. Potentially related to prior diverticulitis. 4. No free air or ascites. 5. No signs of upper tract lesion with renal cortical scarring, nephrolithiasis and renal cysts. 6. 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 Correlate with any clinical or laboratory evidence of liver disease. 7. Multiple small pulmonary nodules at the lung bases with septal thickening and subpleural reticulation. Correlate with any risk factors for aspiration or history of chronic infection. Given the 8 mm mean diameter pulmonary nodule which has enlarged since prior imaging consider dedicated chest CT and/or comparison with prior evaluations. Based on spiculated appearance scarring or bronchogenic neoplasm could have this appearance. 8. Aortic atherosclerosis.  Aortic Atherosclerosis (ICD10-I70.0).   Electronically Signed By: Donzetta Kohut M.D. On: 04/10/2020 16:57  No results found for this or any previous visit.   Assessment & Plan:    1. Benign prostatic hyperplasia, unspecified whether lower urinary tract symptoms present Voiding trial passed today.   2. Urinary retention Voiding trial passed today   No follow-ups on file.  Wilkie Aye, MD  [facility] [place]

## 2023-06-01 NOTE — Progress Notes (Signed)
Fill and Pull Catheter Removal  Patient is present today for a catheter removal.  Patient was cleaned and prepped in a sterile fashion of sterile water/ saline was instilled into the bladder when the patient felt the urge to urinate. 10ml of water was then drained from the balloon.  A 18FR silicone foley cath was removed from the bladder no complications were noted .  Patient as then given some time to void on their own.  Patient can void  on their own after some time.  Patient tolerated well.  Performed by: Guss Bunde, CMA  Follow up/ Additional notes: MD to see after

## 2023-06-05 ENCOUNTER — Encounter: Payer: Self-pay | Admitting: Urology

## 2023-06-05 NOTE — Patient Instructions (Signed)
Urethral Stricture  Urethral stricture is when the tube that drains pee (urine) from the bladder out of the body (urethra) becomes too narrow. The urethra can become narrow because of scar tissue, infection, surgery, or an injury. This can make it difficult to pee (urinate). In females, the urethra opens above the vaginal opening. In males, the urethra opens at the tip of the penis, and the urethra is much longer than it is in females. Because of the length of the male urethra, urethral stricture is much more common in males. What are the causes? In males and females, common causes of urethral stricture include: Urinary tract infection (UTI). Sexually transmitted infection (STI). Using a soft tube in the urethra to drain pee from the bladder (urinary catheter). Urinary tract surgery. In males, common causes of urethral stricture include: A severe injury to the pelvis. Prostate surgery. Injury to the penis. In many cases, the cause of urethral stricture is not known. What increases the risk? You are more likely to develop this condition if you: Are male. Males who have had prostate surgery are at risk of developing this condition. Use a urinary catheter. Have had urinary tract surgery. What are the signs or symptoms? The main symptom of this condition is trouble peeing. This may cause decreased pee flow, dribbling, or spraying of pee. Other symptom of this condition may include: Frequent UTIs. Blood in the pee. Pain when peeing. Swelling of the penis in males. Not being able to pee. How is this diagnosed? This condition may be diagnosed based on: Your medical history and a physical exam. Tests of your pee to check for infection or bleeding. X-rays. Ultrasound. Retrograde urethrogram. With this test, a dye is injected into the urethra and then an X-ray is taken. Urethroscopy. This is when a thin tube with a light and camera on the end (urethroscope) is used to look at the urethra. A  CT scan or MRI. How is this treated? This condition is treated with surgery or other procedures. The type of surgery that you have depends on the severity of your condition. You may have: Urethral dilation. In this procedure, the narrow part of the urethra is stretched open (dilated) with dilating instruments or a small balloon. Urethrotomy. In this procedure, a urethroscope is placed into the urethra, and the narrow part of the urethra is cut open with a surgical blade or laser inserted through the urethroscope. Urethroplasty. In this procedure, an incision is made in the urethra and the narrow part is removed. Then, the urethra is reconstructed. Follow these instructions at home: Take over-the-counter and prescription medicines only as told by your health care provider. If you were prescribed antibiotics, take them as told by your provider. Do not stop using the antibiotic even if you start to feel better. Drink enough fluid to keep your pee pale yellow. Keep all follow-up visits. Your provider will check your healing and adjust your treatment plan as needed. Contact a health care provider if: You have frequent peeing or you are only peeing small amounts often. You feel the need to pee urgently. You have pain or burning when you pee. Your pee smells bad or unusual. Your pee is bloody or cloudy. You have pain in your lower abdomen or back. Your genital area is swollen, bruised, or discolored. This includes: The penis, scrotum, and inner thighs for males. The outer genital organs (vulva) and inner thighs for females. You have a fever. You develop swelling in your legs. Get help  right away if: You cannot pee. You have trouble breathing. These symptoms may be an emergency. Get help right away. Call 911. Do not wait to see if the symptoms will go away. Do not drive yourself to the hospital. This information is not intended to replace advice given to you by your health care provider. Make  sure you discuss any questions you have with your health care provider. Document Revised: 06/01/2022 Document Reviewed: 06/01/2022 Elsevier Patient Education  2024 [employer].

## 2023-10-16 ENCOUNTER — Ambulatory Visit: Payer: Medicare Other | Admitting: Internal Medicine

## 2023-11-02 ENCOUNTER — Other Ambulatory Visit: Payer: Self-pay | Admitting: Cardiology

## 2024-01-07 ENCOUNTER — Other Ambulatory Visit: Payer: Self-pay | Admitting: Cardiology
